# Patient Record
Sex: Male | Born: 1935
Health system: Southern US, Community
[De-identification: ages and names within clinical notes are randomized; demographics above are authoritative.]

## PROBLEM LIST (undated history)

## (undated) DIAGNOSIS — I779 Disorder of arteries and arterioles, unspecified: Secondary | ICD-10-CM

## (undated) DIAGNOSIS — S72009A Fracture of unspecified part of neck of unspecified femur, initial encounter for closed fracture: Secondary | ICD-10-CM

## (undated) DIAGNOSIS — I459 Conduction disorder, unspecified: Secondary | ICD-10-CM

## (undated) DIAGNOSIS — I340 Nonrheumatic mitral (valve) insufficiency: Secondary | ICD-10-CM

## (undated) DIAGNOSIS — I472 Ventricular tachycardia: Secondary | ICD-10-CM

## (undated) DIAGNOSIS — J449 Chronic obstructive pulmonary disease, unspecified: Secondary | ICD-10-CM

## (undated) DIAGNOSIS — R600 Localized edema: Secondary | ICD-10-CM

## (undated) DIAGNOSIS — K922 Gastrointestinal hemorrhage, unspecified: Secondary | ICD-10-CM

## (undated) DIAGNOSIS — I739 Peripheral vascular disease, unspecified: Secondary | ICD-10-CM

## (undated) DIAGNOSIS — D696 Thrombocytopenia, unspecified: Secondary | ICD-10-CM

## (undated) DIAGNOSIS — I4892 Unspecified atrial flutter: Secondary | ICD-10-CM

## (undated) DIAGNOSIS — I10 Essential (primary) hypertension: Secondary | ICD-10-CM

## (undated) DIAGNOSIS — F1010 Alcohol abuse, uncomplicated: Secondary | ICD-10-CM

## (undated) DIAGNOSIS — K219 Gastro-esophageal reflux disease without esophagitis: Secondary | ICD-10-CM

## (undated) DIAGNOSIS — R578 Other shock: Secondary | ICD-10-CM

## (undated) DIAGNOSIS — E875 Hyperkalemia: Secondary | ICD-10-CM

## (undated) DIAGNOSIS — I429 Cardiomyopathy, unspecified: Secondary | ICD-10-CM

## (undated) DIAGNOSIS — B351 Tinea unguium: Secondary | ICD-10-CM

## (undated) DIAGNOSIS — I2510 Atherosclerotic heart disease of native coronary artery without angina pectoris: Secondary | ICD-10-CM

## (undated) DIAGNOSIS — Q809 Congenital ichthyosis, unspecified: Secondary | ICD-10-CM

## (undated) DIAGNOSIS — R591 Generalized enlarged lymph nodes: Secondary | ICD-10-CM

## (undated) DIAGNOSIS — K8050 Calculus of bile duct without cholangitis or cholecystitis without obstruction: Secondary | ICD-10-CM

## (undated) DIAGNOSIS — N1832 Chronic kidney disease, stage 3b: Secondary | ICD-10-CM

## (undated) DIAGNOSIS — Z8719 Personal history of other diseases of the digestive system: Secondary | ICD-10-CM

## (undated) DIAGNOSIS — C61 Malignant neoplasm of prostate: Secondary | ICD-10-CM

## (undated) DIAGNOSIS — I4891 Unspecified atrial fibrillation: Secondary | ICD-10-CM

## (undated) DIAGNOSIS — I350 Nonrheumatic aortic (valve) stenosis: Secondary | ICD-10-CM

## (undated) DIAGNOSIS — I5042 Chronic combined systolic (congestive) and diastolic (congestive) heart failure: Secondary | ICD-10-CM

## (undated) DIAGNOSIS — M7020 Olecranon bursitis, unspecified elbow: Secondary | ICD-10-CM

## (undated) DIAGNOSIS — S72002A Fracture of unspecified part of neck of left femur, initial encounter for closed fracture: Secondary | ICD-10-CM

## (undated) DIAGNOSIS — Z9889 Other specified postprocedural states: Secondary | ICD-10-CM

## (undated) DIAGNOSIS — E785 Hyperlipidemia, unspecified: Secondary | ICD-10-CM

## (undated) DIAGNOSIS — J189 Pneumonia, unspecified organism: Secondary | ICD-10-CM

## (undated) DIAGNOSIS — I639 Cerebral infarction, unspecified: Secondary | ICD-10-CM

## (undated) HISTORY — DX: Atherosclerotic heart disease of native coronary artery without angina pectoris: I25.10

## (undated) HISTORY — DX: Chronic obstructive pulmonary disease, unspecified: J44.9

## (undated) HISTORY — DX: Hypocalcemia: E83.51

## (undated) HISTORY — DX: Calculus of bile duct without cholangitis or cholecystitis without obstruction: K80.50

## (undated) HISTORY — DX: Localized edema: R60.0

## (undated) HISTORY — DX: Chronic kidney disease, stage 3b: N18.32

## (undated) HISTORY — DX: Essential (primary) hypertension: I10

## (undated) HISTORY — DX: Personal history of other diseases of the digestive system: Z87.19

## (undated) HISTORY — DX: Nonrheumatic mitral (valve) insufficiency: I34.0

## (undated) HISTORY — DX: Other specified postprocedural states: Z98.890

## (undated) HISTORY — DX: Peripheral vascular disease, unspecified: I73.9

## (undated) HISTORY — DX: Congenital ichthyosis, unspecified: Q80.9

## (undated) HISTORY — DX: Generalized enlarged lymph nodes: R59.1

## (undated) HISTORY — DX: Malignant neoplasm of prostate: C61

## (undated) HISTORY — DX: Disorder of arteries and arterioles, unspecified: I77.9

## (undated) HISTORY — DX: Olecranon bursitis, unspecified elbow: M70.20

## (undated) HISTORY — DX: Nonrheumatic aortic (valve) stenosis: I35.0

## (undated) HISTORY — PX: COLONOSCOPY: SHX174

## (undated) HISTORY — DX: Tinea unguium: B35.1

## (undated) HISTORY — DX: Hyperkalemia: E87.5

## (undated) HISTORY — DX: Hyperlipidemia, unspecified: E78.5

---

## 1998-12-07 HISTORY — PX: HEMORRHOID SURGERY: SHX153

## 2001-12-07 DIAGNOSIS — Z9889 Other specified postprocedural states: Secondary | ICD-10-CM

## 2001-12-07 HISTORY — DX: Other specified postprocedural states: Z98.890

## 2001-12-07 HISTORY — PX: LUNG SURGERY: SHX703

## 2008-10-10 ENCOUNTER — Ambulatory Visit (HOSPITAL_COMMUNITY): Admission: RE | Admit: 2008-10-10 | Discharge: 2008-10-10 | Payer: Self-pay | Admitting: Urology

## 2008-10-23 ENCOUNTER — Ambulatory Visit: Admission: RE | Admit: 2008-10-23 | Discharge: 2008-11-12 | Payer: Self-pay | Admitting: Radiation Oncology

## 2009-04-01 ENCOUNTER — Encounter: Admission: RE | Admit: 2009-04-01 | Discharge: 2009-04-01 | Payer: Self-pay | Admitting: Family Medicine

## 2009-04-11 ENCOUNTER — Encounter: Payer: Self-pay | Admitting: Cardiovascular Disease

## 2010-01-17 ENCOUNTER — Encounter (HOSPITAL_COMMUNITY): Admission: RE | Admit: 2010-01-17 | Discharge: 2010-03-20 | Payer: Self-pay | Admitting: Urology

## 2010-10-02 ENCOUNTER — Encounter: Admission: RE | Admit: 2010-10-02 | Discharge: 2010-10-02 | Payer: Self-pay | Admitting: Family Medicine

## 2011-02-18 ENCOUNTER — Encounter: Payer: Self-pay | Admitting: Cardiovascular Disease

## 2011-02-18 DIAGNOSIS — I739 Peripheral vascular disease, unspecified: Secondary | ICD-10-CM | POA: Insufficient documentation

## 2011-02-18 DIAGNOSIS — R0989 Other specified symptoms and signs involving the circulatory and respiratory systems: Secondary | ICD-10-CM | POA: Insufficient documentation

## 2011-02-18 HISTORY — DX: Peripheral vascular disease, unspecified: I73.9

## 2011-02-19 ENCOUNTER — Other Ambulatory Visit (HOSPITAL_COMMUNITY): Payer: Self-pay

## 2011-02-24 NOTE — Miscellaneous (Signed)
Summary: Orders Update  Clinical Lists Changes  Problems: Added new problem of OTHER SYMPTOMS INVOLVING CARDIOVASCULAR SYSTEM (ICD-785.9) Orders: Added new Test order of Carotid Duplex (Carotid Duplex) - Signed 

## 2011-02-24 NOTE — Miscellaneous (Signed)
Summary: Orders Update  Clinical Lists Changes  Problems: Added new problem of UNSPECIFIED PERIPHERAL VASCULAR DISEASE (ICD-443.9) Orders: Added new Test order of Arterial Duplex Lower Extremity (Arterial Duplex Low) - Signed 

## 2011-03-06 ENCOUNTER — Other Ambulatory Visit: Payer: Self-pay | Admitting: Family Medicine

## 2011-03-06 ENCOUNTER — Other Ambulatory Visit (HOSPITAL_COMMUNITY): Payer: Self-pay

## 2011-03-06 DIAGNOSIS — I2510 Atherosclerotic heart disease of native coronary artery without angina pectoris: Secondary | ICD-10-CM

## 2011-03-09 ENCOUNTER — Other Ambulatory Visit: Payer: Self-pay | Admitting: Cardiology

## 2011-03-09 ENCOUNTER — Encounter (INDEPENDENT_AMBULATORY_CARE_PROVIDER_SITE_OTHER): Payer: Medicare Other | Admitting: Cardiology

## 2011-03-09 ENCOUNTER — Ambulatory Visit (HOSPITAL_COMMUNITY): Payer: Medicare Other | Attending: Family Medicine

## 2011-03-09 DIAGNOSIS — I2510 Atherosclerotic heart disease of native coronary artery without angina pectoris: Secondary | ICD-10-CM | POA: Insufficient documentation

## 2011-03-09 DIAGNOSIS — R609 Edema, unspecified: Secondary | ICD-10-CM

## 2011-03-09 DIAGNOSIS — I6529 Occlusion and stenosis of unspecified carotid artery: Secondary | ICD-10-CM

## 2011-03-09 DIAGNOSIS — R0989 Other specified symptoms and signs involving the circulatory and respiratory systems: Secondary | ICD-10-CM

## 2011-03-09 DIAGNOSIS — I739 Peripheral vascular disease, unspecified: Secondary | ICD-10-CM

## 2011-03-09 DIAGNOSIS — M79609 Pain in unspecified limb: Secondary | ICD-10-CM

## 2011-03-16 ENCOUNTER — Encounter: Payer: Self-pay | Admitting: Family Medicine

## 2011-04-07 ENCOUNTER — Encounter: Payer: Self-pay | Admitting: Cardiovascular Disease

## 2011-04-07 DIAGNOSIS — B351 Tinea unguium: Secondary | ICD-10-CM | POA: Insufficient documentation

## 2011-04-07 DIAGNOSIS — Z8719 Personal history of other diseases of the digestive system: Secondary | ICD-10-CM | POA: Insufficient documentation

## 2011-04-07 DIAGNOSIS — M7020 Olecranon bursitis, unspecified elbow: Secondary | ICD-10-CM | POA: Insufficient documentation

## 2011-04-07 DIAGNOSIS — E875 Hyperkalemia: Secondary | ICD-10-CM | POA: Insufficient documentation

## 2011-04-07 DIAGNOSIS — C61 Malignant neoplasm of prostate: Secondary | ICD-10-CM | POA: Insufficient documentation

## 2011-04-07 DIAGNOSIS — I10 Essential (primary) hypertension: Secondary | ICD-10-CM | POA: Insufficient documentation

## 2011-04-07 DIAGNOSIS — E785 Hyperlipidemia, unspecified: Secondary | ICD-10-CM | POA: Insufficient documentation

## 2011-04-07 DIAGNOSIS — I340 Nonrheumatic mitral (valve) insufficiency: Secondary | ICD-10-CM | POA: Insufficient documentation

## 2011-04-07 DIAGNOSIS — J449 Chronic obstructive pulmonary disease, unspecified: Secondary | ICD-10-CM | POA: Insufficient documentation

## 2011-04-07 DIAGNOSIS — R600 Localized edema: Secondary | ICD-10-CM | POA: Insufficient documentation

## 2011-04-07 DIAGNOSIS — R188 Other ascites: Secondary | ICD-10-CM | POA: Insufficient documentation

## 2011-04-07 DIAGNOSIS — I2510 Atherosclerotic heart disease of native coronary artery without angina pectoris: Secondary | ICD-10-CM | POA: Insufficient documentation

## 2011-04-07 DIAGNOSIS — R591 Generalized enlarged lymph nodes: Secondary | ICD-10-CM | POA: Insufficient documentation

## 2011-04-07 DIAGNOSIS — Q809 Congenital ichthyosis, unspecified: Secondary | ICD-10-CM | POA: Insufficient documentation

## 2011-04-07 DIAGNOSIS — K8050 Calculus of bile duct without cholangitis or cholecystitis without obstruction: Secondary | ICD-10-CM | POA: Insufficient documentation

## 2011-04-07 DIAGNOSIS — E559 Vitamin D deficiency, unspecified: Secondary | ICD-10-CM | POA: Insufficient documentation

## 2011-04-07 DIAGNOSIS — I350 Nonrheumatic aortic (valve) stenosis: Secondary | ICD-10-CM | POA: Insufficient documentation

## 2011-04-09 ENCOUNTER — Other Ambulatory Visit: Payer: Self-pay | Admitting: Ophthalmology

## 2011-04-09 DIAGNOSIS — H5712 Ocular pain, left eye: Secondary | ICD-10-CM

## 2011-04-14 ENCOUNTER — Ambulatory Visit: Payer: Medicare Other | Admitting: Cardiovascular Disease

## 2011-04-15 ENCOUNTER — Other Ambulatory Visit: Payer: Medicare Other

## 2011-04-21 ENCOUNTER — Other Ambulatory Visit: Payer: Medicare Other

## 2011-04-30 ENCOUNTER — Ambulatory Visit
Admission: RE | Admit: 2011-04-30 | Discharge: 2011-04-30 | Disposition: A | Discharge status: Home / Self Care | Payer: Medicare Other | Source: Ambulatory Visit | Attending: Ophthalmology | Admitting: Ophthalmology

## 2011-04-30 DIAGNOSIS — H5712 Ocular pain, left eye: Secondary | ICD-10-CM

## 2011-04-30 MED ORDER — GADOBENATE DIMEGLUMINE 529 MG/ML IV SOLN
20.0000 mL | Freq: Once | INTRAVENOUS | Status: AC | PRN
  Administered 2011-04-30: 20 mL via INTRAVENOUS

## 2011-05-05 ENCOUNTER — Encounter: Payer: Self-pay | Admitting: Cardiovascular Disease

## 2011-05-05 ENCOUNTER — Ambulatory Visit (INDEPENDENT_AMBULATORY_CARE_PROVIDER_SITE_OTHER): Payer: Medicare Other | Admitting: Cardiovascular Disease

## 2011-05-05 VITALS — BP 112/76 | HR 69 | Ht 72.0 in | Wt 199.6 lb

## 2011-05-05 DIAGNOSIS — R609 Edema, unspecified: Secondary | ICD-10-CM

## 2011-05-05 DIAGNOSIS — I10 Essential (primary) hypertension: Secondary | ICD-10-CM

## 2011-05-05 DIAGNOSIS — R600 Localized edema: Secondary | ICD-10-CM

## 2011-05-05 NOTE — Progress Notes (Signed)
Matthew Mahoney Date of Birth  24-Oct-1936 [facility] / [facility] [address]     [address] [place], [place]  [address] [phone]  Fax  [phone]  History of Present Illness:  Elderly man with history of HTN, smoking, prostate cancer.  Recently developed leg swelling.  Stopped Amoodipine and leg swelling has improved.  He recently had what sounds like an echocardiogram although I am not able to find any results in the computer. He has a history of diastolic dysfunction. He denies any syncope or presyncope.  Current Outpatient Prescriptions on File Prior to Visit  Medication Sig Dispense Refill  . mineral oil-hydrophilic petrolatum (AQUAPHOR) ointment Apply 1 application topically as needed.        . tiotropium (SPIRIVA) 18 MCG inhalation capsule Place 18 mcg into inhaler and inhale every other day.       Marland Kitchen DISCONTD: furosemide (LASIX) 20 MG tablet Take 10 mg by mouth daily.        Marland Kitchen DISCONTD: metoprolol (TOPROL-XL) 50 MG 24 hr tablet Take 50 mg by mouth daily.       Marland Kitchen DISCONTD: olmesartan-hydrochlorothiazide (BENICAR HCT) 40-25 MG per tablet Take 1 tablet by mouth daily.         No Known Allergies  Past Medical History  Diagnosis Date  . Hypertension   . Edema of leg   . COPD (chronic obstructive pulmonary disease)   . Choledocholithiasis   . Coronary arteriosclerosis   . Hyperlipidemia   . Ichthyosis   . Mitral regurgitation   . Onychomycosis   . Vitamin D deficiency   . Aortic stenosis   . Hyperkalemia   . Hypocalcemia   . Lymphadenopathy   . Prostate cancer   . Status post lung surgery 12/07/2001    BENIGHN LESION ON RIGHT LUNG REMOVED  . Ascites     HISTORY  . History of colonic diverticulitis   . Olecranon bursitis     Past Surgical History  Procedure Date  . Hemorrhoid surgery 12/07/1998  . Lung surgery 12/07/2001    BENIGN LESION ON RIGHT LUNG REMOVED  . Colonoscopy     History  Smoking status  . Current Everyday Smoker --  1.5 packs/day  Smokeless tobacco  . Not on file    History  Alcohol Use  . Yes    Family History  Problem Relation Age of Onset  . Hypertension Mother 29  . Hypertension Father 4  . Hypertension Sister   . Hypertension Brother   . Hypertension Sister   . Hypertension Brother   . Hypertension Brother   . Hypertension Brother   . Hypertension Brother     Reviw of Systems:  Reviewed in the HPI.  All other systems are negative.  Physical Exam: BP 112/76  Pulse 69  Ht 6' (1.829 m)  Wt 199 lb 9.6 oz (90.538 kg)  BMI 27.07 kg/m2 The patient is alert and oriented x 3.  The mood and affect are normal.  The skin is warm and dry.  Color is normal.  The HEENT exam reveals that the sclera are nonicteric.  The mucous membranes are moist.  The carotids are 2+ without bruits.  There is no thyromegaly.  There is no JVD.  The lungs are clear.  The chest wall is non tender.  The heart exam reveals a regular rate with a normal S1 and S2.  There are no murmurs, gallops, or rubs.  The PMI is not displaced.  Abdominal exam reveals good bowel sounds.  There is no guarding or rebound.  There is no hepatosplenomegaly or tenderness.  There are no masses.  Exam of the legs reveals 1+ ankle edema.  There are chronic stasis changes.  The distal pulses are intact.  Cranial nerves II - XII are intact.  Motor and sensory functions are intact.  The gait is normal.  ECG: Normal sinus rhythm with first-degree AV block. He has an incomplete right bundle branch block with a left anterior fascicular block.  Assessment / Plan:

## 2011-05-05 NOTE — Assessment & Plan Note (Signed)
His blood pressure seems to be well-controlled. I think a tilt a lot better off amlodipine.  I suspect that he has some right-sided heart failure because of his use of cigarette smoking and COPD. We'll continue on his current medications.

## 2011-05-05 NOTE — Assessment & Plan Note (Signed)
We will try to locate The echocardiogram.

## 2011-05-05 NOTE — Patient Instructions (Signed)
Pt is OK to exercise at the [facility].

## 2013-01-20 ENCOUNTER — Emergency Department (HOSPITAL_COMMUNITY): Payer: Medicare Other

## 2013-01-20 ENCOUNTER — Inpatient Hospital Stay (HOSPITAL_COMMUNITY)
Admission: EM | Admit: 2013-01-20 | Discharge: 2013-01-24 | DRG: 481 | Disposition: A | Discharge status: Skilled Nursing Facility | Payer: Medicare Other | Attending: Internal Medicine | Admitting: Internal Medicine

## 2013-01-20 ENCOUNTER — Encounter (HOSPITAL_COMMUNITY): Payer: Self-pay | Admitting: Emergency Medicine

## 2013-01-20 DIAGNOSIS — I340 Nonrheumatic mitral (valve) insufficiency: Secondary | ICD-10-CM | POA: Diagnosis present

## 2013-01-20 DIAGNOSIS — Z79899 Other long term (current) drug therapy: Secondary | ICD-10-CM

## 2013-01-20 DIAGNOSIS — I2510 Atherosclerotic heart disease of native coronary artery without angina pectoris: Secondary | ICD-10-CM | POA: Diagnosis present

## 2013-01-20 DIAGNOSIS — W19XXXA Unspecified fall, initial encounter: Secondary | ICD-10-CM

## 2013-01-20 DIAGNOSIS — S72143A Displaced intertrochanteric fracture of unspecified femur, initial encounter for closed fracture: Principal | ICD-10-CM | POA: Diagnosis present

## 2013-01-20 DIAGNOSIS — Z791 Long term (current) use of non-steroidal anti-inflammatories (NSAID): Secondary | ICD-10-CM

## 2013-01-20 DIAGNOSIS — E559 Vitamin D deficiency, unspecified: Secondary | ICD-10-CM | POA: Diagnosis present

## 2013-01-20 DIAGNOSIS — E871 Hypo-osmolality and hyponatremia: Secondary | ICD-10-CM | POA: Diagnosis present

## 2013-01-20 DIAGNOSIS — D649 Anemia, unspecified: Secondary | ICD-10-CM | POA: Diagnosis not present

## 2013-01-20 DIAGNOSIS — I739 Peripheral vascular disease, unspecified: Secondary | ICD-10-CM | POA: Diagnosis present

## 2013-01-20 DIAGNOSIS — J4489 Other specified chronic obstructive pulmonary disease: Secondary | ICD-10-CM | POA: Diagnosis present

## 2013-01-20 DIAGNOSIS — Y92009 Unspecified place in unspecified non-institutional (private) residence as the place of occurrence of the external cause: Secondary | ICD-10-CM

## 2013-01-20 DIAGNOSIS — I10 Essential (primary) hypertension: Secondary | ICD-10-CM | POA: Diagnosis present

## 2013-01-20 DIAGNOSIS — F1010 Alcohol abuse, uncomplicated: Secondary | ICD-10-CM

## 2013-01-20 DIAGNOSIS — N400 Enlarged prostate without lower urinary tract symptoms: Secondary | ICD-10-CM | POA: Diagnosis present

## 2013-01-20 DIAGNOSIS — S72142A Displaced intertrochanteric fracture of left femur, initial encounter for closed fracture: Secondary | ICD-10-CM

## 2013-01-20 DIAGNOSIS — J449 Chronic obstructive pulmonary disease, unspecified: Secondary | ICD-10-CM | POA: Diagnosis present

## 2013-01-20 DIAGNOSIS — Z720 Tobacco use: Secondary | ICD-10-CM

## 2013-01-20 DIAGNOSIS — W010XXA Fall on same level from slipping, tripping and stumbling without subsequent striking against object, initial encounter: Secondary | ICD-10-CM | POA: Diagnosis present

## 2013-01-20 DIAGNOSIS — S72002A Fracture of unspecified part of neck of left femur, initial encounter for closed fracture: Secondary | ICD-10-CM

## 2013-01-20 DIAGNOSIS — F17200 Nicotine dependence, unspecified, uncomplicated: Secondary | ICD-10-CM | POA: Diagnosis present

## 2013-01-20 DIAGNOSIS — I350 Nonrheumatic aortic (valve) stenosis: Secondary | ICD-10-CM | POA: Diagnosis present

## 2013-01-20 DIAGNOSIS — E785 Hyperlipidemia, unspecified: Secondary | ICD-10-CM | POA: Diagnosis present

## 2013-01-20 LAB — BASIC METABOLIC PANEL
CO2: 22 mEq/L (ref 19–32)
Calcium: 9.8 mg/dL (ref 8.4–10.5)
Glucose, Bld: 106 mg/dL — ABNORMAL HIGH (ref 70–99)
Potassium: 3.9 mEq/L (ref 3.5–5.1)
Sodium: 129 mEq/L — ABNORMAL LOW (ref 135–145)

## 2013-01-20 LAB — URINALYSIS, ROUTINE W REFLEX MICROSCOPIC
Glucose, UA: NEGATIVE mg/dL
Hgb urine dipstick: NEGATIVE
Specific Gravity, Urine: 1.009 (ref 1.005–1.030)

## 2013-01-20 LAB — CBC WITH DIFFERENTIAL/PLATELET
Eosinophils Relative: 1 % (ref 0–5)
HCT: 36.4 % — ABNORMAL LOW (ref 39.0–52.0)
Hemoglobin: 12.7 g/dL — ABNORMAL LOW (ref 13.0–17.0)
Lymphocytes Relative: 14 % (ref 12–46)
Lymphs Abs: 1.6 10*3/uL (ref 0.7–4.0)
MCV: 93.8 fL (ref 78.0–100.0)
Platelets: 245 10*3/uL (ref 150–400)
RBC: 3.88 MIL/uL — ABNORMAL LOW (ref 4.22–5.81)
WBC: 11.2 10*3/uL — ABNORMAL HIGH (ref 4.0–10.5)

## 2013-01-20 LAB — ABO/RH: ABO/RH(D): O POS

## 2013-01-20 LAB — TYPE AND SCREEN: Antibody Screen: NEGATIVE

## 2013-01-20 NOTE — ED Notes (Signed)
Pt was getting out of car and slipped on ice and landed on left hip. C/o left hip pain, denies pain elsewhere, hip pain only with weight bearing.  Pt taking Plavix, did not hit head.

## 2013-01-20 NOTE — ED Notes (Signed)
Bedside report received from previous RN 

## 2013-01-20 NOTE — ED Provider Notes (Signed)
History     CSN: [id number]  Arrival date & time 01/20/13  1844   First MD Initiated Contact with Patient 01/20/13 2023      Chief Complaint  Patient presents with  . Fall  . Hip Pain    (Consider location/radiation/quality/duration/timing/severity/associated sxs/prior treatment) Patient is a 77 y.o. male presenting with fall and hip pain. The history is provided by the patient.  Fall  Hip Pain  He slipped on ice and fell injuring his left thigh. He states it doesn't hurt if he doesn't move it but he rates his pain at 10/10 if she tries to move it her stand on it. He denies head, neck, back injury. However, he is on Plavix because of cardiac stents.  Past Medical History  Diagnosis Date  . Hypertension   . Edema of leg   . COPD (chronic obstructive pulmonary disease)   . Choledocholithiasis   . Coronary arteriosclerosis   . Hyperlipidemia   . Ichthyosis   . Mitral regurgitation   . Onychomycosis   . Vitamin D deficiency   . Aortic stenosis   . Hyperkalemia   . Hypocalcemia   . Lymphadenopathy   . Prostate cancer   . Status post lung surgery 12/07/2001    BENIGHN LESION ON RIGHT LUNG REMOVED  . Ascites     HISTORY  . History of colonic diverticulitis   . Olecranon bursitis     Past Surgical History  Procedure Laterality Date  . Hemorrhoid surgery  12/07/1998  . Lung surgery  12/07/2001    BENIGN LESION ON RIGHT LUNG REMOVED  . Colonoscopy      Family History  Problem Relation Age of Onset  . Hypertension Mother 68  . Hypertension Father 28  . Hypertension Sister   . Hypertension Brother   . Hypertension Sister   . Hypertension Brother   . Hypertension Brother   . Hypertension Brother   . Hypertension Brother     History  Substance Use Topics  . Smoking status: Current Every Day Smoker -- 1.50 packs/day  . Smokeless tobacco: Not on file  . Alcohol Use: Yes      Review of Systems  All other systems reviewed and are negative.    Allergies   Review of patient's allergies indicates no known allergies.  Home Medications   Current Outpatient Rx  Name  Route  Sig  Dispense  Refill  . bicalutamide (CASODEX) 50 MG tablet   Oral   Take 50 mg by mouth daily.         . clopidogrel (PLAVIX) 75 MG tablet   Oral   Take 75 mg by mouth daily.           . fish oil-omega-3 fatty acids 1000 MG capsule   Oral   Take 1 g by mouth 2 (two) times daily.         Marland Kitchen ibuprofen (ADVIL) 200 MG tablet   Oral   Take 200 mg by mouth daily.           . metoprolol succinate (TOPROL-XL) 25 MG 24 hr tablet   Oral   Take 25 mg by mouth daily.          Marland Kitchen olmesartan-hydrochlorothiazide (BENICAR HCT) 40-12.5 MG per tablet   Oral   Take 1 tablet by mouth daily.             BP 166/74  Pulse 60  Temp(Src) 98 F (36.7 C) (Oral)  Resp 18  SpO2  100%  Physical Exam  Nursing note and vitals reviewed.  77 year old male, resting comfortably and in no acute distress. Vital signs are significant for hypertension with blood pressure 166/74. Oxygen saturation is 100%, which is normal. Head is normocephalic and atraumatic. PERRLA, EOMI. Oropharynx is clear. Neck is nontender and supple without adenopathy or JVD. Back is nontender and there is no CVA tenderness. Lungs are clear without rales, wheezes, or rhonchi. Chest is nontender. Heart has regular rate and rhythm without murmur. Abdomen is soft, flat, nontender without masses or hepatosplenomegaly and peristalsis is normoactive. Extremities: Left leg is shortened and externally rotated. There is tenderness palpation of the left hip and marked pain with any movement of the left hip. Distal neurovascular exam is intact. Venous stasis changes with thickening of skin is present bilaterally. Skin is warm and dry without rash. Neurologic: Mental status is normal, cranial nerves are intact, there are no motor or sensory deficits.  ED Course  Procedures (including critical care time)  Results  for orders placed during the hospital encounter of 01/20/13  CBC WITH DIFFERENTIAL      Result Value Range   WBC 11.2 (*) 4.0 - 10.5 K/uL   RBC 3.88 (*) 4.22 - 5.81 MIL/uL   Hemoglobin 12.7 (*) 13.0 - 17.0 g/dL   HCT 11.9 (*) 14.7 - 82.9 %   MCV 93.8  78.0 - 100.0 fL   MCH 32.7  26.0 - 34.0 pg   MCHC 34.9  30.0 - 36.0 g/dL   RDW 56.2  13.0 - 86.5 %   Platelets 245  150 - 400 K/uL   Neutrophils Relative 76  43 - 77 %   Neutro Abs 8.5 (*) 1.7 - 7.7 K/uL   Lymphocytes Relative 14  12 - 46 %   Lymphs Abs 1.6  0.7 - 4.0 K/uL   Monocytes Relative 7  3 - 12 %   Monocytes Absolute 0.8  0.1 - 1.0 K/uL   Eosinophils Relative 1  0 - 5 %   Eosinophils Absolute 0.2  0.0 - 0.7 K/uL   Basophils Relative 1  0 - 1 %   Basophils Absolute 0.1  0.0 - 0.1 K/uL  BASIC METABOLIC PANEL      Result Value Range   Sodium 129 (*) 135 - 145 mEq/L   Potassium 3.9  3.5 - 5.1 mEq/L   Chloride 91 (*) 96 - 112 mEq/L   CO2 22  19 - 32 mEq/L   Glucose, Bld 106 (*) 70 - 99 mg/dL   BUN 25 (*) 6 - 23 mg/dL   Creatinine, Ser 7.84  0.50 - 1.35 mg/dL   Calcium 9.8  8.4 - 69.6 mg/dL   GFR calc non Af Amer 56 (*) >90 mL/min   GFR calc Af Amer 65 (*) >90 mL/min  URINALYSIS, ROUTINE W REFLEX MICROSCOPIC      Result Value Range   Color, Urine YELLOW  YELLOW   APPearance CLEAR  CLEAR   Specific Gravity, Urine 1.009  1.005 - 1.030   pH 6.5  5.0 - 8.0   Glucose, UA NEGATIVE  NEGATIVE mg/dL   Hgb urine dipstick NEGATIVE  NEGATIVE   Bilirubin Urine NEGATIVE  NEGATIVE   Ketones, ur TRACE (*) NEGATIVE mg/dL   Protein, ur NEGATIVE  NEGATIVE mg/dL   Urobilinogen, UA 0.2  0.0 - 1.0 mg/dL   Nitrite NEGATIVE  NEGATIVE   Leukocytes, UA NEGATIVE  NEGATIVE  TYPE AND SCREEN  Result Value Range   ABO/RH(D) O POS     Antibody Screen NEG     Sample Expiration 01/23/2013    ABO/RH      Result Value Range   ABO/RH(D) O POS     Dg Hip Complete Left  01/20/2013  *RADIOLOGY REPORT*  Clinical Data: Fall, left hip pain.  LEFT  HIP - COMPLETE 2+ VIEW  Comparison: None.  Findings: There is a left femoral intertrochanteric fracture.  No significant angulation.  Minimal displacement on the cross-table lateral view.  No subluxation or dislocation.  Moderate degenerative changes in the hips bilaterally.  IMPRESSION: Left femoral intertrochanteric fracture.   Original Report Authenticated By: Charlett Nose, M.D.    Ct Head Wo Contrast  01/20/2013  *RADIOLOGY REPORT*  Clinical Data: Status post fall.  CT HEAD WITHOUT CONTRAST  Technique:  Contiguous axial images were obtained from the base of the skull through the vertex without contrast.  Comparison: 04/30/2011  Findings: There is diffuse patchy low density throughout the subcortical and periventricular white matter consistent with chronic small vessel ischemic change.  There is prominence of the sulci and ventricles consistent with brain atrophy.  There is no evidence for acute brain infarct, hemorrhage or mass.  There is mild mucosal thickening involving the left maxillary sinus.  The mastoid air cells are clear.  The skull is intact.  IMPRESSION:  1.  No acute intracranial abnormalities. 2.  Small vessel ischemic disease and brain atrophy.   Original Report Authenticated By: Signa Kell, M.D.    Dg Chest Portable 1 View  01/20/2013  *RADIOLOGY REPORT*  Clinical Data: Fall, hip pain, hip fracture.  PORTABLE CHEST - 1 VIEW  Comparison: None.  Findings: The heart is mildly enlarged.  Densities in the lung bases bilaterally, right greater than left.  I suspect this represents scarring or atelectasis.  Recommend clinical correlation to exclude pneumonia.  No effusions.  No acute bony abnormality.  IMPRESSION: Bibasilar opacities, right greater than left, suspect atelectasis or scarring.  Recommend clinical correlation to exclude infection.   Original Report Authenticated By: Charlett Nose, M.D.     Images viewed by me.   Date: 01/20/2013  Rate: 68  Rhythm: normal sinus rhythm and  premature ventricular contractions (PVC)  QRS Axis: left  Intervals: PR prolonged  ST/T Wave abnormalities: normal  Conduction Disutrbances:first-degree A-V block , left anterior fascicular block and Incomplete right bundle-branch block  Narrative Interpretation: First degree AV block, incomplete right bundle-branch block, left anterior fascicular block, PVCs. No prior ECG available for comparison.  Old EKG Reviewed: none available     1. Intertrochanteric fracture of left hip   2. Hyponatremia       MDM  Fall with left hip injury. X-ray confirms intertrochanteric fracture. Screening labs will be obtained. Because of taking Plavix, CT of the head will be obtained.  Head CT is unremarkable. Case is discussed with Dr. Jerl Santos who states he'll see the patient in consultation and anticipates operating on him tomorrow afternoon. Case is discussed with Dr. Onalee Hua of [facility] who agrees to admit the patient.  Dione Booze, MD 01/21/13 [phone]

## 2013-01-21 ENCOUNTER — Inpatient Hospital Stay (HOSPITAL_COMMUNITY): Payer: Medicare Other

## 2013-01-21 ENCOUNTER — Inpatient Hospital Stay (HOSPITAL_COMMUNITY): Payer: Medicare Other | Admitting: Anesthesiology

## 2013-01-21 ENCOUNTER — Encounter (HOSPITAL_COMMUNITY): Payer: Self-pay | Admitting: Anesthesiology

## 2013-01-21 ENCOUNTER — Encounter (HOSPITAL_COMMUNITY)
Admission: EM | Disposition: A | Discharge status: Skilled Nursing Facility | Payer: Self-pay | Source: Home / Self Care | Attending: Internal Medicine

## 2013-01-21 DIAGNOSIS — Z720 Tobacco use: Secondary | ICD-10-CM | POA: Diagnosis present

## 2013-01-21 DIAGNOSIS — S72009A Fracture of unspecified part of neck of unspecified femur, initial encounter for closed fracture: Secondary | ICD-10-CM

## 2013-01-21 DIAGNOSIS — W19XXXA Unspecified fall, initial encounter: Secondary | ICD-10-CM

## 2013-01-21 DIAGNOSIS — S72143A Displaced intertrochanteric fracture of unspecified femur, initial encounter for closed fracture: Principal | ICD-10-CM

## 2013-01-21 DIAGNOSIS — J449 Chronic obstructive pulmonary disease, unspecified: Secondary | ICD-10-CM

## 2013-01-21 DIAGNOSIS — I2510 Atherosclerotic heart disease of native coronary artery without angina pectoris: Secondary | ICD-10-CM

## 2013-01-21 DIAGNOSIS — I359 Nonrheumatic aortic valve disorder, unspecified: Secondary | ICD-10-CM

## 2013-01-21 DIAGNOSIS — S72002A Fracture of unspecified part of neck of left femur, initial encounter for closed fracture: Secondary | ICD-10-CM

## 2013-01-21 DIAGNOSIS — F1010 Alcohol abuse, uncomplicated: Secondary | ICD-10-CM

## 2013-01-21 DIAGNOSIS — E871 Hypo-osmolality and hyponatremia: Secondary | ICD-10-CM | POA: Diagnosis present

## 2013-01-21 DIAGNOSIS — Y92009 Unspecified place in unspecified non-institutional (private) residence as the place of occurrence of the external cause: Secondary | ICD-10-CM

## 2013-01-21 HISTORY — PX: FEMUR IM NAIL: SHX1597

## 2013-01-21 HISTORY — DX: Alcohol abuse, uncomplicated: F10.10

## 2013-01-21 HISTORY — DX: Fracture of unspecified part of neck of left femur, initial encounter for closed fracture: S72.002A

## 2013-01-21 LAB — BASIC METABOLIC PANEL
BUN: 21 mg/dL (ref 6–23)
GFR calc Af Amer: 74 mL/min — ABNORMAL LOW (ref >90)
GFR calc non Af Amer: 63 mL/min — ABNORMAL LOW (ref >90)
Potassium: 4.3 mEq/L (ref 3.5–5.1)

## 2013-01-21 LAB — CBC
HCT: 35.1 % — ABNORMAL LOW (ref 39.0–52.0)
MCHC: 34.8 g/dL (ref 30.0–36.0)
Platelets: 229 10*3/uL (ref 150–400)
RDW: 13.4 % (ref 11.5–15.5)

## 2013-01-21 LAB — PROTIME-INR
INR: 0.87 (ref 0.00–1.49)
Prothrombin Time: 11.8 seconds (ref 11.6–15.2)

## 2013-01-21 LAB — HEPATIC FUNCTION PANEL
ALT: 10 U/L (ref 0–53)
Alkaline Phosphatase: 59 U/L (ref 39–117)
Bilirubin, Direct: 0.2 mg/dL (ref 0.0–0.3)
Indirect Bilirubin: 0.4 mg/dL (ref 0.3–0.9)

## 2013-01-21 LAB — SURGICAL PCR SCREEN
MRSA, PCR: INVALID — AB
Staphylococcus aureus: INVALID — AB

## 2013-01-21 LAB — SODIUM, URINE, RANDOM: Sodium, Ur: 165 mEq/L

## 2013-01-21 SURGERY — INSERTION, INTRAMEDULLARY ROD, FEMUR
Anesthesia: General | Site: Hip | Laterality: Left | Wound class: Clean

## 2013-01-21 MED ORDER — LORAZEPAM 1 MG PO TABS: 0.0000 mg | ORAL_TABLET | Freq: Four times a day (QID) | ORAL | Status: AC

## 2013-01-21 MED ORDER — ONDANSETRON HCL 4 MG/2ML IJ SOLN: 4.0000 mg | Freq: Four times a day (QID) | INTRAMUSCULAR | Status: DC | PRN

## 2013-01-21 MED ORDER — SODIUM CHLORIDE 0.9 % IV SOLN: INTRAVENOUS | Status: DC

## 2013-01-21 MED ORDER — ADULT MULTIVITAMIN W/MINERALS CH
1.0000 | ORAL_TABLET | Freq: Every day | ORAL | Status: DC
  Administered 2013-01-22 – 2013-01-24 (×3): 1 via ORAL
  Filled 2013-01-21 (×4): qty 1

## 2013-01-21 MED ORDER — FOLIC ACID 1 MG PO TABS
1.0000 mg | ORAL_TABLET | Freq: Every day | ORAL | Status: DC
  Administered 2013-01-22 – 2013-01-24 (×3): 1 mg via ORAL
  Filled 2013-01-21 (×4): qty 1

## 2013-01-21 MED ORDER — HYDROCODONE-ACETAMINOPHEN 5-325 MG PO TABS
1.0000 | ORAL_TABLET | Freq: Four times a day (QID) | ORAL | Status: DC | PRN
  Administered 2013-01-22 – 2013-01-24 (×2): 1 via ORAL
  Filled 2013-01-21 (×2): qty 1

## 2013-01-21 MED ORDER — DEXAMETHASONE SODIUM PHOSPHATE 10 MG/ML IJ SOLN
INTRAMUSCULAR | Status: DC | PRN
  Administered 2013-01-21: 10 mg via INTRAVENOUS

## 2013-01-21 MED ORDER — LORAZEPAM 1 MG PO TABS: 0.0000 mg | ORAL_TABLET | Freq: Two times a day (BID) | ORAL | Status: DC

## 2013-01-21 MED ORDER — POTASSIUM CHLORIDE IN NACL 20-0.9 MEQ/L-% IV SOLN
INTRAVENOUS | Status: DC
  Administered 2013-01-21: 02:00:00 via INTRAVENOUS
  Filled 2013-01-21: qty 1000

## 2013-01-21 MED ORDER — FENTANYL CITRATE 0.05 MG/ML IJ SOLN
INTRAMUSCULAR | Status: DC | PRN
  Administered 2013-01-21: 50 ug via INTRAVENOUS

## 2013-01-21 MED ORDER — PROPOFOL 10 MG/ML IV BOLUS
INTRAVENOUS | Status: DC | PRN
  Administered 2013-01-21: 150 mg via INTRAVENOUS

## 2013-01-21 MED ORDER — METOPROLOL SUCCINATE ER 25 MG PO TB24
25.0000 mg | ORAL_TABLET | Freq: Every day | ORAL | Status: DC
  Administered 2013-01-21 – 2013-01-24 (×4): 25 mg via ORAL
  Filled 2013-01-21 (×4): qty 1

## 2013-01-21 MED ORDER — 0.9 % SODIUM CHLORIDE (POUR BTL) OPTIME
TOPICAL | Status: DC | PRN
  Administered 2013-01-21: 1000 mL

## 2013-01-21 MED ORDER — MORPHINE SULFATE 2 MG/ML IJ SOLN: 0.5000 mg | INTRAMUSCULAR | Status: DC | PRN

## 2013-01-21 MED ORDER — HYDROMORPHONE HCL PF 1 MG/ML IJ SOLN: 0.2500 mg | INTRAMUSCULAR | Status: DC | PRN

## 2013-01-21 MED ORDER — GLYCOPYRROLATE 0.2 MG/ML IJ SOLN
INTRAMUSCULAR | Status: DC | PRN
  Administered 2013-01-21: 0.4 mg via INTRAVENOUS

## 2013-01-21 MED ORDER — EPHEDRINE SULFATE 50 MG/ML IJ SOLN
INTRAMUSCULAR | Status: DC | PRN
  Administered 2013-01-21: 10 mg via INTRAVENOUS

## 2013-01-21 MED ORDER — LORAZEPAM 2 MG/ML IJ SOLN: 1.0000 mg | Freq: Four times a day (QID) | INTRAMUSCULAR | Status: AC | PRN

## 2013-01-21 MED ORDER — CEFAZOLIN SODIUM-DEXTROSE 2-3 GM-% IV SOLR
2.0000 g | Freq: Four times a day (QID) | INTRAVENOUS | Status: AC
Start: 2013-01-22 — End: 2013-01-22
  Administered 2013-01-21 – 2013-01-22 (×2): 2 g via INTRAVENOUS
  Filled 2013-01-21 (×2): qty 50

## 2013-01-21 MED ORDER — LACTATED RINGERS IV SOLN: INTRAVENOUS | Status: DC

## 2013-01-21 MED ORDER — SODIUM CHLORIDE 0.9 % IV SOLN
INTRAVENOUS | Status: DC | PRN
  Administered 2013-01-21: 17:00:00 via INTRAVENOUS

## 2013-01-21 MED ORDER — DOCUSATE SODIUM 100 MG PO CAPS
100.0000 mg | ORAL_CAPSULE | Freq: Two times a day (BID) | ORAL | Status: DC
  Administered 2013-01-21 – 2013-01-24 (×6): 100 mg via ORAL
  Filled 2013-01-21 (×8): qty 1

## 2013-01-21 MED ORDER — VITAMIN B-1 100 MG PO TABS
100.0000 mg | ORAL_TABLET | Freq: Every day | ORAL | Status: DC
  Administered 2013-01-22 – 2013-01-24 (×3): 100 mg via ORAL
  Filled 2013-01-21 (×5): qty 1

## 2013-01-21 MED ORDER — POTASSIUM CHLORIDE IN NACL 20-0.9 MEQ/L-% IV SOLN
INTRAVENOUS | Status: AC
  Administered 2013-01-21 (×2): via INTRAVENOUS
  Filled 2013-01-21 (×2): qty 1000

## 2013-01-21 MED ORDER — ONDANSETRON HCL 4 MG/2ML IJ SOLN: 4.0000 mg | Freq: Three times a day (TID) | INTRAMUSCULAR | Status: AC | PRN

## 2013-01-21 MED ORDER — ROCURONIUM BROMIDE 100 MG/10ML IV SOLN
INTRAVENOUS | Status: DC | PRN
  Administered 2013-01-21: 20 mg via INTRAVENOUS

## 2013-01-21 MED ORDER — METOCLOPRAMIDE HCL 10 MG PO TABS: 5.0000 mg | ORAL_TABLET | Freq: Three times a day (TID) | ORAL | Status: DC | PRN

## 2013-01-21 MED ORDER — THIAMINE HCL 100 MG/ML IJ SOLN
100.0000 mg | Freq: Once | INTRAMUSCULAR | Status: DC
  Filled 2013-01-21: qty 1

## 2013-01-21 MED ORDER — LIDOCAINE HCL (CARDIAC) 20 MG/ML IV SOLN
INTRAVENOUS | Status: DC | PRN
  Administered 2013-01-21: 100 mg via INTRAVENOUS

## 2013-01-21 MED ORDER — HYDROMORPHONE HCL PF 1 MG/ML IJ SOLN: 1.0000 mg | INTRAMUSCULAR | Status: AC | PRN

## 2013-01-21 MED ORDER — CEFAZOLIN SODIUM-DEXTROSE 2-3 GM-% IV SOLR
INTRAVENOUS | Status: DC | PRN
  Administered 2013-01-21: 2 g via INTRAVENOUS

## 2013-01-21 MED ORDER — LORAZEPAM 1 MG PO TABS
1.0000 mg | ORAL_TABLET | Freq: Four times a day (QID) | ORAL | Status: AC | PRN
  Administered 2013-01-22 – 2013-01-24 (×2): 1 mg via ORAL
  Filled 2013-01-21 (×2): qty 1

## 2013-01-21 MED ORDER — METOCLOPRAMIDE HCL 5 MG/ML IJ SOLN: 5.0000 mg | Freq: Three times a day (TID) | INTRAMUSCULAR | Status: DC | PRN

## 2013-01-21 MED ORDER — ONDANSETRON HCL 4 MG/2ML IJ SOLN
INTRAMUSCULAR | Status: DC | PRN
  Administered 2013-01-21: 4 mg via INTRAVENOUS

## 2013-01-21 MED ORDER — CLOPIDOGREL BISULFATE 75 MG PO TABS
75.0000 mg | ORAL_TABLET | Freq: Every day | ORAL | Status: DC
  Administered 2013-01-21 – 2013-01-24 (×4): 75 mg via ORAL
  Filled 2013-01-21 (×6): qty 1

## 2013-01-21 MED ORDER — THIAMINE HCL 100 MG/ML IJ SOLN
100.0000 mg | Freq: Every day | INTRAMUSCULAR | Status: DC
  Administered 2013-01-21: 100 mg via INTRAVENOUS
  Filled 2013-01-21 (×4): qty 1

## 2013-01-21 MED ORDER — PROMETHAZINE HCL 25 MG/ML IJ SOLN: 6.2500 mg | INTRAMUSCULAR | Status: DC | PRN

## 2013-01-21 MED ORDER — NEOSTIGMINE METHYLSULFATE 1 MG/ML IJ SOLN
INTRAMUSCULAR | Status: DC | PRN
  Administered 2013-01-21: 3 mg via INTRAVENOUS

## 2013-01-21 MED ORDER — ONDANSETRON HCL 4 MG PO TABS: 4.0000 mg | ORAL_TABLET | Freq: Four times a day (QID) | ORAL | Status: DC | PRN

## 2013-01-21 MED ORDER — SUCCINYLCHOLINE CHLORIDE 20 MG/ML IJ SOLN
INTRAMUSCULAR | Status: DC | PRN
  Administered 2013-01-21: 100 mg via INTRAVENOUS

## 2013-01-21 MED ORDER — PHENYLEPHRINE HCL 10 MG/ML IJ SOLN
INTRAMUSCULAR | Status: DC | PRN
  Administered 2013-01-21: 80 ug via INTRAVENOUS

## 2013-01-21 MED ORDER — BISACODYL 5 MG PO TBEC
5.0000 mg | DELAYED_RELEASE_TABLET | Freq: Every day | ORAL | Status: DC | PRN
  Filled 2013-01-21: qty 1

## 2013-01-21 SURGICAL SUPPLY — 43 items
BAG ZIPLOCK 12X15 (MISCELLANEOUS) ×3 IMPLANT
BLADE SURG 15 STRL LF DISP TIS (BLADE) ×2 IMPLANT
BLADE SURG 15 STRL SS (BLADE) ×1
CANISTER SUCTION 2500CC (MISCELLANEOUS) ×3 IMPLANT
CLOTH BEACON ORANGE TIMEOUT ST (SAFETY) ×3 IMPLANT
DRAPE STERI IOBAN 125X83 (DRAPES) ×3 IMPLANT
DRSG ADAPTIC 3X8 NADH LF (GAUZE/BANDAGES/DRESSINGS) ×3 IMPLANT
DRSG MEPILEX BORDER 4X4 (GAUZE/BANDAGES/DRESSINGS) ×3 IMPLANT
DRSG MEPILEX BORDER 4X8 (GAUZE/BANDAGES/DRESSINGS) ×3 IMPLANT
DRSG PAD ABDOMINAL 8X10 ST (GAUZE/BANDAGES/DRESSINGS) ×3 IMPLANT
DURAPREP 26ML APPLICATOR (WOUND CARE) ×3 IMPLANT
ELECT CAUTERY BLADE 6.4 (BLADE) ×3 IMPLANT
ELECT REM PT RETURN 9FT ADLT (ELECTROSURGICAL) ×3
ELECTRODE REM PT RTRN 9FT ADLT (ELECTROSURGICAL) ×2 IMPLANT
EVACUATOR 1/8 PVC DRAIN (DRAIN) IMPLANT
GLOVE BIO SURGEON STRL SZ8.5 (GLOVE) ×3 IMPLANT
GLOVE BIOGEL M 8.0 STRL (GLOVE) ×3 IMPLANT
GLOVE BIOGEL PI IND STRL 8 (GLOVE) ×2 IMPLANT
GLOVE BIOGEL PI IND STRL 8.5 (GLOVE) ×2 IMPLANT
GLOVE BIOGEL PI INDICATOR 8 (GLOVE) ×1
GLOVE BIOGEL PI INDICATOR 8.5 (GLOVE) ×1
GLOVE ECLIPSE 8.5 STRL (GLOVE) ×3 IMPLANT
GOWN STRL NON-REIN LRG LVL3 (GOWN DISPOSABLE) ×3 IMPLANT
GUIDEPIN 3.2X17.5 THRD DISP (PIN) ×3 IMPLANT
GUIDEWIRE BALL NOSE 100CM (WIRE) ×3 IMPLANT
HIP FRAC NAIL LAG SCR 10.5X100 (Orthopedic Implant) ×1 IMPLANT
KIT BASIN OR (CUSTOM PROCEDURE TRAY) ×3 IMPLANT
MANIFOLD NEPTUNE II (INSTRUMENTS) ×3 IMPLANT
NAIL HIP FRACT LT 130D 11X400 (Nail) ×3 IMPLANT
NS IRRIG 1000ML POUR BTL (IV SOLUTION) ×3 IMPLANT
PACK GENERAL/GYN (CUSTOM PROCEDURE TRAY) ×3 IMPLANT
POSITIONER SURGICAL ARM (MISCELLANEOUS) ×9 IMPLANT
SCREW CANN THRD AFF 10.5X100 (Orthopedic Implant) ×2 IMPLANT
SPONGE GAUZE 4X4 12PLY (GAUZE/BANDAGES/DRESSINGS) ×3 IMPLANT
SPONGE LAP 4X18 X RAY DECT (DISPOSABLE) ×3 IMPLANT
STAPLER VISISTAT (STAPLE) ×3 IMPLANT
STAPLER VISISTAT 35W (STAPLE) ×3 IMPLANT
SUT VIC AB 0 CT1 27 (SUTURE) ×2
SUT VIC AB 0 CT1 27XBRD ANTBC (SUTURE) ×4 IMPLANT
SUT VIC AB 1 CT1 36 (SUTURE) ×6 IMPLANT
SUT VIC AB 2-0 CT1 27 (SUTURE) ×2
SUT VIC AB 2-0 CT1 TAPERPNT 27 (SUTURE) ×4 IMPLANT
WATER STERILE IRR 1500ML POUR (IV SOLUTION) ×3 IMPLANT

## 2013-01-21 NOTE — Brief Op Note (Signed)
Matthew Mahoney [id number] 01/21/2013   PRE-OP DIAGNOSIS: left intertroc hip fracture  POST-OP DIAGNOSIS: same  PROCEDURE: left hip troch nail  ANESTHESIA: general  [name]   Dictation #:  [id number]   Will use ASA 325 BID for DVT prophylaxis for 4 weeks May be PWB with PT

## 2013-01-21 NOTE — ED Notes (Signed)
Report called to floor RN

## 2013-01-21 NOTE — Progress Notes (Addendum)
[facility] PROGRESS NOTE  Matthew Mahoney WUJ:[id number] DOB: 09-Feb-1936 DOA: 01/20/2013  PCP: Used to be Dr. Duanne Guess and now it's Dr. Mardelle Matte?? Cards: Dr. Elease Hashimoto Urology: Dr. Isabel Caprice  Brief HPI: 77 yo male was walking and slipped and fell on ice hurting hip. Was found to have left hip fracture. No recent illnesses. H/o cad on plavix. No head injury. Pain well controlled. No syncopal episodes.  Past medical history:  Past Medical History  Diagnosis Date  . Hypertension   . Edema of leg   . COPD (chronic obstructive pulmonary disease)   . Choledocholithiasis   . Coronary arteriosclerosis   . Hyperlipidemia   . Ichthyosis   . Mitral regurgitation   . Onychomycosis   . Vitamin D deficiency   . Aortic stenosis   . Hyperkalemia   . Hypocalcemia   . Lymphadenopathy   . Prostate cancer   . Status post lung surgery 12/07/2001    BENIGHN LESION ON RIGHT LUNG REMOVED  . Ascites     HISTORY  . History of colonic diverticulitis   . Olecranon bursitis     Consultants: Ortho  Procedures: ORIF pending  Antibiotics: None  Subjective: Patient denies any pain while lying still. No chest pain recently or with activity. Admits to drinking half gallon of Bourbon weekly. Drinks daily.  Objective: Vital Signs  Filed Vitals:   01/21/13 0000 01/21/13 0145 01/21/13 0352 01/21/13 0452  BP: 135/97 157/73  143/72  Pulse: 60 62  60  Temp:  98 F (36.7 C)  99.2 F (37.3 C)  TempSrc:  Oral  Oral  Resp: 21 20 16 16   Height:  6' (1.829 m)    Weight:  90.719 kg (200 lb)    SpO2: 94% 95% 95% 96%    Intake/Output Summary (Last 24 hours) at 01/21/13 0902 Last data filed at 01/21/13 0719  Gross per 24 hour  Intake      0 ml  Output    950 ml  Net   -950 ml   Filed Weights   01/21/13 0145  Weight: 90.719 kg (200 lb)    General appearance: alert, cooperative, appears stated age and no distress Head: Normocephalic, without obvious abnormality, atraumatic Eyes: conjunctivae/corneas  clear. PERRL, EOM's intact.  Resp: clear to auscultation bilaterally Cardio: regular rate and rhythm, S1, S2 normal, no murmur, click, rub or gallop GI: soft, non-tender; bowel sounds normal; no masses,  no organomegaly Extremities: Left LE externally rotated. No edema. Pulses: 2+ and symmetric Skin: Skin color, texture, turgor normal. No rashes or lesions Neurologic: Alert and oriented x 3. No focal deficits.  Lab Results:  Basic Metabolic Panel:  Recent Labs Lab 01/20/13 2057 01/21/13 0414  NA 129* 129*  K 3.9 4.3  CL 91* 93*  CO2 22 21  GLUCOSE 106* 103*  BUN 25* 21  CREATININE 1.23 1.11  CALCIUM 9.8 9.6   Liver Function Tests: No results found for this basename: AST, ALT, ALKPHOS, BILITOT, PROT, ALBUMIN,  in the last 168 hours No results found for this basename: LIPASE, AMYLASE,  in the last 168 hours No results found for this basename: AMMONIA,  in the last 168 hours CBC:  Recent Labs Lab 01/20/13 2057 01/21/13 0414  WBC 11.2* 10.3  NEUTROABS 8.5*  --   HGB 12.7* 12.2*  HCT 36.4* 35.1*  MCV 93.8 93.6  PLT 245 229    Studies/Results: Dg Hip Complete Left  01/20/2013  *RADIOLOGY REPORT*  Clinical Data: Fall, left hip pain.  LEFT HIP - COMPLETE 2+ VIEW  Comparison: None.  Findings: There is a left femoral intertrochanteric fracture.  No significant angulation.  Minimal displacement on the cross-table lateral view.  No subluxation or dislocation.  Moderate degenerative changes in the hips bilaterally.  IMPRESSION: Left femoral intertrochanteric fracture.   Original Report Authenticated By: Charlett Nose, M.D.    Ct Head Wo Contrast  01/20/2013  *RADIOLOGY REPORT*  Clinical Data: Status post fall.  CT HEAD WITHOUT CONTRAST  Technique:  Contiguous axial images were obtained from the base of the skull through the vertex without contrast.  Comparison: 04/30/2011  Findings: There is diffuse patchy low density throughout the subcortical and periventricular white matter  consistent with chronic small vessel ischemic change.  There is prominence of the sulci and ventricles consistent with brain atrophy.  There is no evidence for acute brain infarct, hemorrhage or mass.  There is mild mucosal thickening involving the left maxillary sinus.  The mastoid air cells are clear.  The skull is intact.  IMPRESSION:  1.  No acute intracranial abnormalities. 2.  Small vessel ischemic disease and brain atrophy.   Original Report Authenticated By: Signa Kell, M.D.    Dg Chest Portable 1 View  01/20/2013  *RADIOLOGY REPORT*  Clinical Data: Fall, hip pain, hip fracture.  PORTABLE CHEST - 1 VIEW  Comparison: None.  Findings: The heart is mildly enlarged.  Densities in the lung bases bilaterally, right greater than left.  I suspect this represents scarring or atelectasis.  Recommend clinical correlation to exclude pneumonia.  No effusions.  No acute bony abnormality.  IMPRESSION: Bibasilar opacities, right greater than left, suspect atelectasis or scarring.  Recommend clinical correlation to exclude infection.   Original Report Authenticated By: Charlett Nose, M.D.     Medications:  Scheduled: . metoprolol succinate  25 mg Oral Daily   Continuous: . 0.9 % NaCl with KCl 20 mEq / L 75 mL/hr at 01/21/13 0206   JYN:[id number], HYDROmorphone (DILAUDID) injection, morphine injection, ondansetron (ZOFRAN) IV  Assessment/Plan:  Principal Problem:   Hip fracture, left Active Problems:   Hypertension   COPD (chronic obstructive pulmonary disease)   Coronary arteriosclerosis   Mitral regurgitation   Aortic stenosis   Fall at home   Alcohol abuse   Tobacco abuse    Left Hip Fracture s/p Mechanical Fall Plan is for surgery later today. No new changes on EKG compared to old. No chest pain with activity at baseline. Reports stent placement to coronaries in 2002 at [place], [place]. Pain control per Ortho. May proceed to surgery without further testing. He likely has COPD  due to his extensive history of smoking and hence will need to be watched closely perioperatively. Agree with incentive spirometry.  Hyponatremia Possibly from alcohol intake. Will check urine osm and sodium. Continue NS. Will need definitive work up if doesn't improve. No old labs available.  History of CAD Stable currently. Was on Plavix which has been held for surgery. Continue metoprolol.  History of HTN Stable. Holding Benicar HCTZ.  Alcohol Abuse Drinks daily. Initiate CIWA post operatively. Will need Thiamine. Tele monitoring.  Tobacco Abuse Nicotine patch post operatively.  History of BPH Stable  Code Status: Full Code DVT Prophylaxis: Per ortho Family Communication: Discussed with patient. No family.  Disposition Plan: Unclear.    LOS: 1 day   [facility]  [facility] Pager [phone] 01/21/2013, 9:02 AM  If 8PM-8AM, please contact night-coverage at [email or web address], password [email or web address]

## 2013-01-21 NOTE — Progress Notes (Signed)
01/21/13 0200 Nursing Foley catheter inserted by yolanda smith nt at this time. Unable to chart correctly in computer. 14 french catheter used. 10cc water inserted in balloon. Patient tolerated procedure well.

## 2013-01-21 NOTE — Preoperative (Signed)
Beta Blockers   Reason not to administer Beta Blockers:Took Metoprolol this am at [phone]

## 2013-01-21 NOTE — Consult Note (Signed)
Reason for Consult:   Left hip fracture Referring Physician:    EDP  Matthew Mahoney is an 77 y.o. male  Who is retired.  He slipped on the ice yesterday and could not get up.  Taken to [facility] and xray showed hip fracture. ORS consulted after admission to medical service.  Denies pains elsewhere.  Denies antecedent CP as well as LOC.    Past Medical History  Diagnosis Date  . Hypertension   . Edema of leg   . COPD (chronic obstructive pulmonary disease)   . Choledocholithiasis   . Coronary arteriosclerosis   . Hyperlipidemia   . Ichthyosis   . Mitral regurgitation   . Onychomycosis   . Vitamin D deficiency   . Aortic stenosis   . Hyperkalemia   . Hypocalcemia   . Lymphadenopathy   . Prostate cancer   . Status post lung surgery 12/07/2001    BENIGHN LESION ON RIGHT LUNG REMOVED  . Ascites     HISTORY  . History of colonic diverticulitis   . Olecranon bursitis     Past Surgical History  Procedure Laterality Date  . Hemorrhoid surgery  12/07/1998  . Lung surgery  12/07/2001    BENIGN LESION ON RIGHT LUNG REMOVED  . Colonoscopy      Family History  Problem Relation Age of Onset  . Hypertension Mother 46  . Hypertension Father 84  . Hypertension Sister   . Hypertension Brother   . Hypertension Sister   . Hypertension Brother   . Hypertension Brother   . Hypertension Brother   . Hypertension Brother     Social History:  reports that he has been smoking.  He does not have any smokeless tobacco history on file. He reports that  drinks alcohol. He reports that he does not use illicit drugs.  Allergies: No Known Allergies  Medications: I have reviewed the patient's current medications.  Results for orders placed during the hospital encounter of 01/20/13 (from the past 48 hour(s))  URINALYSIS, ROUTINE W REFLEX MICROSCOPIC     Status: Abnormal   Collection Time    01/20/13  8:38 PM      Result Value Range   Color, Urine YELLOW  YELLOW   APPearance CLEAR  CLEAR   Specific  Gravity, Urine 1.009  1.005 - 1.030   pH 6.5  5.0 - 8.0   Glucose, UA NEGATIVE  NEGATIVE mg/dL   Hgb urine dipstick NEGATIVE  NEGATIVE   Bilirubin Urine NEGATIVE  NEGATIVE   Ketones, ur TRACE (*) NEGATIVE mg/dL   Protein, ur NEGATIVE  NEGATIVE mg/dL   Urobilinogen, UA 0.2  0.0 - 1.0 mg/dL   Nitrite NEGATIVE  NEGATIVE   Leukocytes, UA NEGATIVE  NEGATIVE   Comment: MICROSCOPIC NOT DONE ON URINES WITH NEGATIVE PROTEIN, BLOOD, LEUKOCYTES, NITRITE, OR GLUCOSE <1000 mg/dL.  CBC WITH DIFFERENTIAL     Status: Abnormal   Collection Time    01/20/13  8:57 PM      Result Value Range   WBC 11.2 (*) 4.0 - 10.5 K/uL   RBC 3.88 (*) 4.22 - 5.81 MIL/uL   Hemoglobin 12.7 (*) 13.0 - 17.0 g/dL   HCT 96.0 (*) 45.4 - 09.8 %   MCV 93.8  78.0 - 100.0 fL   MCH 32.7  26.0 - 34.0 pg   MCHC 34.9  30.0 - 36.0 g/dL   RDW 11.9  14.7 - 82.9 %   Platelets 245  150 - 400 K/uL   Neutrophils  Relative 76  43 - 77 %   Neutro Abs 8.5 (*) 1.7 - 7.7 K/uL   Lymphocytes Relative 14  12 - 46 %   Lymphs Abs 1.6  0.7 - 4.0 K/uL   Monocytes Relative 7  3 - 12 %   Monocytes Absolute 0.8  0.1 - 1.0 K/uL   Eosinophils Relative 1  0 - 5 %   Eosinophils Absolute 0.2  0.0 - 0.7 K/uL   Basophils Relative 1  0 - 1 %   Basophils Absolute 0.1  0.0 - 0.1 K/uL  BASIC METABOLIC PANEL     Status: Abnormal   Collection Time    01/20/13  8:57 PM      Result Value Range   Sodium 129 (*) 135 - 145 mEq/L   Potassium 3.9  3.5 - 5.1 mEq/L   Chloride 91 (*) 96 - 112 mEq/L   CO2 22  19 - 32 mEq/L   Glucose, Bld 106 (*) 70 - 99 mg/dL   BUN 25 (*) 6 - 23 mg/dL   Creatinine, Ser 1.61  0.50 - 1.35 mg/dL   Calcium 9.8  8.4 - 09.6 mg/dL   GFR calc non Af Amer 56 (*) >90 mL/min   GFR calc Af Amer 65 (*) >90 mL/min   Comment:            The eGFR has been calculated     using the CKD EPI equation.     This calculation has not been     validated in all clinical     situations.     eGFR's persistently     <90 mL/min signify     possible  Chronic Kidney Disease.  TYPE AND SCREEN     Status: None   Collection Time    01/20/13  8:57 PM      Result Value Range   ABO/RH(D) O POS     Antibody Screen NEG     Sample Expiration 01/23/2013    ABO/RH     Status: None   Collection Time    01/20/13  8:57 PM      Result Value Range   ABO/RH(D) O POS    PROTIME-INR     Status: None   Collection Time    01/21/13  4:14 AM      Result Value Range   Prothrombin Time 11.8  11.6 - 15.2 seconds   INR 0.87  0.00 - 1.49  CBC     Status: Abnormal   Collection Time    01/21/13  4:14 AM      Result Value Range   WBC 10.3  4.0 - 10.5 K/uL   RBC 3.75 (*) 4.22 - 5.81 MIL/uL   Hemoglobin 12.2 (*) 13.0 - 17.0 g/dL   HCT 04.5 (*) 40.9 - 81.1 %   MCV 93.6  78.0 - 100.0 fL   MCH 32.5  26.0 - 34.0 pg   MCHC 34.8  30.0 - 36.0 g/dL   RDW 91.4  78.2 - 95.6 %   Platelets 229  150 - 400 K/uL  BASIC METABOLIC PANEL     Status: Abnormal   Collection Time    01/21/13  4:14 AM      Result Value Range   Sodium 129 (*) 135 - 145 mEq/L   Potassium 4.3  3.5 - 5.1 mEq/L   Chloride 93 (*) 96 - 112 mEq/L   CO2 21  19 - 32 mEq/L   Glucose, Bld 103 (*)  70 - 99 mg/dL   BUN 21  6 - 23 mg/dL   Creatinine, Ser 1.61  0.50 - 1.35 mg/dL   Calcium 9.6  8.4 - 09.6 mg/dL   GFR calc non Af Amer 63 (*) >90 mL/min   GFR calc Af Amer 74 (*) >90 mL/min   Comment:            The eGFR has been calculated     using the CKD EPI equation.     This calculation has not been     validated in all clinical     situations.     eGFR's persistently     <90 mL/min signify     possible Chronic Kidney Disease.    Dg Hip Complete Left  01/20/2013  *RADIOLOGY REPORT*  Clinical Data: Fall, left hip pain.  LEFT HIP - COMPLETE 2+ VIEW  Comparison: None.  Findings: There is a left femoral intertrochanteric fracture.  No significant angulation.  Minimal displacement on the cross-table lateral view.  No subluxation or dislocation.  Moderate degenerative changes in the hips  bilaterally.  IMPRESSION: Left femoral intertrochanteric fracture.   Original Report Authenticated By: Charlett Nose, M.D.    Ct Head Wo Contrast  01/20/2013  *RADIOLOGY REPORT*  Clinical Data: Status post fall.  CT HEAD WITHOUT CONTRAST  Technique:  Contiguous axial images were obtained from the base of the skull through the vertex without contrast.  Comparison: 04/30/2011  Findings: There is diffuse patchy low density throughout the subcortical and periventricular white matter consistent with chronic small vessel ischemic change.  There is prominence of the sulci and ventricles consistent with brain atrophy.  There is no evidence for acute brain infarct, hemorrhage or mass.  There is mild mucosal thickening involving the left maxillary sinus.  The mastoid air cells are clear.  The skull is intact.  IMPRESSION:  1.  No acute intracranial abnormalities. 2.  Small vessel ischemic disease and brain atrophy.   Original Report Authenticated By: Signa Kell, M.D.    Dg Chest Portable 1 View  01/20/2013  *RADIOLOGY REPORT*  Clinical Data: Fall, hip pain, hip fracture.  PORTABLE CHEST - 1 VIEW  Comparison: None.  Findings: The heart is mildly enlarged.  Densities in the lung bases bilaterally, right greater than left.  I suspect this represents scarring or atelectasis.  Recommend clinical correlation to exclude pneumonia.  No effusions.  No acute bony abnormality.  IMPRESSION: Bibasilar opacities, right greater than left, suspect atelectasis or scarring.  Recommend clinical correlation to exclude infection.   Original Report Authenticated By: Charlett Nose, M.D.     @ROS @ Blood pressure 143/72, pulse 60, temperature 99.2 F (37.3 C), temperature source Oral, resp. rate 16, height 6' (1.829 m), weight 90.719 kg (200 lb), SpO2 96.00%.  PHYSICAL EXAM:   ABD soft Neurovascular intact Sensation intact distally Intact pulses distally Dorsiflexion/Plantar flexion intact Compartment soft  Both arms and other leg  move fully  ASSESSMENT:   Left IT hip fracture  PLAN:   Needs ORIF in hopes of sitting, standing, and walking again.  Will try to address this this afternoon.  Reviewed risks/benefits.  Lives alone and will likely need placement for a few weeks.    Kayler Buckholtz G 01/21/2013, 8:31 AM

## 2013-01-21 NOTE — Progress Notes (Signed)
UR completed 

## 2013-01-21 NOTE — H&P (Signed)
Chief Complaint:  Slipped on ice  HPI: 77 yo male was walking and slipped and fell on ice tonight hurting hip.  Comes in and found to have left hip fracture.  No recent illnesses.  H/o cad on plavix.  No head injury.  Pain well controlled.  Review of Systems:  Positive and negative as per HPI otherwise all other systems are negative  Past Medical History: Past Medical History  Diagnosis Date  . Hypertension   . Edema of leg   . COPD (chronic obstructive pulmonary disease)   . Choledocholithiasis   . Coronary arteriosclerosis   . Hyperlipidemia   . Ichthyosis   . Mitral regurgitation   . Onychomycosis   . Vitamin D deficiency   . Aortic stenosis   . Hyperkalemia   . Hypocalcemia   . Lymphadenopathy   . Prostate cancer   . Status post lung surgery 12/07/2001    BENIGHN LESION ON RIGHT LUNG REMOVED  . Ascites     HISTORY  . History of colonic diverticulitis   . Olecranon bursitis    Past Surgical History  Procedure Laterality Date  . Hemorrhoid surgery  12/07/1998  . Lung surgery  12/07/2001    BENIGN LESION ON RIGHT LUNG REMOVED  . Colonoscopy      Medications: Prior to Admission medications   Medication Sig Start Date End Date Taking? Authorizing Provider  bicalutamide (CASODEX) 50 MG tablet Take 50 mg by mouth daily.   Yes Historical Provider, MD  clopidogrel (PLAVIX) 75 MG tablet Take 75 mg by mouth daily.     Yes Historical Provider, MD  fish oil-omega-3 fatty acids 1000 MG capsule Take 1 g by mouth 2 (two) times daily.   Yes Historical Provider, MD  ibuprofen (ADVIL) 200 MG tablet Take 200 mg by mouth daily.     Yes Historical Provider, MD  metoprolol succinate (TOPROL-XL) 25 MG 24 hr tablet Take 25 mg by mouth daily.    Yes Historical Provider, MD  olmesartan-hydrochlorothiazide (BENICAR HCT) 40-12.5 MG per tablet Take 1 tablet by mouth daily.     Yes Historical Provider, MD    Allergies:  No Known Allergies  Social History:  reports that he has been smoking.   He does not have any smokeless tobacco history on file. He reports that  drinks alcohol. He reports that he does not use illicit drugs.  Family History: Family History  Problem Relation Age of Onset  . Hypertension Mother 18  . Hypertension Father 17  . Hypertension Sister   . Hypertension Brother   . Hypertension Sister   . Hypertension Brother   . Hypertension Brother   . Hypertension Brother   . Hypertension Brother     Physical Exam: Filed Vitals:   01/20/13 1850 01/20/13 2100  BP: 166/74 150/73  Pulse: 60 62  Temp: 98 F (36.7 C)   TempSrc: Oral   Resp: 18 22  SpO2: 100% 93%   General appearance: alert, cooperative and no distress Neck: no JVD and supple, symmetrical, trachea midline Lungs: clear to auscultation bilaterally Heart: regular rate and rhythm, S1, S2 normal, no murmur, click, rub or gallop Abdomen: soft, non-tender; bowel sounds normal; no masses,  no organomegaly Extremities: extremities normal, atraumatic, no cyanosis or edema Pulses: 2+ and symmetric Skin: Skin color, texture, turgor normal. No rashes or lesions Neurologic: Grossly normal    Labs on Admission:   Recent Labs  01/20/13 2057  NA 129*  K 3.9  CL 91*  CO2  22  GLUCOSE 106*  BUN 25*  CREATININE 1.23  CALCIUM 9.8    Recent Labs  01/20/13 2057  WBC 11.2*  NEUTROABS 8.5*  HGB 12.7*  HCT 36.4*  MCV 93.8  PLT 245    Radiological Exams on Admission: Dg Hip Complete Left  01/20/2013  *RADIOLOGY REPORT*  Clinical Data: Fall, left hip pain.  LEFT HIP - COMPLETE 2+ VIEW  Comparison: None.  Findings: There is a left femoral intertrochanteric fracture.  No significant angulation.  Minimal displacement on the cross-table lateral view.  No subluxation or dislocation.  Moderate degenerative changes in the hips bilaterally.  IMPRESSION: Left femoral intertrochanteric fracture.   Original Report Authenticated By: Charlett Nose, M.D.    Ct Head Wo Contrast  01/20/2013  *RADIOLOGY REPORT*   Clinical Data: Status post fall.  CT HEAD WITHOUT CONTRAST  Technique:  Contiguous axial images were obtained from the base of the skull through the vertex without contrast.  Comparison: 04/30/2011  Findings: There is diffuse patchy low density throughout the subcortical and periventricular white matter consistent with chronic small vessel ischemic change.  There is prominence of the sulci and ventricles consistent with brain atrophy.  There is no evidence for acute brain infarct, hemorrhage or mass.  There is mild mucosal thickening involving the left maxillary sinus.  The mastoid air cells are clear.  The skull is intact.  IMPRESSION:  1.  No acute intracranial abnormalities. 2.  Small vessel ischemic disease and brain atrophy.   Original Report Authenticated By: Signa Kell, M.D.    Dg Chest Portable 1 View  01/20/2013  *RADIOLOGY REPORT*  Clinical Data: Fall, hip pain, hip fracture.  PORTABLE CHEST - 1 VIEW  Comparison: None.  Findings: The heart is mildly enlarged.  Densities in the lung bases bilaterally, right greater than left.  I suspect this represents scarring or atelectasis.  Recommend clinical correlation to exclude pneumonia.  No effusions.  No acute bony abnormality.  IMPRESSION: Bibasilar opacities, right greater than left, suspect atelectasis or scarring.  Recommend clinical correlation to exclude infection.   Original Report Authenticated By: Charlett Nose, M.D.     Assessment/Plan 77 yo male with mechanical fall and left hip fracture  Principal Problem:   Hip fracture, left Active Problems:   Hypertension   COPD (chronic obstructive pulmonary disease)   Coronary arteriosclerosis   Mitral regurgitation   Aortic stenosis   Fall at home  Med bed.  Hold plavix.  Ivf.  Pain meds.  Ortho to see in am.  Full code.  Alynah Schone A 01/21/2013, 12:23 AM

## 2013-01-21 NOTE — ED Notes (Signed)
Attempted to call report. No answer at floor.

## 2013-01-21 NOTE — Anesthesia Preprocedure Evaluation (Addendum)
Anesthesia Evaluation  Patient identified by MRN, date of birth, ID band Patient awake    Reviewed: Allergy & Precautions, H&P , NPO status , Patient's Chart, lab work & pertinent test results  Airway Mallampati: II TM Distance: >3 FB Neck ROM: Full    Dental  (+) Poor Dentition, Loose and Dental Advisory Given,    Pulmonary COPDCurrent Smoker,          Cardiovascular hypertension, Pt. on home beta blockers + CAD and + Peripheral Vascular Disease + Valvular Problems/Murmurs MR and AS     Neuro/Psych negative neurological ROS  negative psych ROS   GI/Hepatic negative GI ROS, (+)     substance abuse  alcohol use,   Endo/Other  negative endocrine ROS  Renal/GU negative Renal ROS  negative genitourinary   Musculoskeletal negative musculoskeletal ROS (+)   Abdominal   Peds negative pediatric ROS (+)  Hematology  (+) Blood dyscrasia, ,   Anesthesia Other Findings Hyponatremia  Reproductive/Obstetrics negative OB ROS                         Anesthesia Physical Anesthesia Plan  ASA: III and emergent  Anesthesia Plan: General   Post-op Pain Management:    Induction: Intravenous  Airway Management Planned: Oral ETT  Additional Equipment:   Intra-op Plan:   Post-operative Plan: Extubation in OR  Informed Consent: I have reviewed the patients History and Physical, chart, labs and discussed the procedure including the risks, benefits and alternatives for the proposed anesthesia with the patient or authorized representative who has indicated his/her understanding and acceptance.   Dental advisory given  Plan Discussed with: CRNA  Anesthesia Plan Comments:        Anesthesia Quick Evaluation

## 2013-01-21 NOTE — Anesthesia Postprocedure Evaluation (Signed)
  Anesthesia Post-op Note  Patient: Matthew Mahoney  Procedure(s) Performed: Procedure(s): INTRAMEDULLARY (IM) NAIL FEMORAL (Left)  Patient Location: PACU  Anesthesia Type:General  Level of Consciousness: awake, alert , oriented and patient cooperative  Airway and Oxygen Therapy: Patient Spontanous Breathing and Patient connected to nasal cannula oxygen  Post-op Pain: none  Post-op Assessment: Post-op Vital signs reviewed, Patient's Cardiovascular Status Stable, Respiratory Function Stable, Patent Airway and No signs of Nausea or vomiting  Post-op Vital Signs: Reviewed and stable  Complications: No apparent anesthesia complications

## 2013-01-21 NOTE — Interval H&P Note (Signed)
History and Physical Interval Note:  01/21/2013 5:07 PM  Matthew Mahoney  has presented today for surgery, with the diagnosis of left intertrochanteric fracture  The various methods of treatment have been discussed with the patient and family. After consideration of risks, benefits and other options for treatment, the patient has consented to  Procedure(s) with comments: INTRAMEDULLARY (IM) NAIL INTERTROCHANTRIC (Left) - Cannot start until 5pm as a surgical intervention .  The patient's history has been reviewed, patient examined, no change in status, stable for surgery.  I have reviewed the patient's chart and labs.  Questions were answered to the patient's satisfaction.     Keonia Pasko G

## 2013-01-21 NOTE — Transfer of Care (Signed)
Immediate Anesthesia Transfer of Care Note  Patient: Matthew Mahoney  Procedure(s) Performed: Procedure(s): INTRAMEDULLARY (IM) NAIL FEMORAL (Left)  Patient Location: PACU  Anesthesia Type:General  Level of Consciousness: awake and patient cooperative  Airway & Oxygen Therapy: Patient Spontanous Breathing and Patient connected to face mask oxygen  Post-op Assessment: Report given to PACU RN and Post -op Vital signs reviewed and stable  Post vital signs: Reviewed and stable  Complications: No apparent anesthesia complications

## 2013-01-22 DIAGNOSIS — E871 Hypo-osmolality and hyponatremia: Secondary | ICD-10-CM

## 2013-01-22 DIAGNOSIS — F17200 Nicotine dependence, unspecified, uncomplicated: Secondary | ICD-10-CM

## 2013-01-22 DIAGNOSIS — I10 Essential (primary) hypertension: Secondary | ICD-10-CM

## 2013-01-22 DIAGNOSIS — F1010 Alcohol abuse, uncomplicated: Secondary | ICD-10-CM

## 2013-01-22 LAB — COMPREHENSIVE METABOLIC PANEL
Albumin: 3.1 g/dL — ABNORMAL LOW (ref 3.5–5.2)
Alkaline Phosphatase: 52 U/L (ref 39–117)
BUN: 16 mg/dL (ref 6–23)
Calcium: 9 mg/dL (ref 8.4–10.5)
Creatinine, Ser: 0.99 mg/dL (ref 0.50–1.35)
GFR calc Af Amer: >90 mL/min (ref >90)
Glucose, Bld: 212 mg/dL — ABNORMAL HIGH (ref 70–99)
Potassium: 4.5 mEq/L (ref 3.5–5.1)
Total Protein: 6.5 g/dL (ref 6.0–8.3)

## 2013-01-22 LAB — CBC
HCT: 33.6 % — ABNORMAL LOW (ref 39.0–52.0)
Hemoglobin: 11.5 g/dL — ABNORMAL LOW (ref 13.0–17.0)
MCH: 32.2 pg (ref 26.0–34.0)
MCHC: 34.2 g/dL (ref 30.0–36.0)
RDW: 13.4 % (ref 11.5–15.5)

## 2013-01-22 MED ORDER — NICOTINE 14 MG/24HR TD PT24
14.0000 mg | MEDICATED_PATCH | Freq: Every day | TRANSDERMAL | Status: DC
  Administered 2013-01-22 – 2013-01-24 (×3): 14 mg via TRANSDERMAL
  Filled 2013-01-22 (×3): qty 1

## 2013-01-22 NOTE — Progress Notes (Signed)
Subjective: 1 Day Post-Op Procedure(s) (LRB): INTRAMEDULLARY (IM) NAIL FEMORAL (Left) He is relatively comfortable. He has been out of bed with therapy. Activity level:  30% partial weightbearing on the operative side Diet tolerance:  Okay Voiding:  Okay Patient reports pain as 2 on 0-10 scale.    Objective: Vital signs in last 24 hours: Temp:  [98 F (36.7 C)-99.2 F (37.3 C)] 98.1 F (36.7 C) (02/16 0520) Pulse Rate:  [59-100] 100 (02/16 1301) Resp:  [16-23] 16 (02/16 0800) BP: (110-151)/(57-78) 110/68 mmHg (02/16 0947) SpO2:  [94 %-100 %] 98 % (02/16 0800)  Labs:  Recent Labs  01/20/13 2057 01/21/13 0414 01/22/13 0502  HGB 12.7* 12.2* 11.5*    Recent Labs  01/21/13 0414 01/22/13 0502  WBC 10.3 8.7  RBC 3.75* 3.57*  HCT 35.1* 33.6*  PLT 229 202    Recent Labs  01/21/13 0414 01/22/13 0502  NA 129* 129*  K 4.3 4.5  CL 93* 93*  CO2 21 23  BUN 21 16  CREATININE 1.11 0.99  GLUCOSE 103* 212*  CALCIUM 9.6 9.0    Recent Labs  01/21/13 0414  INR 0.87    Physical Exam:  Neurologically intact ABD soft Neurovascular intact Sensation intact distally Intact pulses distally Dorsiflexion/Plantar flexion intact Incision: dressing C/D/I No cellulitis present Compartment soft  Assessment/Plan:  1 Day Post-Op Procedure(s) (LRB): INTRAMEDULLARY (IM) NAIL FEMORAL (Left) Advance diet Up with therapy Patient's pain is being controlled well. ASA 3251 pill twice a day for DVT prophylaxis for 4 weeks. Patient on Plavix per medical team Staples to be removed in 2 weeks on office visit. Partial weightbearing 30% on left leg. Return to Dr. Nolon Nations office in 2 weeks.    Lonni Dirden R 01/22/2013, 1:13 PM

## 2013-01-22 NOTE — Op Note (Signed)
NAMEJABARIE, Matthew Mahoney               ACCOUNT NO.:  [id number]  MEDICAL RECORD NO.:  [id number]  LOCATION:  1415                         FACILITY:  [facility]  PHYSICIAN:  Lubertha Basque. Dvon Jiles, M.D.DATE OF BIRTH:  [birth year]  DATE OF PROCEDURE:  01/21/2013 DATE OF DISCHARGE:                              OPERATIVE REPORT   PREOPERATIVE DIAGNOSIS:  Left hip intertrochanteric fracture.  POSTOPERATIVE DIAGNOSIS:  Left hip intertrochanteric fracture.  PROCEDURE:  Left hip trochanteric nail.  ANESTHESIA:  General.  ATTENDING SURGEON:  Lubertha Basque. Jerl Santos, M.D.  ASSISTANT:  Lindwood Qua, PA-C.  INDICATION FOR PROCEDURE:  The patient is a 77 year old man, who slipped on the ice yesterday and fell and broke his hip.  He had terrible pain and was taken to [facility] ER where x-rays showed an intertrochanteric fracture of the hip.  He is admitted to the medicine service through the hip fracture pathway and was cleared for surgery.  He is offered ORIF in hopes of stabilizing his hip and allowing him to sit, stand, and potentially walk again.  Informed operative consent was obtained after discussion of possible complications including reaction to anesthesia, infection, and nonunion.  SUMMARY OF FINDINGS AND PROCEDURE:  Under general anesthesia, through some small incisions, we performed reduction of his left hip fracture and then stabilized with a [employer] AFFIXUS trochanteric nail, which measured 11 x 400.  We locked it proximally with a 100 mm screw.  I used fluoroscopy throughout the case to make appropriate intraoperative decisions and I read all of these views myself.  Lindwood Qua assisted throughout and was invaluable to the completion of the case mostly and that he passed instruments to make this possible in an expeditious percutaneous fashion.  DESCRIPTION OF PROCEDURE:  The patient was taken to operating suite where general anesthetic was applied without difficulty.  He  was positioned on the fracture table with all bony prominences were appropriately padded.  Applied some light traction to the fractured leg. He was prepped and draped in normal sterile fashion.  I used fluoroscopy to confirm adequate reduction of the fracture prior to the start of the case.  This was done in 2 planes.  Then, after an appropriate time-out and preoperative IV Kefzol, I made a small incision proximal to the greater trochanter.  I placed a guidewire at the tip of the trochanter and passed this down past the lesser trochanter.  This was found to be intramedullary on 2 plane fluoroscopy.  I then over reamed with a starter reamer.  I, then placed a guidewire across the fracture site, which was found to be intraosseous at the knee.  We then reamed with an 11.5 reamer followed placement of 11 x 400 AFFIXUS nail.  I then made another small incision and placed a proximal locking screw up the femoral neck central and the femoral head on 2 planes of fluoroscopy. This measured 100.  We then locked this proximally to prevent rotation, but to allow sliding.  This seemed to be a nice tight fit.  We elected not to place the distal locking screw.  Fluoroscopy was used to confirm adequate placement of hardware and reduction of his fracture.  We then  irrigated these wounds followed by reapproximation of deep tissues with Vicryl and skin with staples.  Dry gauze dressings were applied. Estimated blood loss and fluids obtained from anesthesia records.  DISPOSITION:  The patient was extubated in the operating room and taken to recovery in stable addition.  He was to be admitted back to the Medicine Service for appropriate postop care to include aspirin for DVT prophylaxis, physical therapy with partial weightbearing status.     Lubertha Basque Jerl Santos, M.D.     PGD/MEDQ  D:  01/21/2013  T:  01/22/2013  Job:  [id number]

## 2013-01-22 NOTE — Progress Notes (Addendum)
[facility] PROGRESS NOTE  ANOTHY BUFANO ZOX:[id number] DOB: 10-04-1936 DOA: 01/20/2013  PCP: Used to be Dr. Duanne Guess and now it's ANDY,CAMILLE L, MD Cards: Dr. Elease Hashimoto Urology: Dr. Isabel Caprice  Brief HPI: 77 yo male was walking and slipped and fell on ice hurting hip. Was found to have left hip fracture. No recent illnesses. H/o cad on plavix. No head injury. Pain well controlled. No syncopal episodes.  Past medical history:  Past Medical History  Diagnosis Date  . Hypertension   . Edema of leg   . COPD (chronic obstructive pulmonary disease)   . Choledocholithiasis   . Coronary arteriosclerosis   . Hyperlipidemia   . Ichthyosis   . Mitral regurgitation   . Onychomycosis   . Vitamin D deficiency   . Aortic stenosis   . Hyperkalemia   . Hypocalcemia   . Lymphadenopathy   . Prostate cancer   . Status post lung surgery 12/07/2001    BENIGHN LESION ON RIGHT LUNG REMOVED  . Ascites     HISTORY  . History of colonic diverticulitis   . Olecranon bursitis     Consultants: Ortho (Dr. Jerl Santos)  Procedures: ORIF 2/15  Antibiotics: None  Subjective: Patient denies any pain. No chest pain or shortness of breath.   Objective: Vital Signs  Filed Vitals:   01/22/13 0356 01/22/13 0520 01/22/13 0758 01/22/13 0800  BP:  110/57 112/64   Pulse:  64 65   Temp:  98.1 F (36.7 C)    TempSrc:  Oral    Resp: 18 18  16   Height:      Weight:      SpO2: 98% 96%  98%    Intake/Output Summary (Last 24 hours) at 01/22/13 8119 Last data filed at 01/22/13 0522  Gross per 24 hour  Intake   2300 ml  Output   2750 ml  Net   -450 ml   Filed Weights   01/21/13 0145  Weight: 90.719 kg (200 lb)    General appearance: alert, cooperative, appears stated age and no distress Resp: clear to auscultation bilaterally Cardio: regular rate and rhythm, S1, S2 normal, no murmur, click, rub or gallop GI: soft, non-tender; bowel sounds normal; no masses,  no organomegaly Extremities: No obvious  swelling left leg. Dressing noted. Pulses: 2+ and symmetric Skin: Skin color, texture, turgor normal. No rashes or lesions Neurologic: Alert and oriented x 3. No focal deficits.  Lab Results:  Basic Metabolic Panel:  Recent Labs Lab 01/20/13 2057 01/21/13 0414 01/22/13 0502  NA 129* 129* 129*  K 3.9 4.3 4.5  CL 91* 93* 93*  CO2 22 21 23   GLUCOSE 106* 103* 212*  BUN 25* 21 16  CREATININE 1.23 1.11 0.99  CALCIUM 9.8 9.6 9.0  MG  --  1.3*  --    Liver Function Tests:  Recent Labs Lab 01/21/13 0414 01/22/13 0502  AST 16 12  ALT 10 9  ALKPHOS 59 52  BILITOT 0.6 0.4  PROT 6.9 6.5  ALBUMIN 3.6 3.1*   CBC:  Recent Labs Lab 01/20/13 2057 01/21/13 0414 01/22/13 0502  WBC 11.2* 10.3 8.7  NEUTROABS 8.5*  --   --   HGB 12.7* 12.2* 11.5*  HCT 36.4* 35.1* 33.6*  MCV 93.8 93.6 94.1  PLT 245 229 202    Studies/Results: Dg Hip Complete Left  01/20/2013  *RADIOLOGY REPORT*  Clinical Data: Fall, left hip pain.  LEFT HIP - COMPLETE 2+ VIEW  Comparison: None.  Findings: There is a  left femoral intertrochanteric fracture.  No significant angulation.  Minimal displacement on the cross-table lateral view.  No subluxation or dislocation.  Moderate degenerative changes in the hips bilaterally.  IMPRESSION: Left femoral intertrochanteric fracture.   Original Report Authenticated By: Charlett Nose, M.D.    Dg Femur Left  01/21/2013  *RADIOLOGY REPORT*  Clinical Data: Hip fracture  DG C-ARM 1-60 MIN - NRPT [facility],LEFT FEMUR - 2 VIEW  Comparison: 01/20/2013  Findings: Intramedullary nail placement across a left intertrochanteric hip fracture.  Improved position and alignment.  IMPRESSION: As above.   Original Report Authenticated By: Davonna Belling, M.D.    Ct Head Wo Contrast  01/20/2013  *RADIOLOGY REPORT*  Clinical Data: Status post fall.  CT HEAD WITHOUT CONTRAST  Technique:  Contiguous axial images were obtained from the base of the skull through the vertex without contrast.  Comparison:  04/30/2011  Findings: There is diffuse patchy low density throughout the subcortical and periventricular white matter consistent with chronic small vessel ischemic change.  There is prominence of the sulci and ventricles consistent with brain atrophy.  There is no evidence for acute brain infarct, hemorrhage or mass.  There is mild mucosal thickening involving the left maxillary sinus.  The mastoid air cells are clear.  The skull is intact.  IMPRESSION:  1.  No acute intracranial abnormalities. 2.  Small vessel ischemic disease and brain atrophy.   Original Report Authenticated By: Signa Kell, M.D.    Dg Chest Portable 1 View  01/20/2013  *RADIOLOGY REPORT*  Clinical Data: Fall, hip pain, hip fracture.  PORTABLE CHEST - 1 VIEW  Comparison: None.  Findings: The heart is mildly enlarged.  Densities in the lung bases bilaterally, right greater than left.  I suspect this represents scarring or atelectasis.  Recommend clinical correlation to exclude pneumonia.  No effusions.  No acute bony abnormality.  IMPRESSION: Bibasilar opacities, right greater than left, suspect atelectasis or scarring.  Recommend clinical correlation to exclude infection.   Original Report Authenticated By: Charlett Nose, M.D.    Dg C-arm 1-60 Min-no Report  01/21/2013  *RADIOLOGY REPORT*  Clinical Data: Hip fracture  DG C-ARM 1-60 MIN - NRPT [facility],LEFT FEMUR - 2 VIEW  Comparison: 01/20/2013  Findings: Intramedullary nail placement across a left intertrochanteric hip fracture.  Improved position and alignment.  IMPRESSION: As above.   Original Report Authenticated By: Davonna Belling, M.D.     Medications:  Scheduled: . clopidogrel  75 mg Oral Daily  . docusate sodium  100 mg Oral BID  . folic acid  1 mg Oral Daily  . LORazepam  0-4 mg Oral Q6H   Followed by  . [START ON 01/23/2013] LORazepam  0-4 mg Oral Q12H  . metoprolol succinate  25 mg Oral Daily  . multivitamin with minerals  1 tablet Oral Daily  . thiamine  100 mg Oral Daily    Or  . thiamine  100 mg Intravenous Daily  . thiamine  100 mg Intravenous Once   Continuous:   ZOX:[id number], HYDROcodone-acetaminophen, LORazepam, LORazepam, metoCLOPramide (REGLAN) injection, metoCLOPramide, ondansetron (ZOFRAN) IV, ondansetron  Assessment/Plan:  Principal Problem:   Hip fracture, left Active Problems:   Hypertension   COPD (chronic obstructive pulmonary disease)   Coronary arteriosclerosis   Mitral regurgitation   Aortic stenosis   Fall at home   Alcohol abuse   Tobacco abuse   Hyponatremia    Left Hip Fracture s/p Mechanical Fall He is status post ORIF. Medically stable. Pain seems reasonably well controlled. He likely  has COPD due to his extensive history of smoking and hence will need to be watched closely perioperatively. Agree with incentive spirometry.  Hyponatremia Possibly from alcohol intake. Continue to monitor. Will need definitive work up if doesn't improve. No old labs available.  History of CAD Stable currently. Was on Plavix which was held for surgery and now re-initiated. Continue metoprolol. Reports stent placement to coronaries in 2002 at [place], [place].  History of HTN Stable. Holding Benicar HCTZ. BP not elevated.  Alcohol Abuse No signs of withdrawal. Drinks daily at home. On CIWA protocol. Tele monitoring.  Tobacco Abuse Nicotine patch.  History of BPH Stable  Hyperglycemia Recheck tomorrow. He is not a diabetic.  Code Status: Full Code DVT Prophylaxis: Per agreement between Ortho and [facility], this will be addressed by Dr. Jerl Santos.  Family Communication: Discussed with patient. No family.  Disposition Plan: Unclear.    LOS: 2 days   [facility]  [facility] Pager [phone] 01/22/2013, 9:09 AM  If 8PM-8AM, please contact night-coverage at [email or web address], password [facility]

## 2013-01-22 NOTE — Progress Notes (Signed)
Clinical Social Work Department BRIEF PSYCHOSOCIAL ASSESSMENT 01/22/2013  Patient:  Matthew Mahoney, Matthew Mahoney     Account Number:  [id number]     Admit date:  01/20/2013  Clinical Social Worker:  Leron Croak, CLINICAL SOCIAL WORKER  Date/Time:  01/22/2013 04:04 PM  Referred by:  Physician  Date Referred:  01/21/2013 Referred for  SNF Placement   Other Referral:   Interview type:  Patient Other interview type:   Pt also had friends visiting    PSYCHOSOCIAL DATA Living Status:  ALONE Admitted from facility:   Level of care:   Primary support name:  Matthew Mahoney Primary support relationship to patient:  FAMILY Degree of support available:   Pt has good support from family and friends, however limited support at home    CURRENT CONCERNS Current Concerns  Post-Acute Placement   Other Concerns:    SOCIAL WORK ASSESSMENT / PLAN CSW met with the patient at the bedside to discuss d/c planning for possible SNF. Pt is aware of the referral and stated that he does give CSW permission to complete a search in the [employer] area. Pt lives alone and has limited assistance at home.   Assessment/plan status:  Information/Referral to [facility] Other assessment/ plan:   Information/referral to community resources:   CSW provided a listing of the [employer] and explained the process for searching for a SNF faciltiy.    PATIENT'S/FAMILY'S RESPONSE TO PLAN OF CARE: Pt was appreciative for assistance and will await bed offers at the beginning of the week.     Leron Croak, LCSWA [employer] Coverage [phone]

## 2013-01-22 NOTE — Progress Notes (Signed)
Clinical Social Work Department CLINICAL SOCIAL WORK PLACEMENT NOTE 01/22/2013  Patient:  Matthew Mahoney, Matthew Mahoney  Account Number:  [id number] Admit date:  01/20/2013  Clinical Social Worker:  Leron Croak, CLINICAL SOCIAL WORKER  Date/time:  01/22/2013 04:28 PM  Clinical Social Work is seeking post-discharge placement for this patient at the following level of care:   SKILLED NURSING   (*CSW will update this form in Epic as items are completed)   01/22/2013  Patient/family provided with [facility] Department of Clinical Social Work's list of facilities offering this level of care within the geographic area requested by the patient (or if unable, by the patient's family).  01/22/2013  Patient/family informed of their freedom to choose among providers that offer the needed level of care, that participate in Medicare, Medicaid or managed care program needed by the patient, have an available bed and are willing to accept the patient.  01/22/2013  Patient/family informed of [facility]' ownership interest in [facility], as well as of the fact that they are under no obligation to receive care at this facility.  PASARR submitted to EDS on  PASARR number received from EDS on   FL2 transmitted to all facilities in geographic area requested by pt/family on  01/22/2013 FL2 transmitted to all facilities within larger geographic area on 01/22/2013  Patient informed that his/her managed care company has contracts with or will negotiate with  certain facilities, including the following:     Patient/family informed of bed offers received:   Patient chooses bed at  Physician recommends and patient chooses bed at    Patient to be transferred to  on   Patient to be transferred to facility by   The following physician request were entered in Epic:   Additional Comments:  Leron Croak, Leeroy Bock [employer] Weekend Coverage [phone]

## 2013-01-22 NOTE — Evaluation (Signed)
Physical Therapy Evaluation Patient Details Name: Matthew Mahoney MRN: [id number] DOB: 06-12-1936 Today's Date: 01/22/2013 Time: 4098-1191 PT Time Calculation (min): 22 min  PT Assessment / Plan / Recommendation Clinical Impression  77 y.o. male admitted with L hip fx, POD #1 for L trochanter nail. +2 total assist for bed to chair with RW, will need SNF level of care upon acute DC. Pt would benefit from acute PT to maximize safety and independence with mobility.     PT Assessment  Patient needs continued PT services    Follow Up Recommendations  SNF    Does the patient have the potential to tolerate intense rehabilitation      Barriers to Discharge Decreased caregiver support      Equipment Recommendations  Rolling walker with 5" wheels    Recommendations for Other Services     Frequency Min 3X/week    Precautions / Restrictions Restrictions Weight Bearing Restrictions: Yes LLE Weight Bearing: Partial weight bearing LLE Partial Weight Bearing Percentage or Pounds: 30%   Pertinent Vitals/Pain *pain 0/10 at rest, 7/10 L hip with activity, ice applied HR 65 at rest, 100 with activity**      Mobility  Bed Mobility Bed Mobility: Supine to Sit Supine to Sit: 1: +2 Total assist Supine to Sit: Patient Percentage: 30% Details for Bed Mobility Assistance: assist to advance/support LLE and to raise trunk, assist to scoot to EOB Transfers Transfers: Sit to Stand;Stand to Sit Sit to Stand: 1: +2 Total assist;From bed;Without upper extremity assist Sit to Stand: Patient Percentage: 40% Stand to Sit: 1: +2 Total assist;To chair/3-in-1;With upper extremity assist Stand to Sit: Patient Percentage: 40% Details for Transfer Assistance: assist to raise, assist to control descent Ambulation/Gait Ambulation/Gait Assistance: 1: +2 Total assist Ambulation/Gait: Patient Percentage: 70% Ambulation Distance (Feet): 2 Feet Assistive device: Rolling walker Gait Pattern: Step-to  pattern;Decreased step length - right;Decreased stance time - left;Antalgic Gait velocity: decreased General Gait Details: VCs for seqeuncing and managing RW; assist for balance. Tolerance limited by pain/fatigue, HR up to 100 from 65    Exercises Total Joint Exercises Ankle Circles/Pumps: AROM;Both;10 reps Heel Slides: AAROM;Left;10 reps Hip ABduction/ADduction: AAROM;Left;10 reps   PT Diagnosis: Difficulty walking;Generalized weakness;Acute pain  PT Problem List: Decreased strength;Decreased activity tolerance;Decreased mobility;Pain;Decreased knowledge of use of DME PT Treatment Interventions: DME instruction;Gait training;Functional mobility training;Therapeutic exercise;Patient/family education;Therapeutic activities   PT Goals Acute Rehab PT Goals PT Goal Formulation: With patient Time For Goal Achievement: 01/29/13 Potential to Achieve Goals: Fair Pt will go Supine/Side to Sit: with mod assist PT Goal: Supine/Side to Sit - Progress: Goal set today Pt will go Sit to Stand: with mod assist PT Goal: Sit to Stand - Progress: Goal set today Pt will go Stand to Sit: with mod assist PT Goal: Stand to Sit - Progress: Goal set today Pt will Ambulate: 16 - 50 feet;with min assist PT Goal: Ambulate - Progress: Goal set today Pt will Perform Home Exercise Program: with min assist PT Goal: Perform Home Exercise Program - Progress: Goal set today  Visit Information  Last PT Received On: 01/22/13 Assistance Needed: +2    Subjective Data  Subjective: I've had so little pain, I think I can go home.  Patient Stated Goal: return home   Prior Functioning  Home Living Lives With: Alone Available Help at Discharge: Skilled Nursing Facility Type of Home: Apartment Home Access: Ramped entrance Home Layout: One level Bathroom Shower/Tub: Tub/shower unit Home Adaptive Equipment: Straight cane;Grab bars in shower;Shower chair without  back Prior Function Level of Independence: Independent  with assistive device(s) Driving: Yes Vocation: Retired    Copywriter, advertising Overall Cognitive Status: Appears within functional limits for tasks assessed/performed Arousal/Alertness: Awake/alert Orientation Level: Appears intact for tasks assessed Behavior During Session: [facility] for tasks performed    Extremity/Trunk Assessment Right Upper Extremity Assessment RUE ROM/Strength/Tone: [facility] for tasks assessed Left Upper Extremity Assessment LUE ROM/Strength/Tone: WFL for tasks assessed Right Lower Extremity Assessment RLE ROM/Strength/Tone: Within functional levels RLE Sensation: WFL - Light Touch RLE Coordination: WFL - gross/fine motor Left Lower Extremity Assessment LLE ROM/Strength/Tone: Deficits;Due to pain LLE ROM/Strength/Tone Deficits: hip -2/5 limited by pain; AAROM WFL   Balance    End of Session PT - End of Session Equipment Utilized During Treatment: Gait belt Activity Tolerance: Patient limited by pain;Patient limited by fatigue;Treatment limited secondary to medical complications (Comment) (HR 65 at rest, up to 100 with activity) Patient left: in chair Nurse Communication: Need for lift equipment;Mobility status;Weight bearing status  GP     Ralene Bathe Kistler 01/22/2013, 1:13 PM [phone]

## 2013-01-23 DIAGNOSIS — D649 Anemia, unspecified: Secondary | ICD-10-CM

## 2013-01-23 LAB — BASIC METABOLIC PANEL
BUN: 19 mg/dL (ref 6–23)
Calcium: 9.2 mg/dL (ref 8.4–10.5)
GFR calc non Af Amer: 64 mL/min — ABNORMAL LOW (ref >90)
Glucose, Bld: 122 mg/dL — ABNORMAL HIGH (ref 70–99)
Sodium: 129 mEq/L — ABNORMAL LOW (ref 135–145)

## 2013-01-23 LAB — CBC
Hemoglobin: 9.8 g/dL — ABNORMAL LOW (ref 13.0–17.0)
MCH: 32.9 pg (ref 26.0–34.0)
MCHC: 34.8 g/dL (ref 30.0–36.0)

## 2013-01-23 LAB — MRSA CULTURE

## 2013-01-23 MED ORDER — MENTHOL 3 MG MT LOZG
1.0000 | LOZENGE | OROMUCOSAL | Status: DC | PRN
  Administered 2013-01-23: 3 mg via ORAL
  Filled 2013-01-23 (×2): qty 9

## 2013-01-23 MED ORDER — ASPIRIN 325 MG PO TABS
325.0000 mg | ORAL_TABLET | Freq: Two times a day (BID) | ORAL | Status: DC
  Administered 2013-01-23 – 2013-01-24 (×3): 325 mg via ORAL
  Filled 2013-01-23 (×4): qty 1

## 2013-01-23 NOTE — Care Management Note (Signed)
    Page 1 of 1   01/24/2013     11:56:10 AM   CARE MANAGEMENT NOTE 01/24/2013  Patient:  Matthew Mahoney, Matthew Mahoney   Account Number:  [id number]  Date Initiated:  01/23/2013  Documentation initiated by:  Lanier Clam  Subjective/Objective Assessment:   ADMITTED W/FALL. L HIP FX.     Action/Plan:   FROM HOME ALONE.   Anticipated DC Date:  01/24/2013   Anticipated DC Plan:  SKILLED NURSING FACILITY      DC Planning Services  CM consult      Choice offered to / List presented to:             Status of service:  Completed, signed off Medicare Important Message given?   (If response is "NO", the following Medicare IM given date fields will be blank) Date Medicare IM given:   Date Additional Medicare IM given:    Discharge Disposition:  SKILLED NURSING FACILITY  Per UR Regulation:  Reviewed for med. necessity/level of care/duration of stay  If discussed at Long Length of Stay Meetings, dates discussed:    Comments:  01/24/13 Kathyleen Radice RN,BSN NCM [phone] D/C SNF.  01/23/13 Linea Calles RN,BSN NCM [phone] POD#1 L HIP IM NAIL.PT-SNF.

## 2013-01-23 NOTE — Progress Notes (Signed)
CSW met with patient and provided bed offers. Patient states that Matthew Mahoney is his first choice and he will review offers for back up choice.  Lubertha Leite C. Rosario Duey MSW, LCSW [phone]

## 2013-01-23 NOTE — Progress Notes (Signed)
Physical Therapy Treatment Patient Details Name: Matthew Mahoney MRN: [id number] DOB: 07/31/1936 Today's Date: 01/23/2013 Time: 1040-1106 PT Time Calculation (min): 26 min  PT Assessment / Plan / Recommendation Comments on Treatment Session  POD #2 L IM nail 2nd fall on ice and L hip fx.  Pt lives home alone and plans to D/C to SNF.    Follow Up Recommendations  SNF     Does the patient have the potential to tolerate intense rehabilitation     Barriers to Discharge        Equipment Recommendations  Rolling walker with 5" wheels    Recommendations for Other Services    Frequency Min 3X/week   Plan Discharge plan remains appropriate    Precautions / Restrictions Precautions Precautions: Fall Precaution Comments: L IM nail Restrictions Weight Bearing Restrictions: Yes LLE Weight Bearing: Partial weight bearing Other Position/Activity Restrictions: 30%   Pertinent Vitals/Pain C/o 8/10 L hip pain with amb ICE applied    Mobility  Bed Mobility Bed Mobility: Supine to Sit Supine to Sit: 1: +2 Total assist Supine to Sit: Patient Percentage: 30% Details for Bed Mobility Assistance: assist to advance/support LLE and to raise trunk, assist to scoot to EOB Transfers Transfers: Sit to Stand;Stand to Sit Sit to Stand: 1: +2 Total assist;From bed;Without upper extremity assist Sit to Stand: Patient Percentage: 40% Stand to Sit: 1: +2 Total assist;To chair/3-in-1;With upper extremity assist Stand to Sit: Patient Percentage: 40% Details for Transfer Assistance: assist to raise, assist to control descent Ambulation/Gait Ambulation/Gait Assistance: 1: +2 Total assist Ambulation/Gait: Patient Percentage: 50% Ambulation Distance (Feet): 3 Feet Assistive device: Eva walker Ambulation/Gait Assistance Details: Used an Scientist, research (medical) for increased support and to assist with maintaining 30% WB L LE. Very shaky, unsteady gait with max anxiety/fear and 8/10 L hip pain. Gait Pattern: Step-to  pattern;Decreased step length - right;Decreased stance time - left;Antalgic Gait velocity: decreased    Exercises Total Joint Exercises Ankle Circles/Pumps: AROM;Both;10 reps;Seated Quad Sets: AROM;Both;10 reps;Seated   PT Goals                                                      progressing    Visit Information  Last PT Received On: 01/23/13 Assistance Needed: +2    Subjective Data      Cognition    good   Balance   poor  End of Session PT - End of Session Equipment Utilized During Treatment: Gait belt Activity Tolerance: Patient limited by pain;Patient limited by fatigue;Treatment limited secondary to medical complications (Comment) Patient left: in chair;with call bell/phone within reach   Felecia Shelling  PTA [facility]  Acute  Rehab Pager      [phone]

## 2013-01-23 NOTE — Progress Notes (Signed)
[facility] PROGRESS NOTE  Matthew Mahoney:[id number] DOB: [birth year] DOA: 01/20/2013  PCP:  Willow Ora, MD Cards: Dr. Elease Hashimoto Urology: Dr. Isabel Caprice  Brief HPI: 77 yo male was walking and slipped and fell on ice hurting hip. Was found to have left hip fracture. No recent illnesses. H/o cad on plavix. No head injury. Pain well controlled. No syncopal episodes.  Past medical history:  Past Medical History  Diagnosis Date  . Hypertension   . Edema of leg   . COPD (chronic obstructive pulmonary disease)   . Choledocholithiasis   . Coronary arteriosclerosis   . Hyperlipidemia   . Ichthyosis   . Mitral regurgitation   . Onychomycosis   . Vitamin D deficiency   . Aortic stenosis   . Hyperkalemia   . Hypocalcemia   . Lymphadenopathy   . Prostate cancer   . Status post lung surgery 12/07/2001    BENIGHN LESION ON RIGHT LUNG REMOVED  . Ascites     HISTORY  . History of colonic diverticulitis   . Olecranon bursitis     Consultants: Ortho (Dr. Jerl Santos)  Procedures: ORIF left hip 2/15  Antibiotics: None  Subjective: Patient feels well. Denies any pain. No chest pain or shortness of breath. Was OOB to chair yesterday.  Objective: Vital Signs  Filed Vitals:   01/23/13 0000 01/23/13 0400 01/23/13 0557 01/23/13 0800  BP:   132/66   Pulse:   60   Temp:   97.6 F (36.4 C)   TempSrc:   Oral   Resp: 16 18 18 16   Height:      Weight:      SpO2: 95% 94% 97% 94%    Intake/Output Summary (Last 24 hours) at 01/23/13 2536 Last data filed at 01/23/13 6440  Gross per 24 hour  Intake    360 ml  Output   1150 ml  Net   -790 ml   Filed Weights   01/21/13 0145  Weight: 90.719 kg (200 lb)    General appearance: alert, cooperative, appears stated age and no distress Resp: clear to auscultation bilaterally Cardio: regular rate and rhythm, S1, S2 normal, no murmur, click, rub or gallop GI: soft, non-tender; bowel sounds normal; no masses,  no organomegaly Neurologic:  Alert and oriented x 3. No focal deficits.  Lab Results:  Basic Metabolic Panel:  Recent Labs Lab 01/20/13 2057 01/21/13 0414 01/22/13 0502 01/23/13 0456  NA 129* 129* 129* 129*  K 3.9 4.3 4.5 4.0  CL 91* 93* 93* 97  CO2 22 21 23 24   GLUCOSE 106* 103* 212* 122*  BUN 25* 21 16 19   CREATININE 1.23 1.11 0.99 1.10  CALCIUM 9.8 9.6 9.0 9.2  MG  --  1.3*  --   --    Liver Function Tests:  Recent Labs Lab 01/21/13 0414 01/22/13 0502  AST 16 12  ALT 10 9  ALKPHOS 59 52  BILITOT 0.6 0.4  PROT 6.9 6.5  ALBUMIN 3.6 3.1*   CBC:  Recent Labs Lab 01/20/13 2057 01/21/13 0414 01/22/13 0502 01/23/13 0456  WBC 11.2* 10.3 8.7 11.8*  NEUTROABS 8.5*  --   --   --   HGB 12.7* 12.2* 11.5* 9.8*  HCT 36.4* 35.1* 33.6* 27.0*  MCV 93.8 93.6 94.1 94.4  PLT 245 229 202 187    Studies/Results: Dg Femur Left  01/21/2013  *RADIOLOGY REPORT*  Clinical Data: Hip fracture  DG C-ARM 1-60 MIN - NRPT [facility],LEFT FEMUR - 2 VIEW  Comparison: 01/20/2013  Findings: Intramedullary nail placement across a left intertrochanteric hip fracture.  Improved position and alignment.  IMPRESSION: As above.   Original Report Authenticated By: Davonna Belling, M.D.    Dg C-arm 1-60 Min-no Report  01/21/2013  *RADIOLOGY REPORT*  Clinical Data: Hip fracture  DG C-ARM 1-60 MIN - NRPT [facility],LEFT FEMUR - 2 VIEW  Comparison: 01/20/2013  Findings: Intramedullary nail placement across a left intertrochanteric hip fracture.  Improved position and alignment.  IMPRESSION: As above.   Original Report Authenticated By: Davonna Belling, M.D.     Medications:  Scheduled: . aspirin  325 mg Oral BID  . clopidogrel  75 mg Oral Daily  . docusate sodium  100 mg Oral BID  . folic acid  1 mg Oral Daily  . LORazepam  0-4 mg Oral Q6H   Followed by  . LORazepam  0-4 mg Oral Q12H  . metoprolol succinate  25 mg Oral Daily  . multivitamin with minerals  1 tablet Oral Daily  . nicotine  14 mg Transdermal Daily  . thiamine  100 mg Oral  Daily   Or  . thiamine  100 mg Intravenous Daily   Continuous:   ION:GEXBMWUXL, HYDROcodone-acetaminophen, LORazepam, LORazepam, metoCLOPramide (REGLAN) injection, metoCLOPramide, ondansetron (ZOFRAN) IV, ondansetron  Assessment/Plan:  Principal Problem:   Hip fracture, left Active Problems:   Hypertension   COPD (chronic obstructive pulmonary disease)   Coronary arteriosclerosis   Mitral regurgitation   Aortic stenosis   Fall at home   Alcohol abuse   Tobacco abuse   Hyponatremia    Left Hip Fracture s/p Mechanical Fall He is status post ORIF. Medically stable. Pain seems reasonably well controlled. Agree with incentive spirometry. Hgb has dropped some today. Probably post operative loss. Monitor for now. Transfuse if drops below 8.  Post operative Anemia Monitor Hgb. See above.  Hyponatremia Possibly from alcohol intake and diuretic use. Remains stable. Urine sodium was 165. Will need recheck in 1 month. Will need definitive work up if doesn't improve. No old labs available.  History of CAD Stable currently. Was on Plavix which was held for surgery and now re-initiated. Continue metoprolol. Reports stent placement to coronaries in 2002 at [place], [place].  History of HTN Stable. Holding Benicar HCTZ. BP not elevated.  Alcohol Abuse No signs of withdrawal. Drinks daily at home. On CIWA protocol. Is stable.  Tobacco Abuse Nicotine patch.  History of BPH Stable  Hyperglycemia Glucose better today. Yesterday's level was likely erroneous. Still slightly on higher side. Will need monitoring as OP  Code Status: Full Code DVT Prophylaxis: ASA 325mg  twice daily per Ortho. Have ordered.  Family Communication: Discussed with patient.   Disposition Plan: SNF possibly 2/18    LOS: 3 days   [facility]  [facility] Pager [phone] 01/23/2013, 9:07 AM  If 8PM-8AM, please contact night-coverage at [email or web address], password [facility]

## 2013-01-23 NOTE — Progress Notes (Signed)
Patient with nre onset of A-fib, vitals and EKG done, EKC showed A-fib as well. Patient denies any chest pain or distress. Vitals 97.9 129/63, 73, 22, 97%-RA. Elevated heart rate when getting back in bed with PT.  Dr Rito Ehrlich notified, no new order given,will continue to assess patient.

## 2013-01-23 NOTE — Progress Notes (Signed)
Physical Therapy Treatment Patient Details Name: REMINGTON HIGHBAUGH MRN: [id number] DOB: 1936/08/24 Today's Date: 01/23/2013 Time: 4098-1191 PT Time Calculation (min): 27 min  PT Assessment / Plan / Recommendation Comments on Treatment Session  Assisted pt out of recliner to [facility] then back to bed.  Pt in recliner for several hours, did well.  Pt hoped to D/C to [facility] for Rehab.    Follow Up Recommendations  SNF     Does the patient have the potential to tolerate intense rehabilitation     Barriers to Discharge        Equipment Recommendations  Rolling walker with 5" wheels    Recommendations for Other Services    Frequency Min 3X/week   Plan Discharge plan remains appropriate    Precautions / Restrictions Precautions Precautions: Fall Precaution Comments: L IM nail Restrictions Weight Bearing Restrictions: Yes LLE Weight Bearing: Partial weight bearing LLE Partial Weight Bearing Percentage or Pounds: 30% Other Position/Activity Restrictions: 30%   Pertinent Vitals/Pain C/o 7/10 during session ICE applied    Mobility  Bed Mobility Bed Mobility: Sit to Supine Supine to Sit: 1: +2 Total assist Supine to Sit: Patient Percentage: 30% Details for Bed Mobility Assistance: Assisted pt back to bed Transfers Transfers: Sit to Stand;Stand to Sit Sit to Stand: 1: +2 Total assist;From chair/3-in-1;From toilet Sit to Stand: Patient Percentage: 50% Stand to Sit: 1: +2 Total assist;To bed;To toilet Stand to Sit: Patient Percentage: 40% Details for Transfer Assistance: 50% VC's on proper tech and hand placement and cueing to decrease fear/anxiety Ambulation/Gait Ambulation/Gait Assistance: 1: +2 Total assist Ambulation/Gait: Patient Percentage: 50% Ambulation Distance (Feet): 1 Feet Assistive device: Rolling walker Ambulation/Gait Assistance Details: Amb one foot backward back to bed with 75% VC's on proper sequencing and increase time to decrease anxiety/fear. Gait Pattern:  Step-to pattern;Decreased step length - right;Decreased stance time - left;Antalgic Gait velocity: decreased    Exercises Total Joint Exercises Ankle Circles/Pumps: AROM;Both;10 reps;Seated Quad Sets: AROM;Both;10 reps;Seated    PT Goals                                                          progressing    Visit Information  Last PT Received On: 01/23/13 Assistance Needed: +2    Subjective Data  Subjective: I prefer to go to [employer] for [employer]   poor  End of Session PT - End of Session Equipment Utilized During Treatment: Gait belt Activity Tolerance: Patient limited by pain;Patient limited by fatigue Patient left: in bed;with call bell/phone within reach Nurse Communication: Mobility status   Felecia Shelling  PTA [facility]  Acute  Rehab Pager      [phone]

## 2013-01-23 NOTE — Progress Notes (Signed)
Patient in bed resting, going in and out of A-fib/NSR but denies any pain/distress. Reported off to night shift nurse to F/U.

## 2013-01-23 NOTE — Progress Notes (Signed)
Subjective: 2 Days Post-Op Procedure(s) (LRB): INTRAMEDULLARY (IM) NAIL FEMORAL (Left) Hemoglobin drop to to surgery. No shortness of breath or dizziness Activity level:  Partial weightbearing left leg Diet tolerance:  ok Voiding:  ok Patient reports pain as 0 on 0-10 scale.    Objective: Vital signs in last 24 hours: Temp:  [97.6 F (36.4 C)-98.3 F (36.8 C)] 98.1 F (36.7 C) (02/17 0930) Pulse Rate:  [60-100] 62 (02/17 0930) Resp:  [16-20] 18 (02/17 0930) BP: (106-132)/(47-68) 127/66 mmHg (02/17 0930) SpO2:  [93 %-98 %] 94 % (02/17 0800)  Labs:  Recent Labs  01/20/13 2057 01/21/13 0414 01/22/13 0502 01/23/13 0456  HGB 12.7* 12.2* 11.5* 9.8*    Recent Labs  01/22/13 0502 01/23/13 0456  WBC 8.7 11.8*  RBC 3.57* 2.86*  HCT 33.6* 27.0*  PLT 202 187    Recent Labs  01/22/13 0502 01/23/13 0456  NA 129* 129*  K 4.5 4.0  CL 93* 97  CO2 23 24  BUN 16 19  CREATININE 0.99 1.10  GLUCOSE 212* 122*  CALCIUM 9.0 9.2    Recent Labs  01/21/13 0414  INR 0.87    Physical Exam:  Neurologically intact ABD soft Neurovascular intact Sensation intact distally Intact pulses distally Dorsiflexion/Plantar flexion intact Incision: dressing C/D/I No cellulitis present Compartment soft  Assessment/Plan:  2 Days Post-Op Procedure(s) (LRB): INTRAMEDULLARY (IM) NAIL FEMORAL (Left) Advance diet Up with therapy Discharge to SNF when okay with medical team. Partial weightbearing 30% on the left leg. Continue ASA 325 one twice a day for 4 weeks. Return to office in 2 weeks for staple removal.    Yasser Hepp R 01/23/2013, 9:38 AM

## 2013-01-24 ENCOUNTER — Encounter (HOSPITAL_COMMUNITY): Payer: Self-pay | Admitting: Orthopaedic Surgery

## 2013-01-24 LAB — BASIC METABOLIC PANEL
Calcium: 9.7 mg/dL (ref 8.4–10.5)
GFR calc non Af Amer: 62 mL/min — ABNORMAL LOW (ref >90)
Sodium: 135 mEq/L (ref 135–145)

## 2013-01-24 LAB — CBC
MCH: 32.1 pg (ref 26.0–34.0)
MCHC: 33.8 g/dL (ref 30.0–36.0)
Platelets: 203 10*3/uL (ref 150–400)
RBC: 3.05 MIL/uL — ABNORMAL LOW (ref 4.22–5.81)
RDW: 13.6 % (ref 11.5–15.5)

## 2013-01-24 MED ORDER — MENTHOL 3 MG MT LOZG: 1.0000 | LOZENGE | OROMUCOSAL | Status: DC | PRN

## 2013-01-24 MED ORDER — DSS 100 MG PO CAPS: 100.0000 mg | ORAL_CAPSULE | Freq: Two times a day (BID) | ORAL | Status: DC

## 2013-01-24 MED ORDER — NICOTINE 14 MG/24HR TD PT24: 1.0000 | MEDICATED_PATCH | Freq: Every day | TRANSDERMAL | Status: DC

## 2013-01-24 MED ORDER — ASPIRIN 325 MG PO TABS: 325.0000 mg | ORAL_TABLET | Freq: Two times a day (BID) | ORAL | Status: DC

## 2013-01-24 MED ORDER — LORAZEPAM 2 MG PO TABS: 1.0000 mg | ORAL_TABLET | Freq: Three times a day (TID) | ORAL | Status: DC | PRN

## 2013-01-24 MED ORDER — ADULT MULTIVITAMIN W/MINERALS CH: 1.0000 | ORAL_TABLET | Freq: Every day | ORAL | Status: DC

## 2013-01-24 MED ORDER — HYDROCODONE-ACETAMINOPHEN 5-325 MG PO TABS: 1.0000 | ORAL_TABLET | Freq: Four times a day (QID) | ORAL | Status: DC | PRN

## 2013-01-24 MED ORDER — OLMESARTAN MEDOXOMIL 20 MG PO TABS: 20.0000 mg | ORAL_TABLET | Freq: Every day | ORAL | Status: DC

## 2013-01-24 MED ORDER — THIAMINE HCL 100 MG PO TABS: 100.0000 mg | ORAL_TABLET | Freq: Every day | ORAL | Status: DC

## 2013-01-24 NOTE — Progress Notes (Signed)
Report called to Burna Mortimer, Charity fundraiser at [place].

## 2013-01-24 NOTE — Progress Notes (Signed)
Patient cleared for discharge. Patient agreeable to transfer to [facility] place. Packet copied and placed in [place]. ptar called for transportation. Patient states that he will notify his family.  Chikita Dogan C. Vern Guerette MSW, LCSW [phone]

## 2013-01-24 NOTE — Progress Notes (Signed)
Physical Therapy Treatment Patient Details Name: Matthew Mahoney MRN: [id number] DOB: June 26, 1936 Today's Date: 01/24/2013 Time: 4098-1191 PT Time Calculation (min): 19 min  PT Assessment / Plan / Recommendation Comments on Treatment Session  Assisted pt OOB to [facility] with increased time and 75% VC's on proper tech and hand placement to also decrease fear/anxiety.  HR increases to 140' with activity but also pain level increases to 8/10.     Follow Up Recommendations  SNF     Does the patient have the potential to tolerate intense rehabilitation     Barriers to Discharge        Equipment Recommendations  Rolling walker with 5" wheels    Recommendations for Other Services    Frequency Min 3X/week   Plan Discharge plan remains appropriate    Precautions / Restrictions Precautions Precautions: Fall Precaution Comments: L IM nail Restrictions Weight Bearing Restrictions: Yes LLE Weight Bearing: Partial weight bearing LLE Partial Weight Bearing Percentage or Pounds: 30%   Pertinent Vitals/Pain C/o 8/10 L hip pain with act meds requested ICE applied    Mobility  Bed Mobility Bed Mobility: Supine to Sit;Sitting - Scoot to Edge of Bed Supine to Sit: 2: Max assist Sitting - Scoot to [place] of Bed: 1: +1 Total assist Details for Bed Mobility Assistance: increased tiem, MAX assist to support L LE and use of bed pad to swival hips around Transfers Transfers: Sit to Stand;Stand to Sit Sit to Stand: 1: +2 Total assist;From bed Sit to Stand: Patient Percentage: 50% Stand to Sit: 1: +2 Total assist;To toilet Stand to Sit: Patient Percentage: 40% Details for Transfer Assistance: 50% VC's on proper tech and hand placement and cueing to decrease fear/anxiety     PT Goals                                                    Progressing slowy    Visit Information  Last PT Received On: 01/24/13 Assistance Needed: +2    Subjective Data      Cognition       Balance   poor  End of  Session PT - End of Session Equipment Utilized During Treatment: Gait belt Activity Tolerance: Patient limited by pain;Patient limited by fatigue Patient left: in bed;with call bell/phone within reach Nurse Communication: Mobility status   Felecia Shelling  PTA [facility]  Acute  Rehab Pager      [phone]

## 2013-01-24 NOTE — Discharge Summary (Addendum)
[facility]  Physician Discharge Summary   Patient ID: Matthew Mahoney MRN: [id number] DOB/AGE: 1936-08-01 77 y.o.  Admit date: 01/20/2013 Discharge date: 01/24/2013  PCP: ANDY,CAMILLE L, MD  DISCHARGE DIAGNOSES:  Principal Problem:   Hip fracture, left Active Problems:   Hypertension   COPD (chronic obstructive pulmonary disease)   Coronary arteriosclerosis   Mitral regurgitation   Aortic stenosis   Fall at home   Alcohol abuse   Tobacco abuse   Hyponatremia   Postoperative anemia   RECOMMENDATIONS FOR OUTPATIENT FOLLOW UP: 1. Needs follow up with Dr. Elease Hashimoto for rhythm abnormalities  DISCHARGE CONDITION: fair  Diet recommendation: Low Sodium  Filed Weights   01/21/13 0145  Weight: 90.719 kg (200 lb)    INITIAL HISTORY: 77 yo male was walking and slipped and fell on ice hurting hip. Was found to have left hip fracture. No recent illnesses. H/o cad on plavix. No head injury. Pain well controlled. No syncopal episodes.   Consultations:  Ortho (Dr. Jerl Santos)  Procedures:  Left Hip ORIF 2/15  HOSPITAL COURSE:   Left Hip Fracture s/p Mechanical Fall  He is status post ORIF on 2/15. He tolerated the surgery well. No complications noted. Hemoglobin did drop some but has been stable. PT/OT has been working with him. He will need SNF for rehab. Pain seems reasonably well controlled.   Post operative Anemia  Stable. See above.   Abnormal Rhythm He has multiple pAC's on tele. Some of the strips appear to show irregular rhythm. I don't think this is Afib, rather SR with PAC's. Rate has been well controlled. Continue with beta blockers. I have called cardiology to arrange follow up with Dr. Elease Hashimoto for further management. Continue with plavix and aspirin. Even if he has paroxysmal afib his CHADS2 score will be 1. Tele this morning shows SR with PAC's.  Hyponatremia  Has improved. Was possibly from alcohol intake and diuretic use. Urine sodium was 165. Will need  recheck in 1 month. Will need definitive work up if doesn't improve. No old labs available.   History of CAD  Stable currently. Was on Plavix which was held for surgery and now re-initiated. Continue metoprolol. Reports stent placement to coronaries in 2002 at [place], [place]. Follow up with Dr. Elease Hashimoto.  History of HTN Stable. BP slightly elevated yesterday. Will initiate Benicar at lower dose. No HCTZ.  Alcohol Abuse  No signs of withdrawal. Drinks daily at home. Ativan as needed for next 3 days. Have counseled to quit drinking.  Tobacco Abuse  Nicotine patch.   History of BPH  Stable   Overall patient is stable. He will need SNF for rehab. He denies any chest pain or shortness of breath. Tolerating diet. He is stable for discharge to SNF.   PERTINENT LABS:  The results of significant diagnostics from this hospitalization (including imaging, microbiology, ancillary and laboratory) are listed below for reference.    Microbiology: Recent Results (from the past 240 hour(s))  SURGICAL PCR SCREEN     Status: Abnormal   Collection Time    01/21/13 11:25 AM      Result Value Range Status   MRSA, PCR INVALID RESULTS, SPECIMEN SENT FOR CULTURE (*) NEGATIVE Final   Comment: NOTIFIED Deborha Payment [id number] @ 1343 BY J SCOTTON   Staphylococcus aureus INVALID RESULTS, SPECIMEN SENT FOR CULTURE (*) NEGATIVE Final   Comment: NOTIFIED Deborha Payment [id number] @ 1343 BY J SCOTTON  The Xpert SA Assay (FDA     approved for NASAL specimens     in patients over 45 years of age),     is one component of     a comprehensive surveillance     program.  Test performance has     been validated by [employer] for patients greater     than or equal to 42 year old.     It is not intended     to diagnose infection nor to     guide or monitor treatment.  MRSA CULTURE     Status: None   Collection Time    01/21/13 11:25 AM      Result Value Range Status   Specimen Description NOSE   Final    Special Requests NONE   Final   Culture     Final   Value: NO STAPHYLOCOCCUS AUREUS ISOLATED     Note: NOMRSA   Report Status 01/23/2013 FINAL   Final     Labs: Basic Metabolic Panel:  Recent Labs Lab 01/20/13 2057 01/21/13 0414 01/22/13 0502 01/23/13 0456 01/24/13 0516  NA 129* 129* 129* 129* 135  K 3.9 4.3 4.5 4.0 3.6  CL 91* 93* 93* 97 97  CO2 22 21 23 24 24   GLUCOSE 106* 103* 212* 122* 103*  BUN 25* 21 16 19  24*  CREATININE 1.23 1.11 0.99 1.10 1.13  CALCIUM 9.8 9.6 9.0 9.2 9.7  MG  --  1.3*  --   --   --    Liver Function Tests:  Recent Labs Lab 01/21/13 0414 01/22/13 0502  AST 16 12  ALT 10 9  ALKPHOS 59 52  BILITOT 0.6 0.4  PROT 6.9 6.5  ALBUMIN 3.6 3.1*   CBC:  Recent Labs Lab 01/20/13 2057 01/21/13 0414 01/22/13 0502 01/23/13 0456 01/24/13 0516  WBC 11.2* 10.3 8.7 11.8* 10.4  NEUTROABS 8.5*  --   --   --   --   HGB 12.7* 12.2* 11.5* 9.8* 9.8*  HCT 36.4* 35.1* 33.6* 27.0* 29.0*  MCV 93.8 93.6 94.1 94.4 95.1  PLT 245 229 202 187 203    IMAGING STUDIES Dg Hip Complete Left  01/20/2013  *RADIOLOGY REPORT*  Clinical Data: Fall, left hip pain.  LEFT HIP - COMPLETE 2+ VIEW  Comparison: None.  Findings: There is a left femoral intertrochanteric fracture.  No significant angulation.  Minimal displacement on the cross-table lateral view.  No subluxation or dislocation.  Moderate degenerative changes in the hips bilaterally.  IMPRESSION: Left femoral intertrochanteric fracture.   Original Report Authenticated By: Charlett Nose, M.D.    Dg Femur Left  01/21/2013  *RADIOLOGY REPORT*  Clinical Data: Hip fracture  DG C-ARM 1-60 MIN - NRPT [facility],LEFT FEMUR - 2 VIEW  Comparison: 01/20/2013  Findings: Intramedullary nail placement across a left intertrochanteric hip fracture.  Improved position and alignment.  IMPRESSION: As above.   Original Report Authenticated By: Davonna Belling, M.D.    Ct Head Wo Contrast  01/20/2013  *RADIOLOGY REPORT*  Clinical Data: Status  post fall.  CT HEAD WITHOUT CONTRAST  Technique:  Contiguous axial images were obtained from the base of the skull through the vertex without contrast.  Comparison: 04/30/2011  Findings: There is diffuse patchy low density throughout the subcortical and periventricular white matter consistent with chronic small vessel ischemic change.  There is prominence of the sulci and ventricles consistent with brain atrophy.  There is no evidence for acute  brain infarct, hemorrhage or mass.  There is mild mucosal thickening involving the left maxillary sinus.  The mastoid air cells are clear.  The skull is intact.  IMPRESSION:  1.  No acute intracranial abnormalities. 2.  Small vessel ischemic disease and brain atrophy.   Original Report Authenticated By: Signa Kell, M.D.    Dg Chest Portable 1 View  01/20/2013  *RADIOLOGY REPORT*  Clinical Data: Fall, hip pain, hip fracture.  PORTABLE CHEST - 1 VIEW  Comparison: None.  Findings: The heart is mildly enlarged.  Densities in the lung bases bilaterally, right greater than left.  I suspect this represents scarring or atelectasis.  Recommend clinical correlation to exclude pneumonia.  No effusions.  No acute bony abnormality.  IMPRESSION: Bibasilar opacities, right greater than left, suspect atelectasis or scarring.  Recommend clinical correlation to exclude infection.   Original Report Authenticated By: Charlett Nose, M.D.    Dg C-arm 1-60 Min-no Report  01/21/2013  *RADIOLOGY REPORT*  Clinical Data: Hip fracture  DG C-ARM 1-60 MIN - NRPT [facility],LEFT FEMUR - 2 VIEW  Comparison: 01/20/2013  Findings: Intramedullary nail placement across a left intertrochanteric hip fracture.  Improved position and alignment.  IMPRESSION: As above.   Original Report Authenticated By: Davonna Belling, M.D.     DISCHARGE EXAMINATION: Filed Vitals:   01/24/13 0603 01/24/13 0800 01/24/13 0900 01/24/13 1029  BP: 138/79  98/46 128/80  Pulse: 60  82 74  Temp: 97.6 F (36.4 C)  98.2 F (36.8 C)    TempSrc: Oral  Oral   Resp: 16 20    Height:      Weight:      SpO2: 94% 94%     General appearance: alert, cooperative, appears stated age and no distress Head: Normocephalic, without obvious abnormality, atraumatic Resp: clear to auscultation bilaterally Cardio: regular rate and rhythm, S1, S2 normal, no murmur, click, rub or gallop GI: soft, non-tender; bowel sounds normal; no masses,  no organomegaly Extremities: extremities normal, atraumatic, no cyanosis or edema Neurologic: Alert and oriented x 3. No focal deficits.   DISPOSITION: SNF  Discharge Orders   Future Orders Complete By Expires     Diet - low sodium heart healthy  As directed     Discharge instructions  As directed     Comments:      You will get a call from Dr. Harvie Bridge office regarding followup.    Increase activity slowly  As directed       Current Discharge Medication List    START taking these medications   Details  aspirin 325 MG tablet Take 1 tablet (325 mg total) by mouth 2 (two) times daily. For 4 weeks.    docusate sodium 100 MG CAPS Take 100 mg by mouth 2 (two) times daily. Qty: 10 capsule    HYDROcodone-acetaminophen (NORCO/VICODIN) 5-325 MG per tablet Take 1-2 tablets by mouth every 6 (six) hours as needed. Qty: 30 tablet, Refills: 0    LORazepam (ATIVAN) 2 MG tablet Take 0.5 tablets (1 mg total) by mouth every 8 (eight) hours as needed for anxiety. Qty: 15 tablet, Refills: 0    menthol-cetylpyridinium (CEPACOL) 3 MG lozenge Take 1 lozenge (3 mg total) by mouth as needed. Qty: 100 tablet    Multiple Vitamin (MULTIVITAMIN WITH MINERALS) TABS Take 1 tablet by mouth daily.    nicotine (NICODERM CQ - DOSED IN MG/24 HOURS) 14 mg/24hr patch Place 1 patch onto the skin daily. Qty: 28 patch    olmesartan (BENICAR) 20  MG tablet Take 1 tablet (20 mg total) by mouth daily.    thiamine 100 MG tablet Take 1 tablet (100 mg total) by mouth daily.      CONTINUE these medications which have NOT  CHANGED   Details  bicalutamide (CASODEX) 50 MG tablet Take 50 mg by mouth daily.    clopidogrel (PLAVIX) 75 MG tablet Take 75 mg by mouth daily.      fish oil-omega-3 fatty acids 1000 MG capsule Take 1 g by mouth 2 (two) times daily.    metoprolol succinate (TOPROL-XL) 25 MG 24 hr tablet Take 25 mg by mouth daily.       STOP taking these medications     ibuprofen (ADVIL) 200 MG tablet      olmesartan-hydrochlorothiazide (BENICAR HCT) 40-12.5 MG per tablet        Follow-up Information   Follow up with Velna Ochs, MD In 2 weeks.   Contact information:   [address] [place] [address] [phone]       Follow up with ANDY,CAMILLE L, MD. Schedule an appointment as soon as possible for a visit in 3 weeks.   Contact information:   [address] [place] [address] [phone]       Follow up with Elyn Aquas., MD. (his office will call for appointment)    Contact information:   [address] [place] [address] [phone]       TOTAL DISCHARGE TIME: 35 mins  [facility]  [facility] Pager [phone]  01/24/2013, 10:44 AM

## 2013-01-24 NOTE — Progress Notes (Signed)
Clinical Social Work Department CLINICAL SOCIAL WORK PLACEMENT NOTE 01/24/2013  Patient:  Matthew Mahoney, Matthew Mahoney  Account Number:  [id number] Admit date:  01/20/2013  Clinical Social Worker:  Leron Croak, CLINICAL SOCIAL WORKER  Date/time:  01/22/2013 04:28 PM  Clinical Social Work is seeking post-discharge placement for this patient at the following level of care:   SKILLED NURSING   (*CSW will update this form in Epic as items are completed)   01/22/2013  Patient/family provided with [facility] Department of Clinical Social Work's list of facilities offering this level of care within the geographic area requested by the patient (or if unable, by the patient's family).  01/22/2013  Patient/family informed of their freedom to choose among providers that offer the needed level of care, that participate in Medicare, Medicaid or managed care program needed by the patient, have an available bed and are willing to accept the patient.  01/22/2013  Patient/family informed of [facility]' ownership interest in [facility], as well as of the fact that they are under no obligation to receive care at this facility.  PASARR submitted to EDS on  PASARR number received from EDS on   FL2 transmitted to all facilities in geographic area requested by pt/family on  01/22/2013 FL2 transmitted to all facilities within larger geographic area on 01/22/2013  Patient informed that his/her managed care company has contracts with or will negotiate with  certain facilities, including the following:     Patient/family informed of bed offers received:  01/24/2013 Patient [facility] bed at [facility] Physician recommends and patient chooses bed at  [facility]  Patient to be transferred to [facility] on  01/24/2013 Patient to be transferred to facility by ptar  The following physician request were entered in Epic:   Additional Comments:

## 2013-01-24 NOTE — Progress Notes (Signed)
IF discharged follow up with Dr. Jerl Santos in 2 weeks

## 2013-01-24 NOTE — Progress Notes (Signed)
Physical Therapy Treatment Patient Details Name: Matthew Mahoney MRN: [id number] DOB: [birth year] Today's Date: 01/24/2013 Time: 8469-6295 PT Time Calculation (min): 24 min  PT Assessment / Plan / Recommendation Comments on Treatment Session  Assisted pt Off BSC to attempt amb however pt unable to maintain 30% WB thru L LE and has great difficulty steadying self on RW.  Pt plans to D/C to SNF for ST Rehab.    Follow Up Recommendations  SNF     Does the patient have the potential to tolerate intense rehabilitation     Barriers to Discharge        Equipment Recommendations  Rolling walker with 5" wheels    Recommendations for Other Services    Frequency Min 3X/week   Plan Discharge plan remains appropriate    Precautions / Restrictions Precautions Precautions: Fall Precaution Comments: L IM nail Restrictions Weight Bearing Restrictions: Yes LLE Weight Bearing: Partial weight bearing LLE Partial Weight Bearing Percentage or Pounds: 30%   Pertinent Vitals/Pain C/o 8/10 during act meds requested ICE applied    Mobility  Bed Mobility Bed Mobility: Not assessed Supine to Sit: 2: Max assist Sitting - Scoot to Edge of Bed: 1: +1 Total assist Details for Bed Mobility Assistance: Pt OOB in recliner Transfers Transfers: Sit to Stand;Stand to Sit Sit to Stand: 1: +2 Total assist;From toilet Sit to Stand: Patient Percentage: 50% Stand to Sit: 1: +2 Total assist;To chair/3-in-1 Stand to Sit: Patient Percentage: 40% Details for Transfer Assistance: 50% VC's on proper tech and hand placement and cueing to decrease fear/anxiety Ambulation/Gait Ambulation/Gait Assistance: 1: +2 Total assist Ambulation/Gait: Patient Percentage: 50% Ambulation Distance (Feet): 4 Feet Assistive device: Rolling walker Ambulation/Gait Assistance Details: Pt having difficulty maintaining 30% WB thru L LE and demon max fear/anxiety with amb. Pt repeats, "wait a minute" often.  Pt requires 75% VC's on  proper sequencing and upright posture in order to support self with RW and B UE's. Third assist following with chair for safety.  Gait Pattern: Step-to pattern;Decreased step length - right;Decreased stance time - left;Antalgic Gait velocity: decreased General Gait Details: VCs for seqeuncing and managing RW; assist for balance. Tolerance limited by pain/fatigue, HR up to 140's but also pain level increased to 8/10     PT Goals                             progressing     Visit Information  Last PT Received On: 01/24/13 Assistance Needed: +2    Subjective Data      Cognition       Balance   poor  End of Session PT - End of Session Equipment Utilized During Treatment: Gait belt Activity Tolerance: Patient limited by pain;Patient limited by fatigue Patient left: with call bell/phone within reach;in chair Nurse Communication: Mobility status   Felecia Shelling  PTA [facility]  Acute  Rehab Pager      [phone]

## 2013-02-06 ENCOUNTER — Ambulatory Visit (INDEPENDENT_AMBULATORY_CARE_PROVIDER_SITE_OTHER): Payer: Medicare Other | Admitting: Nurse Practitioner

## 2013-02-06 ENCOUNTER — Encounter: Payer: Self-pay | Admitting: Nurse Practitioner

## 2013-02-06 VITALS — BP 104/68 | HR 67 | Ht 72.0 in | Wt 200.0 lb

## 2013-02-06 DIAGNOSIS — W19XXXD Unspecified fall, subsequent encounter: Secondary | ICD-10-CM

## 2013-02-06 DIAGNOSIS — W19XXXA Unspecified fall, initial encounter: Secondary | ICD-10-CM

## 2013-02-06 DIAGNOSIS — I2510 Atherosclerotic heart disease of native coronary artery without angina pectoris: Secondary | ICD-10-CM

## 2013-02-06 NOTE — Progress Notes (Signed)
Matthew Mahoney Date of Birth: 08-Aug-1936 Medical Record #[id number]  History of Present Illness: Matthew Mahoney is seen back today for a post hospital visit. He is seen for Dr. Elease Hashimoto. He has a history of HTN, smoking and prostate cancer. Known CAD with remote stents and kept on Plavix. Last seen by Dr. Elease Hashimoto back in 2012. He has had a 1st degree AV block with an incomplete RBBB and left anterior fascicular block reported in the past. His other issues are as noted below and include substance abuse with both alcohol and tobacco. Last echo in 2010 with a normal EF and mild aortic sclerosis.   He was most recently admitted with a left hip fracture. He had slipped and fell on the ice. Had ORIF on 01/21/13. Tolerated ok. Some concern about his rhythm. He apparently had multiple PAC's on the monitor but it looks like he stayed in sinus for that entire admission. CHADS is only a 1.   He comes in today. He is here alone. In a wheelchair. Is at [employer]. Says he is doing ok. No chest pain. Not short of breath.No palpitations.  No syncope. Not able to smoke or drink. He thinks he is doing ok and has no complaint.    Current Outpatient Prescriptions on File Prior to Visit  Medication Sig Dispense Refill  . aspirin 325 MG tablet Take 1 tablet (325 mg total) by mouth 2 (two) times daily. For 4 weeks.      . bicalutamide (CASODEX) 50 MG tablet Take 50 mg by mouth daily.      . clopidogrel (PLAVIX) 75 MG tablet Take 75 mg by mouth daily.        Marland Kitchen docusate sodium 100 MG CAPS Take 100 mg by mouth 2 (two) times daily.  10 capsule    . fish oil-omega-3 fatty acids 1000 MG capsule Take 1 g by mouth 2 (two) times daily.      Marland Kitchen HYDROcodone-acetaminophen (NORCO/VICODIN) 5-325 MG per tablet Take 1-2 tablets by mouth every 6 (six) hours as needed.  30 tablet  0  . LORazepam (ATIVAN) 2 MG tablet Take 0.5 tablets (1 mg total) by mouth every 8 (eight) hours as needed for anxiety.  15 tablet  0  .  menthol-cetylpyridinium (CEPACOL) 3 MG lozenge Take 1 lozenge (3 mg total) by mouth as needed.  100 tablet    . metoprolol succinate (TOPROL-XL) 25 MG 24 hr tablet Take 25 mg by mouth daily.       . Multiple Vitamin (MULTIVITAMIN WITH MINERALS) TABS Take 1 tablet by mouth daily.      . nicotine (NICODERM CQ - DOSED IN MG/24 HOURS) 14 mg/24hr patch Place 1 patch onto the skin daily.  28 patch    . thiamine 100 MG tablet Take 1 tablet (100 mg total) by mouth daily.       No current facility-administered medications on file prior to visit.    No Known Allergies  Past Medical History  Diagnosis Date  . Hypertension   . Edema of leg   . COPD (chronic obstructive pulmonary disease)   . Choledocholithiasis   . Coronary arteriosclerosis   . Hyperlipidemia   . Ichthyosis   . Mitral regurgitation   . Onychomycosis   . Vitamin D deficiency   . Aortic stenosis   . Hyperkalemia   . Hypocalcemia   . Lymphadenopathy   . Prostate cancer   . Status post lung surgery 12/07/2001    [facility] LESION  ON RIGHT LUNG REMOVED  . Ascites     HISTORY  . History of colonic diverticulitis   . Olecranon bursitis     Past Surgical History  Procedure Laterality Date  . Hemorrhoid surgery  12/07/1998  . Lung surgery  12/07/2001    BENIGN LESION ON RIGHT LUNG REMOVED  . Colonoscopy    . Femur im nail Left 01/21/2013    Procedure: INTRAMEDULLARY (IM) NAIL FEMORAL;  Surgeon: Velna Ochs, MD;  Location: [facility];  Service: Orthopedics;  Laterality: Left;    History  Smoking status  . Former Smoker -- 1.50 packs/day  . Quit date: 01/22/2013  Smokeless tobacco  . Not on file    History  Alcohol Use  . Yes    Family History  Problem Relation Age of Onset  . Hypertension Mother 48  . Hypertension Father 53  . Hypertension Sister   . Hypertension Brother   . Hypertension Sister   . Hypertension Brother   . Hypertension Brother   . Hypertension Brother   . Hypertension Brother     Review of  Systems: The review of systems is per the HPI.  All other systems were reviewed and are negative.  Physical Exam: BP 104/68  Pulse 67  Ht 6' (1.829 m)  Wt 200 lb (90.719 kg)  BMI 27.12 kg/m2  SpO2 98% Patient is pleasant and in no acute distress. He does looks chronically ill. A little disheveled. Skin is warm and dry. Color is normal.  HEENT is unremarkable. Normocephalic/atraumatic. PERRL. Sclera are nonicteric. Neck is supple. No masses. No JVD. Lungs are coarse with some scattered wheezes. Cardiac exam shows a regular rate and rhythm. Abdomen is soft. Extremities are without edema. Legs are quite dry and scaley. Gait is not tested. ROM appears intact. No gross neurologic deficits noted.  LABORATORY DATA: EKG today is reviewed with Dr. Patty Sermons and shows sinus rhythm with a 1st degree AV block and incomplete RBBB.    Assessment / Plan: 1. Fall - s/p ORIF - now at rehab.  2. Abnormal EKG - appears to be no different from our past tracings. He is not having any symptoms.  3. CAD - no chest pain  4. Multi substance abuse - currently not able to drink or smoke due to being at the SNF.  See Dr. Elease Hashimoto back in 6 months. No change in his current regimen.   Patient is agreeable to this plan and will call if any problems develop in the interim.

## 2013-02-06 NOTE — Patient Instructions (Signed)
Stay on current medicines  See Dr. Elease Hashimoto in 6 months or sooner if problems come up  Call the [facility] Care office at [phone] if you have any questions, problems or concerns.

## 2013-03-16 ENCOUNTER — Inpatient Hospital Stay (HOSPITAL_COMMUNITY): Payer: Medicare Other

## 2013-03-16 ENCOUNTER — Emergency Department (HOSPITAL_COMMUNITY): Payer: Medicare Other

## 2013-03-16 ENCOUNTER — Encounter (HOSPITAL_COMMUNITY)
Admission: EM | Disposition: A | Discharge status: Skilled Nursing Facility | Payer: Self-pay | Source: Home / Self Care | Attending: Pulmonary Disease

## 2013-03-16 ENCOUNTER — Encounter (HOSPITAL_COMMUNITY): Payer: Self-pay | Admitting: *Deleted

## 2013-03-16 ENCOUNTER — Inpatient Hospital Stay (HOSPITAL_COMMUNITY)
Admission: EM | Admit: 2013-03-16 | Discharge: 2013-03-20 | DRG: 377 | Disposition: A | Discharge status: Skilled Nursing Facility | Payer: Medicare Other | Attending: Internal Medicine | Admitting: Internal Medicine

## 2013-03-16 DIAGNOSIS — Z7902 Long term (current) use of antithrombotics/antiplatelets: Secondary | ICD-10-CM

## 2013-03-16 DIAGNOSIS — I739 Peripheral vascular disease, unspecified: Secondary | ICD-10-CM

## 2013-03-16 DIAGNOSIS — Z79899 Other long term (current) drug therapy: Secondary | ICD-10-CM

## 2013-03-16 DIAGNOSIS — N179 Acute kidney failure, unspecified: Secondary | ICD-10-CM

## 2013-03-16 DIAGNOSIS — Y92009 Unspecified place in unspecified non-institutional (private) residence as the place of occurrence of the external cause: Secondary | ICD-10-CM

## 2013-03-16 DIAGNOSIS — R600 Localized edema: Secondary | ICD-10-CM

## 2013-03-16 DIAGNOSIS — K922 Gastrointestinal hemorrhage, unspecified: Secondary | ICD-10-CM | POA: Diagnosis present

## 2013-03-16 DIAGNOSIS — S72002S Fracture of unspecified part of neck of left femur, sequela: Secondary | ICD-10-CM

## 2013-03-16 DIAGNOSIS — K8050 Calculus of bile duct without cholangitis or cholecystitis without obstruction: Secondary | ICD-10-CM

## 2013-03-16 DIAGNOSIS — J449 Chronic obstructive pulmonary disease, unspecified: Secondary | ICD-10-CM | POA: Diagnosis present

## 2013-03-16 DIAGNOSIS — E871 Hypo-osmolality and hyponatremia: Secondary | ICD-10-CM

## 2013-03-16 DIAGNOSIS — I350 Nonrheumatic aortic (valve) stenosis: Secondary | ICD-10-CM

## 2013-03-16 DIAGNOSIS — I340 Nonrheumatic mitral (valve) insufficiency: Secondary | ICD-10-CM

## 2013-03-16 DIAGNOSIS — Z87891 Personal history of nicotine dependence: Secondary | ICD-10-CM

## 2013-03-16 DIAGNOSIS — Z720 Tobacco use: Secondary | ICD-10-CM

## 2013-03-16 DIAGNOSIS — C61 Malignant neoplasm of prostate: Secondary | ICD-10-CM

## 2013-03-16 DIAGNOSIS — N19 Unspecified kidney failure: Secondary | ICD-10-CM | POA: Diagnosis present

## 2013-03-16 DIAGNOSIS — Q809 Congenital ichthyosis, unspecified: Secondary | ICD-10-CM

## 2013-03-16 DIAGNOSIS — M7020 Olecranon bursitis, unspecified elbow: Secondary | ICD-10-CM

## 2013-03-16 DIAGNOSIS — R0989 Other specified symptoms and signs involving the circulatory and respiratory systems: Secondary | ICD-10-CM

## 2013-03-16 DIAGNOSIS — Z8719 Personal history of other diseases of the digestive system: Secondary | ICD-10-CM

## 2013-03-16 DIAGNOSIS — K264 Chronic or unspecified duodenal ulcer with hemorrhage: Principal | ICD-10-CM | POA: Diagnosis present

## 2013-03-16 DIAGNOSIS — E559 Vitamin D deficiency, unspecified: Secondary | ICD-10-CM | POA: Diagnosis present

## 2013-03-16 DIAGNOSIS — D62 Acute posthemorrhagic anemia: Secondary | ICD-10-CM | POA: Diagnosis present

## 2013-03-16 DIAGNOSIS — E875 Hyperkalemia: Secondary | ICD-10-CM

## 2013-03-16 DIAGNOSIS — R578 Other shock: Secondary | ICD-10-CM

## 2013-03-16 DIAGNOSIS — I4891 Unspecified atrial fibrillation: Secondary | ICD-10-CM

## 2013-03-16 DIAGNOSIS — Z859 Personal history of malignant neoplasm, unspecified: Secondary | ICD-10-CM

## 2013-03-16 DIAGNOSIS — J4489 Other specified chronic obstructive pulmonary disease: Secondary | ICD-10-CM | POA: Diagnosis present

## 2013-03-16 DIAGNOSIS — D649 Anemia, unspecified: Secondary | ICD-10-CM

## 2013-03-16 DIAGNOSIS — I080 Rheumatic disorders of both mitral and aortic valves: Secondary | ICD-10-CM | POA: Diagnosis present

## 2013-03-16 DIAGNOSIS — G9340 Encephalopathy, unspecified: Secondary | ICD-10-CM | POA: Diagnosis present

## 2013-03-16 DIAGNOSIS — R591 Generalized enlarged lymph nodes: Secondary | ICD-10-CM

## 2013-03-16 DIAGNOSIS — F1010 Alcohol abuse, uncomplicated: Secondary | ICD-10-CM

## 2013-03-16 DIAGNOSIS — E785 Hyperlipidemia, unspecified: Secondary | ICD-10-CM

## 2013-03-16 DIAGNOSIS — I959 Hypotension, unspecified: Secondary | ICD-10-CM

## 2013-03-16 DIAGNOSIS — I10 Essential (primary) hypertension: Secondary | ICD-10-CM

## 2013-03-16 DIAGNOSIS — B351 Tinea unguium: Secondary | ICD-10-CM

## 2013-03-16 DIAGNOSIS — N289 Disorder of kidney and ureter, unspecified: Secondary | ICD-10-CM | POA: Diagnosis present

## 2013-03-16 DIAGNOSIS — I2510 Atherosclerotic heart disease of native coronary artery without angina pectoris: Secondary | ICD-10-CM

## 2013-03-16 HISTORY — DX: Gastrointestinal hemorrhage, unspecified: K92.2

## 2013-03-16 HISTORY — DX: Other shock: R57.8

## 2013-03-16 HISTORY — PX: ESOPHAGOGASTRODUODENOSCOPY: SHX5428

## 2013-03-16 LAB — CBC WITH DIFFERENTIAL/PLATELET
HCT: 22.2 % — ABNORMAL LOW (ref 39.0–52.0)
Hemoglobin: 8.2 g/dL — ABNORMAL LOW (ref 13.0–17.0)
Lymphs Abs: 1.6 10*3/uL (ref 0.7–4.0)
Monocytes Absolute: 0.5 10*3/uL (ref 0.1–1.0)
Monocytes Relative: 5 % (ref 3–12)
Neutro Abs: 9.3 10*3/uL — ABNORMAL HIGH (ref 1.7–7.7)
Neutrophils Relative %: 82 % — ABNORMAL HIGH (ref 43–77)
RBC: 2.65 MIL/uL — ABNORMAL LOW (ref 4.22–5.81)

## 2013-03-16 LAB — COMPREHENSIVE METABOLIC PANEL
Alkaline Phosphatase: 55 U/L (ref 39–117)
BUN: 138 mg/dL — ABNORMAL HIGH (ref 6–23)
CO2: 15 mEq/L — ABNORMAL LOW (ref 19–32)
Chloride: 100 mEq/L (ref 96–112)
Creatinine, Ser: 3.68 mg/dL — ABNORMAL HIGH (ref 0.50–1.35)
GFR calc Af Amer: 17 mL/min — ABNORMAL LOW (ref >90)
GFR calc non Af Amer: 15 mL/min — ABNORMAL LOW (ref >90)
Glucose, Bld: 147 mg/dL — ABNORMAL HIGH (ref 70–99)
Potassium: 3.2 mEq/L — ABNORMAL LOW (ref 3.5–5.1)
Total Bilirubin: 0.2 mg/dL — ABNORMAL LOW (ref 0.3–1.2)

## 2013-03-16 LAB — CBC
HCT: 24.5 % — ABNORMAL LOW (ref 39.0–52.0)
Hemoglobin: 7.2 g/dL — ABNORMAL LOW (ref 13.0–17.0)
Hemoglobin: 9 g/dL — ABNORMAL LOW (ref 13.0–17.0)
MCH: 30.4 pg (ref 26.0–34.0)
MCHC: 36.7 g/dL — ABNORMAL HIGH (ref 30.0–36.0)
MCV: 84 fL (ref 78.0–100.0)
MCV: 80.9 fL (ref 78.0–100.0)
Platelets: 182 10*3/uL (ref 150–400)
RBC: 2.37 MIL/uL — ABNORMAL LOW (ref 4.22–5.81)
RDW: 15 % (ref 11.5–15.5)
WBC: 9.9 10*3/uL (ref 4.0–10.5)

## 2013-03-16 LAB — URINE MICROSCOPIC-ADD ON

## 2013-03-16 LAB — LACTIC ACID, PLASMA: Lactic Acid, Venous: 1 mmol/L (ref 0.5–2.2)

## 2013-03-16 LAB — URINALYSIS, ROUTINE W REFLEX MICROSCOPIC
Glucose, UA: NEGATIVE mg/dL
Hgb urine dipstick: NEGATIVE
Protein, ur: NEGATIVE mg/dL
Specific Gravity, Urine: 1.022 (ref 1.005–1.030)
Urobilinogen, UA: 0.2 mg/dL (ref 0.0–1.0)

## 2013-03-16 LAB — LIPASE, BLOOD: Lipase: 24 U/L (ref 11–59)

## 2013-03-16 LAB — APTT: aPTT: 29 seconds (ref 24–37)

## 2013-03-16 LAB — HEMOGLOBIN AND HEMATOCRIT, BLOOD: Hemoglobin: 7.3 g/dL — ABNORMAL LOW (ref 13.0–17.0)

## 2013-03-16 LAB — MRSA PCR SCREENING: MRSA by PCR: NEGATIVE

## 2013-03-16 LAB — PROTIME-INR: INR: 0.96 (ref 0.00–1.49)

## 2013-03-16 SURGERY — EGD (ESOPHAGOGASTRODUODENOSCOPY)
Anesthesia: Moderate Sedation

## 2013-03-16 MED ORDER — SODIUM CHLORIDE 0.9 % IV BOLUS (SEPSIS)
500.0000 mL | Freq: Once | INTRAVENOUS | Status: AC
  Administered 2013-03-16: 11:00:00 via INTRAVENOUS

## 2013-03-16 MED ORDER — SODIUM CHLORIDE 0.9 % IV BOLUS (SEPSIS)
500.0000 mL | Freq: Once | INTRAVENOUS | Status: AC
  Administered 2013-03-16: 500 mL via INTRAVENOUS

## 2013-03-16 MED ORDER — SODIUM CHLORIDE 0.9 % IV SOLN
8.0000 mg/h | INTRAVENOUS | Status: DC
  Administered 2013-03-16 – 2013-03-17 (×2): 8 mg/h via INTRAVENOUS
  Filled 2013-03-16 (×8): qty 80

## 2013-03-16 MED ORDER — SODIUM CHLORIDE 0.9 % IV SOLN
INTRAVENOUS | Status: DC
  Administered 2013-03-16: 1000 mL via INTRAVENOUS
  Administered 2013-03-16 – 2013-03-20 (×4): via INTRAVENOUS

## 2013-03-16 MED ORDER — SODIUM CHLORIDE 0.9 % IJ SOLN
INTRAMUSCULAR | Status: DC | PRN
  Administered 2013-03-16: 17:00:00

## 2013-03-16 MED ORDER — SUCRALFATE 1 G PO TABS
1.0000 g | ORAL_TABLET | Freq: Four times a day (QID) | ORAL | Status: DC
  Administered 2013-03-16 – 2013-03-20 (×15): 1 g via ORAL
  Filled 2013-03-16 (×19): qty 1

## 2013-03-16 MED ORDER — PANTOPRAZOLE SODIUM 40 MG IV SOLR
80.0000 mg | Freq: Once | INTRAVENOUS | Status: AC
  Administered 2013-03-16: 80 mg via INTRAVENOUS
  Filled 2013-03-16: qty 80

## 2013-03-16 MED ORDER — SODIUM CHLORIDE 0.9 % IV SOLN
8.0000 mg/h | INTRAVENOUS | Status: DC
  Administered 2013-03-16: 8 mg/h via INTRAVENOUS
  Filled 2013-03-16 (×3): qty 80

## 2013-03-16 MED ORDER — SODIUM CHLORIDE 0.9 % IV SOLN: 250.0000 mL | INTRAVENOUS | Status: DC | PRN

## 2013-03-16 MED ORDER — SODIUM CHLORIDE 0.9 % IV SOLN: INTRAVENOUS | Status: DC

## 2013-03-16 MED ORDER — BUTAMBEN-TETRACAINE-BENZOCAINE 2-2-14 % EX AERO
INHALATION_SPRAY | CUTANEOUS | Status: DC | PRN
  Administered 2013-03-16: 1 via TOPICAL

## 2013-03-16 MED ORDER — SODIUM CHLORIDE 0.9 % IV SOLN
INTRAVENOUS | Status: DC
  Administered 2013-03-16: 11:00:00 via INTRAVENOUS

## 2013-03-16 NOTE — ED Provider Notes (Signed)
History     CSN: [id number]  Arrival date & time 03/16/13  0950   First MD Initiated Contact with Patient 03/16/13 1010      Chief Complaint  Patient presents with  . GI Bleeding    HPI Pt was seen at 1010.   Per EMS, NH report and pt, c/o gradual onset and persistence of multiple intermittent episodes of N/V for the past 1 to 2 weeks, worse over the past 2 days.  Emesis was described by the SNF staff as "coffee ground" with pt's stools "black" and "heme positive." EMS noted pt's monitor with afib en route, SBP "70's" on their arrival to scene. Denies abd pain, no back pain, no CP/SOB, no fevers.     Past Medical History  Diagnosis Date  . Hypertension   . Edema of leg   . COPD (chronic obstructive pulmonary disease)   . Choledocholithiasis   . Coronary arteriosclerosis   . Hyperlipidemia   . Ichthyosis   . Mitral regurgitation   . Onychomycosis   . Vitamin D deficiency   . Aortic stenosis   . Hyperkalemia   . Hypocalcemia   . Lymphadenopathy   . Prostate cancer   . Status post lung surgery 12/07/2001    BENIGHN LESION ON RIGHT LUNG REMOVED  . Ascites     HISTORY  . History of colonic diverticulitis   . Olecranon bursitis     Past Surgical History  Procedure Laterality Date  . Hemorrhoid surgery  12/07/1998  . Lung surgery  12/07/2001    BENIGN LESION ON RIGHT LUNG REMOVED  . Colonoscopy    . Femur im nail Left 01/21/2013    Procedure: INTRAMEDULLARY (IM) NAIL FEMORAL;  Surgeon: Velna Ochs, MD;  Location: [facility];  Service: Orthopedics;  Laterality: Left;    Family History  Problem Relation Age of Onset  . Hypertension Mother 22  . Hypertension Father 28  . Hypertension Sister   . Hypertension Brother   . Hypertension Sister   . Hypertension Brother   . Hypertension Brother   . Hypertension Brother   . Hypertension Brother     History  Substance Use Topics  . Smoking status: Former Smoker -- 1.50 packs/day    Quit date: 01/22/2013  . Smokeless  tobacco: Not on file  . Alcohol Use: Yes      Review of Systems ROS: Statement: All systems negative except as marked or noted in the HPI; Constitutional: Negative for fever and chills. ; ; Eyes: Negative for eye pain, redness and discharge. ; ; ENMT: Negative for ear pain, hoarseness, nasal congestion, sinus pressure and sore throat. ; ; Cardiovascular: Negative for chest pain, palpitations, diaphoresis, dyspnea and peripheral edema. ; ; Respiratory: Negative for cough, wheezing and stridor. ; ; Gastrointestinal: Negative for diarrhea, abdominal pain, jaundice. +hematemesis, rectal bleeding. . ; ; Genitourinary: Negative for dysuria, flank pain and hematuria. ; ; Musculoskeletal: Negative for back pain and neck pain. Negative for swelling and trauma.; ; Skin: Negative for pruritus, rash, abrasions, blisters, bruising and skin lesion.; ; Neuro: Negative for headache, lightheadedness and neck stiffness. Negative for weakness, altered level of consciousness , altered mental status, extremity weakness, paresthesias, involuntary movement, seizure and syncope.       Allergies  Review of patient's allergies indicates no known allergies.  Home Medications   Current Outpatient Rx  Name  Route  Sig  Dispense  Refill  . bicalutamide (CASODEX) 50 MG tablet   Oral   Take  50 mg by mouth daily.         . clopidogrel (PLAVIX) 75 MG tablet   Oral   Take 75 mg by mouth daily.         Marland Kitchen docusate sodium (COLACE) 100 MG capsule   Oral   Take 100 mg by mouth 2 (two) times daily.         Marland Kitchen HYDROcodone-acetaminophen (NORCO/VICODIN) 5-325 MG per tablet   Oral   Take 1-2 tablets by mouth every 6 (six) hours as needed for pain.         Marland Kitchen LORazepam (ATIVAN) 2 MG tablet   Oral   Take 1 mg by mouth every 8 (eight) hours as needed for anxiety.         Marland Kitchen menthol-cetylpyridinium (CEPACOL) 3 MG lozenge   Oral   Take 1 lozenge by mouth as needed (For sore throat.).         Marland Kitchen metoprolol succinate  (TOPROL-XL) 25 MG 24 hr tablet   Oral   Take 25 mg by mouth daily.         . Multiple Vitamin (MULTIVITAMIN WITH MINERALS) TABS   Oral   Take 1 tablet by mouth daily.         . nicotine (NICODERM CQ - DOSED IN MG/24 HOURS) 14 mg/24hr patch   Transdermal   Place 1 patch onto the skin daily.         Marland Kitchen olmesartan (BENICAR) 20 MG tablet   Oral   Take 20 mg by mouth daily.         Marland Kitchen omega-3 acid ethyl esters (LOVAZA) 1 G capsule   Oral   Take 2 g by mouth 2 (two) times daily.         Marland Kitchen thiamine 100 MG tablet   Oral   Take 100 mg by mouth daily.           BP 86/53  Pulse 103  Temp(Src) 97.4 F (36.3 C) (Oral)  Resp 16  SpO2 94%  Physical Exam 1015: Physical examination:  Nursing notes reviewed; Vital signs and O2 SAT reviewed;  Constitutional: Well developed, Well nourished, In no acute distress; Head:  Normocephalic, atraumatic; Eyes: EOMI, PERRL, No scleral icterus; ENMT: Mouth and pharynx normal, Mucous membranes dry; Neck: Supple, Full range of motion, No lymphadenopathy; Cardiovascular: Regular rate and rhythm, frequent ectopy. No gallop; Respiratory: Breath sounds clear & equal bilaterally, No wheezes.  Speaking full sentences with ease, Normal respiratory effort/excursion; Chest: Nontender, Movement normal; Abdomen: Soft, Nontender, Nondistended, Normal bowel sounds. Rectal exam performed w/permission of pt and ED RN chaperone present.  Anal tone normal.  Non-tender, soft black-maroon stool in rectal vault, heme positive.  No fissures, no external hemorrhoids, no palp masses.;;; Extremities: Pulses normal, No tenderness, No edema, No calf edema or asymmetry.; Neuro: AA&Ox3, vague historian. Major CN grossly intact.  Speech clear. No gross focal motor or sensory deficits in extremities.; Skin: Color pale, Warm, Dry.   ED Course  Procedures    MDM  MDM Reviewed: previous chart, nursing note and vitals Reviewed previous: ECG and labs Interpretation: ECG, labs  and x-ray Total time providing critical care: 30-74 minutes. This excludes time spent performing separately reportable procedures and services. Consults: gastrointestinal and critical care   CRITICAL CARE Performed by: Laray Anger Total critical care time: 60 Critical care time was exclusive of separately billable procedures and treating other patients. Critical care was necessary to treat or prevent imminent or life-threatening deterioration. Critical  care was time spent personally by me on the following activities: development of treatment plan with patient and/or surrogate as well as nursing, discussions with consultants, evaluation of patient's response to treatment, examination of patient, obtaining history from patient or surrogate, ordering and performing treatments and interventions, ordering and review of laboratory studies, ordering and review of radiographic studies, pulse oximetry and re-evaluation of patient's condition.     Date: 03/16/2013  Rate: 122  Rhythm: atrial fibrillation and premature ventricular contractions (PVC)  QRS Axis: left  Intervals: normal  ST/T Wave abnormalities: normal  Conduction Disutrbances:left anterior fascicular block and nonspecific intraventricular conduction delay  Narrative Interpretation:   Old EKG Reviewed: changes noted; new afib compared to previous EKG's dated 02/06/2013 and 01/23/2013 (both were NSR with frequent PAC's)    Results for orders placed during the hospital encounter of 03/16/13  PROTIME-INR      Result Value Range   Prothrombin Time 12.7  11.6 - 15.2 seconds   INR 0.96  0.00 - 1.49  APTT      Result Value Range   aPTT 29  24 - 37 seconds  CBC WITH DIFFERENTIAL      Result Value Range   WBC 11.4 (*) 4.0 - 10.5 K/uL   RBC 2.65 (*) 4.22 - 5.81 MIL/uL   Hemoglobin 8.2 (*) 13.0 - 17.0 g/dL   HCT 16.1 (*) 09.6 - 04.5 %   MCV 83.8  78.0 - 100.0 fL   MCH 30.9  26.0 - 34.0 pg   MCHC 36.9 (*) 30.0 - 36.0 g/dL   RDW  40.9  81.1 - 91.4 %   Platelets 201  150 - 400 K/uL   Neutrophils Relative 82 (*) 43 - 77 %   Neutro Abs 9.3 (*) 1.7 - 7.7 K/uL   Lymphocytes Relative 14  12 - 46 %   Lymphs Abs 1.6  0.7 - 4.0 K/uL   Monocytes Relative 5  3 - 12 %   Monocytes Absolute 0.5  0.1 - 1.0 K/uL   Eosinophils Relative 0  0 - 5 %   Eosinophils Absolute 0.0  0.0 - 0.7 K/uL   Basophils Relative 0  0 - 1 %   Basophils Absolute 0.0  0.0 - 0.1 K/uL  COMPREHENSIVE METABOLIC PANEL      Result Value Range   Sodium 134 (*) 135 - 145 mEq/L   Potassium 3.2 (*) 3.5 - 5.1 mEq/L   Chloride 100  96 - 112 mEq/L   CO2 15 (*) 19 - 32 mEq/L   Glucose, Bld 147 (*) 70 - 99 mg/dL   BUN 782 (*) 6 - 23 mg/dL   Creatinine, Ser 9.56 (*) 0.50 - 1.35 mg/dL   Calcium 9.7  8.4 - 21.3 mg/dL   Total Protein 6.1  6.0 - 8.3 g/dL   Albumin 2.9 (*) 3.5 - 5.2 g/dL   AST 9  0 - 37 U/L   ALT 5  0 - 53 U/L   Alkaline Phosphatase 55  39 - 117 U/L   Total Bilirubin 0.2 (*) 0.3 - 1.2 mg/dL   GFR calc non Af Amer 15 (*) >90 mL/min   GFR calc Af Amer 17 (*) >90 mL/min  LIPASE, BLOOD      Result Value Range   Lipase 24  11 - 59 U/L  TROPONIN I      Result Value Range   Troponin I <0.30  <0.30 ng/mL  OCCULT BLOOD, POC DEVICE  Result Value Range   Fecal Occult Bld POSITIVE (*) NEGATIVE  TYPE AND SCREEN      Result Value Range   ABO/RH(D) O POS     Antibody Screen NEG     Sample Expiration 03/19/2013     Dg Chest Port 1 View 03/16/2013  *RADIOLOGY REPORT*  Clinical Data: Nausea vomiting.  Chest and upper abdominal pain. Cough.  COPD.  Right lung biopsy/surgery.  PORTABLE CHEST - 1 VIEW  Comparison: 01/20/2013  Findings: Numerous leads and wires project over the chest.  Apical lordotic patient positioning. Midline trachea.  Normal heart size and mediastinal contours for age.  No pleural effusion or pneumothorax.  Mild volume loss and scarring at the right lung base. Minimal volume loss at the left lung base.  IMPRESSION: Bibasilar volume  loss/scarring, without acute cardiopulmonary disease.   Original Report Authenticated By: Jeronimo Greaves, M.D.     Results for DANY, WALTHER (MRN [id number]) as of 03/16/2013 11:59  Ref. Range 01/21/2013 04:14 01/22/2013 05:02 01/23/2013 04:56 01/24/2013 05:16 03/16/2013 10:33  BUN Latest Range: 6-23 mg/dL 21 16 19 24  (H) 138 (H)  Creatinine Latest Range: 0.50-1.35 mg/dL 4.09 8.11 9.14 7.82 9.56 (H)    Results for KYLLE, LALL (MRN [id number]) as of 03/16/2013 11:59  Ref. Range 01/21/2013 04:14 01/22/2013 05:02 01/23/2013 04:56 01/24/2013 05:16 03/16/2013 10:33  Hemoglobin Latest Range: 13.0-17.0 g/dL 46.9 (L) 62.9 (L) 9.8 (L) 9.8 (L) 8.2 (L)  HCT Latest Range: 39.0-52.0 % 35.1 (L) 33.6 (L) 27.0 (L) 29.0 (L) 22.2 (L)    1200:  Pt remains hypotensive, with SBP 80's.  Pt without Hx of CHF per previous Echo in 2010.  Pt given multiple IVF boluses.  He does respond to each IVF bolus (SBP increases to 90's), then drifts back down to 80's.  Stool is maroon/black; IV protonix bolus and gtt started.  Pt has not stooled or vomited while in the ED.  BUN/Cr significantly elevated from previous.  H/H lower than previous; pt type and screened. EKG appears new afib. Pt otherwise appears NAD, resps easy, abd remains benign.  T/C to PCCM Dr. Colletta Maryland, case discussed, including:  HPI, pertinent PM/SHx, VS/PE, dx testing, ED course and treatment: requests to give another 1L IVF bolus, recheck H/H and check lactic acid; then call back in another 1 hour.    1340: H/H now lower than previous. Lactic acid normal. ED RN to start PRBC transfusion.  SBP increased to 100's after another 1L NS IVF bolus. Pt continues A&O, talking with ED staff, resps without distress. Has not stooled or vomited while in ED.  T/C to GI Dr. Matthias Hughs, case discussed, including:  HPI, pertinent PM/SHx, VS/PE, dx testing, ED course and treatment:  Agreeable with ED treatment at this time, to consult, states pt can have EGD approx 1600 today. T/C to PCCM Dr.  Darrol Angel, case discussed, including:  HPI, pertinent PM/SHx, VS/PE, dx testing, ED course and treatment as well as d/w GI MD:  Agreeable to admit, requests he will come to ED for eval.      Laray Anger, DO 03/18/13 1158

## 2013-03-16 NOTE — ED Notes (Signed)
Patient states he has been throwing up x 1 1/2 weeks.  Patient states he "just doesn't feel well".

## 2013-03-16 NOTE — Procedures (Signed)
Central Venous Catheter Insertion Procedure Note OVA MEEGAN [id number] [birth year]  Procedure: Insertion of Central Venous Catheter Indications: Assessment of intravascular volume, Drug and/or fluid administration and Frequent blood sampling  Procedure Details Consent: Risks of procedure as well as the alternatives and risks of each were explained to the (patient/caregiver).  Consent for procedure obtained. Time Out: Verified patient identification, verified procedure, site/side was marked, verified correct patient position, special equipment/implants available, medications/allergies/relevent history reviewed, required imaging and test results available.  Performed  Maximum sterile technique was used including antiseptics, cap, gloves, gown, hand hygiene, mask and sheet. Skin prep: Chlorhexidine; local anesthetic administered A antimicrobial bonded/coated triple lumen catheter was placed in the right internal jugular vein using the Seldinger technique. Ultrasound guidance used.yes Catheter placed to 17 cm. Blood aspirated via all 3 ports and then flushed x 3. Line sutured x 2 and dressing applied.  Evaluation Blood flow good Complications: No apparent complications Patient did tolerate procedure well. Chest X-ray ordered to verify placement.  CXR: pending.  Brett Canales Minor ACNP [facility] P[facility] Pager [phone] till 3 pm If no answer page [phone] 03/16/2013, 3:30 PM   I was present for and supervised the entire procedure  Billy Fischer, MD ; [facility] service Mobile [phone].  After 5:30 PM or weekends, call [phone]

## 2013-03-16 NOTE — ED Notes (Signed)
Patient's blood administration was bumped to 75/hr then 100/hr.  Procedures going to get central line in patient and Endoscopy MD at bedside while blood was switched.

## 2013-03-16 NOTE — Progress Notes (Signed)
Bedside endoscopy was well tolerated, and showed active slow bleeding from the proximal duodenum, which was successfully controlled to the point of complete hemostasis after injection with epinephrine.  I am adding sucralfate to the patient's regimen.  I would consider repeat endoscopy in the event of severe rebleeding.  Please see dictated procedure report for full details.  Matthew Mahoney, M.D. [phone]

## 2013-03-16 NOTE — Op Note (Signed)
[facility] [address] [place], [address]   ENDOSCOPY PROCEDURE REPORT  PATIENT: Matthew, Mahoney  MR#: [id number] BIRTHDATE: [birth year] , 76  yrs. old GENDER: Male ENDOSCOPIST:Dequincy Winnona Wargo, MD REFERRED BY:  unassigned PROCEDURE DATE:  03/16/2013 PROCEDURE:    upper endoscopy with control of hemorrhage and biopsies ASA CLASS: INDICATIONS:   coffee-ground emesis, melena, hemoglobin 7 in a patient on aspirin and Plavix MEDICATION:    none TOPICAL ANESTHETIC:   Cetacaine spray  DESCRIPTION OF PROCEDURE:   the procedure was done at the bedside in the [facility] intensive care unit because of patient's somewhat tenuous medical status with low blood pressure and mild alteration of mental status.  The time out was performed after having discussed the nature, purpose, and risks of the procedure with the patient and his sister. The patient provided written consent. As noted, no sedation was administered so as to minimize the risk of cardiopulmonary instability or drop in blood pressure.  The Pentax video endoscope was passed under direct vision, entering the esophagus without significant difficulty. The vocal cords, however, were not well seen.  The esophagus was normal in its entirety, without evidence of varices, Mallory-Weiss tear, esophagitis, ulceration, neoplasia, or infection. No drainage or stricture was appreciated.  The stomach was entered. It contained a very scant amount of fresh red blood, but it was noticed a small amount of blood was refluxing out of the pylorus. The pylorus itself was normal.  On entering the duodenal bulb, fresh red blood was noted to be welling up from the region of the second portion of the duodenum. A 1.5 cm dirty-based ulcer with some possible oozing  was identified in in the distal aspect of the bulb, and was injected with 2 cc of 1-10,000 epinephrine.  However, most of the bleeding appeared to be  emanating from the second portion of the duodenum. Another, smaller ulcer was identified near the first ulcer, but there was a lot of spasm and edema in the proximal duodenum, inhibiting optimal visualization. I injected another 2 cc of 1-10,000 epinephrine where the blood appeared to be welling up from, and after doing so, there appeared to be complete hemostasis in the proximal duodenum.  There was good  blanching of the mucosafollowing the injection therapy, and as noted, no bleeding was identified the conclusion of the procedure.  Antral biopsies were obtained from the stomach to look for evidence of H. pylori infection.  A retroflexed view of the cardia of the stomach was unremarkable.  The scope was removed from the patient. Despite the absence of sedation, he tolerated it remarkably well and without clinical instability.     COMPLICATIONS: None  ENDOSCOPIC IMPRESSION:  1. Active bleeding from proximal duodenum, with complete hemostasis following epinephrine injection therapy.  2. At least 2 duodenal ulcers identified.  RECOMMENDATIONS:  1. Clinical monitoring and supportive care. 2. Consider repeat endoscopy in the event of significant rebleeding. This patient would not be an optimal candidate for interventional radiology because of his current azotemia. 3. Await pathology on biopsies, and treat if the patient is positive for Helicobacter pylori 4. Maximal antipeptic therapy with Protonix infusion and sucralfate    _______________________________ eSignedBernette Redbird, MD 03/16/2013 5:45 PM    PATIENT NAME:  Matthew Mahoney, Matthew Mahoney MR#: [id number]

## 2013-03-16 NOTE — H&P (Signed)
PULMONARY  / CRITICAL CARE MEDICINE  Name: Matthew Mahoney MRN: [id number] DOB: Apr 10, 1936    ADMISSION DATE:  03/16/2013 CONSULTATION DATE:  4-10  REFERRING MD : EDP PRIMARY SERVICE: PCCM  CHIEF COMPLAINT:  Hypotension , GIB  BRIEF PATIENT DESCRIPTION: [name] who fell and fx left hip on 2-15 and was subsequently dc to a snf. He has failed to improve in the NH and developed lower gib and diarrhea 2 days prior to admit. 1 day prior to admit he had vomited blood. On 4-10 he arrives in [facility] ED wirh hypotension poorly responsive to first round of volume further he has increased creatine 3.4 with baseline of 1.4. Due to his hypotension, renal insuff(on ARB), GIB(onplavix) hx of CAD with stent he will be admitted to ICU. EGD per Dr Ronney Asters is planned for 4-10 @4  pm.  SIGNIFICANT EVENTS / STUDIES:  4-10 egd  LINES / TUBES: 4-10 lt ij cvl>>  CULTURES:   ANTIBIOTICS:   HISTORY OF PRESENT ILLNESS:   [name] who fell and fx left hip on 2-15 and was subsequently dc to a snf. He has failed to improve in the NH and developed lower gib and diarrhea 2 days prior to admit. 1 day prior to admit he had vomited blood. On 4-10 he arrives in [facility] ED wirh hypotension poorly responsive to first round of volume further he has increased creatine 3.4 with baseline of 1.4. Due to his hypotension, renal insuff(on ARB), GIB(onplavix) hx of CAD with stent he will be admitted to ICU. EGD per Dr Ronney Asters is planned for 4-10 @4  pm.  PAST MEDICAL HISTORY :  Past Medical History  Diagnosis Date  . Hypertension   . Edema of leg   . COPD (chronic obstructive pulmonary disease)   . Choledocholithiasis   . Coronary arteriosclerosis   . Hyperlipidemia   . Ichthyosis   . Mitral regurgitation   . Onychomycosis   . Vitamin D deficiency   . Aortic stenosis   . Hyperkalemia   . Hypocalcemia   . Lymphadenopathy   . Prostate cancer   . Status post lung surgery 12/07/2001    BENIGHN LESION ON RIGHT LUNG REMOVED  .  Ascites     HISTORY  . History of colonic diverticulitis   . Olecranon bursitis    Past Surgical History  Procedure Laterality Date  . Hemorrhoid surgery  12/07/1998  . Lung surgery  12/07/2001    BENIGN LESION ON RIGHT LUNG REMOVED  . Colonoscopy    . Femur im nail Left 01/21/2013    Procedure: INTRAMEDULLARY (IM) NAIL FEMORAL;  Surgeon: Velna Ochs, MD;  Location: [facility];  Service: Orthopedics;  Laterality: Left;   Prior to Admission medications   Medication Sig Start Date End Date Taking? Authorizing Provider  bicalutamide (CASODEX) 50 MG tablet Take 50 mg by mouth daily.   Yes Historical Provider, MD  clopidogrel (PLAVIX) 75 MG tablet Take 75 mg by mouth daily.   Yes Historical Provider, MD  docusate sodium (COLACE) 100 MG capsule Take 100 mg by mouth 2 (two) times daily.   Yes Historical Provider, MD  HYDROcodone-acetaminophen (NORCO/VICODIN) 5-325 MG per tablet Take 1-2 tablets by mouth every 6 (six) hours as needed for pain.   Yes Historical Provider, MD  LORazepam (ATIVAN) 2 MG tablet Take 1 mg by mouth every 8 (eight) hours as needed for anxiety.   Yes Historical Provider, MD  menthol-cetylpyridinium (CEPACOL) 3 MG lozenge Take 1 lozenge by mouth  as needed (For sore throat.).   Yes Historical Provider, MD  metoprolol succinate (TOPROL-XL) 25 MG 24 hr tablet Take 25 mg by mouth daily.   Yes Historical Provider, MD  Multiple Vitamin (MULTIVITAMIN WITH MINERALS) TABS Take 1 tablet by mouth daily.   Yes Historical Provider, MD  nicotine (NICODERM CQ - DOSED IN MG/24 HOURS) 14 mg/24hr patch Place 1 patch onto the skin daily.   Yes Historical Provider, MD  olmesartan (BENICAR) 20 MG tablet Take 20 mg by mouth daily.   Yes Historical Provider, MD  omega-3 acid ethyl esters (LOVAZA) 1 G capsule Take 2 g by mouth 2 (two) times daily.   Yes Historical Provider, MD  thiamine 100 MG tablet Take 100 mg by mouth daily.   Yes Historical Provider, MD   No Known Allergies  FAMILY HISTORY:   Family History  Problem Relation Age of Onset  . Hypertension Mother 54  . Hypertension Father 99  . Hypertension Sister   . Hypertension Brother   . Hypertension Sister   . Hypertension Brother   . Hypertension Brother   . Hypertension Brother   . Hypertension Brother    SOCIAL HISTORY:  reports that he quit smoking about 7 weeks ago. He does not have any smokeless tobacco history on file. He reports that  drinks alcohol. He reports that he does not use illicit drugs.  REVIEW OF SYSTEMS:  10 point review of system taken, please see HPI for positives and negatives.   SUBJECTIVE:   VITAL SIGNS: Temp:  [97.4 F (36.3 C)-98.1 F (36.7 C)] 98.1 F (36.7 C) (04/10 1430) Pulse Rate:  [59-103] 84 (04/10 1420) Resp:  [14-25] 22 (04/10 1420) BP: (83-104)/(44-69) 97/55 mmHg (04/10 1430) SpO2:  [94 %-100 %] 100 % (04/10 1420) HEMODYNAMICS:   VENTILATOR SETTINGS:   INTAKE / OUTPUT: Intake/Output   None     PHYSICAL EXAMINATION: General: Frail ewm Neuro:  Mild confusion HEENT:  No LAN Cardiovascular:  HSR RRR Lungs:  Decreased in bases Abdomen:  tender Musculoskeletal:  intact Skin:  warm  LABS:  Recent Labs Lab 03/16/13 1033 03/16/13 1240 03/16/13 1243  HGB 8.2* 7.3*  --   WBC 11.4*  --   --   PLT 201  --   --   NA 134*  --   --   K 3.2*  --   --   CL 100  --   --   CO2 15*  --   --   GLUCOSE 147*  --   --   BUN 138*  --   --   CREATININE 3.68*  --   --   CALCIUM 9.7  --   --   AST 9  --   --   ALT 5  --   --   ALKPHOS 55  --   --   BILITOT 0.2*  --   --   PROT 6.1  --   --   ALBUMIN 2.9*  --   --   APTT 29  --   --   INR 0.96  --   --   LATICACIDVEN  --   --  1.0  TROPONINI <0.30  --   --    No results found for this basename: GLUCAP,  in the last 168 hours  IMAGING: Dg Chest Port 1 View  03/16/2013  *RADIOLOGY REPORT*  Clinical Data: Nausea vomiting.  Chest and upper abdominal pain. Cough.  COPD.  Right lung biopsy/surgery.  PORTABLE  CHEST - 1  VIEW  Comparison: 01/20/2013  Findings: Numerous leads and wires project over the chest.  Apical lordotic patient positioning. Midline trachea.  Normal heart size and mediastinal contours for age.  No pleural effusion or pneumothorax.  Mild volume loss and scarring at the right lung base. Minimal volume loss at the left lung base.  IMPRESSION: Bibasilar volume loss/scarring, without acute cardiopulmonary disease.   Original Report Authenticated By: Jeronimo Greaves, M.D.      ASSESSMENT / PLAN:  CARDIOVASCULAR A: Shock, hemorrhagic H/O CAD/HTN.  P:  Volume resuscitate to CVP goal 8-12, MAP goal > 60 mmHg   GASTROINTESTINAL A:  GIB - likely upper source P:   protonix drip NPO GI consult with endoscopy to follow   RENAL Lab Results  Component Value Date   CREATININE 3.68* 03/16/2013   CREATININE 1.13 01/24/2013   CREATININE 1.10 01/23/2013    A:  Acute renal insuff due to hypotension/hypovolemia and ARB P:   Iv fluids Monitor BMET, Uo   HEMATOLOGIC A:  Blood loss anemia P:  Transfuse as needed Place CVL  NEUROLOGIC A: Mild confusion suspect uremic encephalopathy P:   Monitor CT of head if worsens   PULMONARY A:COPD from life long tobacco  P:   Empiric BDs    ENDOCRINE A:  Mild hyperglycemia P:   Monitor glucose   INFECTIOUS A: No acute process P:       TODAY'S SUMMARY:  [name] who fell and fx left hip on 2-15 and was subsequently dc to a snf. He has failed to improve in the NH and developed lower gib and diarrhea 2 days prior to admit. 1 day prior to admit he had vomited blood. On 4-10 he arrives in [facility] ED wirh hypotension poorly responsive to first round of volume further he has increased creatine 3.4 with baseline of 1.4. Due to his hypotension, renal insuff (on ARB), GIB(on plavix) hx of CAD with stent he will be admitted to ICU. EGD per Dr Ronney Asters is planned for 4-10 @4  pm.  I have personally obtained a history, examined the patient, evaluated  laboratory and imaging results, formulated the assessment and plan and placed orders. CRITICAL CARE: The patient is critically ill with multiple organ systems failure and requires high complexity decision making for assessment and support, frequent evaluation and titration of therapies, application of advanced monitoring technologies and extensive interpretation of multiple databases.  CCM time: 35 mins   Billy Fischer, MD ; [facility] [phone].  After 5:30 PM or weekends, call [phone] Pulmonary and Critical Care Medicine [facility]   03/16/2013, 2:41 PM

## 2013-03-16 NOTE — ED Notes (Signed)
Called pharmacy back on protonix, they advised "Deniece Portela is putting that together right now".

## 2013-03-16 NOTE — Consult Note (Signed)
Referring Provider: Dr. 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 He has no prior history of ulcer disease or GI bleeding. He is on a daily 81 mg aspirin. He is currently in a nursing home rehabbing from a hip fracture several months ago.  With that background, the patient has and having a lot of upper tract symptoms with nausea and vomiting, especially over the past couple of days but to some degree over the past couple of weeks. Today, at the nursing home, he was noted to have coffee grounds in his emesis, as well as black stool. He presented to the emergency room with blood pressure initially in the 70s (by EMS), a hemoglobin level about 3-4 g below baseline (7, versus 10 or 11) and marked elevation of BUN to a level of 138, which is new.  The patient has long-standing dyspeptic symptomatology in a rather diffusely positive review of systems. In any event, in the emergency room, he was noted to have a maroon blackish stool. With fluids, his pressure has come up to between 90 and 100 systolic.  The patient apparently had colonoscopy several years ago, he believes by Dr. Elnoria Howard.   Past Medical History  Diagnosis Date  . Hypertension   . Edema of leg   . COPD (chronic obstructive pulmonary disease)   . Choledocholithiasis   . Coronary arteriosclerosis   . Hyperlipidemia   . Ichthyosis   . Mitral regurgitation   . Onychomycosis   . Vitamin D deficiency   . Aortic stenosis   . Hyperkalemia   . Hypocalcemia   . Lymphadenopathy   . Prostate cancer   . Status post lung surgery 12/07/2001    BENIGHN LESION ON RIGHT LUNG REMOVED  . Ascites     HISTORY  . History of colonic diverticulitis   . Olecranon bursitis     Past Surgical History  Procedure  Laterality Date  . Hemorrhoid surgery  12/07/1998  . Lung surgery  12/07/2001    BENIGN LESION ON RIGHT LUNG REMOVED  . Colonoscopy    . Femur im nail Left 01/21/2013    Procedure: INTRAMEDULLARY (IM) NAIL FEMORAL;  Surgeon: Velna Ochs, MD;  Location: [facility];  Service: Orthopedics;  Laterality: Left;    Prior to Admission medications   Medication Sig Start Date End Date Taking? Authorizing Provider  bicalutamide (CASODEX) 50 MG tablet Take 50 mg by mouth daily.   Yes Historical Provider, MD  clopidogrel (PLAVIX) 75 MG tablet Take 75 mg by mouth daily.   Yes Historical Provider, MD  docusate sodium (COLACE) 100 MG capsule Take 100 mg by mouth 2 (two) times daily.   Yes Historical Provider, MD  HYDROcodone-acetaminophen (NORCO/VICODIN) 5-325 MG per tablet Take 1-2 tablets by mouth every 6 (six) hours as needed for pain.   Yes Historical Provider, MD  LORazepam (ATIVAN) 2 MG tablet Take 1 mg by mouth every 8 (eight) hours as needed for anxiety.   Yes Historical Provider, MD  menthol-cetylpyridinium (CEPACOL) 3 MG lozenge Take 1 lozenge by mouth as needed (For sore throat.).   Yes Historical Provider, MD  metoprolol succinate (TOPROL-XL) 25 MG 24 hr tablet Take 25 mg by mouth daily.   Yes Historical Provider, MD  Multiple Vitamin (MULTIVITAMIN WITH MINERALS) TABS Take 1  tablet by mouth daily.   Yes Historical Provider, MD  nicotine (NICODERM CQ - DOSED IN MG/24 HOURS) 14 mg/24hr patch Place 1 patch onto the skin daily.   Yes Historical Provider, MD  olmesartan (BENICAR) 20 MG tablet Take 20 mg by mouth daily.   Yes Historical Provider, MD  omega-3 acid ethyl esters (LOVAZA) 1 G capsule Take 2 g by mouth 2 (two) times daily.   Yes Historical Provider, MD  thiamine 100 MG tablet Take 100 mg by mouth daily.   Yes Historical Provider, MD    Current Facility-Administered Medications  Medication Dose Route Frequency Provider Last Rate Last Dose  . 0.9 %  sodium chloride infusion   Intravenous  Continuous Laray Anger, DO 75 mL/hr at 03/16/13 1111    . 0.9 %  sodium chloride infusion  250 mL Intravenous PRN Jeanella Craze, NP      . 0.9 %  sodium chloride infusion   Intravenous Continuous Jeanella Craze, NP      . pantoprazole (PROTONIX) 80 mg in sodium chloride 0.9 % 250 mL infusion  8 mg/hr Intravenous Continuous Laray Anger, DO   8 mg/hr at 03/16/13 1153  . pantoprazole (PROTONIX) 80 mg in sodium chloride 0.9 % 250 mL infusion  8 mg/hr Intravenous Continuous Jeanella Craze, NP       Current Outpatient Prescriptions  Medication Sig Dispense Refill  . bicalutamide (CASODEX) 50 MG tablet Take 50 mg by mouth daily.      . clopidogrel (PLAVIX) 75 MG tablet Take 75 mg by mouth daily.      Marland Kitchen docusate sodium (COLACE) 100 MG capsule Take 100 mg by mouth 2 (two) times daily.      Marland Kitchen HYDROcodone-acetaminophen (NORCO/VICODIN) 5-325 MG per tablet Take 1-2 tablets by mouth every 6 (six) hours as needed for pain.      Marland Kitchen LORazepam (ATIVAN) 2 MG tablet Take 1 mg by mouth every 8 (eight) hours as needed for anxiety.      Marland Kitchen menthol-cetylpyridinium (CEPACOL) 3 MG lozenge Take 1 lozenge by mouth as needed (For sore throat.).      Marland Kitchen metoprolol succinate (TOPROL-XL) 25 MG 24 hr tablet Take 25 mg by mouth daily.      . Multiple Vitamin (MULTIVITAMIN WITH MINERALS) TABS Take 1 tablet by mouth daily.      . nicotine (NICODERM CQ - DOSED IN MG/24 HOURS) 14 mg/24hr patch Place 1 patch onto the skin daily.      Marland Kitchen olmesartan (BENICAR) 20 MG tablet Take 20 mg by mouth daily.      Marland Kitchen omega-3 acid ethyl esters (LOVAZA) 1 G capsule Take 2 g by mouth 2 (two) times daily.      Marland Kitchen thiamine 100 MG tablet Take 100 mg by mouth daily.        Allergies as of 03/16/2013  . (No Known Allergies)    Family History  Problem Relation Age of Onset  . Hypertension Mother 38  . Hypertension Father 27  . Hypertension Sister   . Hypertension Brother   . Hypertension Sister   . Hypertension Brother   .  Hypertension Brother   . Hypertension Brother   . Hypertension Brother     History   Social History  . Marital Status: Widowed    Spouse Name: N/A    Number of Children: N/A  . Years of Education: N/A   Occupational History  . Not on file.   Social History Main  Topics  . Smoking status: Former Smoker -- 1.50 packs/day    Quit date: 01/22/2013  . Smokeless tobacco: Not on file  . Alcohol Use: Yes  . Drug Use: No  . Sexually Active: Not Currently   Other Topics Concern  . Not on file   Social History Narrative  . No narrative on file    Review of Systems: Positive for: Anorexia, dysphagia, dyspepsia, and recent nausea and vomiting  Physical Exam: Vital signs in last 24 hours: Temp:  [97.4 F (36.3 C)-98.1 F (36.7 C)] 98.1 F (36.7 C) (04/10 1500) Pulse Rate:  [59-103] 94 (04/10 1500) Resp:  [14-25] 22 (04/10 1455) BP: (83-104)/(44-69) 102/54 mmHg (04/10 1455) SpO2:  [94 %-100 %] 100 % (04/10 1455)   General:   Alert, but somewhat distant affect.   Well-developed, well-nourished, pleasant and cooperative but somewhat restless on the ER stricture, no acute distress.  Head:  Normocephalic and atraumatic. Eyes:  Sclera clear, no icterus.    Mouth:   No ulcerations or lesions.  Oropharynx pink but dry. Very poor dentition.  Lungs:  Clear throughout to auscultation.   No wheezes, crackles, or rhonchi. No evident respiratory distress. Heart:   Regular rate and rhythm; no murmurs, clicks, rubs,  or gallops. Abdomen:  Soft, nontender, nontympanitic, and nondistended. No masses, hepatosplenomegaly or ventral hernias noted. quiet bowel sounds, no  guarding or rebound.   Msk:   Symmetrical without gross deformities. Neurologic:   no obvious focal defects. However, he has a rather distant affect. He is not entirely coherent in his mentation, or at least is not explained answers very much, rather, just mostly yes or no. I question some uremic encephalopathy.  Skin: Very pale   Psych:  Grossly normal mood but mentation and interaction somewhat clouded, as noted above   Intake/Output from previous day:   Intake/Output this shift:    Lab Results:  Recent Labs  03/16/13 1033 03/16/13 1240  WBC 11.4*  --   HGB 8.2* 7.3*  HCT 22.2* 20.0*  PLT 201  --    BMET  Recent Labs  03/16/13 1033  NA 134*  K 3.2*  CL 100  CO2 15*  GLUCOSE 147*  BUN 138*  CREATININE 3.68*  CALCIUM 9.7   LFT  Recent Labs  03/16/13 1033  PROT 6.1  ALBUMIN 2.9*  AST 9  ALT 5  ALKPHOS 55  BILITOT 0.2*   PT/INR  Recent Labs  03/16/13 1033  LABPROT 12.7  INR 0.96     Studies/Results: Dg Chest Port 1 View  03/16/2013  *RADIOLOGY REPORT*  Clinical Data: Nausea vomiting.  Chest and upper abdominal pain. Cough.  COPD.  Right lung biopsy/surgery.  PORTABLE CHEST - 1 VIEW  Comparison: 01/20/2013  Findings: Numerous leads and wires project over the chest.  Apical lordotic patient positioning. Midline trachea.  Normal heart size and mediastinal contours for age.  No pleural effusion or pneumothorax.  Mild volume loss and scarring at the right lung base. Minimal volume loss at the left lung base.  IMPRESSION: Bibasilar volume loss/scarring, without acute cardiopulmonary disease.   Original Report Authenticated By: Jeronimo Greaves, M.D.     Impression: 1. Acute upper GI bleed, most likely due to aspirin gastropathy and magnified by Plavix. 2. Posthemorrhagic anemia, severe 3. Prerenal azotemia, severe   Plan: Endoscopy this afternoon, after volume expansion. I discussed the nature, purpose, and risks of the procedure with the patient and his sister who is at the bedside. He is  agreeable to proceed. Further management to deep depend upon the endoscopic findings.   LOS: 0 days   Bartosz Luginbill,Praneel V  03/16/2013, 3:31 PM

## 2013-03-16 NOTE — ED Notes (Signed)
Notified pharmacy of protonix.

## 2013-03-16 NOTE — ED Notes (Signed)
Blood drawn by team putting in central line.   Tubes were given to  Sperl, RN.   I labeled and gave to phlebotomy who was waiting on the tubes.

## 2013-03-16 NOTE — ED Notes (Signed)
Pt was in rehab for left hip fracture approx 6 weeks ago.

## 2013-03-16 NOTE — ED Notes (Signed)
Per EMS- pt is at [facility] for rehab. Yesterday pt began having coffee ground emesis and positive hemoccult today. Pt was initial bp 78/48 manual.pt has been a. Fib on the monitor with EMS. No documented hx of a fib.  Pt received of NS en route. Pt received 4mg  of zofran. cbg 92

## 2013-03-17 DIAGNOSIS — J449 Chronic obstructive pulmonary disease, unspecified: Secondary | ICD-10-CM

## 2013-03-17 LAB — CBC
HCT: 24.3 % — ABNORMAL LOW (ref 39.0–52.0)
Hemoglobin: 8.3 g/dL — ABNORMAL LOW (ref 13.0–17.0)
MCH: 29.9 pg (ref 26.0–34.0)
MCH: 30.1 pg (ref 26.0–34.0)
MCHC: 36.9 g/dL — ABNORMAL HIGH (ref 30.0–36.0)
MCHC: 36.2 g/dL — ABNORMAL HIGH (ref 30.0–36.0)
MCHC: 36.3 g/dL — ABNORMAL HIGH (ref 30.0–36.0)
MCV: 80.9 fL (ref 78.0–100.0)
Platelets: 134 10*3/uL — ABNORMAL LOW (ref 150–400)
RBC: 2.78 MIL/uL — ABNORMAL LOW (ref 4.22–5.81)
RBC: 2.56 MIL/uL — ABNORMAL LOW (ref 4.22–5.81)
RDW: 15.9 % — ABNORMAL HIGH (ref 11.5–15.5)
RDW: 16 % — ABNORMAL HIGH (ref 11.5–15.5)

## 2013-03-17 LAB — URINE CULTURE: Colony Count: NO GROWTH

## 2013-03-17 LAB — BASIC METABOLIC PANEL
BUN: 116 mg/dL — ABNORMAL HIGH (ref 6–23)
Chloride: 111 mEq/L (ref 96–112)
Creatinine, Ser: 2.72 mg/dL — ABNORMAL HIGH (ref 0.50–1.35)
GFR calc Af Amer: 25 mL/min — ABNORMAL LOW (ref >90)

## 2013-03-17 MED ORDER — PANTOPRAZOLE SODIUM 40 MG PO TBEC
40.0000 mg | DELAYED_RELEASE_TABLET | Freq: Two times a day (BID) | ORAL | Status: DC
  Administered 2013-03-17 – 2013-03-20 (×7): 40 mg via ORAL
  Filled 2013-03-17 (×7): qty 1

## 2013-03-17 MED ORDER — POTASSIUM CHLORIDE 10 MEQ/100ML IV SOLN: 10.0000 meq | INTRAVENOUS | Status: DC

## 2013-03-17 MED ORDER — POTASSIUM CHLORIDE 10 MEQ/50ML IV SOLN
10.0000 meq | INTRAVENOUS | Status: AC
  Administered 2013-03-17 (×3): 10 meq via INTRAVENOUS
  Filled 2013-03-17: qty 150

## 2013-03-17 MED ORDER — POTASSIUM CHLORIDE 10 MEQ/50ML IV SOLN
INTRAVENOUS | Status: AC
  Administered 2013-03-17: 10 meq via INTRAVENOUS
  Filled 2013-03-17: qty 150

## 2013-03-17 MED ORDER — POTASSIUM CHLORIDE 10 MEQ/50ML IV SOLN
10.0000 meq | INTRAVENOUS | Status: AC
  Administered 2013-03-17 (×2): 10 meq via INTRAVENOUS

## 2013-03-17 MED ORDER — DOCUSATE SODIUM 100 MG PO CAPS
100.0000 mg | ORAL_CAPSULE | Freq: Two times a day (BID) | ORAL | Status: DC
  Filled 2013-03-17 (×7): qty 1

## 2013-03-17 MED ORDER — SODIUM CHLORIDE 0.9 % IJ SOLN
10.0000 mL | INTRAMUSCULAR | Status: DC | PRN
  Administered 2013-03-18: 10 mL
  Administered 2013-03-18 – 2013-03-20 (×2): 20 mL

## 2013-03-17 MED FILL — Midazolam HCl Inj 5 MG/ML (Base Equivalent): INTRAMUSCULAR | Qty: 1 | Status: AC

## 2013-03-17 MED FILL — Fentanyl Citrate Inj 0.05 MG/ML: INTRAMUSCULAR | Qty: 2 | Status: AC

## 2013-03-17 MED FILL — Epinephrine HCl Inj 0.1 MG/ML: INTRAMUSCULAR | Qty: 10 | Status: AC

## 2013-03-17 NOTE — Progress Notes (Signed)
Report given to Emelda Brothers, RN at this time on 3 west.

## 2013-03-17 NOTE — Care Management Note (Signed)
    Page 1 of 1   03/17/2013     1:32:24 PM   CARE MANAGEMENT NOTE 03/17/2013  Patient:  Matthew Mahoney, Matthew Mahoney   Account Number:  [id number]  Date Initiated:  03/17/2013  Documentation initiated by:  Avie Arenas  Subjective/Objective Assessment:   GIB   - from SNF     Action/Plan:   Anticipated DC Date:  03/20/2013   Anticipated DC Plan:  SKILLED NURSING FACILITY  In-house referral  Clinical Social Worker      DC Planning Services  CM consult      Choice offered to / List presented to:             Status of service:  In process, will continue to follow Medicare Important Message given?   (If response is "NO", the following Medicare IM given date fields will be blank) Date Medicare IM given:   Date Additional Medicare IM given:    Discharge Disposition:    Per UR Regulation:  Reviewed for med. necessity/level of care/duration of stay  If discussed at Long Length of Stay Meetings, dates discussed:    Comments:  Contact:  Swaim,[name] Relative [phone]  03-17-13 1:30pm Avie Arenas, RNBSN [phone] Recently discharged after fx to [facility] place.  SW consult placed.

## 2013-03-17 NOTE — Progress Notes (Signed)
Name: Matthew Mahoney MRN: [id number] DOB: March 27, 1936 LOS: 1  PCCM RESIDENT DAILY PROGRESS NOTE  History of Present Illness: [name] who fell and fx left hip, s/p ORIF on 2-15 and was subsequently dc to a snf. He developed lower GI bleed and diarrhea 2 days prior to admit with an episode of hematemesis a day prior to admission. On arrival  to [facility] ED, he was hypotensive ,poorly responsive to first round of volume further he has increased creatine 3.4 with baseline of 1.4, and CCM was consulted.   Lines / Drains: 4-10 Left IJ >>   Cultures: None  Antibiotics: None  Tests / Events: 4/10 EGD: At least 2 duodenal ulcers.                   Active bleeding from proximal duodenum, with complete hemostasis following epinephrine  Overnight Events: No bloody BM overnight. 1 small bloody BM this AM.  Subjective: He complains of pain in his left hip, at the surgical site. Denies any abdominal pain, nausea/vomiting.    Vital Signs: Temp:  [97.4 F (36.3 C)-98.3 F (36.8 C)] 97.6 F (36.4 C) (04/11 0359) Pulse Rate:  [37-112] 70 (04/11 0700) Resp:  [14-27] 14 (04/11 0700) BP: (64-124)/(40-99) 87/52 mmHg (04/11 0700) SpO2:  [94 %-100 %] 99 % (04/11 0700) Weight:  [177 lb 14.6 oz (80.7 kg)] 177 lb 14.6 oz (80.7 kg) (04/11 0600) I/O last 3 completed shifts: In: 2200 [I.V.:1750; Blood:350; IV Piggyback:100] Out: 850 [Urine:850]  Physical Examination: General: Frail  Neuro: Mild confusion, (? Baseline), following commands and answering questions appropriately  HEENT: PERRLA Cardiovascular: normal rate, regular rhythm, no murmur Lungs: Decreased in bases  Abdomen: soft, non tender to palpation, + bowel sounds, no organomegaly Musculoskeletal: intact , L hip - surgical site appears clean - no open wounds Skin: warm     Labs and Imaging:   Basic Metabolic Panel:  Recent Labs Lab 03/16/13 1033 03/17/13 0250  NA 134* 140  K 3.2* 2.9*  CL 100 111  CO2 15* 16*  GLUCOSE 147* 105*   BUN 138* 116*  CREATININE 3.68* 2.72*  CALCIUM 9.7 8.7   Liver Function Tests:  Recent Labs Lab 03/16/13 1033  AST 9  ALT 5  ALKPHOS 55  BILITOT 0.2*  PROT 6.1  ALBUMIN 2.9*    Recent Labs Lab 03/16/13 1033  LIPASE 24   CBC:  Recent Labs Lab 03/16/13 1033  03/16/13 2035 03/17/13 0245  WBC 11.4*  < > 10.3 8.3  NEUTROABS 9.3*  --   --   --   HGB 8.2*  < > 9.0* 8.3*  HCT 22.2*  < > 24.5* 22.5*  MCV 83.8  < > 80.9 80.9  PLT 201  < > 143* 134*  < > = values in this interval not displayed. Cardiac Enzymes:  Recent Labs Lab 03/16/13 1033 03/16/13 1548 03/16/13 2035  TROPONINI <0.30 <0.30 <0.30   CBG:  Recent Labs Lab 03/16/13 1606  GLUCAP 109*   Coagulation:  Recent Labs Lab 03/16/13 1033  LABPROT 12.7  INR 0.96   Urinalysis:  Recent Labs Lab 03/16/13 1129  COLORURINE YELLOW  LABSPEC 1.022  PHURINE 5.0  GLUCOSEU NEGATIVE  HGBUR NEGATIVE  BILIRUBINUR SMALL*  KETONESUR NEGATIVE  PROTEINUR NEGATIVE  UROBILINOGEN 0.2  NITRITE NEGATIVE  LEUKOCYTESUR TRACE*       Recent Labs Lab 03/16/13 1243  LATICACIDVEN 1.0    Assessment and Plan:  PULMONARY ASSESSMENT:COPD from life long tobacco .  No active issues PLAN:   Bronchodilators as needed   CARDIOVASCULAR ASSESSMENT: Hemorrhagic shock - resolved H/O HTN PLAN:  Volume resuscitate to CVP goal 8-12, MAP goal > 60 mmHg Did not require pressors in 24 hrs. BP stable with systolic's in 100's Hold plavix   RENAL ASSESSMENT: Acute renal insuff due to hypotension/hypovolemia and ARB. His Cr trended up from baseline 0.9 to 3.68 on admission( 4/10). His Cr has started to trend down 3.68> 2.72( 4/11). Good urine output.  PLAN:   IV fluids  Monitor BMET, anticipate improvement   GASTROINTESTINAL ASSESSMENT:  GIB  2/2 duodenal ulcer , s/p EGD - 4/10 that showed bleeding duodenal ulcer, injected with epinephrine.  PLAN:   protonix drip - change to IV protonix BID Advance diet to  clears- 4/11     HEMATOLOGIC ASSESSMENT:  Acute Blood loss anemia. H and H stable. Presented with Hb of 7.2 on 4/10. Hb- 8.8- 4/11 PLAN:  S/p 2 units PRBC- 4/10 H and H stable.  Change CBC q6 to CBC q12.     INFECTIOUS ASSESSMENT:  No active issues PLAN:   Continue to monitor CBC.   ENDOCRINE ASSESSMENT:  Mild hyperglycemia PLAN:   Monitor glucose  NEUROLOGIC ASSESSMENT:  He is AxOx3, mentating well. ? Dementia. Baseline not clear PLAN:   Continue to monitor    Best practices / Disposition: -->ICU status under PCCM>> Transfer to med- surg bed and to [facility] -->full code -->SCD's for DVT Px -->Protonix for GI Px -->ventilator bundle -->diet -->family updated at bedside  SAWHNEY,MEGHA 03/17/2013, 8:28 AM  Levy Pupa, MD, PhD 03/17/2013, 12:53 PM [facility] [phone] or if no answer [phone]

## 2013-03-17 NOTE — Progress Notes (Signed)
GASTROENTEROLOGY PROGRESS NOTE  Problem:   Bleeding duodenal ulcers with posthemorrhagic anemia  Subjective: Feeling somewhat better today  Objective: Vital signs improved. Hemoglobin stable posttransfusion, actually creeping upward. BUN coming down.  Assessment: Apparent hemostasis status post injection therapy of ulcers yesterday evening.  Plan: Await biopsy results checking for H. pylori, and treat if positive.  I would probably continue PPI therapy, although at the moment, it looks like somehow only the sucralfate got continued, so I will reorder twice-daily oral pantoprazole.  Florencia Reasons, M.D. 03/17/2013 5:23 PM

## 2013-03-17 NOTE — Progress Notes (Signed)
[facility] ADULT ICU REPLACEMENT PROTOCOL FOR AM LAB REPLACEMENT ONLY  The patient does not: apply for the [facility] Adult ICU Electrolyte Replacment Protocol based on the criteria listed below:    4. Abnormal electrolyte(s):K 2.9 5. Ordered repletion with: Currently being replaced 6. If a panic level lab has been reported, has the CCM MD in charge been notified? yes.   Physician:  Dr. Higinio Plan, Yosiel Thieme A 03/17/2013 6:13 AM

## 2013-03-18 DIAGNOSIS — D62 Acute posthemorrhagic anemia: Secondary | ICD-10-CM | POA: Diagnosis present

## 2013-03-18 LAB — BASIC METABOLIC PANEL
BUN: 62 mg/dL — ABNORMAL HIGH (ref 6–23)
Chloride: 112 mEq/L (ref 96–112)
Creatinine, Ser: 1.76 mg/dL — ABNORMAL HIGH (ref 0.50–1.35)
GFR calc Af Amer: 42 mL/min — ABNORMAL LOW (ref >90)
GFR calc non Af Amer: 36 mL/min — ABNORMAL LOW (ref >90)
Glucose, Bld: 101 mg/dL — ABNORMAL HIGH (ref 70–99)

## 2013-03-18 LAB — CBC
HCT: 22.8 % — ABNORMAL LOW (ref 39.0–52.0)
Hemoglobin: 7.6 g/dL — ABNORMAL LOW (ref 13.0–17.0)
MCH: 29.9 pg (ref 26.0–34.0)
MCH: 30.2 pg (ref 26.0–34.0)
MCHC: 35.8 g/dL (ref 30.0–36.0)
MCHC: 36.4 g/dL — ABNORMAL HIGH (ref 30.0–36.0)
MCV: 83.5 fL (ref 78.0–100.0)
MCV: 82.9 fL (ref 78.0–100.0)
Platelets: 122 10*3/uL — ABNORMAL LOW (ref 150–400)
RBC: 2.54 MIL/uL — ABNORMAL LOW (ref 4.22–5.81)
RDW: 15.6 % — ABNORMAL HIGH (ref 11.5–15.5)

## 2013-03-18 MED ORDER — ZOLPIDEM TARTRATE 5 MG PO TABS
5.0000 mg | ORAL_TABLET | Freq: Every evening | ORAL | Status: DC | PRN
  Administered 2013-03-19 (×2): 5 mg via ORAL
  Filled 2013-03-18 (×2): qty 1

## 2013-03-18 MED ORDER — POTASSIUM CHLORIDE CRYS ER 20 MEQ PO TBCR
20.0000 meq | EXTENDED_RELEASE_TABLET | Freq: Once | ORAL | Status: AC
  Administered 2013-03-19: 20 meq via ORAL
  Filled 2013-03-18: qty 1

## 2013-03-18 NOTE — Progress Notes (Signed)
Matthew Mahoney 10:25 AM  Subjective: Patient with continued dark stools but the history is difficult to obtain but no new complaints and his case was discussed with my partner  Objective: Vital signs stable afebrile abdomen is soft nontender slight decrease hemoglobin no repeat BUN  Assessment: GI bleeding secondary to ulcers status post epi injection  Plan: Will continue to observe and await followup BUN and consider repeat endoscopy when necessary  [facility] E

## 2013-03-18 NOTE — Progress Notes (Signed)
[facility] PROGRESS NOTE  Matthew Mahoney ZOX:[id number] DOB: 1936-10-13 DOA: 03/16/2013 PCP: Matthew Ora, MD  Assessment/Plan: Principal Problem:   Hemorrhagic shock Active Problems:   GIB (gastrointestinal bleeding)   Acute renal insufficiency   Uremia   Acute encephalopathy   1. Acute GIB/Shock: Patient presented with hematemesis, melena, and was found to be hypotensive, consistent with acute upper GI bleed and hemorrhagic shock. He was managed with aggressive volume resuscitation in the ICU, as well as blood transfusion, with improvement in hemodynamics. Dr Matthew Maduro Buccinini provided GI consultation and performed an EGD on 03/1013, which revealed active bleeding from proximal duodenum, with complete hemostasis following epinephrine injection therapy. At least 2 duodenal ulcers identified. Managed initially with ivi Protonix infusion. Now changed to BID PPI. No recurrence noted, since. Tolerating clears.  2. Acute Blood loss anemia: This is secondary to #1 above. HB at presentation was 7.2. Transfused 2 units PRBC, with bump in HB to8.8 on 03/17/13. HB has since drifted down, although this may be due to equilibration. Will transfuse 1 unit of PRBC today.   3. ARF: Creatinine was 3.68, BUN 138 on admission, against a known baseline creatinine of 1.13 on 01/24/13. This is consistent with ARF, due to hypotension/hypovolemia and ARB. ARB was discontinued, and with volume resuscitation, Creatinine has started to trend down to 2.72 on 03/17/13. Urine output is good. Continue iv fluids, and follow renal indices.    Code Status: Full Code.  Family Communication:  Disposition Plan: To be determined.    Brief narrative: [name] who fell and fx left hip, s/p ORIF on 01/21/13 and was subsequently discharged to a SNF. He developed lower GI bleed and diarrhea 2 days prior to admission, with an episode of hematemesis a day prior to admission. On arrival to [facility] ED, he was hypotensive, poorly  responsive to first round of volume. Creatine 3.4 with baseline of 1.4, and CCM was consulted.    Consultants:  Dr Matthew Mahoney, GI.   Procedures:  CXRs  EGD 03/16/13.   Antibiotics:  N/A.   HPI/Subjective: No new issues, although stools are still dark. .   Objective: Vital signs in last 24 hours: Temp:  [97.8 F (36.6 C)-98 F (36.7 C)] 98 F (36.7 C) (04/12 0554) Pulse Rate:  [79-85] 79 (04/12 0554) Resp:  [20] 20 (04/11 1411) BP: (109-121)/(53-62) 121/62 mmHg (04/12 0554) SpO2:  [98 %-100 %] 100 % (04/12 0554) Weight:  [80.287 kg (177 lb)] 80.287 kg (177 lb) (04/12 0554) Weight change: -0.413 kg (-14.6 oz) Last BM Date: 03/17/13  Intake/Output from previous day: 04/11 0701 - 04/12 0700 In: 1325 [P.O.:375; I.V.:750; IV Piggyback:200] Out: 1405 [Urine:1400; Stool:5]     Physical Exam: General: Comfortable, alert, communicative, not short of breath at rest.  HEENT:  Moderate clinical pallor, no jaundice, no conjunctival injection or discharge. NECK:  Supple, JVP not seen, no carotid bruits, no palpable lymphadenopathy, no palpable goiter. CHEST:  Clinically clear to auscultation, no wheezes, no crackles. HEART:  Sounds 1 and 2 heard, normal, regular, no murmurs. ABDOMEN:  Full, soft, non-tender, no palpable organomegaly, no palpable masses, normal bowel sounds. GENITALIA:  Not examined. LOWER EXTREMITIES:  No pitting edema, palpable peripheral pulses. MUSCULOSKELETAL SYSTEM:  Generalized osteoarthritic changes, otherwise, normal. CENTRAL NERVOUS SYSTEM:  No focal neurologic deficit on gross examination.  Lab Results:  Recent Labs  03/17/13 2200 03/18/13 0848  WBC 8.3 7.7  HGB 7.7* 7.6*  HCT 21.2* 21.2*  PLT 136* 132*  Recent Labs  03/16/13 1033 03/17/13 0250  NA 134* 140  K 3.2* 2.9*  CL 100 111  CO2 15* 16*  GLUCOSE 147* 105*  BUN 138* 116*  CREATININE 3.68* 2.72*  CALCIUM 9.7 8.7   Recent Results (from the past 240 hour(s))  URINE  CULTURE     Status: None   Collection Time    03/16/13 11:29 AM      Result Value Range Status   Specimen Description URINE, CATHETERIZED   Final   Special Requests NONE   Final   Culture  Setup Time 03/16/2013 12:21   Final   Colony Count NO GROWTH   Final   Culture NO GROWTH   Final   Report Status 03/17/2013 FINAL   Final  MRSA PCR SCREENING     Status: None   Collection Time    03/16/13  4:05 PM      Result Value Range Status   MRSA by PCR NEGATIVE  NEGATIVE Final   Comment:            The GeneXpert MRSA Assay (FDA     approved for NASAL specimens     only), is one component of a     comprehensive MRSA colonization     surveillance program. It is not     intended to diagnose MRSA     infection nor to guide or     monitor treatment for     MRSA infections.     Studies/Results: Dg Chest Port 1 View  03/16/2013  *RADIOLOGY REPORT*  Clinical Data: Central venous catheter placement  PORTABLE CHEST - 1 VIEW  Comparison: 03/16/2013  Findings: New right internal jugular vein central venous catheter has been placed with its tip in the lower SVC and no pneumothorax. Normal heart size.  Clear lungs.  IMPRESSION: Right internal jugular central venous catheter placement with its tip in the SVC and no pneumothorax.   Original Report Authenticated By: Jolaine Click, M.D.     Medications: Scheduled Meds: . docusate sodium  100 mg Oral BID  . pantoprazole  40 mg Oral BID AC  . sucralfate  1 g Oral QID   Continuous Infusions: . sodium chloride 100 mL/hr at 03/17/13 0420   PRN Meds:.sodium chloride, sodium chloride    LOS: 2 days   Oaklie Mahoney,Matthew  [facility] Pager [phone]. If 8PM-8AM, please contact night-coverage at [email or web address], password [facility] 03/18/2013, 12:05 PM  LOS: 2 days

## 2013-03-19 LAB — CBC
HCT: 23.8 % — ABNORMAL LOW (ref 39.0–52.0)
Hemoglobin: 8.6 g/dL — ABNORMAL LOW (ref 13.0–17.0)
MCH: 30.1 pg (ref 26.0–34.0)
MCHC: 36.1 g/dL — ABNORMAL HIGH (ref 30.0–36.0)
MCHC: 35.8 g/dL (ref 30.0–36.0)
MCV: 84.1 fL (ref 78.0–100.0)
MCV: 83.8 fL (ref 78.0–100.0)
Platelets: 117 10*3/uL — ABNORMAL LOW (ref 150–400)
Platelets: 123 10*3/uL — ABNORMAL LOW (ref 150–400)
Platelets: 120 10*3/uL — ABNORMAL LOW (ref 150–400)
RBC: 2.84 MIL/uL — ABNORMAL LOW (ref 4.22–5.81)
RDW: 15.4 % (ref 11.5–15.5)
RDW: 15.4 % (ref 11.5–15.5)
RDW: 15.4 % (ref 11.5–15.5)
WBC: 7 10*3/uL (ref 4.0–10.5)
WBC: 7.8 10*3/uL (ref 4.0–10.5)

## 2013-03-19 LAB — BASIC METABOLIC PANEL
BUN: 53 mg/dL — ABNORMAL HIGH (ref 6–23)
CO2: 18 mEq/L — ABNORMAL LOW (ref 19–32)
Calcium: 8.6 mg/dL (ref 8.4–10.5)
Creatinine, Ser: 1.63 mg/dL — ABNORMAL HIGH (ref 0.50–1.35)

## 2013-03-19 LAB — MAGNESIUM: Magnesium: 1.1 mg/dL — ABNORMAL LOW (ref 1.5–2.5)

## 2013-03-19 MED ORDER — POTASSIUM CHLORIDE CRYS ER 20 MEQ PO TBCR
20.0000 meq | EXTENDED_RELEASE_TABLET | Freq: Two times a day (BID) | ORAL | Status: DC
  Administered 2013-03-19 – 2013-03-20 (×2): 20 meq via ORAL
  Filled 2013-03-19 (×3): qty 1

## 2013-03-19 MED ORDER — MAGNESIUM SULFATE 40 MG/ML IJ SOLN
2.0000 g | Freq: Once | INTRAMUSCULAR | Status: AC
  Administered 2013-03-19: 2 g via INTRAVENOUS
  Filled 2013-03-19: qty 50

## 2013-03-19 NOTE — Progress Notes (Signed)
[facility] PROGRESS NOTE  Assessment/Plan:   Acute blood loss anemia/  *Hemorrhagic shock/  GIB (gastrointestinal bleeding) - Patient presented with hematemesis, melena, and was found to be hypotensive, consistent with acute upper GI bleed and hemorrhagic shock.  - Aggressive volume resuscitation in the ICU, as well as blood transfusion, with improvement in hemodynamics.  - GI consultation and performed an EGD on 03/1013, which revealed active bleeding from proximal duodenum, with complete hemostasis following epinephrine injection therapy. At least 2 duodenal ulcers identified.  - Now changed to BID PPI.    Acute renal insufficiency: - Creatinine was 3.68, BUN 138 on admission - Baseline creatinine of 1.13 on 01/24/13.  - This is consistent with ARF, due to hypotension/hypovolemia and ARB and uper GI bleed.  - ARB was discontinued and will be resume as an outpatient.   Acute Blood loss anemia:  - This is secondary to #1 above. - Hbg continue to improve.   Code Status: Full Code.  Family Communication: none Disposition Plan: awaiting placement   Consultants:  [facility]  Procedures: EGD on 03/1013, which revealed active bleeding from proximal duodenum, with complete hemostasis following epinephrine injection therapy. At least 2 duodenal ulcers identifie  Antibiotics:  None  HPI/Subjective: No complains tolerating diet  Objective: Filed Vitals:   03/18/13 1755 03/18/13 1925 03/18/13 2140 03/19/13 0500  BP: 110/63 118/68 118/62 113/68  Pulse: 75 78 77 72  Temp: 97.3 F (36.3 C) 98.2 F (36.8 C) 98.1 F (36.7 C) 98.1 F (36.7 C)  TempSrc: Oral Oral Oral Oral  Resp: 16 18 18 18   Height:      Weight:      SpO2: 99%  99% 99%    Intake/Output Summary (Last 24 hours) at 03/19/13 1243 Last data filed at 03/19/13 0600  Gross per 24 hour  Intake   1320 ml  Output   1001 ml  Net    319 ml   Filed Weights   03/17/13 0600 03/18/13 0554  Weight: 80.7 kg (177 lb  14.6 oz) 80.287 kg (177 lb)    Exam:  General: Alert, awake, oriented x3, in no acute distress.  HEENT: No bruits, no goiter.  Heart: Regular rate and rhythm, without murmurs, rubs, gallops.  Lungs: Good air movement, bilateral air movement.  Abdomen: Soft, nontender, nondistended, positive bowel sounds.  Neuro: Grossly intact, nonfocal.   Data Reviewed: Basic Metabolic Panel:  Recent Labs Lab 03/16/13 1033 03/17/13 0250 03/18/13 2045 03/19/13 0500  NA 134* 140 139 139  K 3.2* 2.9* 3.3* 3.5  CL 100 111 112 112  CO2 15* 16* 16* 18*  GLUCOSE 147* 105* 101* 102*  BUN 138* 116* 62* 53*  CREATININE 3.68* 2.72* 1.76* 1.63*  CALCIUM 9.7 8.7 8.7 8.6   Liver Function Tests:  Recent Labs Lab 03/16/13 1033  AST 9  ALT 5  ALKPHOS 55  BILITOT 0.2*  PROT 6.1  ALBUMIN 2.9*    Recent Labs Lab 03/16/13 1033  LIPASE 24   No results found for this basename: AMMONIA,  in the last 168 hours CBC:  Recent Labs Lab 03/16/13 1033  03/17/13 2200 03/18/13 0848 03/18/13 2045 03/19/13 0500 03/19/13 1125  WBC 11.4*  < > 8.3 7.7 7.6 7.0 7.2  NEUTROABS 9.3*  --   --   --   --   --   --   HGB 8.2*  < > 7.7* 7.6* 8.3* 8.6* 8.6*  HCT 22.2*  < > 21.2* 21.2* 22.8* 23.8* 24.0*  MCV 83.8  < > 82.8 83.5 82.9 84.1 83.9  PLT 201  < > 136* 132* 122* 117* 123*  < > = values in this interval not displayed. Cardiac Enzymes:  Recent Labs Lab 03/16/13 1033 03/16/13 1548 03/16/13 2035  TROPONINI <0.30 <0.30 <0.30   BNP (last 3 results) No results found for this basename: PROBNP,  in the last 8760 hours CBG:  Recent Labs Lab 03/16/13 1606  GLUCAP 109*    Recent Results (from the past 240 hour(s))  URINE CULTURE     Status: None   Collection Time    03/16/13 11:29 AM      Result Value Range Status   Specimen Description URINE, CATHETERIZED   Final   Special Requests NONE   Final   Culture  Setup Time 03/16/2013 12:21   Final   Colony Count NO GROWTH   Final   Culture NO  GROWTH   Final   Report Status 03/17/2013 FINAL   Final  MRSA PCR SCREENING     Status: None   Collection Time    03/16/13  4:05 PM      Result Value Range Status   MRSA by PCR NEGATIVE  NEGATIVE Final   Comment:            The GeneXpert MRSA Assay (FDA     approved for NASAL specimens     only), is one component of a     comprehensive MRSA colonization     surveillance program. It is not     intended to diagnose MRSA     infection nor to guide or     monitor treatment for     MRSA infections.     Studies: No results found.  Scheduled Meds: . docusate sodium  100 mg Oral BID  . pantoprazole  40 mg Oral BID AC  . sucralfate  1 g Oral QID   Continuous Infusions: . sodium chloride 100 mL/hr at 03/19/13 0437     Marinda Elk  [facility] Pager [phone]. If 8PM-8AM, please contact night-coverage at [email or web address], password [facility] 03/19/2013, 12:43 PM  LOS: 3 days

## 2013-03-19 NOTE — Progress Notes (Signed)
Pt had 5 bts NSVT on tele while lying in bed.  Pt denies any c/o, VSS.  K 3.5.  [facility] paged, awaiting return call.  Will continue to monitor.

## 2013-03-19 NOTE — Progress Notes (Signed)
Matthew Mahoney 12:06 PM  Subjective: Patient doing better without signs of bleeding and no new complaint  Objective: Vital signs stable afebrile no acute distress abdomen is soft nontender hemoglobin stable BUN decreasing slowly  Assessment: Duodenal ulcer with bleed  Plan: Okay with me to slowly advance diet continue pump inhibitors long-term can start blood thinners back in near future however prefer just  plavix and holding aspirin but reevaluate his blood thinners needs and call us if any further question or problem  Binyamin Nelis E

## 2013-03-20 ENCOUNTER — Encounter (HOSPITAL_COMMUNITY): Payer: Self-pay | Admitting: Gastroenterology

## 2013-03-20 LAB — TYPE AND SCREEN
Unit division: 0
Unit division: 0

## 2013-03-20 LAB — MAGNESIUM: Magnesium: 1.4 mg/dL — ABNORMAL LOW (ref 1.5–2.5)

## 2013-03-20 LAB — CBC
HCT: 24.8 % — ABNORMAL LOW (ref 39.0–52.0)
Hemoglobin: 8.9 g/dL — ABNORMAL LOW (ref 13.0–17.0)
MCHC: 35.9 g/dL (ref 30.0–36.0)
RBC: 2.94 MIL/uL — ABNORMAL LOW (ref 4.22–5.81)
WBC: 7.9 10*3/uL (ref 4.0–10.5)

## 2013-03-20 MED ORDER — CLOPIDOGREL BISULFATE 75 MG PO TABS: 75.0000 mg | ORAL_TABLET | Freq: Every day | ORAL | Status: DC

## 2013-03-20 MED ORDER — OLMESARTAN MEDOXOMIL 20 MG PO TABS: 20.0000 mg | ORAL_TABLET | Freq: Every day | ORAL | Status: DC

## 2013-03-20 MED ORDER — METOPROLOL SUCCINATE ER 25 MG PO TB24: 25.0000 mg | ORAL_TABLET | Freq: Every day | ORAL | Status: DC

## 2013-03-20 NOTE — Progress Notes (Signed)
Report called to [facility], Careers adviser; questions answered. Non emergent ambulance requested to transfer patient out. Awaiting EMS.

## 2013-03-20 NOTE — Progress Notes (Signed)
Pt heart rate dropped to 39 nonsustained while in bed.  Pt asymptomatic, heart rate now 52 's.  Will continue to monitor.

## 2013-03-20 NOTE — Progress Notes (Signed)
Resident of [facility] [employer].  Ok per MD for d/c back to facility today.  CSW met with patient- he is agreeable to return and states that he has notified his sister of his return.  Spoke with patient's nurse- she is aware of d/c plan and will call report.  Contacted Tammy at [facility]- bed is available for patient.  No further CSW needs identified.  Lorri Frederick. [place]  [phone]

## 2013-03-20 NOTE — Discharge Summary (Signed)
Physician Discharge Summary  Matthew Mahoney ZOX:[id number] DOB: [birth year] DOA: 03/16/2013  PCP: Matthew Ora, MD  Admit date: 03/16/2013 Discharge date: 03/20/2013  Time spent: 30 minutes  Recommendations for Outpatient Follow-up:  1. Follow up with GI as an outpatient repeat EGD and follow up on H. Pylori. 2. - BP in 2-4 weeks titrate Bp medication as tolerate it. Check a CBC at this time monitor Hbg.  Discharge Diagnoses:  Principal Problem:   Hemorrhagic shock Active Problems:   GIB (gastrointestinal bleeding)   Acute renal insufficiency   Uremia   Acute encephalopathy   Acute blood loss anemia   Discharge Condition: stable  Diet recommendation: Heart healthy diet  Filed Weights   03/17/13 0600 03/18/13 0554 03/20/13 0454  Weight: 80.7 kg (177 lb 14.6 oz) 80.287 kg (177 lb) 87.6 kg (193 lb 2 oz)    History of present illness:  [name] who fell and fx left hip on 2-15 and was subsequently dc to a snf. He has failed to improve in the NH and developed lower gib and diarrhea 2 days prior to admit. 1 day prior to admit he had vomited blood. On 4-10 he arrives in [facility] ED wirh hypotension poorly responsive to first round of volume further he has increased creatine 3.4 with baseline of 1.4. Due to his hypotension, renal insuff(on ARB), GIB(onplavix) hx of CAD with stent he will be admitted to ICU. EGD per Dr Ronney Asters is planned for 4-10 @4  pm.   Hospital Course:  Acute blood loss anemia/ *Hemorrhagic shock/ GIB (gastrointestinal bleeding)  - Patient presented with hematemesis, melena, and was found to be hypotensive, consistent with acute upper GI bleed and hemorrhagic shock.  - Aggressive volume resuscitation in the ICU, as well as blood transfusion, with improvement in hemodynamics.  - GI consultation and performed an EGD on 03/1013, which revealed active bleeding from proximal duodenum, with complete hemostasis following epinephrine injection therapy. At least 2 duodenal ulcers  identified.  - Now changed to BID PPI.  - follow up on H. Pylori result as an outpatient.  Acute renal insufficiency:  - Creatinine was 3.68, BUN 138 on admission  - Baseline creatinine of 1.13 on 01/24/13.  - This is consistent with ARF, due to hypotension/hypovolemia and ARB and uper GI bleed.  - ARB was discontinued and will be resume as an outpatient.   Acute Blood loss anemia:  - This is secondary to #1 above.  - Hbg continue to improve.  - CBC as an outpatient.   Procedures:  EGD   Consultations:  Dr. Clent Ridges  Discharge Exam: Filed Vitals:   03/19/13 0500 03/19/13 1319 03/19/13 2100 03/20/13 0454  BP: 113/68 134/74 115/79 113/68  Pulse: 72 101 76 80  Temp: 98.1 F (36.7 C) 98.2 F (36.8 C) 97.8 F (36.6 C) 97.9 F (36.6 C)  TempSrc: Oral Oral Oral Oral  Resp: 18 18 18 18   Height:      Weight:    87.6 kg (193 lb 2 oz)  SpO2: 99% 100% 100% 99%    General: A&O x3 Cardiovascular: RRR Respiratory: good air movement CTA B/L  Discharge Instructions  Discharge Orders   Future Orders Complete By Expires     Diet - low sodium heart healthy  As directed     Increase activity slowly  As directed         Medication List    TAKE these medications       bicalutamide 50 MG tablet  Commonly known as:  CASODEX  Take 50 mg by mouth daily.     clopidogrel 75 MG tablet  Commonly known as:  PLAVIX  Take 1 tablet (75 mg total) by mouth daily.     docusate sodium 100 MG capsule  Commonly known as:  COLACE  Take 100 mg by mouth 2 (two) times daily.     HYDROcodone-acetaminophen 5-325 MG per tablet  Commonly known as:  NORCO/VICODIN  Take 1-2 tablets by mouth every 6 (six) hours as needed for pain.     LORazepam 2 MG tablet  Commonly known as:  ATIVAN  Take 1 mg by mouth every 8 (eight) hours as needed for anxiety.     menthol-cetylpyridinium 3 MG lozenge  Commonly known as:  CEPACOL  Take 1 lozenge by mouth as needed (For sore throat.).     metoprolol  succinate 25 MG 24 hr tablet  Commonly known as:  TOPROL-XL  Take 1 tablet (25 mg total) by mouth daily.     multivitamin with minerals Tabs  Take 1 tablet by mouth daily.     nicotine 14 mg/24hr patch  Commonly known as:  NICODERM CQ - dosed in mg/24 hours  Place 1 patch onto the skin daily.     olmesartan 20 MG tablet  Commonly known as:  BENICAR  Take 1 tablet (20 mg total) by mouth daily.     omega-3 acid ethyl esters 1 G capsule  Commonly known as:  LOVAZA  Take 2 g by mouth 2 (two) times daily.     thiamine 100 MG tablet  Take 100 mg by mouth daily.           Follow-up Information   Follow up with ANDY,CAMILLE L, MD In 2 weeks.   Contact information:   [address] [place] [address] [phone]       Follow up with Florencia Reasons, MD In 1 week. (hospital follow up)    Contact information:   [address]                         [place] [address] [phone]        The results of significant diagnostics from this hospitalization (including imaging, microbiology, ancillary and laboratory) are listed below for reference.    Significant Diagnostic Studies: Dg Chest Port 1 View  03/16/2013  *RADIOLOGY REPORT*  Clinical Data: Central venous catheter placement  PORTABLE CHEST - 1 VIEW  Comparison: 03/16/2013  Findings: New right internal jugular vein central venous catheter has been placed with its tip in the lower SVC and no pneumothorax. Normal heart size.  Clear lungs.  IMPRESSION: Right internal jugular central venous catheter placement with its tip in the SVC and no pneumothorax.   Original Report Authenticated By: Jolaine Click, M.D.    Dg Chest Port 1 View  03/16/2013  *RADIOLOGY REPORT*  Clinical Data: Nausea vomiting.  Chest and upper abdominal pain. Cough.  COPD.  Right lung biopsy/surgery.  PORTABLE CHEST - 1 VIEW  Comparison: 01/20/2013  Findings: Numerous leads and wires project over the chest.  Apical lordotic  patient positioning. Midline trachea.  Normal heart size and mediastinal contours for age.  No pleural effusion or pneumothorax.  Mild volume loss and scarring at the right lung base. Minimal volume loss at the left lung base.  IMPRESSION: Bibasilar volume loss/scarring, without acute cardiopulmonary disease.   Original Report Authenticated By: Ronaldo Miyamoto  Reche Dixon, M.D.     Microbiology: Recent Results (from the past 240 hour(s))  URINE CULTURE     Status: None   Collection Time    03/16/13 11:29 AM      Result Value Range Status   Specimen Description URINE, CATHETERIZED   Final   Special Requests NONE   Final   Culture  Setup Time 03/16/2013 12:21   Final   Colony Count NO GROWTH   Final   Culture NO GROWTH   Final   Report Status 03/17/2013 FINAL   Final  MRSA PCR SCREENING     Status: None   Collection Time    03/16/13  4:05 PM      Result Value Range Status   MRSA by PCR NEGATIVE  NEGATIVE Final   Comment:            The GeneXpert MRSA Assay (FDA     approved for NASAL specimens     only), is one component of a     comprehensive MRSA colonization     surveillance program. It is not     intended to diagnose MRSA     infection nor to guide or     monitor treatment for     MRSA infections.     Labs: Basic Metabolic Panel:  Recent Labs Lab 03/16/13 1033 03/17/13 0250 03/18/13 2045 03/19/13 0500 03/19/13 2130 03/20/13 0900  NA 134* 140 139 139  --   --   K 3.2* 2.9* 3.3* 3.5  --   --   CL 100 111 112 112  --   --   CO2 15* 16* 16* 18*  --   --   GLUCOSE 147* 105* 101* 102*  --   --   BUN 138* 116* 62* 53*  --   --   CREATININE 3.68* 2.72* 1.76* 1.63*  --   --   CALCIUM 9.7 8.7 8.7 8.6  --   --   MG  --   --   --   --  1.1* 1.4*   Liver Function Tests:  Recent Labs Lab 03/16/13 1033  AST 9  ALT 5  ALKPHOS 55  BILITOT 0.2*  PROT 6.1  ALBUMIN 2.9*    Recent Labs Lab 03/16/13 1033  LIPASE 24   No results found for this basename: AMMONIA,  in the last 168  hours CBC:  Recent Labs Lab 03/16/13 1033  03/18/13 2045 03/19/13 0500 03/19/13 1125 03/19/13 2033 03/20/13 0900  WBC 11.4*  < > 7.6 7.0 7.2 7.8 7.9  NEUTROABS 9.3*  --   --   --   --   --   --   HGB 8.2*  < > 8.3* 8.6* 8.6* 8.5* 8.9*  HCT 22.2*  < > 22.8* 23.8* 24.0* 23.8* 24.8*  MCV 83.8  < > 82.9 84.1 83.9 83.8 84.4  PLT 201  < > 122* 117* 123* 120* 131*  < > = values in this interval not displayed. Cardiac Enzymes:  Recent Labs Lab 03/16/13 1033 03/16/13 1548 03/16/13 2035  TROPONINI <0.30 <0.30 <0.30   BNP: BNP (last 3 results) No results found for this basename: PROBNP,  in the last 8760 hours CBG:  Recent Labs Lab 03/16/13 1606  GLUCAP 109*       Signed:  FELIZ Mahoney, ABRAHAM  [facility] 03/20/2013, 10:30 AM

## 2013-03-20 NOTE — Progress Notes (Signed)
Physical Therapy Evaluation Patient Details Name: Matthew Mahoney MRN: [id number] DOB: 03-May-1936 Today's Date: 03/20/2013 Time: 1430-1506 PT Time Calculation (min): 36 min  PT Assessment / Plan / Recommendation Clinical Impression  Pt is 77 yo male s/p GI bleed who continues to have bed linens soaked with blood and stool, does not have bowel control. Pt fatigues quickly with activity, ambulated 20' with RW and min A, +1 for IV pole and safety. Recommend return to SNF for continuing rehab and PT will follow acutely.    PT Assessment  Patient needs continued PT services    Follow Up Recommendations  SNF;Supervision/Assistance - 24 hour    Does the patient have the potential to tolerate intense rehabilitation      Barriers to Discharge None      Equipment Recommendations  None recommended by PT    Recommendations for Other Services     Frequency Min 2X/week    Precautions / Restrictions Precautions Precautions: Fall Restrictions Weight Bearing Restrictions: No Other Position/Activity Restrictions: recent LLE fracture, WBAT now   Pertinent Vitals/Pain No c/o pain      Mobility  Bed Mobility Bed Mobility: Rolling Left;Left Sidelying to Sit;Sitting - Scoot to [place] of Bed Rolling Left: 4: Min assist Left Sidelying to Sit: 1: +2 Total assist;With rails Left Sidelying to Sit: Patient Percentage: 50% Sitting - Scoot to Edge of Bed: 4: Min assist Details for Bed Mobility Assistance: pt has difficulty getting into full sidelying and cannot get legs off bed. Assist at legs and trunk to perform SL to sit Transfers Transfers: Sit to Stand;Stand to Sit Sit to Stand: From elevated surface;4: Min assist Stand to Sit: 4: Min assist;To chair/3-in-1;With upper extremity assist Details for Transfer Assistance: min A to steady, increased effort to push knees into full extension to reach upright posture, especially on the left Ambulation/Gait Ambulation/Gait Assistance: 4: Min  assist Ambulation Distance (Feet): 20 Feet Assistive device: Rolling walker Ambulation/Gait Assistance Details: vc's for upright posture, pt fatigues quickly, could not ambulate further than this today. Min A to steady within RW. Gait Pattern: Step-through pattern;Decreased stride length;Trunk flexed Gait velocity: decreased Stairs: No Wheelchair Mobility Wheelchair Mobility: No    Exercises General Exercises - Lower Extremity Ankle Circles/Pumps: AROM;10 reps;Supine Heel Slides: AROM;5 reps;Supine;Both   PT Diagnosis: Generalized weakness;Difficulty walking  PT Problem List: Decreased strength;Decreased activity tolerance;Decreased balance;Decreased mobility;Decreased range of motion;Decreased knowledge of precautions PT Treatment Interventions: DME instruction;Gait training;Functional mobility training;Therapeutic activities;Therapeutic exercise;Balance training;Patient/family education   PT Goals Acute Rehab PT Goals PT Goal Formulation: With patient Time For Goal Achievement: 04/03/13 Potential to Achieve Goals: Good Pt will go Supine/Side to Sit: with supervision PT Goal: Supine/Side to Sit - Progress: Goal set today Pt will go Sit to Supine/Side: with supervision PT Goal: Sit to Supine/Side - Progress: Goal set today Pt will go Sit to Stand: with supervision PT Goal: Sit to Stand - Progress: Goal set today Pt will go Stand to Sit: with supervision PT Goal: Stand to Sit - Progress: Goal set today Pt will Ambulate: with supervision;51 - 150 feet;with rolling walker PT Goal: Ambulate - Progress: Goal set today  Visit Information  Last PT Received On: 03/20/13 Assistance Needed: +2 (safety)    Subjective Data  Subjective: I would really like to go back to my apt and not back to the rehab center but I know I need to Patient Stated Goal: return home eventually   Prior Functioning  Home Living Lives With: Alone Available Help at  Discharge: Skilled Nursing Facility Type of  Home: Skilled Nursing Facility Home Access: Level entry Home Layout: One level Home Adaptive Equipment: Walker - rolling Prior Function Level of Independence: Needs assistance Needs Assistance: Bathing;Meal Prep;Light Housekeeping Able to Take Stairs?: No Driving: No Vocation: Retired Comments: pt reports that he had progressed to ambulating around whole building, with RW, at [employer] center before this admit Communication Communication: No difficulties    Cognition  Cognition Overall Cognitive Status: Appears within functional limits for tasks assessed/performed Arousal/Alertness: Awake/alert Orientation Level: Oriented X4 / Intact Behavior During Session: WFL for tasks performed Cognition - Other Comments: pt was alert and oriented but he is determined to be independent and would not be surprised if he had some decreased insight into limitations due to his motivation to get back home    Extremity/Trunk Assessment Right Upper Extremity Assessment RUE ROM/Strength/Tone: [facility] for tasks assessed Left Upper Extremity Assessment LUE ROM/Strength/Tone: WFL for tasks assessed Right Lower Extremity Assessment RLE ROM/Strength/Tone: Deficits RLE ROM/Strength/Tone Deficits: generalized weakness noted, especially at hip, 2/5 flexion RLE Sensation: WFL - Light Touch RLE Coordination: WFL - gross motor Left Lower Extremity Assessment LLE ROM/Strength/Tone: Deficits LLE ROM/Strength/Tone Deficits: weaker than right side since fracture, grossly 2-/5 LLE Sensation: WFL - Light Touch LLE Coordination: WFL - gross motor Trunk Assessment Trunk Assessment: Normal   Balance Balance Balance Assessed: No  End of Session PT - End of Session Equipment Utilized During Treatment: Gait belt Activity Tolerance: Patient limited by fatigue Patient left: in chair;with call bell/phone within reach Nurse Communication: Mobility status  GP   Lyanne Co, PT  Acute Rehab Services  [phone]   Lyanne Co 03/20/2013, 4:05 PM

## 2013-03-23 ENCOUNTER — Encounter: Payer: Self-pay | Admitting: Internal Medicine

## 2013-03-23 ENCOUNTER — Non-Acute Institutional Stay (SKILLED_NURSING_FACILITY): Payer: Medicare Other | Admitting: Internal Medicine

## 2013-03-23 DIAGNOSIS — S72142D Displaced intertrochanteric fracture of left femur, subsequent encounter for closed fracture with routine healing: Secondary | ICD-10-CM

## 2013-03-23 DIAGNOSIS — F17200 Nicotine dependence, unspecified, uncomplicated: Secondary | ICD-10-CM

## 2013-03-23 DIAGNOSIS — D62 Acute posthemorrhagic anemia: Secondary | ICD-10-CM

## 2013-03-23 DIAGNOSIS — I10 Essential (primary) hypertension: Secondary | ICD-10-CM

## 2013-03-23 DIAGNOSIS — S72009D Fracture of unspecified part of neck of unspecified femur, subsequent encounter for closed fracture with routine healing: Secondary | ICD-10-CM

## 2013-03-23 DIAGNOSIS — K269 Duodenal ulcer, unspecified as acute or chronic, without hemorrhage or perforation: Secondary | ICD-10-CM

## 2013-03-23 NOTE — Progress Notes (Signed)
Patient ID: Matthew Mahoney, male   DOB: 1936/10/07, 77 y.o.   MRN: [id number]  PCP: Willow Ora, MD  Code Status: full code  No Known Allergies  Chief Complaint: readmit post hospitalization 03/16/13- 03/20/13  HPI:  77 y/o male patient is here post left hip fracture for STR. He was readmitted to the hospital on above date with hematemesis, hematochezia and was hypotensive on admission. He had ARF due to hypovolemia and hypoperfusion and was admitted to ICU. Gi was consulted. He was aggresively fluid resuscitated and also got blood transfusion.EGD was performed on 03/1013 and showed active bleeding from proximal duodenum, with complete hemostasis following epinephrine injection therapy. At least 2 duodenal ulcers identified. He was on PPI drip and later switched to po PPI. Once stable, he was sent back to SNF for completion of STR. He was sene in his room today with his friend present. He mentions doing well overall. Denies any complaints. Feels his strength has improved. Denies any blood in stool. Appetite is improving.no chest pain Review of Systems:  Review of Systems  Constitutional: Positive for malaise/fatigue. Negative for fever, chills and diaphoresis.  HENT: Negative for congestion and neck pain.   Eyes: Negative for blurred vision.  Respiratory: Negative for cough, hemoptysis, sputum production and shortness of breath.   Cardiovascular: Negative for chest pain, palpitations, orthopnea and leg swelling.  Gastrointestinal: Negative for heartburn, nausea, vomiting, abdominal pain, diarrhea, constipation, blood in stool and melena.  Genitourinary: Negative for dysuria.  Musculoskeletal: Positive for joint pain. Negative for myalgias and falls.  Skin: Negative for itching and rash.  Neurological: Positive for weakness. Negative for dizziness, focal weakness and headaches.  Psychiatric/Behavioral: Negative for depression and memory loss.    Past Medical History  Diagnosis Date  .  Hypertension   . Edema of leg   . COPD (chronic obstructive pulmonary disease)   . Choledocholithiasis   . Coronary arteriosclerosis   . Hyperlipidemia   . Ichthyosis   . Mitral regurgitation   . Onychomycosis   . Vitamin D deficiency   . Aortic stenosis   . Hyperkalemia   . Hypocalcemia   . Lymphadenopathy   . Prostate cancer   . Status post lung surgery 12/07/2001    BENIGHN LESION ON RIGHT LUNG REMOVED  . Ascites     HISTORY  . History of colonic diverticulitis   . Olecranon bursitis    Past Surgical History  Procedure Laterality Date  . Hemorrhoid surgery  12/07/1998  . Lung surgery  12/07/2001    BENIGN LESION ON RIGHT LUNG REMOVED  . Colonoscopy    . Femur im nail Left 01/21/2013    Procedure: INTRAMEDULLARY (IM) NAIL FEMORAL;  Surgeon: Velna Ochs, MD;  Location: [facility] ORS;  Service: Orthopedics;  Laterality: Left;  . Esophagogastroduodenoscopy N/A 03/16/2013    Procedure: ESOPHAGOGASTRODUODENOSCOPY (EGD);  Surgeon: Florencia Reasons, MD;  Location: [facility] ENDOSCOPY;  Service: Endoscopy;  Laterality: N/A;  will probably want to do at bedside in ICU---bed placement pending   Social History:   reports that he has been smoking Cigarettes.  He has been smoking about 1.50 packs per day. He does not have any smokeless tobacco history on file. He reports that  drinks alcohol. He reports that he does not use illicit drugs.  Family History  Problem Relation Age of Onset  . Hypertension Mother 81  . Hypertension Father 56  . Hypertension Sister   . Hypertension Brother   . Hypertension Sister   .  Hypertension Brother   . Hypertension Brother   . Hypertension Brother   . Hypertension Brother     Medications: Patient's Medications  New Prescriptions   No medications on file  Previous Medications   BICALUTAMIDE (CASODEX) 50 MG TABLET    Take 50 mg by mouth daily.   CLOPIDOGREL (PLAVIX) 75 MG TABLET    Take 1 tablet (75 mg total) by mouth daily.   DOCUSATE SODIUM (COLACE) 100 MG  CAPSULE    Take 100 mg by mouth 2 (two) times daily.   HYDROCODONE-ACETAMINOPHEN (NORCO/VICODIN) 5-325 MG PER TABLET    Take 1-2 tablets by mouth every 6 (six) hours as needed for pain.   LORAZEPAM (ATIVAN) 2 MG TABLET    Take 1 mg by mouth every 8 (eight) hours as needed for anxiety.   MENTHOL-CETYLPYRIDINIUM (CEPACOL) 3 MG LOZENGE    Take 1 lozenge by mouth as needed (For sore throat.).   METOPROLOL SUCCINATE (TOPROL-XL) 25 MG 24 HR TABLET    Take 1 tablet (25 mg total) by mouth daily.   MULTIPLE VITAMIN (MULTIVITAMIN WITH MINERALS) TABS    Take 1 tablet by mouth daily.   NICOTINE (NICODERM CQ - DOSED IN MG/24 HOURS) 14 MG/24HR PATCH    Place 1 patch onto the skin daily.   OLMESARTAN (BENICAR) 20 MG TABLET    Take 1 tablet (20 mg total) by mouth daily.   OMEGA-3 ACID ETHYL ESTERS (LOVAZA) 1 G CAPSULE    Take 2 g by mouth 2 (two) times daily.   THIAMINE 100 MG TABLET    Take 100 mg by mouth daily.  Modified Medications   No medications on file  Discontinued Medications   No medications on file   Consultations:  Dr. Clent Ridges Procedures:  EGD  Physical Exam: Filed Vitals:   03/23/13 1346  BP: 110/68  Pulse: 60  Temp: 98.2 F (36.8 C)  Resp: 18  SpO2: 96%   Physical Exam  Constitutional: He is oriented to person, place, and time. He appears well-developed and well-nourished. No distress.  HENT:  Head: Normocephalic and atraumatic.  Mouth/Throat: Oropharynx is clear and moist.  Eyes: Conjunctivae are normal. Pupils are equal, round, and reactive to light. Scleral icterus is present.  Neck: Normal range of motion. Neck supple. No JVD present. No tracheal deviation present.  Cardiovascular: Normal rate, regular rhythm and normal heart sounds.   Pulmonary/Chest: Effort normal and breath sounds normal.  Abdominal: Soft. Bowel sounds are normal.  Musculoskeletal: Normal range of motion. He exhibits edema.  Lymphadenopathy:    He has no cervical adenopathy.  Neurological: He is  alert and oriented to person, place, and time.  Skin: Skin is warm and dry. No rash noted. He is not diaphoretic. No erythema. There is pallor.  Psychiatric: He has a normal mood and affect.    Labs reviewed: Basic Metabolic Panel:  Recent Labs  16/10/96 0414  03/17/13 0250 03/18/13 2045 03/19/13 0500 03/19/13 2130 03/20/13 0900  NA 129*  < > 140 139 139  --   --   K 4.3  < > 2.9* 3.3* 3.5  --   --   CL 93*  < > 111 112 112  --   --   CO2 21  < > 16* 16* 18*  --   --   GLUCOSE 103*  < > 105* 101* 102*  --   --   BUN 21  < > 116* 62* 53*  --   --  CREATININE 1.11  < > 2.72* 1.76* 1.63*  --   --   CALCIUM 9.6  < > 8.7 8.7 8.6  --   --   MG 1.3*  --   --   --   --  1.1* 1.4*  < > = values in this interval not displayed. Liver Function Tests:  Recent Labs  01/21/13 0414 01/22/13 0502 03/16/13 1033  AST 16 12 9   ALT 10 9 5   ALKPHOS 59 52 55  BILITOT 0.6 0.4 0.2*  PROT 6.9 6.5 6.1  ALBUMIN 3.6 3.1* 2.9*    Recent Labs  03/16/13 1033  LIPASE 24   No results found for this basename: AMMONIA,  in the last 8760 hours CBC:  Recent Labs  01/20/13 2057  03/16/13 1033  03/19/13 1125 03/19/13 2033 03/20/13 0900  WBC 11.2*  < > 11.4*  < > 7.2 7.8 7.9  NEUTROABS 8.5*  --  9.3*  --   --   --   --   HGB 12.7*  < > 8.2*  < > 8.6* 8.5* 8.9*  HCT 36.4*  < > 22.2*  < > 24.0* 23.8* 24.8*  MCV 93.8  < > 83.8  < > 83.9 83.8 84.4  PLT 245  < > 201  < > 123* 120* 131*  < > = values in this interval not displayed. Cardiac Enzymes:  Recent Labs  03/16/13 1033 03/16/13 1548 03/16/13 2035  TROPONINI <0.30 <0.30 <0.30   BNP: No components found with this basename: POCBNP,  CBG:  Recent Labs  03/16/13 1606  GLUCAP 109*    Radiological Exams: Significant Diagnostic Studies: Dg Chest Port 1 View  03/16/2013  *RADIOLOGY REPORT*  Clinical Data: Central venous catheter placement  PORTABLE CHEST - 1 VIEW  Comparison: 03/16/2013  Findings: New right internal jugular vein  central venous catheter has been placed with its tip in the lower SVC and no pneumothorax. Normal heart size.  Clear lungs.  IMPRESSION: Right internal jugular central venous catheter placement with its tip in the SVC and no pneumothorax.   Original Report Authenticated By: Jolaine Click, M.D.    Dg Chest Port 1 View  03/16/2013  *RADIOLOGY REPORT*  Clinical Data: Nausea vomiting.  Chest and upper abdominal pain. Cough.  COPD.  Right lung biopsy/surgery.  PORTABLE CHEST - 1 VIEW  Comparison: 01/20/2013  Findings: Numerous leads and wires project over the chest.  Apical lordotic patient positioning. Midline trachea.  Normal heart size and mediastinal contours for age.  No pleural effusion or pneumothorax.  Mild volume loss and scarring at the right lung base. Minimal volume loss at the left lung base.  IMPRESSION: Bibasilar volume loss/scarring, without acute cardiopulmonary disease.   Original Report Authenticated By: Jeronimo Greaves, M.D.    Assessment/Plan Duodenal ulcers- confirmed with EGD. Biopsy result pending. Patent currently not on any PPI or H2 blocker. Reviewed gi consult note and recommendation for sucralfate and pantprazole bid. Will start him on pantoprazole 40 mg bid and sucralfate 1 g bid until seen by GI. Will make gi appintment in 1 week with Dr Warrick Parisian  Anemia due to gi bleed- hb/hct on discharge was low. Patient is pale on exam and has been off PPI for a week now. Will recheck cbc to rule out ongoing bleed. Hold plavix for now until follow up with gi.  Prostate cancer- continue casodex current regimen  Left hip fracture- working with PT/OT, pain under control with current regimen, fall precautions and continue exercises  HTN- bp stable with current regimen, monitor, check bmp  Tobacco user- encouraged to stop smoking but pt continues to smoke outside the builiding  Labs/tests ordered- cbc, cmp

## 2013-03-27 ENCOUNTER — Non-Acute Institutional Stay (SKILLED_NURSING_FACILITY): Payer: Medicare Other | Admitting: Nurse Practitioner

## 2013-03-27 DIAGNOSIS — L738 Other specified follicular disorders: Secondary | ICD-10-CM

## 2013-03-27 DIAGNOSIS — L853 Xerosis cutis: Secondary | ICD-10-CM

## 2013-03-27 DIAGNOSIS — R609 Edema, unspecified: Secondary | ICD-10-CM

## 2013-03-27 DIAGNOSIS — R600 Localized edema: Secondary | ICD-10-CM

## 2013-03-27 MED ORDER — FUROSEMIDE 40 MG PO TABS: 40.0000 mg | ORAL_TABLET | Freq: Every day | ORAL | Status: DC

## 2013-03-27 MED ORDER — AQUAPHOR EX OINT: TOPICAL_OINTMENT | Freq: Every day | CUTANEOUS | Status: DC

## 2013-03-27 NOTE — Progress Notes (Signed)
  Subjective:    Patient ID: Matthew Mahoney, male    DOB: 1936-02-21, 77 y.o.   MRN: [id number]  HPI Comments: I was asked to see pt today for bilateral lower extremity swelling. Pt said his sister took him out of facility on Friday to get a haircut and he thinks the swelling is because it was busy and he had to wait in line a long time. He says that his legs "hurt". Denies numbness and tingling. Has been afebrile. The sin on both his feet are dry and scaly.     Review of Systems  Constitutional: Negative.   HENT: Negative.   Eyes: Negative.   Respiratory: Negative.   Cardiovascular: Positive for leg swelling.  Gastrointestinal: Negative.   Endocrine: Negative.   Genitourinary: Negative.   Musculoskeletal: Positive for myalgias.  Skin: Negative.   Allergic/Immunologic: Negative.   Neurological: Negative.   Hematological: Negative.   Psychiatric/Behavioral: Negative.        Objective:   Physical Exam  Constitutional: He is oriented to person, place, and time. He appears well-developed and well-nourished. No distress.  Eyes: Pupils are equal, round, and reactive to light.  Cardiovascular: Normal rate and regular rhythm.   Pulmonary/Chest: Effort normal and breath sounds normal.  Musculoskeletal: He exhibits edema and tenderness.  Neurological: He is alert and oriented to person, place, and time.  Skin: Skin is warm and dry. He is not diaphoretic. No erythema.  Psychiatric: He has a normal mood and affect.          Assessment & Plan:  Pt with new onset lower extremity edema, with dry and flaky skin to toes.   1) Will order ted hose on in am off at hs for dependent edema.  2) Will start Lasix 40 mg po qd x 3 days. Will check bmp in am. Last bun/creat = 25/1.47, k = 4.1.  3) Aquaphor ointment to bilateral lower extremities qd for dry, flaky skin.

## 2013-06-23 ENCOUNTER — Other Ambulatory Visit: Payer: Self-pay | Admitting: Cardiovascular Disease

## 2013-09-19 ENCOUNTER — Inpatient Hospital Stay (HOSPITAL_COMMUNITY)
Admission: EM | Admit: 2013-09-19 | Discharge: 2013-09-26 | DRG: 064 | Disposition: A | Discharge status: Skilled Nursing Facility | Payer: Medicare Other | Attending: Family Medicine | Admitting: Family Medicine

## 2013-09-19 ENCOUNTER — Emergency Department (HOSPITAL_COMMUNITY): Payer: Medicare Other

## 2013-09-19 ENCOUNTER — Observation Stay (HOSPITAL_COMMUNITY): Payer: Medicare Other

## 2013-09-19 ENCOUNTER — Encounter (HOSPITAL_COMMUNITY): Payer: Self-pay | Admitting: Emergency Medicine

## 2013-09-19 DIAGNOSIS — I639 Cerebral infarction, unspecified: Secondary | ICD-10-CM

## 2013-09-19 DIAGNOSIS — I4892 Unspecified atrial flutter: Secondary | ICD-10-CM

## 2013-09-19 DIAGNOSIS — N289 Disorder of kidney and ureter, unspecified: Secondary | ICD-10-CM

## 2013-09-19 DIAGNOSIS — Z8546 Personal history of malignant neoplasm of prostate: Secondary | ICD-10-CM

## 2013-09-19 DIAGNOSIS — R600 Localized edema: Secondary | ICD-10-CM

## 2013-09-19 DIAGNOSIS — I350 Nonrheumatic aortic (valve) stenosis: Secondary | ICD-10-CM

## 2013-09-19 DIAGNOSIS — R0989 Other specified symptoms and signs involving the circulatory and respiratory systems: Secondary | ICD-10-CM

## 2013-09-19 DIAGNOSIS — R578 Other shock: Secondary | ICD-10-CM

## 2013-09-19 DIAGNOSIS — D696 Thrombocytopenia, unspecified: Secondary | ICD-10-CM

## 2013-09-19 DIAGNOSIS — Z23 Encounter for immunization: Secondary | ICD-10-CM

## 2013-09-19 DIAGNOSIS — I429 Cardiomyopathy, unspecified: Secondary | ICD-10-CM

## 2013-09-19 DIAGNOSIS — R1312 Dysphagia, oropharyngeal phase: Secondary | ICD-10-CM | POA: Diagnosis present

## 2013-09-19 DIAGNOSIS — I5021 Acute systolic (congestive) heart failure: Secondary | ICD-10-CM

## 2013-09-19 DIAGNOSIS — R591 Generalized enlarged lymph nodes: Secondary | ICD-10-CM

## 2013-09-19 DIAGNOSIS — I340 Nonrheumatic mitral (valve) insufficiency: Secondary | ICD-10-CM

## 2013-09-19 DIAGNOSIS — F1010 Alcohol abuse, uncomplicated: Secondary | ICD-10-CM | POA: Diagnosis present

## 2013-09-19 DIAGNOSIS — I129 Hypertensive chronic kidney disease with stage 1 through stage 4 chronic kidney disease, or unspecified chronic kidney disease: Secondary | ICD-10-CM | POA: Diagnosis present

## 2013-09-19 DIAGNOSIS — I10 Essential (primary) hypertension: Secondary | ICD-10-CM | POA: Diagnosis present

## 2013-09-19 DIAGNOSIS — I441 Atrioventricular block, second degree: Secondary | ICD-10-CM

## 2013-09-19 DIAGNOSIS — D649 Anemia, unspecified: Secondary | ICD-10-CM

## 2013-09-19 DIAGNOSIS — I6340 Cerebral infarction due to embolism of unspecified cerebral artery: Principal | ICD-10-CM | POA: Diagnosis present

## 2013-09-19 DIAGNOSIS — G9340 Encephalopathy, unspecified: Secondary | ICD-10-CM

## 2013-09-19 DIAGNOSIS — D62 Acute posthemorrhagic anemia: Secondary | ICD-10-CM

## 2013-09-19 DIAGNOSIS — E876 Hypokalemia: Secondary | ICD-10-CM | POA: Diagnosis present

## 2013-09-19 DIAGNOSIS — K8050 Calculus of bile duct without cholangitis or cholecystitis without obstruction: Secondary | ICD-10-CM

## 2013-09-19 DIAGNOSIS — K117 Disturbances of salivary secretion: Secondary | ICD-10-CM

## 2013-09-19 DIAGNOSIS — Q809 Congenital ichthyosis, unspecified: Secondary | ICD-10-CM

## 2013-09-19 DIAGNOSIS — I2510 Atherosclerotic heart disease of native coronary artery without angina pectoris: Secondary | ICD-10-CM

## 2013-09-19 DIAGNOSIS — I739 Peripheral vascular disease, unspecified: Secondary | ICD-10-CM

## 2013-09-19 DIAGNOSIS — J189 Pneumonia, unspecified organism: Secondary | ICD-10-CM

## 2013-09-19 DIAGNOSIS — J4489 Other specified chronic obstructive pulmonary disease: Secondary | ICD-10-CM | POA: Diagnosis present

## 2013-09-19 DIAGNOSIS — E875 Hyperkalemia: Secondary | ICD-10-CM

## 2013-09-19 DIAGNOSIS — Z7902 Long term (current) use of antithrombotics/antiplatelets: Secondary | ICD-10-CM

## 2013-09-19 DIAGNOSIS — I4891 Unspecified atrial fibrillation: Secondary | ICD-10-CM

## 2013-09-19 DIAGNOSIS — N183 Chronic kidney disease, stage 3 unspecified: Secondary | ICD-10-CM

## 2013-09-19 DIAGNOSIS — I453 Trifascicular block: Secondary | ICD-10-CM

## 2013-09-19 DIAGNOSIS — R29810 Facial weakness: Secondary | ICD-10-CM

## 2013-09-19 DIAGNOSIS — E871 Hypo-osmolality and hyponatremia: Secondary | ICD-10-CM

## 2013-09-19 DIAGNOSIS — J449 Chronic obstructive pulmonary disease, unspecified: Secondary | ICD-10-CM | POA: Diagnosis present

## 2013-09-19 DIAGNOSIS — C61 Malignant neoplasm of prostate: Secondary | ICD-10-CM

## 2013-09-19 DIAGNOSIS — Z720 Tobacco use: Secondary | ICD-10-CM

## 2013-09-19 DIAGNOSIS — I359 Nonrheumatic aortic valve disorder, unspecified: Secondary | ICD-10-CM | POA: Diagnosis present

## 2013-09-19 DIAGNOSIS — N19 Unspecified kidney failure: Secondary | ICD-10-CM

## 2013-09-19 DIAGNOSIS — K922 Gastrointestinal hemorrhage, unspecified: Secondary | ICD-10-CM

## 2013-09-19 DIAGNOSIS — I6350 Cerebral infarction due to unspecified occlusion or stenosis of unspecified cerebral artery: Secondary | ICD-10-CM

## 2013-09-19 DIAGNOSIS — E785 Hyperlipidemia, unspecified: Secondary | ICD-10-CM

## 2013-09-19 DIAGNOSIS — B351 Tinea unguium: Secondary | ICD-10-CM

## 2013-09-19 DIAGNOSIS — I459 Conduction disorder, unspecified: Secondary | ICD-10-CM

## 2013-09-19 DIAGNOSIS — J690 Pneumonitis due to inhalation of food and vomit: Secondary | ICD-10-CM | POA: Diagnosis present

## 2013-09-19 DIAGNOSIS — F17200 Nicotine dependence, unspecified, uncomplicated: Secondary | ICD-10-CM | POA: Diagnosis present

## 2013-09-19 DIAGNOSIS — Z8719 Personal history of other diseases of the digestive system: Secondary | ICD-10-CM

## 2013-09-19 HISTORY — DX: Cerebral infarction, unspecified: I63.9

## 2013-09-19 HISTORY — DX: Gastrointestinal hemorrhage, unspecified: K92.2

## 2013-09-19 HISTORY — DX: Pneumonia, unspecified organism: J18.9

## 2013-09-19 LAB — CBC
HCT: 43.4 % (ref 39.0–52.0)
HCT: 37.4 % — ABNORMAL LOW (ref 39.0–52.0)
Hemoglobin: 12.7 g/dL — ABNORMAL LOW (ref 13.0–17.0)
MCH: 30.3 pg (ref 26.0–34.0)
MCHC: 34 g/dL (ref 30.0–36.0)
MCV: 89.3 fL (ref 78.0–100.0)
Platelets: 116 10*3/uL — ABNORMAL LOW (ref 150–400)
Platelets: 99 10*3/uL — ABNORMAL LOW (ref 150–400)
RBC: 4.88 MIL/uL (ref 4.22–5.81)
RBC: 4.19 MIL/uL — ABNORMAL LOW (ref 4.22–5.81)
RDW: 13.6 % (ref 11.5–15.5)
RDW: 13.7 % (ref 11.5–15.5)
WBC: 7.2 10*3/uL (ref 4.0–10.5)
WBC: 7.8 10*3/uL (ref 4.0–10.5)

## 2013-09-19 LAB — COMPREHENSIVE METABOLIC PANEL
ALT: 22 U/L (ref 0–53)
AST: 20 U/L (ref 0–37)
Albumin: 4.5 g/dL (ref 3.5–5.2)
BUN: 25 mg/dL — ABNORMAL HIGH (ref 6–23)
CO2: 22 mEq/L (ref 19–32)
Chloride: 98 mEq/L (ref 96–112)
GFR calc non Af Amer: 40 mL/min — ABNORMAL LOW (ref >90)
Potassium: 4.1 mEq/L (ref 3.5–5.1)
Sodium: 138 mEq/L (ref 135–145)
Total Bilirubin: 1.2 mg/dL (ref 0.3–1.2)
Total Protein: 7.7 g/dL (ref 6.0–8.3)

## 2013-09-19 LAB — GLUCOSE, CAPILLARY: Glucose-Capillary: 80 mg/dL (ref 70–99)

## 2013-09-19 LAB — RAPID URINE DRUG SCREEN, HOSP PERFORMED
Amphetamines: NOT DETECTED
Barbiturates: NOT DETECTED
Benzodiazepines: NOT DETECTED
Cocaine: NOT DETECTED
Opiates: NOT DETECTED
Tetrahydrocannabinol: NOT DETECTED

## 2013-09-19 LAB — DIFFERENTIAL
Basophils Absolute: 0.1 10*3/uL (ref 0.0–0.1)
Basophils Relative: 1 % (ref 0–1)
Lymphocytes Relative: 25 % (ref 12–46)
Neutro Abs: 4.7 10*3/uL (ref 1.7–7.7)
Neutrophils Relative %: 65 % (ref 43–77)

## 2013-09-19 LAB — APTT: aPTT: 27 seconds (ref 24–37)

## 2013-09-19 LAB — URINALYSIS W MICROSCOPIC + REFLEX CULTURE
Hgb urine dipstick: NEGATIVE
Ketones, ur: 15 mg/dL — AB
Protein, ur: 30 mg/dL — AB
Specific Gravity, Urine: 1.016 (ref 1.005–1.030)
Urobilinogen, UA: 1 mg/dL (ref 0.0–1.0)

## 2013-09-19 LAB — POCT I-STAT, CHEM 8
BUN: 27 mg/dL — ABNORMAL HIGH (ref 6–23)
Calcium, Ion: 1.24 mmol/L (ref 1.13–1.30)
Chloride: 102 mEq/L (ref 96–112)
Creatinine, Ser: 2 mg/dL — ABNORMAL HIGH (ref 0.50–1.35)
Glucose, Bld: 88 mg/dL (ref 70–99)
HCT: 47 % (ref 39.0–52.0)
Potassium: 3.9 mEq/L (ref 3.5–5.1)
TCO2: 23 mmol/L (ref 0–100)

## 2013-09-19 LAB — CREATININE, SERUM
Creatinine, Ser: 1.64 mg/dL — ABNORMAL HIGH (ref 0.50–1.35)
GFR calc Af Amer: 46 mL/min — ABNORMAL LOW (ref >90)
GFR calc non Af Amer: 39 mL/min — ABNORMAL LOW (ref >90)

## 2013-09-19 LAB — PROTIME-INR
INR: 1.01 (ref 0.00–1.49)
Prothrombin Time: 13.1 seconds (ref 11.6–15.2)

## 2013-09-19 LAB — ETHANOL: Alcohol, Ethyl (B): <11 mg/dL (ref 0–11)

## 2013-09-19 LAB — TROPONIN I: Troponin I: <0.3 ng/mL (ref <0.30)

## 2013-09-19 MED ORDER — BICALUTAMIDE 50 MG PO TABS
50.0000 mg | ORAL_TABLET | Freq: Every day | ORAL | Status: DC
  Administered 2013-09-21 – 2013-09-26 (×5): 50 mg via ORAL
  Filled 2013-09-19 (×8): qty 1

## 2013-09-19 MED ORDER — ATORVASTATIN CALCIUM 40 MG PO TABS
40.0000 mg | ORAL_TABLET | Freq: Every day | ORAL | Status: DC
  Administered 2013-09-21 – 2013-09-26 (×5): 40 mg via ORAL
  Filled 2013-09-19 (×8): qty 1

## 2013-09-19 MED ORDER — CEFTRIAXONE SODIUM 1 G IJ SOLR
1.0000 g | Freq: Once | INTRAMUSCULAR | Status: AC
  Administered 2013-09-19: 1 g via INTRAVENOUS
  Filled 2013-09-19: qty 10

## 2013-09-19 MED ORDER — SODIUM CHLORIDE 0.9 % IV SOLN
INTRAVENOUS | Status: DC
  Administered 2013-09-19 (×2): via INTRAVENOUS

## 2013-09-19 MED ORDER — DEXTROSE 5 % IV SOLN
1.0000 g | INTRAVENOUS | Status: DC
  Administered 2013-09-19 – 2013-09-23 (×4): 1 g via INTRAVENOUS
  Filled 2013-09-19 (×4): qty 10

## 2013-09-19 MED ORDER — PANTOPRAZOLE SODIUM 40 MG PO TBEC
40.0000 mg | DELAYED_RELEASE_TABLET | Freq: Every day | ORAL | Status: DC
  Administered 2013-09-21 – 2013-09-26 (×5): 40 mg via ORAL
  Filled 2013-09-19 (×2): qty 1

## 2013-09-19 MED ORDER — DOXYCYCLINE HYCLATE 100 MG PO TABS: 100.0000 mg | ORAL_TABLET | Freq: Once | ORAL | Status: DC

## 2013-09-19 MED ORDER — INFLUENZA VAC SPLIT QUAD 0.5 ML IM SUSP
0.5000 mL | INTRAMUSCULAR | Status: AC
  Filled 2013-09-19: qty 0.5

## 2013-09-19 MED ORDER — SODIUM CHLORIDE 0.9 % IV SOLN: INTRAVENOUS | Status: DC

## 2013-09-19 MED ORDER — ENOXAPARIN SODIUM 40 MG/0.4ML ~~LOC~~ SOLN
40.0000 mg | SUBCUTANEOUS | Status: DC
  Administered 2013-09-19 – 2013-09-25 (×5): 40 mg via SUBCUTANEOUS
  Filled 2013-09-19 (×8): qty 0.4

## 2013-09-19 MED ORDER — CLOPIDOGREL BISULFATE 75 MG PO TABS
75.0000 mg | ORAL_TABLET | Freq: Every day | ORAL | Status: DC
  Administered 2013-09-20 – 2013-09-26 (×5): 75 mg via ORAL
  Filled 2013-09-19 (×7): qty 1

## 2013-09-19 MED ORDER — SENNOSIDES-DOCUSATE SODIUM 8.6-50 MG PO TABS: 1.0000 | ORAL_TABLET | Freq: Every evening | ORAL | Status: DC | PRN

## 2013-09-19 MED ORDER — DOXYCYCLINE HYCLATE 100 MG PO TABS
100.0000 mg | ORAL_TABLET | Freq: Two times a day (BID) | ORAL | Status: DC
  Administered 2013-09-20 – 2013-09-23 (×5): 100 mg via ORAL
  Filled 2013-09-19 (×9): qty 1

## 2013-09-19 NOTE — H&P (Signed)
PCP:   ANDY,CAMILLE L, MD   Chief Complaint:  Slurred speech  HPI: 77 y/o male with h/o hypertension, COPD, aortic stenosis, who is a poor historian due to slurred speech, says that he started noticing slurred speech three days ago, which was associated with left side facial droop.he denies weakness of extremity, no seizure, no passing out, denies blurred vision.he denies chest pain, no shortness of breath. Patient says he saw her PCP today for slurred speech and she called EMS for transfer to the ED.In the ED CT head was negative but MRI showed right parietal infarct, Neurology has been consulted.  Allergies:  No Known Allergies    Past Medical History  Diagnosis Date  . Hypertension   . Edema of leg   . COPD (chronic obstructive pulmonary disease)   . Choledocholithiasis   . Coronary arteriosclerosis   . Hyperlipidemia   . Ichthyosis   . Mitral regurgitation   . Onychomycosis   . Vitamin D deficiency   . Aortic stenosis   . Hyperkalemia   . Hypocalcemia   . Lymphadenopathy   . Prostate cancer   . Status post lung surgery 12/07/2001    BENIGHN LESION ON RIGHT LUNG REMOVED  . Ascites     HISTORY  . History of colonic diverticulitis   . Olecranon bursitis     Past Surgical History  Procedure Laterality Date  . Hemorrhoid surgery  12/07/1998  . Lung surgery  12/07/2001    BENIGN LESION ON RIGHT LUNG REMOVED  . Colonoscopy    . Femur im nail Left 01/21/2013    Procedure: INTRAMEDULLARY (IM) NAIL FEMORAL;  Surgeon: Velna Ochs, MD;  Location: [facility];  Service: Orthopedics;  Laterality: Left;  . Esophagogastroduodenoscopy N/A 03/16/2013    Procedure: ESOPHAGOGASTRODUODENOSCOPY (EGD);  Surgeon: Florencia Reasons, MD;  Location: [facility] ENDOSCOPY;  Service: Endoscopy;  Laterality: N/A;  will probably want to do at bedside in ICU---bed placement pending    Prior to Admission medications   Medication Sig Start Date End Date Taking? Authorizing Provider  atorvastatin (LIPITOR) 40 MG  tablet Take 40 mg by mouth daily.   Yes Historical Provider, MD  bicalutamide (CASODEX) 50 MG tablet Take 50 mg by mouth daily.   Yes Historical Provider, MD  furosemide (LASIX) 20 MG tablet Take 20 mg by mouth daily as needed.   Yes Historical Provider, MD  metoprolol succinate (TOPROL-XL) 100 MG 24 hr tablet Take 100 mg by mouth daily. Take with or immediately following a meal.   Yes Historical Provider, MD  Multiple Vitamin (MULTIVITAMIN WITH MINERALS) TABS Take 1 tablet by mouth daily.   Yes Historical Provider, MD  nicotine (NICODERM CQ - DOSED IN MG/24 HOURS) 14 mg/24hr patch Place 1 patch onto the skin daily.   Yes Historical Provider, MD  olmesartan (BENICAR) 40 MG tablet Take 40 mg by mouth daily.   Yes Historical Provider, MD  omega-3 acid ethyl esters (LOVAZA) 1 G capsule Take 2 g by mouth 2 (two) times daily.   Yes Historical Provider, MD  pantoprazole (PROTONIX) 40 MG tablet Take 40 mg by mouth daily.   Yes Historical Provider, MD  thiamine 100 MG tablet Take 100 mg by mouth daily.   Yes Historical Provider, MD  furosemide (LASIX) 40 MG tablet Take 1 tablet (40 mg total) by mouth daily. 03/27/13 03/29/13  Lucrezia Starch, NP    Social History:  reports that he has been smoking Cigarettes.  He has been smoking about 1.50 packs  per day. He does not have any smokeless tobacco history on file. He reports that he drinks alcohol. He reports that he does not use illicit drugs.  Family History  Problem Relation Age of Onset  . Hypertension Mother 70  . Hypertension Father 46  . Hypertension Sister   . Hypertension Brother   . Hypertension Sister   . Hypertension Brother   . Hypertension Brother   . Hypertension Brother   . Hypertension Brother      All the positives are listed in BOLD  Review of Systems:  HEENT: Headache, blurred vision, runny nose, sore throat Neck: Hypothyroidism, hyperthyroidism,,lymphadenopathy Chest : Shortness of breath, history of COPD, Asthma Heart : Chest  pain, history of coronary arterey disease GI:  Nausea, vomiting, diarrhea, constipation, GERD GU: Dysuria, urgency, frequency of urination, hematuria Neuro: Stroke, seizures, syncope Psych: Depression, anxiety, hallucinations   Physical Exam: Blood pressure 177/93, pulse 61, temperature 97.4 F (36.3 C), temperature source Axillary, resp. rate 18, height 6' (1.829 m), weight 83.598 kg (184 lb 4.8 oz), SpO2 98.00%. Constitutional:   Patient is a well-developed and well-nourished male* in no acute distress and cooperative with exam. Head: Normocephalic and atraumatic Mouth: Mucus membranes moist Eyes: PERRL, EOMI, conjunctivae normal Neck: Supple, No Thyromegaly Cardiovascular: RRR, S1 normal, S2 normal Pulmonary/Chest: CTAB, no wheezes, rales, or rhonchi Abdominal: Soft. Non-tender, non-distended, bowel sounds are normal, no masses, organomegaly, or guarding present.  Neurological: A&O x3, Strenght is normal and symmetric bilaterally, cranial nerve II-XII are grossly intact, no focal motor deficit, sensory intact to light touch bilaterally.  Extremities : No Cyanosis, Clubbing or Edema Neuro: CN 2-12 are grossly intact except left side facial droop   Labs on Admission:  Results for orders placed during the hospital encounter of 09/19/13 (from the past 48 hour(s))  GLUCOSE, CAPILLARY     Status: None   Collection Time    09/19/13 12:05 PM      Result Value Range   Glucose-Capillary 80  70 - 99 mg/dL   Comment 1 Notify RN     Comment 2 Documented in Chart    ETHANOL     Status: None   Collection Time    09/19/13 12:30 PM      Result Value Range   Alcohol, Ethyl (B) <11  0 - 11 mg/dL   Comment:            LOWEST DETECTABLE LIMIT FOR     SERUM ALCOHOL IS 11 mg/dL     FOR MEDICAL PURPOSES ONLY  PROTIME-INR     Status: None   Collection Time    09/19/13 12:30 PM      Result Value Range   Prothrombin Time 13.1  11.6 - 15.2 seconds   INR 1.01  0.00 - 1.49  APTT     Status: None    Collection Time    09/19/13 12:30 PM      Result Value Range   aPTT 27  24 - 37 seconds  CBC     Status: Abnormal   Collection Time    09/19/13 12:30 PM      Result Value Range   WBC 7.2  4.0 - 10.5 K/uL   RBC 4.88  4.22 - 5.81 MIL/uL   Hemoglobin 14.7  13.0 - 17.0 g/dL   HCT 16.1  09.6 - 04.5 %   MCV 88.9  78.0 - 100.0 fL   MCH 30.1  26.0 - 34.0 pg   MCHC 33.9  30.0 - 36.0 g/dL   RDW 16.1  09.6 - 04.5 %   Platelets 116 (*) 150 - 400 K/uL   Comment: PLATELET COUNT CONFIRMED BY SMEAR  DIFFERENTIAL     Status: None   Collection Time    09/19/13 12:30 PM      Result Value Range   Neutrophils Relative % 65  43 - 77 %   Neutro Abs 4.7  1.7 - 7.7 K/uL   Lymphocytes Relative 25  12 - 46 %   Lymphs Abs 1.8  0.7 - 4.0 K/uL   Monocytes Relative 7  3 - 12 %   Monocytes Absolute 0.5  0.1 - 1.0 K/uL   Eosinophils Relative 2  0 - 5 %   Eosinophils Absolute 0.1  0.0 - 0.7 K/uL   Basophils Relative 1  0 - 1 %   Basophils Absolute 0.1  0.0 - 0.1 K/uL  COMPREHENSIVE METABOLIC PANEL     Status: Abnormal   Collection Time    09/19/13 12:30 PM      Result Value Range   Sodium 138  135 - 145 mEq/L   Potassium 4.1  3.5 - 5.1 mEq/L   Chloride 98  96 - 112 mEq/L   CO2 22  19 - 32 mEq/L   Glucose, Bld 88  70 - 99 mg/dL   BUN 25 (*) 6 - 23 mg/dL   Creatinine, Ser 4.09 (*) 0.50 - 1.35 mg/dL   Calcium 81.1  8.4 - 91.4 mg/dL   Total Protein 7.7  6.0 - 8.3 g/dL   Albumin 4.5  3.5 - 5.2 g/dL   AST 20  0 - 37 U/L   ALT 22  0 - 53 U/L   Alkaline Phosphatase 95  39 - 117 U/L   Total Bilirubin 1.2  0.3 - 1.2 mg/dL   GFR calc non Af Amer 40 (*) >90 mL/min   GFR calc Af Amer 46 (*) >90 mL/min   Comment: (NOTE)     The eGFR has been calculated using the CKD EPI equation.     This calculation has not been validated in all clinical situations.     eGFR's persistently <90 mL/min signify possible Chronic Kidney     Disease.  TROPONIN I     Status: None   Collection Time    09/19/13 12:30 PM       Result Value Range   Troponin I <0.30  <0.30 ng/mL   Comment:            Due to the release kinetics of cTnI,     a negative result within the first hours     of the onset of symptoms does not rule out     myocardial infarction with certainty.     If myocardial infarction is still suspected,     repeat the test at appropriate intervals.  LACTIC ACID, PLASMA     Status: None   Collection Time    09/19/13 12:30 PM      Result Value Range   Lactic Acid, Venous 1.9  0.5 - 2.2 mmol/L  POCT I-STAT, CHEM 8     Status: Abnormal   Collection Time    09/19/13 12:30 PM      Result Value Range   Sodium 140  135 - 145 mEq/L   Potassium 3.9  3.5 - 5.1 mEq/L   Chloride 102  96 - 112 mEq/L   BUN 27 (*) 6 - 23 mg/dL  Creatinine, Ser 2.00 (*) 0.50 - 1.35 mg/dL   Glucose, Bld 88  70 - 99 mg/dL   Calcium, Ion 7.84  6.96 - 1.30 mmol/L   TCO2 23  0 - 100 mmol/L   Hemoglobin 16.0  13.0 - 17.0 g/dL   HCT 29.5  28.4 - 13.2 %  URINE RAPID DRUG SCREEN (HOSP PERFORMED)     Status: None   Collection Time    09/19/13  1:31 PM      Result Value Range   Opiates NONE DETECTED  NONE DETECTED   Cocaine NONE DETECTED  NONE DETECTED   Benzodiazepines NONE DETECTED  NONE DETECTED   Amphetamines NONE DETECTED  NONE DETECTED   Tetrahydrocannabinol NONE DETECTED  NONE DETECTED   Barbiturates NONE DETECTED  NONE DETECTED   Comment:            DRUG SCREEN FOR MEDICAL PURPOSES     ONLY.  IF CONFIRMATION IS NEEDED     FOR ANY PURPOSE, NOTIFY LAB     WITHIN 5 DAYS.                LOWEST DETECTABLE LIMITS     FOR URINE DRUG SCREEN     Drug Class       Cutoff (ng/mL)     Amphetamine      1000     Barbiturate      200     Benzodiazepine   200     Tricyclics       300     Opiates          300     Cocaine          300     THC              50  URINALYSIS W MICROSCOPIC + REFLEX CULTURE     Status: Abnormal   Collection Time    09/19/13  1:31 PM      Result Value Range   Color, Urine YELLOW  YELLOW    APPearance CLEAR  CLEAR   Specific Gravity, Urine 1.016  1.005 - 1.030   pH 5.0  5.0 - 8.0   Glucose, UA NEGATIVE  NEGATIVE mg/dL   Hgb urine dipstick NEGATIVE  NEGATIVE   Bilirubin Urine SMALL (*) NEGATIVE   Ketones, ur 15 (*) NEGATIVE mg/dL   Protein, ur 30 (*) NEGATIVE mg/dL   Urobilinogen, UA 1.0  0.0 - 1.0 mg/dL   Nitrite NEGATIVE  NEGATIVE   Leukocytes, UA NEGATIVE  NEGATIVE   Bacteria, UA RARE  RARE   Squamous Epithelial / LPF RARE  RARE    Radiological Exams on Admission: Dg Chest 2 View  09/19/2013   CLINICAL DATA:  Chest pain and congestion  EXAM: CHEST  2 VIEW  COMPARISON:  03/16/2013  FINDINGS: Cardiac shadow remains enlarged. Patchy infiltrate is noted throughout the mid and lower right lung no sizable effusion is noted. No acute bony abnormality is seen.  IMPRESSION: Patchy right lung infiltrate.   Electronically Signed   By: Alcide Clever M.D.   On: 09/19/2013 12:38   Ct Head Wo Contrast  09/19/2013   CLINICAL DATA:  Left-sided facial droop. Slurred speech. Hypertension. Hyperlipidemia.  EXAM: CT HEAD WITHOUT CONTRAST  TECHNIQUE: Contiguous axial images were obtained from the base of the skull through the vertex without intravenous contrast.  COMPARISON:  01/20/2013.  FINDINGS: No intracranial hemorrhage.  Prominent small vessel disease type changes without CT evidence of  large acute infarct. Streak artifacts slightly limits evaluation of the posterior fossa structures.  Global atrophy without hydrocephalus.  No intracranial mass lesion noted on this unenhanced exam.  Vascular calcifications.  Dental disease.  IMPRESSION: No intracranial hemorrhage.  Prominent small vessel disease type changes without CT evidence of large acute infarct. Streak artifacts slightly limits evaluation of the posterior fossa structures.  Global atrophy without hydrocephalus.   Electronically Signed   By: Bridgett Larsson M.D.   On: 09/19/2013 12:54   Mr Maxine Glenn Head Wo Contrast  09/19/2013   CLINICAL DATA:   Left facial droop. Slurred speech. Stroke.  EXAM: MRI HEAD WITHOUT CONTRAST  MRA HEAD WITHOUT CONTRAST  TECHNIQUE: Multiplanar, multiecho pulse sequences of the brain and surrounding structures were obtained without intravenous contrast. Angiographic images of the head were obtained using MRA technique without contrast.  COMPARISON:  CT 09/19/2013. MRI 04/29/2013  FINDINGS: MRI HEAD FINDINGS  5 mm acute infarct right occipital lobe. Acute infarct right frontal parietal cortex and subcortical white matter. No other areas of acute infarction.  Generalized atrophy. Chronic microvascular ischemic change throughout the white matter bilaterally.  Negative for hemorrhage or mass. No midline shift. Paranasal sinuses are clear.  MRA HEAD FINDINGS  Both vertebral arteries are patent to the basilar. Right PICA is patent. Left PICA not visualized. Left AICA is patent. Right AICA not visualized. Superior cerebellar and posterior cerebral arteries are patent bilaterally without significant stenosis.  Mild atherosclerotic disease in the left cavernous carotid which is patent without significant stenosis. Left anterior and middle cerebral arteries are patent.  Right cavernous carotid is widely patent. Right anterior and middle cerebral arteries are widely patent.  Negative for cerebral aneurysm.  IMPRESSION: MRI HEAD IMPRESSION  5 mm acute infarct right occipital lobe. Moderately large acute infarct right frontal parietal cortex and white matter.  Generalized atrophy with chronic microvascular ischemic change.  MRA HEAD IMPRESSION  No significant intracranial stenosis.   Electronically Signed   By: Marlan Palau M.D.   On: 09/19/2013 15:49   Mr Brain Wo Contrast  09/19/2013   CLINICAL DATA:  Left facial droop. Slurred speech. Stroke.  EXAM: MRI HEAD WITHOUT CONTRAST  MRA HEAD WITHOUT CONTRAST  TECHNIQUE: Multiplanar, multiecho pulse sequences of the brain and surrounding structures were obtained without intravenous contrast.  Angiographic images of the head were obtained using MRA technique without contrast.  COMPARISON:  CT 09/19/2013. MRI 04/29/2013  FINDINGS: MRI HEAD FINDINGS  5 mm acute infarct right occipital lobe. Acute infarct right frontal parietal cortex and subcortical white matter. No other areas of acute infarction.  Generalized atrophy. Chronic microvascular ischemic change throughout the white matter bilaterally.  Negative for hemorrhage or mass. No midline shift. Paranasal sinuses are clear.  MRA HEAD FINDINGS  Both vertebral arteries are patent to the basilar. Right PICA is patent. Left PICA not visualized. Left AICA is patent. Right AICA not visualized. Superior cerebellar and posterior cerebral arteries are patent bilaterally without significant stenosis.  Mild atherosclerotic disease in the left cavernous carotid which is patent without significant stenosis. Left anterior and middle cerebral arteries are patent.  Right cavernous carotid is widely patent. Right anterior and middle cerebral arteries are widely patent.  Negative for cerebral aneurysm.  IMPRESSION: MRI HEAD IMPRESSION  5 mm acute infarct right occipital lobe. Moderately large acute infarct right frontal parietal cortex and white matter.  Generalized atrophy with chronic microvascular ischemic change.  MRA HEAD IMPRESSION  No significant intracranial stenosis.   Electronically Signed  By: Marlan Palau M.D.   On: 09/19/2013 15:49    Assessment/Plan Active Problems:   Hypertension   CVA (cerebral infarction)   CAP (community acquired pneumonia)   CKD (chronic kidney disease), stage III  Patient will be admitted under telemetry, will hold the antihypertensives for permissive hypertension, will start plavix as per neuro recommendation. Will obtain 2 d echo, carotid dopplers, fasting lipid panel and Hb a1c. Patient was found to have patchy right lung infiltrate on CXR, will start him on Rocephin and Doxycycline as he has prolonged QT interval so  zithromax is avoided at this time. Neurology to follow in am. Code status:  Family discussion: No family at bedside, discussed with patient in detail  Code status- Full code  Time Spent on Admission: 75 min  Yoshimi Sarr S [facility] Pager: [phone] 09/19/2013, 5:32 PM  If 7PM-7AM, please contact night-coverage  [email or web address]  Password [email or web address]

## 2013-09-19 NOTE — ED Notes (Addendum)
Pt reports to the ED for eval of left sided facial droop, drooling, slurred speech, and generalized weakness worse on the left side. Pt also complaining of chest pain and heaviness. Associated symptoms include SOB. Pt was recently admitted for PNA. Denies productive cough. Per EMS pt also having difficulty walking but unsure if this is baseline or not as pt presents with a cane. Per EMS pt in atrial fibrillation at a rate of 50s-60s. At this time pt in NSR. Pt A&O x4.

## 2013-09-19 NOTE — ED Provider Notes (Signed)
CSN: [id number]     Arrival date & time 09/19/13  1128 History   First MD Initiated Contact with Patient 09/19/13 1144     Chief Complaint  Patient presents with  . Chest Pain  . Facial Droop    HPI Pt was seen at 1155. Per pt, c/o gradual onset and persistence of constant multiple symptoms for the past 2 to 3 days. Pt's symptoms include: moist cough, SOB, chest "heaviness," left sided facial droop, "drooling" from the left side of his mouth, and generalized weakness. Denies fevers, no rash, no palpitations, no back pain, no abd pain, no N/V/D, no focal motor weakness from baseline, no tingling/numbness in extremities.     Past Medical History  Diagnosis Date  . Hypertension   . Edema of leg   . COPD (chronic obstructive pulmonary disease)   . Choledocholithiasis   . Coronary arteriosclerosis   . Hyperlipidemia   . Ichthyosis   . Mitral regurgitation   . Onychomycosis   . Vitamin D deficiency   . Aortic stenosis   . Hyperkalemia   . Hypocalcemia   . Lymphadenopathy   . Prostate cancer   . Status post lung surgery 12/07/2001    BENIGHN LESION ON RIGHT LUNG REMOVED  . Ascites     HISTORY  . History of colonic diverticulitis   . Olecranon bursitis    Past Surgical History  Procedure Laterality Date  . Hemorrhoid surgery  12/07/1998  . Lung surgery  12/07/2001    BENIGN LESION ON RIGHT LUNG REMOVED  . Colonoscopy    . Femur im nail Left 01/21/2013    Procedure: INTRAMEDULLARY (IM) NAIL FEMORAL;  Surgeon: Velna Ochs, MD;  Location: [facility];  Service: Orthopedics;  Laterality: Left;  . Esophagogastroduodenoscopy N/A 03/16/2013    Procedure: ESOPHAGOGASTRODUODENOSCOPY (EGD);  Surgeon: Florencia Reasons, MD;  Location: [facility] ENDOSCOPY;  Service: Endoscopy;  Laterality: N/A;  will probably want to do at bedside in ICU---bed placement pending   Family History  Problem Relation Age of Onset  . Hypertension Mother 22  . Hypertension Father 65  . Hypertension Sister   . Hypertension  Brother   . Hypertension Sister   . Hypertension Brother   . Hypertension Brother   . Hypertension Brother   . Hypertension Brother    History  Substance Use Topics  . Smoking status: Current Some Day Smoker -- 1.50 packs/day    Types: Cigarettes  . Smokeless tobacco: Not on file  . Alcohol Use: Yes     Comment: occasionally    Review of Systems ROS: Statement: All systems negative except as marked or noted in the HPI; Constitutional: Negative for fever and chills. +generalized weakness. ; ; Eyes: Negative for eye pain, redness and discharge. ; ; ENMT: Negative for ear pain, hoarseness, nasal congestion, sinus pressure and sore throat. ; ; Cardiovascular: +CP. Negative for palpitations, diaphoresis, and peripheral edema. ; ; Respiratory: +cough, SOB. Negative for wheezing and stridor. ; ; Gastrointestinal: Negative for nausea, vomiting, diarrhea, abdominal pain, blood in stool, hematemesis, jaundice and rectal bleeding. . ; ; Genitourinary: Negative for dysuria, flank pain and hematuria. ; ; Musculoskeletal: Negative for back pain and neck pain. Negative for swelling and trauma.; ; Skin: Negative for pruritus, rash, abrasions, blisters, bruising and skin lesion.; ; Neuro: +facial droop. Negative for headache, lightheadedness and neck stiffness. Negative for altered level of consciousness , altered mental status, extremity weakness, paresthesias, involuntary movement, seizure and syncope.      Allergies  Review of patient's allergies indicates no known allergies.  Home Medications   Current Outpatient Rx  Name  Route  Sig  Dispense  Refill  . bicalutamide (CASODEX) 50 MG tablet   Oral   Take 50 mg by mouth daily.         . clopidogrel (PLAVIX) 75 MG tablet   Oral   Take 1 tablet (75 mg total) by mouth daily.   30 tablet   0   . docusate sodium (COLACE) 100 MG capsule   Oral   Take 100 mg by mouth 2 (two) times daily.         Marland Kitchen EXPIRED: furosemide (LASIX) 40 MG tablet    Oral   Take 1 tablet (40 mg total) by mouth daily.   3 tablet   0   . HYDROcodone-acetaminophen (NORCO/VICODIN) 5-325 MG per tablet   Oral   Take 1-2 tablets by mouth every 6 (six) hours as needed for pain.         Marland Kitchen LORazepam (ATIVAN) 2 MG tablet   Oral   Take 1 mg by mouth every 8 (eight) hours as needed for anxiety.         Marland Kitchen menthol-cetylpyridinium (CEPACOL) 3 MG lozenge   Oral   Take 1 lozenge by mouth as needed (For sore throat.).         Marland Kitchen metoprolol succinate (TOPROL-XL) 25 MG 24 hr tablet   Oral   Take 1 tablet (25 mg total) by mouth daily.   30 tablet   0   . mineral oil-hydrophilic petrolatum (AQUAPHOR) ointment   Topical   Apply topically daily.   420 g   0   . Multiple Vitamin (MULTIVITAMIN WITH MINERALS) TABS   Oral   Take 1 tablet by mouth daily.         . nicotine (NICODERM CQ - DOSED IN MG/24 HOURS) 14 mg/24hr patch   Transdermal   Place 1 patch onto the skin daily.         Marland Kitchen olmesartan (BENICAR) 20 MG tablet   Oral   Take 1 tablet (20 mg total) by mouth daily.   30 tablet   0   . omega-3 acid ethyl esters (LOVAZA) 1 G capsule   Oral   Take 2 g by mouth 2 (two) times daily.         Marland Kitchen thiamine 100 MG tablet   Oral   Take 100 mg by mouth daily.          BP 165/99  Pulse 65  Temp(Src) 97.5 F (36.4 C) (Oral)  Resp 33  SpO2 95% Physical Exam 1200: Physical examination:  Nursing notes reviewed; Vital signs and O2 SAT reviewed;  Constitutional: Well developed, Well nourished, In no acute distress; Head:  Normocephalic, atraumatic; Eyes: EOMI, PERRL, No scleral icterus; ENMT: Mouth and pharynx normal, Mucous membranes dry; Neck: Supple, Full range of motion, No lymphadenopathy; Cardiovascular: Irregular rate and rhythm, No gallop; Respiratory: Breath sounds coarse & equal bilaterally, No wheezes. +moist cough with thick sputum expectorated during exam. Speaking full sentences with ease, Normal respiratory effort/excursion; Chest:  Nontender, Movement normal; Abdomen: Soft, Nontender, Nondistended, Normal bowel sounds; Genitourinary: No CVA tenderness; Extremities: Pulses normal, No tenderness, No edema, No calf edema or asymmetry.; Neuro: AA&Ox3, Major CN grossly intact. +left sided facial droop, does not include left forehead. Speech slurred. Equal grips. Strength 5/5 equal bilat UE's and RLE; strength 3/5 LLE (per baseline per pt "since I broke my leg  in February"). No gross sensory deficits in extremities.; Skin: Color normal, Warm, Dry.   ED Course  Procedures    EKG Interpretation     Ventricular Rate:    PR Interval:    QRS Duration:   QT Interval:    QTC Calculation:   R Axis:     Text Interpretation:    See below          MDM  MDM Reviewed: previous chart, nursing note and vitals Reviewed previous: labs and ECG Interpretation: labs, ECG, x-ray and CT scan    Date: 09/19/2013  Rate: 65  Rhythm: normal sinus rhythm, baseline wander  QRS Axis: left  Intervals: PR prolonged and QT prolonged  ST/T Wave abnormalities: nonspecific ST/T changes  Conduction Disutrbances:first-degree A-V block , right bundle branch block and left anterior fascicular block  Narrative Interpretation:   Old EKG Reviewed: changes noted, previous EKG dated 03/16/13 was afib.  Results for orders placed during the hospital encounter of 09/19/13  ETHANOL      Result Value Range   Alcohol, Ethyl (B) <11  0 - 11 mg/dL  PROTIME-INR      Result Value Range   Prothrombin Time 13.1  11.6 - 15.2 seconds   INR 1.01  0.00 - 1.49  APTT      Result Value Range   aPTT 27  24 - 37 seconds  CBC      Result Value Range   WBC 7.2  4.0 - 10.5 K/uL   RBC 4.88  4.22 - 5.81 MIL/uL   Hemoglobin 14.7  13.0 - 17.0 g/dL   HCT 46.9  62.9 - 52.8 %   MCV 88.9  78.0 - 100.0 fL   MCH 30.1  26.0 - 34.0 pg   MCHC 33.9  30.0 - 36.0 g/dL   RDW 41.3  24.4 - 01.0 %   Platelets 116 (*) 150 - 400 K/uL  DIFFERENTIAL      Result Value Range    Neutrophils Relative % 65  43 - 77 %   Neutro Abs 4.7  1.7 - 7.7 K/uL   Lymphocytes Relative 25  12 - 46 %   Lymphs Abs 1.8  0.7 - 4.0 K/uL   Monocytes Relative 7  3 - 12 %   Monocytes Absolute 0.5  0.1 - 1.0 K/uL   Eosinophils Relative 2  0 - 5 %   Eosinophils Absolute 0.1  0.0 - 0.7 K/uL   Basophils Relative 1  0 - 1 %   Basophils Absolute 0.1  0.0 - 0.1 K/uL  COMPREHENSIVE METABOLIC PANEL      Result Value Range   Sodium 138  135 - 145 mEq/L   Potassium 4.1  3.5 - 5.1 mEq/L   Chloride 98  96 - 112 mEq/L   CO2 22  19 - 32 mEq/L   Glucose, Bld 88  70 - 99 mg/dL   BUN 25 (*) 6 - 23 mg/dL   Creatinine, Ser 2.72 (*) 0.50 - 1.35 mg/dL   Calcium 53.6  8.4 - 64.4 mg/dL   Total Protein 7.7  6.0 - 8.3 g/dL   Albumin 4.5  3.5 - 5.2 g/dL   AST 20  0 - 37 U/L   ALT 22  0 - 53 U/L   Alkaline Phosphatase 95  39 - 117 U/L   Total Bilirubin 1.2  0.3 - 1.2 mg/dL   GFR calc non Af Amer 40 (*) >90 mL/min   GFR calc Af  Amer 46 (*) >90 mL/min  TROPONIN I      Result Value Range   Troponin I <0.30  <0.30 ng/mL  LACTIC ACID, PLASMA      Result Value Range   Lactic Acid, Venous 1.9  0.5 - 2.2 mmol/L  GLUCOSE, CAPILLARY      Result Value Range   Glucose-Capillary 80  70 - 99 mg/dL   Comment 1 Notify RN     Comment 2 Documented in Chart     Dg Chest 2 View 09/19/2013   CLINICAL DATA:  Chest pain and congestion  EXAM: CHEST  2 VIEW  COMPARISON:  03/16/2013  FINDINGS: Cardiac shadow remains enlarged. Patchy infiltrate is noted throughout the mid and lower right lung no sizable effusion is noted. No acute bony abnormality is seen.  IMPRESSION: Patchy right lung infiltrate.   Electronically Signed   By: Alcide Clever M.D.   On: 09/19/2013 12:38   Ct Head Wo Contrast 09/19/2013   CLINICAL DATA:  Left-sided facial droop. Slurred speech. Hypertension. Hyperlipidemia.  EXAM: CT HEAD WITHOUT CONTRAST  TECHNIQUE: Contiguous axial images were obtained from the base of the skull through the vertex without  intravenous contrast.  COMPARISON:  01/20/2013.  FINDINGS: No intracranial hemorrhage.  Prominent small vessel disease type changes without CT evidence of large acute infarct. Streak artifacts slightly limits evaluation of the posterior fossa structures.  Global atrophy without hydrocephalus.  No intracranial mass lesion noted on this unenhanced exam.  Vascular calcifications.  Dental disease.  IMPRESSION: No intracranial hemorrhage.  Prominent small vessel disease type changes without CT evidence of large acute infarct. Streak artifacts slightly limits evaluation of the posterior fossa structures.  Global atrophy without hydrocephalus.   Electronically Signed   By: Bridgett Larsson M.D.   On: 09/19/2013 12:54    Results for DAMARKUS, BALIS (MRN [id number]) as of 09/19/2013 14:05  Ref. Range 03/19/2013 05:00 03/19/2013 11:25 03/19/2013 20:33 03/20/2013 09:00 09/19/2013 12:30  Hemoglobin Latest Range: 13.0-17.0 g/dL 8.6 (L) 8.6 (L) 8.5 (L) 8.9 (L) 14.7  HCT Latest Range: 39.0-52.0 % 23.8 (L) 24.0 (L) 23.8 (L) 24.8 (L) 43.4  Platelets Latest Range: 150-400 K/uL 117 (L) 123 (L) 120 (L) 131 (L) 116 (L)    Results for KENNTH, VANBENSCHOTEN (MRN [id number]) as of 09/19/2013 14:05  Ref. Range 03/18/2013 20:45 03/19/2013 05:00 09/19/2013 12:30  BUN Latest Range: 6-23 mg/dL 62 (H) 53 (H) 25 (H)  Creatinine Latest Range: 0.50-1.35 mg/dL 6.96 (H) 2.95 (H) 2.84 (H)     1350:  Will tx for CAP with IV rocephin and doxycycline due to prolonged QT on EKG. No acute stroke on CT head; will obtain MRI brain. Pt failed swallow screen; will need further eval as inpatient; will continue IVF. T/C to [facility]. Sharl Ma, case discussed, including:  HPI, pertinent PM/SHx, VS/PE, dx testing, ED course and treatment:  Agreeable to admit, requests to write temporary orders, obtain neurotele bed to team [id number].   Laray Anger, DO 09/20/13 2139

## 2013-09-19 NOTE — Consult Note (Signed)
Referring Physician: [place]    Chief Complaint: Stroke  HPI:                                                                                                                                         Matthew Mahoney is an 77 y.o. male who was last seen normal on Saturday. He awoke on Sunday and noted he may have had a facial droop on the left. Later that day he was eating stew and noted he had a hard time keeping it in his mouth. He was brought to ED for further evaluation. MRI brain was obtained and is positive for right parietal infarct. There is question of possible Afib en route with EMS.  12 lead obtained in hospital showed NSR. Currently patient is hooked up to telemetry and showing NSR.   Date last known well: Date: 09/17/2013 Time last known well: Unable to determine tPA Given: No: out of window  Past Medical History  Diagnosis Date  . Hypertension   . Edema of leg   . COPD (chronic obstructive pulmonary disease)   . Choledocholithiasis   . Coronary arteriosclerosis   . Hyperlipidemia   . Ichthyosis   . Mitral regurgitation   . Onychomycosis   . Vitamin D deficiency   . Aortic stenosis   . Hyperkalemia   . Hypocalcemia   . Lymphadenopathy   . Prostate cancer   . Status post lung surgery 12/07/2001    BENIGHN LESION ON RIGHT LUNG REMOVED  . Ascites     HISTORY  . History of colonic diverticulitis   . Olecranon bursitis     Past Surgical History  Procedure Laterality Date  . Hemorrhoid surgery  12/07/1998  . Lung surgery  12/07/2001    BENIGN LESION ON RIGHT LUNG REMOVED  . Colonoscopy    . Femur im nail Left 01/21/2013    Procedure: INTRAMEDULLARY (IM) NAIL FEMORAL;  Surgeon: Velna Ochs, MD;  Location: [facility]S;  Service: Orthopedics;  Laterality: Left;  . Esophagogastroduodenoscopy N/A 03/16/2013    Procedure: ESOPHAGOGASTRODUODENOSCOPY (EGD);  Surgeon: Florencia Reasons, MD;  Location: [facility] ENDOSCOPY;  Service: Endoscopy;  Laterality: N/A;  will probably want to do at  bedside in ICU---bed placement pending    Family History  Problem Relation Age of Onset  . Hypertension Mother 85  . Hypertension Father 66  . Hypertension Sister   . Hypertension Brother   . Hypertension Sister   . Hypertension Brother   . Hypertension Brother   . Hypertension Brother   . Hypertension Brother    Social History:  reports that he has been smoking Cigarettes.  He has been smoking about 1.50 packs per day. He does not have any smokeless tobacco history on file. He reports that he drinks alcohol. He reports that he does not use illicit drugs.  Allergies: No Known Allergies  Medications:  Current Facility-Administered Medications  Medication Dose Route Frequency Provider Last Rate Last Dose  . 0.9 %  sodium chloride infusion   Intravenous Continuous Laray Anger, DO 75 mL/hr at 09/19/13 1246    . cefTRIAXone (ROCEPHIN) 1 g in dextrose 5 % 50 mL IVPB  1 g Intravenous Once Laray Anger, DO      . doxycycline (VIBRA-TABS) tablet 100 mg  100 mg Oral Once Laray Anger, DO       Current Outpatient Prescriptions  Medication Sig Dispense Refill  . bicalutamide (CASODEX) 50 MG tablet Take 50 mg by mouth daily.      . clopidogrel (PLAVIX) 75 MG tablet Take 1 tablet (75 mg total) by mouth daily.  30 tablet  0  . docusate sodium (COLACE) 100 MG capsule Take 100 mg by mouth 2 (two) times daily.      . furosemide (LASIX) 40 MG tablet Take 1 tablet (40 mg total) by mouth daily.  3 tablet  0  . HYDROcodone-acetaminophen (NORCO/VICODIN) 5-325 MG per tablet Take 1-2 tablets by mouth every 6 (six) hours as needed for pain.      Marland Kitchen LORazepam (ATIVAN) 2 MG tablet Take 1 mg by mouth every 8 (eight) hours as needed for anxiety.      Marland Kitchen menthol-cetylpyridinium (CEPACOL) 3 MG lozenge Take 1 lozenge by mouth as needed (For sore throat.).      Marland Kitchen metoprolol  succinate (TOPROL-XL) 25 MG 24 hr tablet Take 1 tablet (25 mg total) by mouth daily.  30 tablet  0  . mineral oil-hydrophilic petrolatum (AQUAPHOR) ointment Apply topically daily.  420 g  0  . Multiple Vitamin (MULTIVITAMIN WITH MINERALS) TABS Take 1 tablet by mouth daily.      . nicotine (NICODERM CQ - DOSED IN MG/24 HOURS) 14 mg/24hr patch Place 1 patch onto the skin daily.      Marland Kitchen olmesartan (BENICAR) 20 MG tablet Take 1 tablet (20 mg total) by mouth daily.  30 tablet  0  . omega-3 acid ethyl esters (LOVAZA) 1 G capsule Take 2 g by mouth 2 (two) times daily.      Marland Kitchen thiamine 100 MG tablet Take 100 mg by mouth daily.         ROS:                                                                                                                                       History obtained from the patient  General ROS: negative for - chills, fatigue, fever, night sweats, weight gain or weight loss Psychological ROS: negative for - behavioral disorder, hallucinations, memory difficulties, mood swings or suicidal ideation Ophthalmic ROS: negative for - blurry vision, double vision, eye pain or loss of vision ENT ROS: negative for - epistaxis, nasal discharge, oral lesions, sore throat, tinnitus or vertigo Allergy and Immunology ROS: negative for - hives or itchy/watery eyes  Hematological and Lymphatic ROS: negative for - bleeding problems, bruising or swollen lymph nodes Endocrine ROS: negative for - galactorrhea, hair pattern changes, polydipsia/polyuria or temperature intolerance Respiratory ROS: negative for - cough, hemoptysis, shortness of breath or wheezing Cardiovascular ROS: negative for - chest pain, dyspnea on exertion, edema or irregular heartbeat Gastrointestinal ROS: negative for - abdominal pain, diarrhea, hematemesis, nausea/vomiting or stool incontinence Genito-Urinary ROS: negative for - dysuria, hematuria, incontinence or urinary frequency/urgency Musculoskeletal ROS: negative for -  joint swelling or muscular weakness Neurological ROS: as noted in HPI Dermatological ROS: negative for rash and skin lesion changes  Neurologic Examination:                                                                                                      Blood pressure 165/99, pulse 65, temperature 97.5 F (36.4 C), temperature source Oral, resp. rate 33, SpO2 95.00%.   Mental Status: Alert, oriented, thought content appropriate.  Speech dysarthric without evidence of aphasia.  Able to follow 3 step commands without difficulty. Cranial Nerves: II: Discs flat bilaterally; Visual fields grossly normal, pupils equal, round, reactive to light and accommodation III,IV, VI: ptosis not present, extra-ocular motions intact bilaterally V,VII: smile asymmetric on the left, facial light touch sensation normal bilaterally VIII: hearing normal bilaterally IX,X: gag reflex present XI: bilateral shoulder shrug XII: midline tongue extension Motor: Right : Upper extremity   5/5    Left:     Upper extremity   5/5  Lower extremity   5/5     Lower extremity   5/5 Tone and bulk:normal tone throughout; no atrophy noted Sensory: Pinprick and light touch intact throughout, bilaterally Deep Tendon Reflexes:  Right: Upper Extremity   Left: Upper extremity   biceps (C-5 to C-6) 2/4   biceps (C-5 to C-6) 2/4 tricep (C7) 2/4    triceps (C7) 2/4 Brachioradialis (C6) 2/4  Brachioradialis (C6) 2/4  Lower Extremity Lower Extremity  quadriceps (L-2 to L-4) 1/4   quadriceps (L-2 to L-4) 1/4 Achilles (S1) 0/4   Achilles (S1) 0/4  Plantars: Right: downgoing   Left: downgoing Cerebellar: normal finger-to-nose,  normal heel-to-shin test Gait: not tested CV: pulses palpable throughout    Results for orders placed during the hospital encounter of 09/19/13 (from the past 48 hour(s))  GLUCOSE, CAPILLARY     Status: None   Collection Time    09/19/13 12:05 PM      Result Value Range   Glucose-Capillary 80  70  - 99 mg/dL   Comment 1 Notify RN     Comment 2 Documented in Chart    ETHANOL     Status: None   Collection Time    09/19/13 12:30 PM      Result Value Range   Alcohol, Ethyl (B) <11  0 - 11 mg/dL   Comment:            LOWEST DETECTABLE LIMIT FOR     SERUM ALCOHOL IS 11 mg/dL     FOR MEDICAL PURPOSES ONLY  PROTIME-INR     Status: None  Collection Time    09/19/13 12:30 PM      Result Value Range   Prothrombin Time 13.1  11.6 - 15.2 seconds   INR 1.01  0.00 - 1.49  APTT     Status: None   Collection Time    09/19/13 12:30 PM      Result Value Range   aPTT 27  24 - 37 seconds  CBC     Status: Abnormal   Collection Time    09/19/13 12:30 PM      Result Value Range   WBC 7.2  4.0 - 10.5 K/uL   RBC 4.88  4.22 - 5.81 MIL/uL   Hemoglobin 14.7  13.0 - 17.0 g/dL   HCT 40.9  81.1 - 91.4 %   MCV 88.9  78.0 - 100.0 fL   MCH 30.1  26.0 - 34.0 pg   MCHC 33.9  30.0 - 36.0 g/dL   RDW 78.2  95.6 - 21.3 %   Platelets 116 (*) 150 - 400 K/uL   Comment: PLATELET COUNT CONFIRMED BY SMEAR  DIFFERENTIAL     Status: None   Collection Time    09/19/13 12:30 PM      Result Value Range   Neutrophils Relative % 65  43 - 77 %   Neutro Abs 4.7  1.7 - 7.7 K/uL   Lymphocytes Relative 25  12 - 46 %   Lymphs Abs 1.8  0.7 - 4.0 K/uL   Monocytes Relative 7  3 - 12 %   Monocytes Absolute 0.5  0.1 - 1.0 K/uL   Eosinophils Relative 2  0 - 5 %   Eosinophils Absolute 0.1  0.0 - 0.7 K/uL   Basophils Relative 1  0 - 1 %   Basophils Absolute 0.1  0.0 - 0.1 K/uL  COMPREHENSIVE METABOLIC PANEL     Status: Abnormal   Collection Time    09/19/13 12:30 PM      Result Value Range   Sodium 138  135 - 145 mEq/L   Potassium 4.1  3.5 - 5.1 mEq/L   Chloride 98  96 - 112 mEq/L   CO2 22  19 - 32 mEq/L   Glucose, Bld 88  70 - 99 mg/dL   BUN 25 (*) 6 - 23 mg/dL   Creatinine, Ser 0.86 (*) 0.50 - 1.35 mg/dL   Calcium 57.8  8.4 - 46.9 mg/dL   Total Protein 7.7  6.0 - 8.3 g/dL   Albumin 4.5  3.5 - 5.2 g/dL   AST  20  0 - 37 U/L   ALT 22  0 - 53 U/L   Alkaline Phosphatase 95  39 - 117 U/L   Total Bilirubin 1.2  0.3 - 1.2 mg/dL   GFR calc non Af Amer 40 (*) >90 mL/min   GFR calc Af Amer 46 (*) >90 mL/min   Comment: (NOTE)     The eGFR has been calculated using the CKD EPI equation.     This calculation has not been validated in all clinical situations.     eGFR's persistently <90 mL/min signify possible Chronic Kidney     Disease.  TROPONIN I     Status: None   Collection Time    09/19/13 12:30 PM      Result Value Range   Troponin I <0.30  <0.30 ng/mL   Comment:            Due to the release kinetics of cTnI,  a negative result within the first hours     of the onset of symptoms does not rule out     myocardial infarction with certainty.     If myocardial infarction is still suspected,     repeat the test at appropriate intervals.  LACTIC ACID, PLASMA     Status: None   Collection Time    09/19/13 12:30 PM      Result Value Range   Lactic Acid, Venous 1.9  0.5 - 2.2 mmol/L  URINE RAPID DRUG SCREEN (HOSP PERFORMED)     Status: None   Collection Time    09/19/13  1:31 PM      Result Value Range   Opiates NONE DETECTED  NONE DETECTED   Cocaine NONE DETECTED  NONE DETECTED   Benzodiazepines NONE DETECTED  NONE DETECTED   Amphetamines NONE DETECTED  NONE DETECTED   Tetrahydrocannabinol NONE DETECTED  NONE DETECTED   Barbiturates NONE DETECTED  NONE DETECTED   Comment:            DRUG SCREEN FOR MEDICAL PURPOSES     ONLY.  IF CONFIRMATION IS NEEDED     FOR ANY PURPOSE, NOTIFY LAB     WITHIN 5 DAYS.                LOWEST DETECTABLE LIMITS     FOR URINE DRUG SCREEN     Drug Class       Cutoff (ng/mL)     Amphetamine      1000     Barbiturate      200     Benzodiazepine   200     Tricyclics       300     Opiates          300     Cocaine          300     THC              50  URINALYSIS W MICROSCOPIC + REFLEX CULTURE     Status: Abnormal   Collection Time    09/19/13  1:31 PM       Result Value Range   Color, Urine YELLOW  YELLOW   APPearance CLEAR  CLEAR   Specific Gravity, Urine 1.016  1.005 - 1.030   pH 5.0  5.0 - 8.0   Glucose, UA NEGATIVE  NEGATIVE mg/dL   Hgb urine dipstick NEGATIVE  NEGATIVE   Bilirubin Urine SMALL (*) NEGATIVE   Ketones, ur 15 (*) NEGATIVE mg/dL   Protein, ur 30 (*) NEGATIVE mg/dL   Urobilinogen, UA 1.0  0.0 - 1.0 mg/dL   Nitrite NEGATIVE  NEGATIVE   Leukocytes, UA NEGATIVE  NEGATIVE   Bacteria, UA RARE  RARE   Squamous Epithelial / LPF RARE  RARE   Dg Chest 2 View  09/19/2013   CLINICAL DATA:  Chest pain and congestion  EXAM: CHEST  2 VIEW  COMPARISON:  03/16/2013  FINDINGS: Cardiac shadow remains enlarged. Patchy infiltrate is noted throughout the mid and lower right lung no sizable effusion is noted. No acute bony abnormality is seen.  IMPRESSION: Patchy right lung infiltrate.   Electronically Signed   By: Alcide Clever M.D.   On: 09/19/2013 12:38   Ct Head Wo Contrast  09/19/2013   CLINICAL DATA:  Left-sided facial droop. Slurred speech. Hypertension. Hyperlipidemia.  EXAM: CT HEAD WITHOUT CONTRAST  TECHNIQUE: Contiguous axial images were obtained from the base of the  skull through the vertex without intravenous contrast.  COMPARISON:  01/20/2013.  FINDINGS: No intracranial hemorrhage.  Prominent small vessel disease type changes without CT evidence of large acute infarct. Streak artifacts slightly limits evaluation of the posterior fossa structures.  Global atrophy without hydrocephalus.  No intracranial mass lesion noted on this unenhanced exam.  Vascular calcifications.  Dental disease.  IMPRESSION: No intracranial hemorrhage.  Prominent small vessel disease type changes without CT evidence of large acute infarct. Streak artifacts slightly limits evaluation of the posterior fossa structures.  Global atrophy without hydrocephalus.   Electronically Signed   By: Bridgett Larsson M.D.   On: 09/19/2013 12:54    Assessment and plan discussed  with with attending physician and they are in agreement.    Felicie Morn PA-C [facility] [phone]  09/19/2013, 2:27 PM   Assessment: 77 y.o. male with known stroke risk factors of HTN and hyperlipidemia. Patient was LSN on Saturday and awoke on Sunday having left facial droop and dysarthria. While in ED patient had a MRI showing right parietal infarct. Given Afib noted en route likely cardio embolic and thus will suggest starting xarelto if no contraindications.   Stroke Risk Factors - hyperlipidemia and hypertension   Recommend:   Plan: 1. HgbA1c, fasting lipid panel 2. PT consult, OT consult, Speech consult 3. Echocardiogram 4. Carotid dopplers 5. Prophylactic therapy-Antiplatelet med: Plavix - dose 75 mg daily 6. Risk  Factor modification 7. Stroke team (Dr. Pearlean Brownie) to follow in AM

## 2013-09-20 ENCOUNTER — Inpatient Hospital Stay (HOSPITAL_COMMUNITY): Payer: Medicare Other

## 2013-09-20 DIAGNOSIS — I059 Rheumatic mitral valve disease, unspecified: Secondary | ICD-10-CM

## 2013-09-20 DIAGNOSIS — I6350 Cerebral infarction due to unspecified occlusion or stenosis of unspecified cerebral artery: Secondary | ICD-10-CM

## 2013-09-20 DIAGNOSIS — J189 Pneumonia, unspecified organism: Secondary | ICD-10-CM

## 2013-09-20 DIAGNOSIS — N289 Disorder of kidney and ureter, unspecified: Secondary | ICD-10-CM

## 2013-09-20 DIAGNOSIS — R29810 Facial weakness: Secondary | ICD-10-CM

## 2013-09-20 DIAGNOSIS — G9340 Encephalopathy, unspecified: Secondary | ICD-10-CM

## 2013-09-20 LAB — HEMOGLOBIN A1C: Mean Plasma Glucose: 97 mg/dL (ref <117)

## 2013-09-20 LAB — LIPID PANEL
Cholesterol: 120 mg/dL (ref 0–200)
HDL: 39 mg/dL — ABNORMAL LOW (ref >39)
Total CHOL/HDL Ratio: 3.1 RATIO
VLDL: 14 mg/dL (ref 0–40)

## 2013-09-20 MED ORDER — STARCH (THICKENING) PO POWD
ORAL | Status: DC | PRN
  Filled 2013-09-20 (×2): qty 227

## 2013-09-20 NOTE — Evaluation (Signed)
Occupational Therapy Evaluation Patient Details Name: Matthew Mahoney MRN: [id number] DOB: Sep 11, 1936 Today's Date: 09/20/2013 Time: 4098-1191 OT Time Calculation (min): 27 min  OT Assessment / Plan / Recommendation History of present illness 77 yo male admitted with Lt side weakness and blurry vision. MRI (+)  Rt occipital infarct and Rt frontal parietal infarct.   Clinical Impression   PT admitted with Lt side weakness and blurred vision. Pt currently with functional limitiations due to the deficits listed below (see OT problem list). Pt will benefit from skilled OT to increase their independence and safety with adls and balance to allow discharge SNF. Pt unsteady gait and decr awareness to balance deficit. Question diplopia but patient reports none with testing. Pt occluding Lt eye (closing eye) with testing. Pt with Lt eye weakness noted and decr speed with tracking.     OT Assessment  Patient needs continued OT Services    Follow Up Recommendations  SNF;Supervision/Assistance - 24 hour    Barriers to Discharge      Equipment Recommendations  None recommended by OT    Recommendations for Other Services    Frequency  Min 2X/week    Precautions / Restrictions Precautions Precautions: Fall Restrictions Weight Bearing Restrictions: No   Pertinent Vitals/Pain No pain reported    ADL  Eating/Feeding: NPO Grooming: Wash/dry face;Wash/dry hands;Supervision/safety Where Assessed - Grooming: Supported sitting (using yonker for oral secretions) Lower Body Dressing: Modified independent Where Assessed - Lower Body Dressing: Unsupported sitting (don red socks over home socks; able to cross bil LE) Toilet Transfer: Moderate assistance Toilet Transfer Method: Sit to stand Toilet Transfer Equipment: Raised toilet seat with arms (or 3-in-1 over toilet) Toileting - Clothing Manipulation and Hygiene: Moderate assistance Where Assessed - Toileting Clothing Manipulation and Hygiene: Sit  to stand from 3-in-1 or toilet Equipment Used: Gait belt;Cane Transfers/Ambulation Related to ADLs: Pt ambulating with BIL UE support with MOD (A) Pt holding cane in Rt hand and reaching for bed with Lt UE. pt could benefit from RW use. Pt has RW at home ADL Comments: Pt in bed supine on arrival. pt with noticeable Lt lip closure deficits and oral secretions. Pt with loose teeth on bottom row. pt completed bed mobility with HOB elevated. pt static sitting and don socks sitting. Pt ambulated to chair.     OT Diagnosis: Generalized weakness;Disturbance of vision  OT Problem List: Decreased strength;Decreased activity tolerance;Impaired balance (sitting and/or standing);Impaired vision/perception;Decreased safety awareness;Decreased knowledge of use of DME or AE;Decreased knowledge of precautions OT Treatment Interventions: Self-care/ADL training;Therapeutic exercise;DME and/or AE instruction;Therapeutic activities;Patient/family education;Balance training;Visual/perceptual remediation/compensation   OT Goals(Current goals can be found in the care plan section) Acute Rehab OT Goals Patient Stated Goal: none specifically stated OT Goal Formulation: With patient Time For Goal Achievement: 10/04/13 Potential to Achieve Goals: Good ADL Goals Pt Will Perform Grooming: with modified independence;standing Pt Will Perform Upper Body Bathing: with modified independence;standing Pt Will Perform Lower Body Bathing: with modified independence;sit to/from stand Pt Will Transfer to Toilet: with modified independence;bedside commode Pt Will Perform Toileting - Clothing Manipulation and hygiene: with modified independence;sit to/from stand  Visit Information  Last OT Received On: 09/20/13 Assistance Needed: +1 PT/OT Co-Evaluation/Treatment: Yes History of Present Illness: 77 yo male admitted with Lt side weakness and blurry vision. MRI (+)        Prior Functioning     Home Living Family/patient  expects to be discharged to:: Private residence Living Arrangements: Alone Available Help at Discharge: Family;Available PRN/intermittently (sister) Type  of Home: Apartment Home Access: Level entry Home Layout: One level Home Equipment: Bedside commode;Cane - single point;Walker - 4 wheels  Lives With: Alone Prior Function Level of Independence: Needs assistance ADL's / Homemaking Assistance Needed: sister does house keeping, does [employer] / Swallowing Assistance Needed: has full top denture that is loose at this time affecting speech in addition to new facail left side weakness Comments: drives Communication Communication: Expressive difficulties (slurred speech ) Dominant Hand: Right         Vision/Perception Vision - History Baseline Vision: Wears glasses only for reading Vision - Assessment Eye Alignment: Within Functional Limits Vision Assessment: Vision tested Ocular Range of Motion: Within Functional Limits Tracking/Visual Pursuits: Left eye does not track medially;Decreased smoothness of horizontal tracking;Decreased smoothness of eye movement to LEFT inferior field;Requires cues, head turns, or add eye shifts to track;Impaired - to be further tested in functional context Convergence: Impaired (comment) Diplopia Assessment: Other (comment) (Lt eye malaligned so diplopia should be present) Additional Comments: Pt demonstrates Lt eye weakness with tracking. Pt when tracking right - Lt eye is not aligned with Rt eye. pt closing eye to demonstrate occlusion. Pt verbalizes not seeing diplopia but occluding vision. Question diplopia    Cognition  Cognition Arousal/Alertness: Awake/alert Behavior During Therapy: WFL for tasks assessed/performed Overall Cognitive Status: Impaired/Different from baseline (Simultaneous filing. User may not have seen previous data.)    Extremity/Trunk Assessment Upper Extremity Assessment Upper Extremity Assessment: Overall WFL for  tasks assessed Lower Extremity Assessment Lower Extremity Assessment: Defer to PT evaluation Cervical / Trunk Assessment Cervical / Trunk Assessment: Kyphotic     Mobility Bed Mobility Bed Mobility: Left Sidelying to Sit;Supine to Sit;Sitting - Scoot to Edge of Bed Left Sidelying to Sit: 4: Min guard;HOB elevated;With rails (HOB 53 degrees) Supine to Sit: 4: Min guard;With rails Sitting - Scoot to Edge of Bed: 4: Min guard Details for Bed Mobility Assistance: incr time and use of bed rail Transfers Transfers: Sit to Stand;Stand to Sit Sit to Stand: 3: Mod assist;With upper extremity assist;From bed Stand to Sit: 3: Mod assist;With upper extremity assist;To chair/3-in-1 Details for Transfer Assistance: pt relies on UE for support     Exercise     Balance Balance Balance Assessed: Yes Static Sitting Balance Static Sitting - Balance Support: Feet supported;No upper extremity supported Static Sitting - Level of Assistance: 6: Modified independent (Device/Increase time) Static Sitting - Comment/# of Minutes: ~3 minutes. able to shift weight to remove gown from under hip. Pt able to lean posteriorly to don socks Static Standing Balance Static Standing - Balance Support: Right upper extremity supported;During functional activity Static Standing - Level of Assistance: 3: Mod assist   End of Session OT - End of Session Activity Tolerance: Patient tolerated treatment well Patient left: in chair;with call bell/phone within reach Nurse Communication: Mobility status;Precautions  GO     Boone Master B 09/20/2013, 10:30 AM Pager: [phone]

## 2013-09-20 NOTE — Progress Notes (Signed)
VASCULAR LAB PRELIMINARY  PRELIMINARY  PRELIMINARY  PRELIMINARY  Carotid duplex completed.    Preliminary report:  Bilateral:  1-39% ICA stenosis.  Vertebral artery flow is antegrade.     Lesette Frary, RVS 09/20/2013, 12:37 PM

## 2013-09-20 NOTE — Evaluation (Signed)
Speech Language Pathology Evaluation Patient Details Name: Matthew Mahoney MRN: [id number] DOB: 1936/03/05 Today's Date: 09/20/2013 Time: 0912-0950 SLP Time Calculation (min): 38 min  Problem List:  Patient Active Problem List   Diagnosis Date Noted  . CVA (cerebral infarction) 09/19/2013  . CAP (community acquired pneumonia) 09/19/2013  . CKD (chronic kidney disease), stage III 09/19/2013  . Acute blood loss anemia 03/18/2013  . Hemorrhagic shock 03/16/2013  . GIB (gastrointestinal bleeding) 03/16/2013  . Acute renal insufficiency 03/16/2013  . Uremia 03/16/2013  . Acute encephalopathy 03/16/2013  . Postoperative anemia 01/23/2013  . Fall at home 01/21/2013  . Hip fracture, left 01/21/2013  . Alcohol abuse 01/21/2013  . Tobacco abuse 01/21/2013  . Hyponatremia 01/21/2013  . Hypertension   . Edema of leg   . COPD (chronic obstructive pulmonary disease)   . Choledocholithiasis   . Coronary arteriosclerosis   . Hyperlipidemia   . Ichthyosis   . Mitral regurgitation   . Onychomycosis   . Vitamin D deficiency   . Aortic stenosis   . Hyperkalemia   . Hypocalcemia   . Lymphadenopathy   . Prostate cancer   . Ascites   . History of colonic diverticulitis   . Olecranon bursitis   . UNSPECIFIED PERIPHERAL VASCULAR DISEASE 02/18/2011  . OTHER SYMPTOMS INVOLVING CARDIOVASCULAR SYSTEM 02/18/2011   Past Medical History:  Past Medical History  Diagnosis Date  . Hypertension   . Edema of leg   . COPD (chronic obstructive pulmonary disease)   . Choledocholithiasis   . Coronary arteriosclerosis   . Hyperlipidemia   . Ichthyosis   . Mitral regurgitation   . Onychomycosis   . Vitamin D deficiency   . Aortic stenosis   . Hyperkalemia   . Hypocalcemia   . Lymphadenopathy   . Prostate cancer   . Status post lung surgery 12/07/2001    BENIGHN LESION ON RIGHT LUNG REMOVED  . Ascites     HISTORY  . History of colonic diverticulitis   . Olecranon bursitis    Past Surgical  History:  Past Surgical History  Procedure Laterality Date  . Hemorrhoid surgery  12/07/1998  . Lung surgery  12/07/2001    BENIGN LESION ON RIGHT LUNG REMOVED  . Colonoscopy    . Femur im nail Left 01/21/2013    Procedure: INTRAMEDULLARY (IM) NAIL FEMORAL;  Surgeon: Velna Ochs, MD;  Location: [facility];  Service: Orthopedics;  Laterality: Left;  . Esophagogastroduodenoscopy N/A 03/16/2013    Procedure: ESOPHAGOGASTRODUODENOSCOPY (EGD);  Surgeon: Florencia Reasons, MD;  Location: [facility] ENDOSCOPY;  Service: Endoscopy;  Laterality: N/A;  will probably want to do at bedside in ICU---bed placement pending   HPI:  77 y.o. male with known stroke risk factors of HTN and hyperlipidemia. Patient was LSN on Saturday and awoke on Sunday having left facial droop and dysarthria. While in ED patient had a MRI showing right parietal infarct. Pt also reported to have difficutly eating.    Assessment / Plan / Recommendation Clinical Impression  Pt demonstrating mild dysarthria due to left lingual and labial weakness, but also impaired by severely deteriorated dentition and poor management of secretions. Pt responded successfully to min verbal cues for slow rate and overarticulation for increased intelligibility at phrase level. Linguistic function is WNL, and basic cognition is also functional. Pt required moderate verbal cues to complete with higher level verbal reasoning tasks and may have poor decision making at baseline based on observations of self care. SLP will follow acutely for  speech intelligiblity strategies and assessment of higher level functional cognition. Pt could benefit from f/u SLP at next level of care, likely SNF.     SLP Assessment  Patient needs continued Speech Lanaguage Pathology Services    Follow Up Recommendations  Skilled Nursing facility    Frequency and Duration min 2x/week  1 week   Pertinent Vitals/Pain NA   SLP Goals  SLP Goals Potential to Achieve Goals: Good  SLP  Evaluation Prior Functioning  Cognitive/Linguistic Baseline: Within functional limits Type of Home: Apartment  Lives With: Alone Available Help at Discharge: Family;Available PRN/intermittently (sister) Vocation: Retired (had a fall with hip fx, walks with a cane)   Cognition  Overall Cognitive Status: Impaired/Different from baseline (Simultaneous filing. User may not have seen previous data.) Arousal/Alertness: Awake/alert Orientation Level: Oriented X4 Attention: Focused;Sustained;Selective;Alternating Focused Attention: Appears intact Sustained Attention: Appears intact Selective Attention: Appears intact Alternating Attention: Appears intact Memory: Appears intact Awareness: Appears intact Problem Solving: Impaired Problem Solving Impairment: Verbal complex Executive Function: Reasoning Reasoning: Impaired Reasoning Impairment: Verbal complex Safety/Judgment: Appears intact    Comprehension  Auditory Comprehension Overall Auditory Comprehension: Appears within functional limits for tasks assessed Reading Comprehension Reading Status: Not tested    Expression Verbal Expression Overall Verbal Expression: Appears within functional limits for tasks assessed Written Expression Dominant Hand: Right   Oral / Motor Oral Motor/Sensory Function Overall Oral Motor/Sensory Function: Impaired Labial ROM: Reduced left Labial Symmetry: Abnormal symmetry left Labial Strength: Reduced Labial Sensation: Reduced Lingual ROM: Reduced left Lingual Symmetry: Abnormal symmetry left Lingual Strength: Reduced Facial ROM: Reduced left Facial Symmetry: Left droop Facial Strength: Reduced Facial Sensation: Reduced Velum: Within Functional Limits Mandible: Within Functional Limits Motor Speech Overall Motor Speech: Impaired Respiration: Impaired Level of Impairment: Phrase Phonation: Wet Resonance: Within functional limits Articulation: Impaired Level of Impairment:  Word Intelligibility: Intelligibility reduced Word: 75-100% accurate Phrase: 75-100% accurate Sentence: 75-100% accurate Conversation: 75-100% accurate Interfering Components: Inadequate dentition   GO    Harlon Ditty, MA CCC-SLP [phone]  Claudine Mouton 09/20/2013, 10:22 AM

## 2013-09-20 NOTE — Procedures (Signed)
Objective Swallowing Evaluation: Modified Barium Swallowing Study  Patient Details  Name: Matthew Mahoney MRN: [id number] Date of Birth: 02-06-1936  Today's Date: 09/20/2013 Time: 1035-1056 SLP Time Calculation (min): 21 min  Past Medical History:  Past Medical History  Diagnosis Date  . Hypertension   . Edema of leg   . COPD (chronic obstructive pulmonary disease)   . Choledocholithiasis   . Coronary arteriosclerosis   . Hyperlipidemia   . Ichthyosis   . Mitral regurgitation   . Onychomycosis   . Vitamin D deficiency   . Aortic stenosis   . Hyperkalemia   . Hypocalcemia   . Lymphadenopathy   . Prostate cancer   . Status post lung surgery 12/07/2001    BENIGHN LESION ON RIGHT LUNG REMOVED  . Ascites     HISTORY  . History of colonic diverticulitis   . Olecranon bursitis    Past Surgical History:  Past Surgical History  Procedure Laterality Date  . Hemorrhoid surgery  12/07/1998  . Lung surgery  12/07/2001    BENIGN LESION ON RIGHT LUNG REMOVED  . Colonoscopy    . Femur im nail Left 01/21/2013    Procedure: INTRAMEDULLARY (IM) NAIL FEMORAL;  Surgeon: Velna Ochs, MD;  Location: [facility];  Service: Orthopedics;  Laterality: Left;  . Esophagogastroduodenoscopy N/A 03/16/2013    Procedure: ESOPHAGOGASTRODUODENOSCOPY (EGD);  Surgeon: Florencia Reasons, MD;  Location: [facility] ENDOSCOPY;  Service: Endoscopy;  Laterality: N/A;  will probably want to do at bedside in ICU---bed placement pending   HPI:  77 y.o. male with known stroke risk factors of HTN and hyperlipidemia. Patient was LSN on Saturday and awoke on Sunday having left facial droop and dysarthria. While in ED patient had a MRI showing right parietal infarct. Pt also reported to have difficutly eating.      Assessment / Plan / Recommendation Clinical Impression  Dysphagia Diagnosis: Moderate pharyngeal phase dysphagia;Severe pharyngeal phase dysphagia;Moderate oral phase dysphagia;Severe oral phase dysphagia Clinical  impression: Patient presents with a moderate-severe sensory motor based oropharyngeal dysphagia marked by severe left sided oral deficits resulting in decreased bolus cohesion, anterior spillage, decreased posterior propulsion, and oral residuals followed by a delay in swallow initiation which leads to penetration of all consistencies tested. The use of a chin tuck assisted to decrease residue and penetration episodes which were flash with puree and honey thick liquids however patient continued to penetrate thinner consistencies. SLP provided verbal and visual cueing for this strategy as well as tight labial seal which was minimally effective at decreasing anterior labial spillage. Although aspiration risk remains moderately high, patient very receptive to use of compensatory strategies and aspiration precautions which were effective to decrease risks. Recommend initiation of a conservative diet with strict use of precautions and close SLP f/u.      Treatment Recommendation  Therapy as outlined in treatment plan below    Diet Recommendation Dysphagia 1 (Puree);Honey-thick liquid   Liquid Administration via: Spoon Medication Administration: Crushed with puree Supervision: Patient able to self feed;Full supervision/cueing for compensatory strategies Compensations: Slow rate;Small sips/bites;Check for anterior loss;Check for pocketing;Multiple dry swallows after each bite/sip;Effortful swallow Postural Changes and/or Swallow Maneuvers: Chin tuck;Seated upright 90 degrees;Upright 30-60 min after meal    Other  Recommendations Recommended Consults: MBS Oral Care Recommendations: Oral care before and after PO Other Recommendations: Have oral suction available (SLp placed)   Follow Up Recommendations  Skilled Nursing facility    Frequency and Duration min 3x week  2 weeks  SLP Swallow Goals Patient will utilize recommended strategies during swallow to increase swallowing safety with:  Supervision/safety Swallow Study Goal #2 - Progress: Not met   General HPI: 77 y.o. male with known stroke risk factors of HTN and hyperlipidemia. Patient was LSN on Saturday and awoke on Sunday having left facial droop and dysarthria. While in ED patient had a MRI showing right parietal infarct. Pt also reported to have difficutly eating.  Type of Study: Modified Barium Swallowing Study Reason for Referral: Objectively evaluate swallowing function Previous Swallow Assessment: none Diet Prior to this Study: NPO Temperature Spikes Noted: No Respiratory Status: Nasal cannula History of Recent Intubation: No Behavior/Cognition: Alert;Cooperative;Pleasant mood Oral Cavity - Dentition: Dentures, top;Poor condition;Missing dentition Oral Motor / Sensory Function: Impaired - see Bedside swallow eval Self-Feeding Abilities: Able to feed self Patient Positioning: Upright in chair Baseline Vocal Quality: Wet Volitional Cough: Wet Volitional Swallow: Able to elicit Anatomy: Other (Comment) (? ostephytes along cervical spine; MD not present to confirm) Pharyngeal Secretions: Not observed secondary MBS (however suspect based on wet vocal quality)    Reason for Referral Objectively evaluate swallowing function   Oral Phase Oral Preparation/Oral Phase Oral Phase: Impaired Oral - Honey Oral - Honey Teaspoon: Left anterior bolus loss;Weak lingual manipulation;Lingual pumping;Reduced posterior propulsion;Left pocketing in lateral sulci;Lingual/palatal residue;Delayed oral transit Oral - Nectar Oral - Nectar Teaspoon: Left anterior bolus loss;Weak lingual manipulation;Lingual pumping;Reduced posterior propulsion;Left pocketing in lateral sulci;Lingual/palatal residue;Delayed oral transit Oral - Solids Oral - Puree: Left anterior bolus loss;Weak lingual manipulation;Lingual pumping;Reduced posterior propulsion;Left pocketing in lateral sulci;Lingual/palatal residue;Delayed oral transit   Pharyngeal  Phase Pharyngeal Phase Pharyngeal Phase: Impaired Pharyngeal - Honey Pharyngeal - Honey Teaspoon: Delayed swallow initiation;Premature spillage to valleculae;Reduced tongue base retraction;Pharyngeal residue - valleculae;Penetration/Aspiration before swallow;Compensatory strategies attempted (Comment) Penetration/Aspiration details (honey teaspoon): Material enters airway, remains ABOVE vocal cords then ejected out (flash penetration decreases residue and penetration) Pharyngeal - Nectar Pharyngeal - Nectar Teaspoon: Delayed swallow initiation;Premature spillage to pyriform sinuses;Reduced tongue base retraction;Penetration/Aspiration during swallow;Penetration/Aspiration after swallow;Pharyngeal residue - valleculae;Compensatory strategies attempted (Comment) Penetration/Aspiration details (nectar teaspoon): Material enters airway, CONTACTS cords and not ejected out (flash penetration decreases residue ) Pharyngeal - Solids Pharyngeal - Puree: Delayed swallow initiation;Premature spillage to valleculae;Reduced tongue base retraction;Pharyngeal residue - valleculae;Penetration/Aspiration before swallow Penetration/Aspiration details (puree): Material enters airway, remains ABOVE vocal cords then ejected out (flash penetration decreases residue and penetration)  Cervical Esophageal Phase    GO   Ferdinand Lango MA, CCC-SLP [phone]  Cervical Esophageal Phase Cervical Esophageal Phase: [facility]         Foye Damron Meryl 09/20/2013, 11:09 AM

## 2013-09-20 NOTE — Evaluation (Addendum)
Clinical/Bedside Swallow Evaluation Patient Details  Name: Matthew Mahoney MRN: [id number] Date of Birth: 03/28/1936  Today's Date: 09/20/2013 Time: 0912-0950 SLP Time Calculation (min): 38 min  Past Medical History:  Past Medical History  Diagnosis Date  . Hypertension   . Edema of leg   . COPD (chronic obstructive pulmonary disease)   . Choledocholithiasis   . Coronary arteriosclerosis   . Hyperlipidemia   . Ichthyosis   . Mitral regurgitation   . Onychomycosis   . Vitamin D deficiency   . Aortic stenosis   . Hyperkalemia   . Hypocalcemia   . Lymphadenopathy   . Prostate cancer   . Status post lung surgery 12/07/2001    BENIGHN LESION ON RIGHT LUNG REMOVED  . Ascites     HISTORY  . History of colonic diverticulitis   . Olecranon bursitis    Past Surgical History:  Past Surgical History  Procedure Laterality Date  . Hemorrhoid surgery  12/07/1998  . Lung surgery  12/07/2001    BENIGN LESION ON RIGHT LUNG REMOVED  . Colonoscopy    . Femur im nail Left 01/21/2013    Procedure: INTRAMEDULLARY (IM) NAIL FEMORAL;  Surgeon: Velna Ochs, MD;  Location: [facility];  Service: Orthopedics;  Laterality: Left;  . Esophagogastroduodenoscopy N/A 03/16/2013    Procedure: ESOPHAGOGASTRODUODENOSCOPY (EGD);  Surgeon: Florencia Reasons, MD;  Location: [facility] ENDOSCOPY;  Service: Endoscopy;  Laterality: N/A;  will probably want to do at bedside in ICU---bed placement pending   HPI:  77 y.o. male with known stroke risk factors of HTN and hyperlipidemia. Patient was LSN on Saturday and awoke on Sunday having left facial droop and dysarthria. While in ED patient had a MRI showing right parietal infarct. Pt also reported to have difficutly eating.    Assessment / Plan / Recommendation Clinical Impression  Pt demonstrates evidence of a moderate oral dysphagia due to left oral weakness, very poor dentition and poor management of secretions.  Trials of thin liquids result in immediate cough, indicative  of aspiration, though pt was able to transit puree boluses placed in right oral cavity without significant oral residuals or immediate evidence of aspiration. MBS warranted to determine if modified diet may be appropriate to reduce aspiration risk. To be completed this am, pt to remain NPO until MBS.    Aspiration Risk  Moderate    Diet Recommendation NPO        Other  Recommendations Recommended Consults: MBS Oral Care Recommendations: Oral care Q4 per protocol Other Recommendations: Have oral suction available (SLp placed)   Follow Up Recommendations  Skilled Nursing facility    Frequency and Duration min 2x/week      Pertinent Vitals/Pain NA    SLP Swallow Goals     Swallow Study Prior Functional Status  Cognitive/Linguistic Baseline: Within functional limits Type of Home: Apartment  Lives With: Alone Available Help at Discharge: Family;Available PRN/intermittently (sister) Vocation: Retired (had a fall with hip fx, walks with a cane)    General HPI: 77 y.o. male with known stroke risk factors of HTN and hyperlipidemia. Patient was LSN on Saturday and awoke on Sunday having left facial droop and dysarthria. While in ED patient had a MRI showing right parietal infarct. Pt also reported to have difficutly eating.  Type of Study: Bedside swallow evaluation Previous Swallow Assessment: none Diet Prior to this Study: NPO Temperature Spikes Noted: No Respiratory Status: Nasal cannula History of Recent Intubation: No Behavior/Cognition: Alert;Cooperative;Pleasant mood Oral Cavity - Dentition: Dentures,  top;Poor condition;Missing dentition Self-Feeding Abilities: Able to feed self Patient Positioning: Upright in bed Baseline Vocal Quality: Wet Volitional Cough: Wet Volitional Swallow: Able to elicit    Oral/Motor/Sensory Function Overall Oral Motor/Sensory Function: Impaired Labial ROM: Reduced left Labial Symmetry: Abnormal symmetry left Labial Strength: Reduced Labial  Sensation: Reduced Lingual ROM: Reduced left Lingual Symmetry: Abnormal symmetry left Lingual Strength: Reduced Lingual Sensation: Reduced Facial ROM: Reduced left Facial Symmetry: Left droop Facial Strength: Reduced Facial Sensation: Reduced Velum: Within Functional Limits Mandible: Within Functional Limits   Ice Chips Ice chips: Within functional limits Presentation: Spoon   Thin Liquid Thin Liquid: Impaired Presentation: Cup;Straw Oral Phase Impairments: Reduced labial seal;Impaired anterior to posterior transit;Reduced lingual movement/coordination Oral Phase Functional Implications: Left anterior spillage;Oral residue (pooling in anterior sulcus (saliva)) Pharyngeal  Phase Impairments: Suspected delayed Swallow;Wet Vocal Quality;Cough - Immediate    Nectar Thick Nectar Thick Liquid: Not tested   Honey Thick Honey Thick Liquid: Not tested   Puree Puree: Impaired Presentation: Spoon Oral Phase Impairments: Reduced labial seal Oral Phase Functional Implications:  (excessive saliva in anterior sulcus) Pharyngeal Phase Impairments: Suspected delayed Swallow   Solid   GO    Solid: Not tested      Harlon Ditty, MA CCC-SLP [phone]  Durga Saldarriaga, Riley Nearing 09/20/2013,10:32 AM

## 2013-09-20 NOTE — Progress Notes (Signed)
[facility] PROGRESS NOTE  Matthew Mahoney:[id number] DOB: August 10, 1936 DOA: 09/19/2013 PCP: Willow Ora, MD  Assessment/Plan: 1. CVA - Plavix 75 mg started per neurology - MRI:5 mm acute infarct right occipital lobe. Acute infarct right frontal  parietal cortex and subcortical white matter. - ECHO: pending  -Carotid dopplers:Bilateral: 1-39% ICA stenosis. Vertebral artery flow is antegrade - Speech therapy recommends dysphagia with honey thicken  -PT/OT recommends SNF   2. HTN Currently stable off antihypertensive   Range sbp 138-155/ dbp 73-89 for 09/20/2013 Will continue to monitor  3. Community acquired pneumonia -JY:NWGNFA infiltrate is noted throughout the mid and lower right lung no sizable effusion is noted. -Afebrile - started on rocephin and doxycycline due to prolonged QT interval - Given that patient has had trouble with swallowing and waited 3 days prior to presentation suspect patient most likely had aspiration pneumonia.  4. CKD Stage III -Creatinine 1.64 this am (09/20/13) which is patient's baseline -BMP in am    Code Status: Full  Family Communication: POC discussed with patient  Disposition Plan: pending neurology evaluation and  recommendations   Consultants:  Neurology  Procedures:  none  Antibiotics:  Rocephin started  09/19/2013  Doxycycline started 09/19/2013  HPI/Subjective: 77 year old male with history of HTN, Aortic stenosis, COPD, CKD stage III who presented to ED with slurred speech and left sided facial droop for three days starting 09/16/2013.  MRI revealed 5 MM acute infract in right occipital lobe.  Pt with left sided mouth droop complaining of drooling. Patient currently denies headache, SOB, chest pain, and numbness and tingling.   Objective: Filed Vitals:   09/20/13 0530  BP: 138/74  Pulse: 82  Temp: 97.7 F (36.5 C)  Resp: 18    Intake/Output Summary (Last 24 hours) at 09/20/13 1324 Last data filed at  09/19/13 1700  Gross per 24 hour  Intake      0 ml  Output    300 ml  Net   -300 ml   Filed Weights   09/19/13 1535  Weight: 83.598 kg (184 lb 4.8 oz)    Exam:   General: Pt in bed in NAK  Cardiovascular: S1 and S2  Present with no MRG. Radial and pedal pulses 2+ with no cyanosis or edema  Respiratory: regular unlabored breathing. Lungs CTA with no wheeze or rhonchi  Abdomen: symmetric soft, ND, NT . BS X4   Musculoskeletal: moves all extremities equally   Neurological: left mouth droop with drooling. Tongue deviated to left. Normal finger to nose   Data Reviewed: Basic Metabolic Panel:  Recent Labs Lab 09/19/13 1230 09/19/13 1935  NA 138  140  --   K 4.1  3.9  --   CL 98  102  --   CO2 22  --   GLUCOSE 88  88  --   BUN 25*  27*  --   CREATININE 1.63*  2.00* 1.64*  CALCIUM 10.0  --    Liver Function Tests:  Recent Labs Lab 09/19/13 1230  AST 20  ALT 22  ALKPHOS 95  BILITOT 1.2  PROT 7.7  ALBUMIN 4.5   No results found for this basename: LIPASE, AMYLASE,  in the last 168 hours No results found for this basename: AMMONIA,  in the last 168 hours CBC:  Recent Labs Lab 09/19/13 1230 09/19/13 1935  WBC 7.2 7.8  NEUTROABS 4.7  --   HGB 14.7  16.0 12.7*  HCT 43.4  47.0 37.4*  MCV 88.9  89.3  PLT 116* 99*   Cardiac Enzymes:  Recent Labs Lab 09/19/13 1230  TROPONINI <0.30   BNP (last 3 results) No results found for this basename: PROBNP,  in the last 8760 hours CBG:  Recent Labs Lab 09/19/13 1205  GLUCAP 80    No results found for this or any previous visit (from the past 240 hour(s)).   Studies: Dg Chest 2 View  09/19/2013   CLINICAL DATA:  Chest pain and congestion  EXAM: CHEST  2 VIEW  COMPARISON:  03/16/2013  FINDINGS: Cardiac shadow remains enlarged. Patchy infiltrate is noted throughout the mid and lower right lung no sizable effusion is noted. No acute bony abnormality is seen.  IMPRESSION: Patchy right lung infiltrate.    Electronically Signed   By: Alcide Clever M.D.   On: 09/19/2013 12:38   Ct Head Wo Contrast  09/19/2013   CLINICAL DATA:  Left-sided facial droop. Slurred speech. Hypertension. Hyperlipidemia.  EXAM: CT HEAD WITHOUT CONTRAST  TECHNIQUE: Contiguous axial images were obtained from the base of the skull through the vertex without intravenous contrast.  COMPARISON:  01/20/2013.  FINDINGS: No intracranial hemorrhage.  Prominent small vessel disease type changes without CT evidence of large acute infarct. Streak artifacts slightly limits evaluation of the posterior fossa structures.  Global atrophy without hydrocephalus.  No intracranial mass lesion noted on this unenhanced exam.  Vascular calcifications.  Dental disease.  IMPRESSION: No intracranial hemorrhage.  Prominent small vessel disease type changes without CT evidence of large acute infarct. Streak artifacts slightly limits evaluation of the posterior fossa structures.  Global atrophy without hydrocephalus.   Electronically Signed   By: Bridgett Larsson M.D.   On: 09/19/2013 12:54   Mr Maxine Glenn Head Wo Contrast  09/19/2013   CLINICAL DATA:  Left facial droop. Slurred speech. Stroke.  EXAM: MRI HEAD WITHOUT CONTRAST  MRA HEAD WITHOUT CONTRAST  TECHNIQUE: Multiplanar, multiecho pulse sequences of the brain and surrounding structures were obtained without intravenous contrast. Angiographic images of the head were obtained using MRA technique without contrast.  COMPARISON:  CT 09/19/2013. MRI 04/29/2013  FINDINGS: MRI HEAD FINDINGS  5 mm acute infarct right occipital lobe. Acute infarct right frontal parietal cortex and subcortical white matter. No other areas of acute infarction.  Generalized atrophy. Chronic microvascular ischemic change throughout the white matter bilaterally.  Negative for hemorrhage or mass. No midline shift. Paranasal sinuses are clear.  MRA HEAD FINDINGS  Both vertebral arteries are patent to the basilar. Right PICA is patent. Left PICA not  visualized. Left AICA is patent. Right AICA not visualized. Superior cerebellar and posterior cerebral arteries are patent bilaterally without significant stenosis.  Mild atherosclerotic disease in the left cavernous carotid which is patent without significant stenosis. Left anterior and middle cerebral arteries are patent.  Right cavernous carotid is widely patent. Right anterior and middle cerebral arteries are widely patent.  Negative for cerebral aneurysm.  IMPRESSION: MRI HEAD IMPRESSION  5 mm acute infarct right occipital lobe. Moderately large acute infarct right frontal parietal cortex and white matter.  Generalized atrophy with chronic microvascular ischemic change.  MRA HEAD IMPRESSION  No significant intracranial stenosis.   Electronically Signed   By: Marlan Palau M.D.   On: 09/19/2013 15:49   Mr Brain Wo Contrast  09/19/2013   CLINICAL DATA:  Left facial droop. Slurred speech. Stroke.  EXAM: MRI HEAD WITHOUT CONTRAST  MRA HEAD WITHOUT CONTRAST  TECHNIQUE: Multiplanar, multiecho pulse sequences of the brain and surrounding structures were obtained  without intravenous contrast. Angiographic images of the head were obtained using MRA technique without contrast.  COMPARISON:  CT 09/19/2013. MRI 04/29/2013  FINDINGS: MRI HEAD FINDINGS  5 mm acute infarct right occipital lobe. Acute infarct right frontal parietal cortex and subcortical white matter. No other areas of acute infarction.  Generalized atrophy. Chronic microvascular ischemic change throughout the white matter bilaterally.  Negative for hemorrhage or mass. No midline shift. Paranasal sinuses are clear.  MRA HEAD FINDINGS  Both vertebral arteries are patent to the basilar. Right PICA is patent. Left PICA not visualized. Left AICA is patent. Right AICA not visualized. Superior cerebellar and posterior cerebral arteries are patent bilaterally without significant stenosis.  Mild atherosclerotic disease in the left cavernous carotid which is  patent without significant stenosis. Left anterior and middle cerebral arteries are patent.  Right cavernous carotid is widely patent. Right anterior and middle cerebral arteries are widely patent.  Negative for cerebral aneurysm.  IMPRESSION: MRI HEAD IMPRESSION  5 mm acute infarct right occipital lobe. Moderately large acute infarct right frontal parietal cortex and white matter.  Generalized atrophy with chronic microvascular ischemic change.  MRA HEAD IMPRESSION  No significant intracranial stenosis.   Electronically Signed   By: Marlan Palau M.D.   On: 09/19/2013 15:49   Dg Swallowing Func-speech Pathology  09/20/2013   Leah Meryl McCoy, CCC-SLP     09/20/2013 11:09 AM Objective Swallowing Evaluation: Modified Barium Swallowing Study   Patient Details  Name: Matthew Mahoney MRN: [id number] Date of Birth: 1936/04/13  Today's Date: 09/20/2013 Time: 1035-1056 SLP Time Calculation (min): 21 min  Past Medical History:  Past Medical History  Diagnosis Date  . Hypertension   . Edema of leg   . COPD (chronic obstructive pulmonary disease)   . Choledocholithiasis   . Coronary arteriosclerosis   . Hyperlipidemia   . Ichthyosis   . Mitral regurgitation   . Onychomycosis   . Vitamin D deficiency   . Aortic stenosis   . Hyperkalemia   . Hypocalcemia   . Lymphadenopathy   . Prostate cancer   . Status post lung surgery 12/07/2001    BENIGHN LESION ON RIGHT LUNG REMOVED  . Ascites     HISTORY  . History of colonic diverticulitis   . Olecranon bursitis    Past Surgical History:  Past Surgical History  Procedure Laterality Date  . Hemorrhoid surgery  12/07/1998  . Lung surgery  12/07/2001    BENIGN LESION ON RIGHT LUNG REMOVED  . Colonoscopy    . Femur im nail Left 01/21/2013    Procedure: INTRAMEDULLARY (IM) NAIL FEMORAL;  Surgeon: Velna Ochs, MD;  Location: [facility]S;  Service: Orthopedics;   Laterality: Left;  . Esophagogastroduodenoscopy N/A 03/16/2013    Procedure: ESOPHAGOGASTRODUODENOSCOPY (EGD);  Surgeon: Florencia Reasons, MD;  Location: [facility];  Service: Endoscopy;   Laterality: N/A;  will probably want to do at bedside in  ICU---bed placement pending   HPI:  77 y.o. male with known stroke risk factors of HTN and  hyperlipidemia. Patient was LSN on Saturday and awoke on Sunday  having left facial droop and dysarthria. While in ED patient had  a MRI showing right parietal infarct. Pt also reported to have  difficutly eating.      Assessment / Plan / Recommendation Clinical Impression  Dysphagia Diagnosis: Moderate pharyngeal phase dysphagia;Severe  pharyngeal phase dysphagia;Moderate oral phase dysphagia;Severe  oral phase dysphagia Clinical impression: Patient presents with a moderate-severe  sensory  motor based oropharyngeal dysphagia marked by severe left  sided oral deficits resulting in decreased bolus cohesion,  anterior spillage, decreased posterior propulsion, and oral  residuals followed by a delay in swallow initiation which leads  to penetration of all consistencies tested. The use of a chin  tuck assisted to decrease residue and penetration episodes which  were flash with puree and honey thick liquids however patient  continued to penetrate thinner consistencies. SLP provided verbal  and visual cueing for this strategy as well as tight labial seal  which was minimally effective at decreasing anterior labial  spillage. Although aspiration risk remains moderately high,  patient very receptive to use of compensatory strategies and  aspiration precautions which were effective to decrease risks.  Recommend initiation of a conservative diet with strict use of  precautions and close SLP f/u.      Treatment Recommendation  Therapy as outlined in treatment plan below    Diet Recommendation Dysphagia 1 (Puree);Honey-thick liquid   Liquid Administration via: Spoon Medication Administration: Crushed with puree Supervision: Patient able to self feed;Full supervision/cueing  for compensatory strategies Compensations: Slow  rate;Small sips/bites;Check for anterior  loss;Check for pocketing;Multiple dry swallows after each  bite/sip;Effortful swallow Postural Changes and/or Swallow Maneuvers: Chin tuck;Seated  upright 90 degrees;Upright 30-60 min after meal    Other  Recommendations Recommended Consults: MBS Oral Care Recommendations: Oral care before and after PO Other Recommendations: Have oral suction available (SLp placed)   Follow Up Recommendations  Skilled Nursing facility    Frequency and Duration min 3x week  2 weeks       SLP Swallow Goals Patient will utilize recommended strategies during swallow to  increase swallowing safety with: Supervision/safety Swallow Study Goal #2 - Progress: Not met   General HPI: 77 y.o. male with known stroke risk factors of HTN  and hyperlipidemia. Patient was LSN on Saturday and awoke on  Sunday having left facial droop and dysarthria. While in ED  patient had a MRI showing right parietal infarct. Pt also  reported to have difficutly eating.  Type of Study: Modified Barium Swallowing Study Reason for Referral: Objectively evaluate swallowing function Previous Swallow Assessment: none Diet Prior to this Study: NPO Temperature Spikes Noted: No Respiratory Status: Nasal cannula History of Recent Intubation: No Behavior/Cognition: Alert;Cooperative;Pleasant mood Oral Cavity - Dentition: Dentures, top;Poor condition;Missing  dentition Oral Motor / Sensory Function: Impaired - see Bedside swallow  eval Self-Feeding Abilities: Able to feed self Patient Positioning: Upright in chair Baseline Vocal Quality: Wet Volitional Cough: Wet Volitional Swallow: Able to elicit Anatomy: Other (Comment) (? ostephytes along cervical spine; MD  not present to confirm) Pharyngeal Secretions: Not observed secondary MBS (however  suspect based on wet vocal quality)    Reason for Referral Objectively evaluate swallowing function   Oral Phase Oral Preparation/Oral Phase Oral Phase: Impaired Oral - Honey Oral - Honey  Teaspoon: Left anterior bolus loss;Weak lingual  manipulation;Lingual pumping;Reduced posterior propulsion;Left  pocketing in lateral sulci;Lingual/palatal residue;Delayed oral  transit Oral - Nectar Oral - Nectar Teaspoon: Left anterior bolus loss;Weak lingual  manipulation;Lingual pumping;Reduced posterior propulsion;Left  pocketing in lateral sulci;Lingual/palatal residue;Delayed oral  transit Oral - Solids Oral - Puree: Left anterior bolus loss;Weak lingual  manipulation;Lingual pumping;Reduced posterior propulsion;Left  pocketing in lateral sulci;Lingual/palatal residue;Delayed oral  transit   Pharyngeal Phase Pharyngeal Phase Pharyngeal Phase: Impaired Pharyngeal - Honey Pharyngeal - Honey Teaspoon: Delayed swallow initiation;Premature  spillage to valleculae;Reduced tongue base retraction;Pharyngeal  residue - valleculae;Penetration/Aspiration before  swallow;Compensatory strategies attempted (  Comment) Penetration/Aspiration details (honey teaspoon): Material enters  airway, remains ABOVE vocal cords then ejected out (flash  penetration decreases residue and penetration) Pharyngeal - Nectar Pharyngeal - Nectar Teaspoon: Delayed swallow  initiation;Premature spillage to pyriform sinuses;Reduced tongue  base retraction;Penetration/Aspiration during  swallow;Penetration/Aspiration after swallow;Pharyngeal residue -  valleculae;Compensatory strategies attempted (Comment) Penetration/Aspiration details (nectar teaspoon): Material enters  airway, CONTACTS cords and not ejected out (flash penetration  decreases residue ) Pharyngeal - Solids Pharyngeal - Puree: Delayed swallow initiation;Premature spillage  to valleculae;Reduced tongue base retraction;Pharyngeal residue -  valleculae;Penetration/Aspiration before swallow Penetration/Aspiration details (puree): Material enters airway,  remains ABOVE vocal cords then ejected out (flash penetration  decreases residue and penetration)  Cervical Esophageal Phase    GO    Ferdinand Lango MA, CCC-SLP [phone]  Cervical Esophageal Phase Cervical Esophageal Phase: [facility]         McCoy Leah Meryl 09/20/2013, 11:09 AM     Scheduled Meds: . atorvastatin  40 mg Oral Daily  . bicalutamide  50 mg Oral Daily  . cefTRIAXone (ROCEPHIN)  IV  1 g Intravenous Q24H  . clopidogrel  75 mg Oral Q breakfast  . doxycycline  100 mg Oral Q12H  . enoxaparin (LOVENOX) injection  40 mg Subcutaneous Q24H  . influenza vac split quadrivalent PF  0.5 mL Intramuscular Tomorrow-1000  . pantoprazole  40 mg Oral Daily   Continuous Infusions:   Active Problems:   Hypertension   CVA (cerebral infarction)   CAP (community acquired pneumonia)   CKD (chronic kidney disease), stage III    Time spent: >35 minutes    Penny Pia  [facility] Pager [phone]. If 7PM-7AM, please contact night-coverage at [email or web address], password [facility] 09/20/2013, 1:24 PM  LOS: 1 day

## 2013-09-20 NOTE — Progress Notes (Signed)
Echocardiogram 2D Echocardiogram has been performed.  09/20/2013 12:25 PM Gertie Fey, RVT, RDCS, RDMS

## 2013-09-20 NOTE — Evaluation (Signed)
Physical Therapy Evaluation Patient Details Name: NEIKO TRIVEDI MRN: [id number] DOB: 02-Sep-1936 Today's Date: 09/20/2013 Time: 0950-1010 PT Time Calculation (min): 20 min  PT Assessment / Plan / Recommendation History of Present Illness  77 yo male admitted with Lt side weakness and blurry vision. MRI (+) Rt occipital infarct and Rt frontal parietal infarct.  Clinical Impression  Patient demonstrates deficits in functional moblityas indicated below. Patien will benefit from continued skilled PT to address deficits and maximize function. Patient resides alone and has history of falls, given current functional status, recommend ST SNF prior to discharge home to ensure safety with mobility. Will continue to see as indicated and progress activity as tolerated.    PT Assessment  Patient needs continued PT services    Follow Up Recommendations  SNF       Barriers to Discharge Decreased caregiver support      Equipment Recommendations  None recommended by PT       Frequency Min 3X/week    Precautions / Restrictions Precautions Precautions: Fall Restrictions Weight Bearing Restrictions: No   Pertinent Vitals/Pain No pain reported at this time, some DOE reported, SpO2 on rm air 95%      Mobility  Bed Mobility Bed Mobility: Left Sidelying to Sit;Supine to Sit;Sitting - Scoot to Edge of Bed Left Sidelying to Sit: 4: Min guard;HOB elevated;With rails (HOB 53 degrees) Supine to Sit: 4: Min guard;With rails Sitting - Scoot to Edge of Bed: 4: Min guard Details for Bed Mobility Assistance: incr time and use of bed rail Transfers Transfers: Sit to Stand;Stand to Sit Sit to Stand: 3: Mod assist;With upper extremity assist;From bed Stand to Sit: 3: Mod assist;With upper extremity assist;To chair/3-in-1 Details for Transfer Assistance: pt relies on UE for support Ambulation/Gait Ambulation/Gait Assistance: 3: Mod assist Ambulation Distance (Feet): 16 Feet Assistive device: Straight  cane;Other (Comment) (usd cane on right and held furniture on left (bilat support)) Ambulation/Gait Assistance Details: Patient required increased assist for stability, diffculty maintaining balance causing him to rach out for bed and furiture during ambulation despite use of cane, will try RW next session Gait Pattern: Step-through pattern;Decreased stride length;Shuffle;Scissoring;Trunk flexed;Narrow base of support Gait velocity: decreased General Gait Details: very unsteady, occassional scissoring steps with minimal advance of stride        PT Diagnosis: Difficulty walking;Abnormality of gait;Generalized weakness  PT Problem List: Decreased strength;Decreased activity tolerance;Decreased balance;Decreased mobility;Decreased coordination;Decreased knowledge of use of DME PT Treatment Interventions: DME instruction;Gait training;Stair training;Functional mobility training;Therapeutic activities;Therapeutic exercise;Balance training;Patient/family education     PT Goals(Current goals can be found in the care plan section) Acute Rehab PT Goals Patient Stated Goal: none specifically stated  Visit Information  Last PT Received On: 09/20/13 Assistance Needed: +1 History of Present Illness: 77 yo male admitted with Lt side weakness and blurry vision. MRI (+) Rt occipital infarct and Rt frontal parietal infarct.       Prior Functioning  Home Living Family/patient expects to be discharged to:: Private residence Living Arrangements: Alone Available Help at Discharge: Family;Available PRN/intermittently (sister) Type of Home: Apartment Home Access: Level entry Home Layout: One level Home Equipment: Bedside commode;Cane - single point;Walker - 4 wheels  Lives With: Alone Prior Function Level of Independence: Needs assistance ADL's / Homemaking Assistance Needed: sister does house keeping, does [employer] / Swallowing Assistance Needed: has full top denture that is loose at  this time affecting speech in addition to new facail left side weakness Comments: drives Communication Communication: Expressive difficulties (slurred  speech ) Dominant Hand: Right    Cognition  Cognition Arousal/Alertness: Awake/alert Behavior During Therapy: WFL for tasks assessed/performed Overall Cognitive Status: Impaired/Different from baseline    Extremity/Trunk Assessment Upper Extremity Assessment Upper Extremity Assessment: Overall WFL for tasks assessed Lower Extremity Assessment Lower Extremity Assessment: Generalized weakness Cervical / Trunk Assessment Cervical / Trunk Assessment: Kyphotic   Balance Balance Balance Assessed: Yes Static Sitting Balance Static Sitting - Balance Support: Feet supported;No upper extremity supported Static Sitting - Level of Assistance: 6: Modified independent (Device/Increase time) Static Sitting - Comment/# of Minutes: ~3 minutes. able to shift weight to remove gown from under hip. Pt able to lean posteriorly to don socks Static Standing Balance Static Standing - Balance Support: Right upper extremity supported;During functional activity Static Standing - Level of Assistance: 3: Mod assist  End of Session PT - End of Session Equipment Utilized During Treatment: Gait belt;Oxygen Activity Tolerance: Patient tolerated treatment well Patient left: in chair;with call bell/phone within reach Nurse Communication: Mobility status  GP     Fabio Asa 09/20/2013, 10:35 AM Charlotte Crumb, PT DPT  [phone]

## 2013-09-20 NOTE — Progress Notes (Signed)
Stroke Team Progress Note  HISTORY Matthew Mahoney is an 77 y.o. male who was last seen normal on Saturday 09/16/13. He awoke on Sunday and noted he may have had a facial droop on the left. Later that day he was eating stew and noted he had a hard time keeping it in his mouth. He was brought to ED 10/14 for further evaluation. MRI brain was obtained and is positive for right parietal infarct. There is question of possible Afib en route with EMS. 12 lead obtained in hospital showed NSR. Currently patient is hooked up to telemetry and showing NSR.  Patient was not a TPA candidate secondary to delay in arrival. He was admitted for further evaluation and treatment.  SUBJECTIVE He is currently in hallway in radiology in the bed awaiting transport back to his room.   Overall he feels his condition is stable.   OBJECTIVE Most recent Vital Signs: Filed Vitals:   09/19/13 2351 09/20/13 0100 09/20/13 0330 09/20/13 0530  BP: 157/94 155/89 139/73 138/74  Pulse: 97 75 78 82  Temp:  97.5 F (36.4 C) 97.8 F (36.6 C) 97.7 F (36.5 C)  TempSrc: Oral Other (Comment) Oral Oral  Resp: 18 18 18 18   Height:      Weight:      SpO2: 96% 97% 98% 98%   CBG (last 3)   Recent Labs  09/19/13 1205  GLUCAP 80    IV Fluid Intake:     MEDICATIONS  . atorvastatin  40 mg Oral Daily  . bicalutamide  50 mg Oral Daily  . cefTRIAXone (ROCEPHIN)  IV  1 g Intravenous Q24H  . clopidogrel  75 mg Oral Q breakfast  . doxycycline  100 mg Oral Q12H  . enoxaparin (LOVENOX) injection  40 mg Subcutaneous Q24H  . influenza vac split quadrivalent PF  0.5 mL Intramuscular Tomorrow-1000  . pantoprazole  40 mg Oral Daily   PRN:  senna-docusate  Diet:  NPO  Activity:  Bedrest DVT Prophylaxis:  Lovenox 40 mg sq daily   CLINICALLY SIGNIFICANT STUDIES Basic Metabolic Panel:   Recent Labs Lab 09/19/13 1230 09/19/13 1935  NA 138  140  --   K 4.1  3.9  --   CL 98  102  --   CO2 22  --   GLUCOSE 88  88  --   BUN  25*  27*  --   CREATININE 1.63*  2.00* 1.64*  CALCIUM 10.0  --    Liver Function Tests:   Recent Labs Lab 09/19/13 1230  AST 20  ALT 22  ALKPHOS 95  BILITOT 1.2  PROT 7.7  ALBUMIN 4.5   CBC:   Recent Labs Lab 09/19/13 1230 09/19/13 1935  WBC 7.2 7.8  NEUTROABS 4.7  --   HGB 14.7  16.0 12.7*  HCT 43.4  47.0 37.4*  MCV 88.9 89.3  PLT 116* 99*   Coagulation:   Recent Labs Lab 09/19/13 1230  LABPROT 13.1  INR 1.01   Cardiac Enzymes:   Recent Labs Lab 09/19/13 1230  TROPONINI <0.30   Urinalysis:   Recent Labs Lab 09/19/13 1331  COLORURINE YELLOW  LABSPEC 1.016  PHURINE 5.0  GLUCOSEU NEGATIVE  HGBUR NEGATIVE  BILIRUBINUR SMALL*  KETONESUR 15*  PROTEINUR 30*  UROBILINOGEN 1.0  NITRITE NEGATIVE  LEUKOCYTESUR NEGATIVE   Lipid Panel    Component Value Date/Time   CHOL 120 09/20/2013 0500   TRIG 68 09/20/2013 0500   HDL 39* 09/20/2013 0500  CHOLHDL 3.1 09/20/2013 0500   VLDL 14 09/20/2013 0500   LDLCALC 67 09/20/2013 0500   HgbA1C  Lab Results  Component Value Date   HGBA1C 5.2 09/19/2013    Urine Drug Screen:     Component Value Date/Time   LABOPIA NONE DETECTED 09/19/2013 1331   COCAINSCRNUR NONE DETECTED 09/19/2013 1331   LABBENZ NONE DETECTED 09/19/2013 1331   AMPHETMU NONE DETECTED 09/19/2013 1331   THCU NONE DETECTED 09/19/2013 1331   LABBARB NONE DETECTED 09/19/2013 1331    Alcohol Level:   Recent Labs Lab 09/19/13 1230  ETH <11    CT of the brain  09/19/2013    No intracranial hemorrhage.  Prominent small vessel disease type changes without CT evidence of large acute infarct. Streak artifacts slightly limits evaluation of the posterior fossa structures.  Global atrophy without hydrocephalus.     MRI of the brain  09/19/2013    5 mm acute infarct right occipital lobe. Moderately large acute infarct right frontal parietal cortex and white matter.  Generalized atrophy with chronic microvascular ischemic change.    MRA of  the brain  09/19/2013    No significant intracranial stenosis.  2D Echocardiogram    Carotid Doppler    CXR  09/19/2013   Patchy right lung infiltrate.    EKG  normal sinus rhythm, baseline wander (see ECG for full results).   Therapy Recommendations   Physical Exam   Pleasant elderly Caucasian male currently not in distress.Awake alert. Afebrile. Head is nontraumatic. Neck is supple without bruit. Hearing is normal. Cardiac exam no murmur or gallop. Lungs are clear to auscultation. Distal pulses are well felt. Neurological Exam :  Awake alert oriented x3 with no aphasia. Minimum dysarthria. Extraocular movements are full range without nystagmus. Blinks to threat bilaterally. Tongue deviates to the left. Moderate left lower facial weakness. No upper or lower extremity drift but fine finger movements are diminished on the left left grip is weak. Mild left hip flexor weakness. Orbits right over left upper extremity. Sensation appears preserved. Coordination is intact. Gait was not tested. ASSESSMENT [name] is a 77 y.o. male presenting with left facial droop. Imaging confirms a right occipital and right frontal parietal cortical/white matter infarcts. Given multiple vascular distributions of the current strokes, infarcts felt to be  embolic secondary to presumed afib (noted en route with EMS). ECG here without afib. Discussed scenario with Dr. Delton See, oncall for [facility] cardiology today (pt has seen Dr. Elease Hashimoto in the past; no documented afib in his last 2 office notes). Patient was on no antithrombotics prior to admission. Now on clopidogrel 75 mg orally every day for secondary stroke prevention. Work up underway.  Hypertension CPOD CAD Hyperlipidemia, LDL 67, on lipitor 40 PTA, now on lipitor 40, goal LDL < 100 (< 70 for diabetics) aoritc stenosis  Renal insufficiency, Cr 1.63-2.0  Hospital day # 1  TREATMENT/PLAN  Dr. Delton See with [facility] cardiology recommends anticoagulation  in the setting with OP monitoring to confirm - can consult EP directly for loop placement or other monitoring as indicated. No indication for TEE from her standpoint. Dr. Pearlean Brownie is agreeable. Spoke with  NP for EP - she is going to review EMS/pt records.  OOB. Therapy evals.  Continue clopidogrel 75 mg orally every day for now for secondary stroke prevention. Based on chart review, consider changing to anticoagulation tomorrow; consider kidney function when selecting anticoagulant.  F/u 2D and carotid doppler  SHARON BIBY, MSN, RN, ANVP-BC, ANP-BC, GNP-BC  [facility] Pager: [phone] 09/20/2013 11:16 AM  I have personally obtained a history, examined the patient, evaluated imaging results, and formulated the assessment and plan of care. I agree with the above. Delia Heady, MD

## 2013-09-20 NOTE — Progress Notes (Signed)
   CARE MANAGEMENT NOTE 09/20/2013  Patient:  Matthew Mahoney, Matthew Mahoney   Account Number:  [id number]  Date Initiated:  09/20/2013  Documentation initiated by:  Jiles Crocker  Subjective/Objective Assessment:   ADMITTED FOR CVA     Action/Plan:   LIVES AT HOME ALONE  PCP IS DR CAMILLE ANDY  CM FOLLOWING FOR DCP   Anticipated DC Date:  09/25/2013   Anticipated DC Plan:  SKILLED NURSING FACILITY  In-house referral  Clinical Social Worker      DC Planning Services  CM consult          Status of service:  In process, will continue to follow Medicare Important Message given?  NA - LOS <3 / Initial given by admissions (If response is "NO", the following Medicare IM given date fields will be blank)  Per UR Regulation:  Reviewed for med. necessity/level of care/duration of stay  Comments:  10/15/2014Abelino Derrick RN,BSN,MHA [phone]

## 2013-09-21 DIAGNOSIS — I6350 Cerebral infarction due to unspecified occlusion or stenosis of unspecified cerebral artery: Secondary | ICD-10-CM

## 2013-09-21 DIAGNOSIS — I429 Cardiomyopathy, unspecified: Secondary | ICD-10-CM

## 2013-09-21 DIAGNOSIS — J449 Chronic obstructive pulmonary disease, unspecified: Secondary | ICD-10-CM

## 2013-09-21 DIAGNOSIS — I519 Heart disease, unspecified: Secondary | ICD-10-CM

## 2013-09-21 DIAGNOSIS — I4892 Unspecified atrial flutter: Secondary | ICD-10-CM

## 2013-09-21 MED ORDER — IRBESARTAN 300 MG PO TABS
300.0000 mg | ORAL_TABLET | Freq: Every day | ORAL | Status: DC
  Administered 2013-09-21 – 2013-09-26 (×5): 300 mg via ORAL
  Filled 2013-09-21 (×6): qty 1

## 2013-09-21 NOTE — Progress Notes (Signed)
Stroke Team Progress Note  HISTORY Matthew Mahoney is an 77 y.o. male who was last seen normal on Saturday 09/16/13. He awoke on Sunday and noted he may have had a facial droop on the left. Later that day he was eating stew and noted he had a hard time keeping it in his mouth. He was brought to ED 10/14 for further evaluation. MRI brain was obtained and is positive for right parietal infarct. There is question of possible Afib en route with EMS. 12 lead obtained in hospital showed NSR. Currently patient is hooked up to telemetry and showing NSR.  Patient was not a TPA candidate secondary to delay in arrival. He was admitted for further evaluation and treatment.  SUBJECTIVE Patient lying in bed.  OBJECTIVE Most recent Vital Signs: Filed Vitals:   09/21/13 0332 09/21/13 0640 09/21/13 0658 09/21/13 1111  BP: 139/77 169/119 190/100 159/113  Pulse: 71 74  69  Temp: 98.2 F (36.8 C) 98.1 F (36.7 C)  98.1 F (36.7 C)  TempSrc: Oral Oral  Oral  Resp: 18 18  18   Height:      Weight:      SpO2: 100% 100%  100%   CBG (last 3)   Recent Labs  09/19/13 1205  GLUCAP 80    IV Fluid Intake:     MEDICATIONS  . atorvastatin  40 mg Oral Daily  . bicalutamide  50 mg Oral Daily  . cefTRIAXone (ROCEPHIN)  IV  1 g Intravenous Q24H  . clopidogrel  75 mg Oral Q breakfast  . doxycycline  100 mg Oral Q12H  . enoxaparin (LOVENOX) injection  40 mg Subcutaneous Q24H  . pantoprazole  40 mg Oral Daily   PRN:  food thickener, senna-docusate  Diet:  Dysphagia 1 honey thick Activity:  As tolerated DVT Prophylaxis:  Lovenox 40 mg sq daily   CLINICALLY SIGNIFICANT STUDIES Basic Metabolic Panel:   Recent Labs Lab 09/19/13 1230 09/19/13 1935  NA 138  140  --   K 4.1  3.9  --   CL 98  102  --   CO2 22  --   GLUCOSE 88  88  --   BUN 25*  27*  --   CREATININE 1.63*  2.00* 1.64*  CALCIUM 10.0  --    Liver Function Tests:   Recent Labs Lab 09/19/13 1230  AST 20  ALT 22  ALKPHOS 95   BILITOT 1.2  PROT 7.7  ALBUMIN 4.5   CBC:   Recent Labs Lab 09/19/13 1230 09/19/13 1935  WBC 7.2 7.8  NEUTROABS 4.7  --   HGB 14.7  16.0 12.7*  HCT 43.4  47.0 37.4*  MCV 88.9 89.3  PLT 116* 99*   Coagulation:   Recent Labs Lab 09/19/13 1230  LABPROT 13.1  INR 1.01   Cardiac Enzymes:   Recent Labs Lab 09/19/13 1230  TROPONINI <0.30   Urinalysis:   Recent Labs Lab 09/19/13 1331  COLORURINE YELLOW  LABSPEC 1.016  PHURINE 5.0  GLUCOSEU NEGATIVE  HGBUR NEGATIVE  BILIRUBINUR SMALL*  KETONESUR 15*  PROTEINUR 30*  UROBILINOGEN 1.0  NITRITE NEGATIVE  LEUKOCYTESUR NEGATIVE   Lipid Panel    Component Value Date/Time   CHOL 120 09/20/2013 0500   TRIG 68 09/20/2013 0500   HDL 39* 09/20/2013 0500   CHOLHDL 3.1 09/20/2013 0500   VLDL 14 09/20/2013 0500   LDLCALC 67 09/20/2013 0500   HgbA1C  Lab Results  Component Value Date   HGBA1C 5.0  09/20/2013    Urine Drug Screen:     Component Value Date/Time   LABOPIA NONE DETECTED 09/19/2013 1331   COCAINSCRNUR NONE DETECTED 09/19/2013 1331   LABBENZ NONE DETECTED 09/19/2013 1331   AMPHETMU NONE DETECTED 09/19/2013 1331   THCU NONE DETECTED 09/19/2013 1331   LABBARB NONE DETECTED 09/19/2013 1331    Alcohol Level:   Recent Labs Lab 09/19/13 1230  ETH <11    CT of the brain  09/19/2013    No intracranial hemorrhage.  Prominent small vessel disease type changes without CT evidence of large acute infarct. Streak artifacts slightly limits evaluation of the posterior fossa structures.  Global atrophy without hydrocephalus.     MRI of the brain  09/19/2013    5 mm acute infarct right occipital lobe. Moderately large acute infarct right frontal parietal cortex and white matter.  Generalized atrophy with chronic microvascular ischemic change.    MRA of the brain  09/19/2013    No significant intracranial stenosis.  2D Echocardiogram  EF 20-25%. Diffuse hypokinesis. Moderate diastolic dysfunction with  evidence for elevated LV filling pressure. Normal RV size with mildly decreased systolic function. Mild MR.  Carotid Doppler  No evidence of hemodynamically significant internal carotid artery stenosis. Vertebral artery flow is antegrade.   CXR  09/19/2013   Patchy right lung infiltrate.    EKG  normal sinus rhythm, baseline wander (see ECG for full results).   Therapy Recommendations SNF  Physical Exam   Pleasant elderly Caucasian male currently not in distress.Awake alert. Afebrile. Head is nontraumatic. Neck is supple without bruit. Hearing is normal. Cardiac exam no murmur or gallop. Lungs are clear to auscultation. Distal pulses are well felt. Neurological Exam :  Awake alert oriented x3 with no aphasia. Minimum dysarthria. Extraocular movements are full range without nystagmus. Blinks to threat bilaterally. Tongue deviates to the left. Moderate left lower facial weakness. No upper or lower extremity drift but fine finger movements are diminished on the left left grip is weak. Mild left hip flexor weakness. Orbits right over left upper extremity. Sensation appears preserved. Coordination is intact. Gait was not tested.  ASSESSMENT [name] is a 77 y.o. male presenting with left facial droop. Imaging confirms a right occipital and right frontal parietal cortical/white matter infarcts. Given multiple vascular distributions of the current strokes, infarcts initially felt to be  embolic secondary to presumed afib - though after review of EMS records atrial fib was not recognized. ECG here without afib. Infarct embolic due to unknown source pt. Patient was on no antithrombotics prior to admission. Now on clopidogrel 75 mg orally every day for secondary stroke prevention. Work up underway.  Hypertension COPD CAD Hyperlipidemia, LDL 67, on lipitor 40 PTA, now on lipitor 40, goal LDL < 100 (< 70 for diabetics) aoritc stenosis  Renal insufficiency, Cr 1.63-2.0  Hospital day #  2  TREATMENT/PLAN  Continue clopidogrel 75 mg orally every day for now for secondary stroke prevention. TEE to look for embolic source. Arranged with [facility] Cardiology for tomorrow.  If positive for PFO (patent foramen ovale), check bilateral lower extremity venous dopplers to rule out DVT as possible source of stroke. i have made npo. If TEE negative, [place] cardiologist will place implantable loop recorder to evaluate for atrial fibrillation as etiology of stroke. This has been explained to patient by Dr. Pearlean Brownie and he is agreeable. SNF vs CIR - PT recommends SNF as he loops unkempt, has had frequent falls at home and is concerned  about his safety. Unsure if patient is agreeable to this plan.  Dr. Pearlean Brownie discussed diagnosis, prognosis,  treatment options and plan of care with pt and Dr. Cena Benton.    Annie Main, MSN, RN, ANVP-BC, ANP-BC, [facility] Pager: [phone] 09/21/2013 11:15 AM  I have personally obtained a history, examined the patient, evaluated imaging results, and formulated the assessment and plan of care. I agree with the above.  Delia Heady, MD

## 2013-09-21 NOTE — Progress Notes (Signed)
[facility] PROGRESS NOTE  Matthew Mahoney MRN:[id number] DOB: 06/27/1936 DOA: 09/19/2013 PCP: ANDY,CAMILLE L, MD  Assessment/Plan: 1. CVA - Plavix 75 mg started per neurology - MRI:5 mm acute infarct right occipital lobe. Acute infarct right frontal  parietal cortex and subcortical white matter. - ECHO: Mildly dilated LV with severe diffuse hypokinesis, EF 20-25%. Moderate diastolic dysfunction with evidence for elevated LV filling pressure. Normal RV size with mildly decreased systolic function. Mild MR. -Carotid dopplers:Bilateral: 1-39% ICA stenosis. Vertebral artery flow is antegrade - Speech therapy recommends dysphagia with honey thicken  -PT/OT recommends SNF  - TEE to be done with [facility] tomorrow 09/22/13 to look for embolic source.  Per neurology If positive for patent foramen oval ,  bilateral lower extremity venous dopplers to be done to rule out DVT as possible source of stroke. -If TEE negative, [place] cardiologist will place implantable loop recorder to evaluate for atrial fibrillation as etiology of stroke.  - NPO for TEE tomorrow    2. HTN Range sbp 139-190/ dbp 70-119 for 09/21/2013 Will continue to monitor - Add patient's ARB   3. Community acquired pneumonia -CT:Patchy infiltrate is noted throughout the mid and lower right lung no sizable effusion is noted. -Afebrile - on rocephin and doxycycline (day 2) due to prolonged QT interval  - Given that patient has had trouble with swallowing and waited 3 days prior to presentation suspect patient most likely had aspiration pneumonia.  4. CKD Stage III -Creatinine 1.64 (09/20/13) which is patient's baseline -BMP in am    Code Status: Full  Family Communication: POC discussed with patient  Disposition Plan: pending neurology evaluation and  recommendations   Consultants:  Neurology  Procedures:  none  Antibiotics:  Rocephin started  09/19/2013  Doxycycline started  09/19/2013  HPI/Subjective: 77 year old male with history of HTN, Aortic stenosis, COPD, CKD stage III who presented to ED with slurred speech and left sided facial droop for three days starting 09/16/2013.  MRI revealed 5 MM acute infract in right occipital lobe.    Pt with no new complaints this am. Pt with continued left sided mouth droop and moderate drooling. Pt able to move all extremities, but reports left leg is weak due to prior hip injury. Patient currently denies headache, SOB, chest pain, and numbness and tingling.   Objective: Filed Vitals:   09/21/13 1111  BP: 159/113  Pulse: 69  Temp: 98.1 F (36.7 C)  Resp: 18    Intake/Output Summary (Last 24 hours) at 09/21/13 1301 Last data filed at 09/21/13 1256  Gross per 24 hour  Intake     60 ml  Output    800 ml  Net   -740 ml   Filed Weights   09/19/13 1535  Weight: 83.598 kg (184 lb 4.8 oz)    Exam:   General: Pt in bed in NAD  Cardiovascular: S1 and S2  Present with no MRG. Radial and pedal pulses 2+ with no cyanosis or edema  Respiratory: regular unlabored breathing. Lungs CTA with no wheeze or rhonchi  Abdomen: symmetric soft, ND, NT . BS X4   Musculoskeletal: moves all extremities .   Neurological: left mouth droop with drooling. Tongue deviated to left. Normal finger to nose and heel to shin   Data Reviewed: Basic Metabolic Panel:  Recent Labs Lab 09/19/13 1230 09/19/13 1935  NA 138  140  --   K 4.1  3.9  --   CL 98  102  --     CO2 22  --   GLUCOSE 88  88  --   BUN 25*  27*  --   CREATININE 1.63*  2.00* 1.64*  CALCIUM 10.0  --    Liver Function Tests:  Recent Labs Lab 09/19/13 1230  AST 20  ALT 22  ALKPHOS 95  BILITOT 1.2  PROT 7.7  ALBUMIN 4.5   No results found for this basename: LIPASE, AMYLASE,  in the last 168 hours No results found for this basename: AMMONIA,  in the last 168 hours CBC:  Recent Labs Lab 09/19/13 1230 09/19/13 1935  WBC 7.2 7.8  NEUTROABS 4.7  --    HGB 14.7  16.0 12.7*  HCT 43.4  47.0 37.4*  MCV 88.9 89.3  PLT 116* 99*   Cardiac Enzymes:  Recent Labs Lab 09/19/13 1230  TROPONINI <0.30   BNP (last 3 results) No results found for this basename: PROBNP,  in the last 8760 hours CBG:  Recent Labs Lab 09/19/13 1205  GLUCAP 80    No results found for this or any previous visit (from the past 240 hour(s)).   Studies: Mr Mra Head Wo Contrast  09/19/2013   CLINICAL DATA:  Left facial droop. Slurred speech. Stroke.  EXAM: MRI HEAD WITHOUT CONTRAST  MRA HEAD WITHOUT CONTRAST  TECHNIQUE: Multiplanar, multiecho pulse sequences of the brain and surrounding structures were obtained without intravenous contrast. Angiographic images of the head were obtained using MRA technique without contrast.  COMPARISON:  CT 09/19/2013. MRI 04/29/2013  FINDINGS: MRI HEAD FINDINGS  5 mm acute infarct right occipital lobe. Acute infarct right frontal parietal cortex and subcortical white matter. No other areas of acute infarction.  Generalized atrophy. Chronic microvascular ischemic change throughout the white matter bilaterally.  Negative for hemorrhage or mass. No midline shift. Paranasal sinuses are clear.  MRA HEAD FINDINGS  Both vertebral arteries are patent to the basilar. Right PICA is patent. Left PICA not visualized. Left AICA is patent. Right AICA not visualized. Superior cerebellar and posterior cerebral arteries are patent bilaterally without significant stenosis.  Mild atherosclerotic disease in the left cavernous carotid which is patent without significant stenosis. Left anterior and middle cerebral arteries are patent.  Right cavernous carotid is widely patent. Right anterior and middle cerebral arteries are widely patent.  Negative for cerebral aneurysm.  IMPRESSION: MRI HEAD IMPRESSION  5 mm acute infarct right occipital lobe. Moderately large acute infarct right frontal parietal cortex and white matter.  Generalized atrophy with chronic  microvascular ischemic change.  MRA HEAD IMPRESSION  No significant intracranial stenosis.   Electronically Signed   By: Charles  Clark M.D.   On: 09/19/2013 15:49   Mr Brain Wo Contrast  09/19/2013   CLINICAL DATA:  Left facial droop. Slurred speech. Stroke.  EXAM: MRI HEAD WITHOUT CONTRAST  MRA HEAD WITHOUT CONTRAST  TECHNIQUE: Multiplanar, multiecho pulse sequences of the brain and surrounding structures were obtained without intravenous contrast. Angiographic images of the head were obtained using MRA technique without contrast.  COMPARISON:  CT 09/19/2013. MRI 04/29/2013  FINDINGS: MRI HEAD FINDINGS  5 mm acute infarct right occipital lobe. Acute infarct right frontal parietal cortex and subcortical white matter. No other areas of acute infarction.  Generalized atrophy. Chronic microvascular ischemic change throughout the white matter bilaterally.  Negative for hemorrhage or mass. No midline shift. Paranasal sinuses are clear.  MRA HEAD FINDINGS  Both vertebral arteries are patent to the basilar. Right PICA is patent. Left PICA not visualized. Left AICA is patent.   Right AICA not visualized. Superior cerebellar and posterior cerebral arteries are patent bilaterally without significant stenosis.  Mild atherosclerotic disease in the left cavernous carotid which is patent without significant stenosis. Left anterior and middle cerebral arteries are patent.  Right cavernous carotid is widely patent. Right anterior and middle cerebral arteries are widely patent.  Negative for cerebral aneurysm.  IMPRESSION: MRI HEAD IMPRESSION  5 mm acute infarct right occipital lobe. Moderately large acute infarct right frontal parietal cortex and white matter.  Generalized atrophy with chronic microvascular ischemic change.  MRA HEAD IMPRESSION  No significant intracranial stenosis.   Electronically Signed   By: Charles  Clark M.D.   On: 09/19/2013 15:49   Dg Swallowing Func-speech Pathology  09/20/2013   Leah Meryl McCoy,  CCC-SLP     09/20/2013 11:09 AM Objective Swallowing Evaluation: Modified Barium Swallowing Study   Patient Details  Name: Matthew Mahoney MRN: [id number] Date of Birth: 08/21/1936  Today's Date: 09/20/2013 Time: 1035-1056 SLP Time Calculation (min): 21 min  Past Medical History:  Past Medical History  Diagnosis Date  . Hypertension   . Edema of leg   . COPD (chronic obstructive pulmonary disease)   . Choledocholithiasis   . Coronary arteriosclerosis   . Hyperlipidemia   . Ichthyosis   . Mitral regurgitation   . Onychomycosis   . Vitamin D deficiency   . Aortic stenosis   . Hyperkalemia   . Hypocalcemia   . Lymphadenopathy   . Prostate cancer   . Status post lung surgery 12/07/2001    BENIGHN LESION ON RIGHT LUNG REMOVED  . Ascites     HISTORY  . History of colonic diverticulitis   . Olecranon bursitis    Past Surgical History:  Past Surgical History  Procedure Laterality Date  . Hemorrhoid surgery  12/07/1998  . Lung surgery  12/07/2001    BENIGN LESION ON RIGHT LUNG REMOVED  . Colonoscopy    . Femur im nail Left 01/21/2013    Procedure: INTRAMEDULLARY (IM) NAIL FEMORAL;  Surgeon: Peter G  Dalldorf, MD;  Location: [facility];  Service: Orthopedics;   Laterality: Left;  . Esophagogastroduodenoscopy N/A 03/16/2013    Procedure: ESOPHAGOGASTRODUODENOSCOPY (EGD);  Surgeon: Armondo V  Buccini, MD;  Location: [facility];  Service: Endoscopy;   Laterality: N/A;  will probably want to do at bedside in  ICU---bed placement pending   HPI:  77 y.o. male with known stroke risk factors of HTN and  hyperlipidemia. Patient was LSN on Saturday and awoke on Sunday  having left facial droop and dysarthria. While in ED patient had  a MRI showing right parietal infarct. Pt also reported to have  difficutly eating.      Assessment / Plan / Recommendation Clinical Impression  Dysphagia Diagnosis: Moderate pharyngeal phase dysphagia;Severe  pharyngeal phase dysphagia;Moderate oral phase dysphagia;Severe  oral phase dysphagia Clinical impression:  Patient presents with a moderate-severe  sensory motor based oropharyngeal dysphagia marked by severe left  sided oral deficits resulting in decreased bolus cohesion,  anterior spillage, decreased posterior propulsion, and oral  residuals followed by a delay in swallow initiation which leads  to penetration of all consistencies tested. The use of a chin  tuck assisted to decrease residue and penetration episodes which  were flash with puree and honey thick liquids however patient  continued to penetrate thinner consistencies. SLP provided verbal  and visual cueing for this strategy as well as tight labial seal  which was minimally effective at decreasing anterior labial    spillage. Although aspiration risk remains moderately high,  patient very receptive to use of compensatory strategies and  aspiration precautions which were effective to decrease risks.  Recommend initiation of a conservative diet with strict use of  precautions and close SLP f/u.      Treatment Recommendation  Therapy as outlined in treatment plan below    Diet Recommendation Dysphagia 1 (Puree);Honey-thick liquid   Liquid Administration via: Spoon Medication Administration: Crushed with puree Supervision: Patient able to self feed;Full supervision/cueing  for compensatory strategies Compensations: Slow rate;Small sips/bites;Check for anterior  loss;Check for pocketing;Multiple dry swallows after each  bite/sip;Effortful swallow Postural Changes and/or Swallow Maneuvers: Chin tuck;Seated  upright 90 degrees;Upright 30-60 min after meal    Other  Recommendations Recommended Consults: MBS Oral Care Recommendations: Oral care before and after PO Other Recommendations: Have oral suction available (SLp placed)   Follow Up Recommendations  Skilled Nursing facility    Frequency and Duration min 3x week  2 weeks       SLP Swallow Goals Patient will utilize recommended strategies during swallow to  increase swallowing safety with: Supervision/safety Swallow  Study Goal #2 - Progress: Not met   General HPI: 77 y.o. male with known stroke risk factors of HTN  and hyperlipidemia. Patient was LSN on Saturday and awoke on  Sunday having left facial droop and dysarthria. While in ED  patient had a MRI showing right parietal infarct. Pt also  reported to have difficutly eating.  Type of Study: Modified Barium Swallowing Study Reason for Referral: Objectively evaluate swallowing function Previous Swallow Assessment: none Diet Prior to this Study: NPO Temperature Spikes Noted: No Respiratory Status: Nasal cannula History of Recent Intubation: No Behavior/Cognition: Alert;Cooperative;Pleasant mood Oral Cavity - Dentition: Dentures, top;Poor condition;Missing  dentition Oral Motor / Sensory Function: Impaired - see Bedside swallow  eval Self-Feeding Abilities: Able to feed self Patient Positioning: Upright in chair Baseline Vocal Quality: Wet Volitional Cough: Wet Volitional Swallow: Able to elicit Anatomy: Other (Comment) (? ostephytes along cervical spine; MD  not present to confirm) Pharyngeal Secretions: Not observed secondary MBS (however  suspect based on wet vocal quality)    Reason for Referral Objectively evaluate swallowing function   Oral Phase Oral Preparation/Oral Phase Oral Phase: Impaired Oral - Honey Oral - Honey Teaspoon: Left anterior bolus loss;Weak lingual  manipulation;Lingual pumping;Reduced posterior propulsion;Left  pocketing in lateral sulci;Lingual/palatal residue;Delayed oral  transit Oral - Nectar Oral - Nectar Teaspoon: Left anterior bolus loss;Weak lingual  manipulation;Lingual pumping;Reduced posterior propulsion;Left  pocketing in lateral sulci;Lingual/palatal residue;Delayed oral  transit Oral - Solids Oral - Puree: Left anterior bolus loss;Weak lingual  manipulation;Lingual pumping;Reduced posterior propulsion;Left  pocketing in lateral sulci;Lingual/palatal residue;Delayed oral  transit   Pharyngeal Phase Pharyngeal Phase Pharyngeal Phase:  Impaired Pharyngeal - Honey Pharyngeal - Honey Teaspoon: Delayed swallow initiation;Premature  spillage to valleculae;Reduced tongue base retraction;Pharyngeal  residue - valleculae;Penetration/Aspiration before  swallow;Compensatory strategies attempted (Comment) Penetration/Aspiration details (honey teaspoon): Material enters  airway, remains ABOVE vocal cords then ejected out (flash  penetration decreases residue and penetration) Pharyngeal - Nectar Pharyngeal - Nectar Teaspoon: Delayed swallow  initiation;Premature spillage to pyriform sinuses;Reduced tongue  base retraction;Penetration/Aspiration during  swallow;Penetration/Aspiration after swallow;Pharyngeal residue -  valleculae;Compensatory strategies attempted (Comment) Penetration/Aspiration details (nectar teaspoon): Material enters  airway, CONTACTS cords and not ejected out (flash penetration  decreases residue ) Pharyngeal - Solids Pharyngeal - Puree: Delayed swallow initiation;Premature spillage  to valleculae;Reduced tongue base retraction;Pharyngeal residue -  valleculae;Penetration/Aspiration before swallow Penetration/Aspiration details (puree): Material enters   airway,  remains ABOVE vocal cords then ejected out (flash penetration  decreases residue and penetration)  Cervical Esophageal Phase    GO   Leah McCoy MA, CCC-SLP [phone]  Cervical Esophageal Phase Cervical Esophageal Phase: WFL         McCoy Leah Meryl 09/20/2013, 11:09 AM     Scheduled Meds: . atorvastatin  40 mg Oral Daily  . bicalutamide  50 mg Oral Daily  . cefTRIAXone (ROCEPHIN)  IV  1 g Intravenous Q24H  . clopidogrel  75 mg Oral Q breakfast  . doxycycline  100 mg Oral Q12H  . enoxaparin (LOVENOX) injection  40 mg Subcutaneous Q24H  . pantoprazole  40 mg Oral Daily   Continuous Infusions:   Active Problems:   Hypertension   CVA (cerebral infarction)   CAP (community acquired pneumonia)   CKD (chronic kidney disease), stage III    Time spent: >35  minutes    Theia Dezeeuw  [facility] Pager [phone]. If 7PM-7AM, please contact night-coverage at [email or web address], password [email or web address] 09/21/2013, 1:01 PM  LOS: 2 days             

## 2013-09-21 NOTE — Progress Notes (Signed)
Speech Language Pathology Treatment: Dysphagia  Patient Details Name: RONEY YOUTZ MRN: [id number] DOB: [birth year] Today's Date: 09/21/2013 Time: 9528-4132 SLP Time Calculation (min): 24 min  Assessment / Plan / Recommendation Clinical Impression  Minimal reinforcement of strategies given; pt following chin tuck very well though improved with min cues to tuck chin more fully. Excessive oral secretion with some gagging well after POs and oral care complete. Is there medication that could help with secretions?  Continue current diet.    HPI HPI: 77 y.o. male with known stroke risk factors of HTN and hyperlipidemia. Patient was LSN on Saturday and awoke on Sunday having left facial droop and dysarthria. While in ED patient had a MRI showing right parietal infarct. Pt also reported to have difficutly eating.    Pertinent Vitals NA  SLP Plan  Continue with current plan of care    Recommendations Diet recommendations: Dysphagia 1 (puree);Honey-thick liquid Liquids provided via: Teaspoon Medication Administration: Crushed with puree Supervision: Patient able to self feed;Full supervision/cueing for compensatory strategies Compensations: Slow rate;Small sips/bites;Check for anterior loss;Check for pocketing;Multiple dry swallows after each bite/sip;Effortful swallow Postural Changes and/or Swallow Maneuvers: Chin tuck;Seated upright 90 degrees;Upright 30-60 min after meal              Oral Care Recommendations: Oral care before and after PO Follow up Recommendations: Skilled Nursing facility Plan: Continue with current plan of care    GO    [facility], MA CCC-SLP [phone]  Claudine Mouton 09/21/2013, 4:07 PM

## 2013-09-21 NOTE — Progress Notes (Signed)
Influenza

## 2013-09-22 ENCOUNTER — Encounter (HOSPITAL_COMMUNITY): Payer: Self-pay | Admitting: *Deleted

## 2013-09-22 ENCOUNTER — Encounter (HOSPITAL_COMMUNITY)
Admission: EM | Disposition: A | Discharge status: Skilled Nursing Facility | Payer: Self-pay | Source: Home / Self Care | Attending: Family Medicine

## 2013-09-22 DIAGNOSIS — I453 Trifascicular block: Secondary | ICD-10-CM

## 2013-09-22 DIAGNOSIS — I2510 Atherosclerotic heart disease of native coronary artery without angina pectoris: Secondary | ICD-10-CM

## 2013-09-22 DIAGNOSIS — I10 Essential (primary) hypertension: Secondary | ICD-10-CM

## 2013-09-22 DIAGNOSIS — I441 Atrioventricular block, second degree: Secondary | ICD-10-CM

## 2013-09-22 DIAGNOSIS — D62 Acute posthemorrhagic anemia: Secondary | ICD-10-CM

## 2013-09-22 HISTORY — PX: LOOP RECORDER IMPLANT: SHX5477

## 2013-09-22 SURGERY — LOOP RECORDER IMPLANT
Anesthesia: LOCAL

## 2013-09-22 SURGERY — CANCELLED PROCEDURE

## 2013-09-22 MED ORDER — LIDOCAINE VISCOUS 2 % MT SOLN
OROMUCOSAL | Status: AC
  Filled 2013-09-22: qty 15

## 2013-09-22 MED ORDER — FENTANYL CITRATE 0.05 MG/ML IJ SOLN
INTRAMUSCULAR | Status: AC
  Filled 2013-09-22: qty 2

## 2013-09-22 MED ORDER — DIPHENHYDRAMINE HCL 50 MG/ML IJ SOLN
INTRAMUSCULAR | Status: AC
  Filled 2013-09-22: qty 1

## 2013-09-22 MED ORDER — MIDAZOLAM HCL 10 MG/2ML IJ SOLN
INTRAMUSCULAR | Status: DC | PRN
  Administered 2013-09-22: 2 mg via INTRAVENOUS

## 2013-09-22 MED ORDER — MIDAZOLAM HCL 5 MG/ML IJ SOLN
INTRAMUSCULAR | Status: AC
  Filled 2013-09-22: qty 2

## 2013-09-22 MED ORDER — FENTANYL CITRATE 0.05 MG/ML IJ SOLN
INTRAMUSCULAR | Status: DC | PRN
  Administered 2013-09-22: 25 ug via INTRAVENOUS

## 2013-09-22 MED ORDER — SODIUM CHLORIDE 0.9 % IV SOLN
INTRAVENOUS | Status: DC
  Administered 2013-09-22: 14:00:00 via INTRAVENOUS

## 2013-09-22 NOTE — Progress Notes (Signed)
Assisting Clinical Child psychotherapist (CSW) visited pt room to assess for SNF placement however pt currently having a procedure.  Full assessment to follow.  Theresia Bough, MSW, LCSW [phone]

## 2013-09-22 NOTE — Op Note (Signed)
Patient unable to swallow TEE probe  Would only breathe through mouth.  Desaturated and holds breath.  Would recomm rescheduling with anesthesia assist.

## 2013-09-22 NOTE — Progress Notes (Addendum)
[facility] Care Management recently received referral from PCP. [facility] telephonic Care Coordinator had been trying to reach patient telephonically prior to admission. However, had been unable to reach. Noted patient's discharge likely to SNF. Will continue to follow. Raiford Noble, MSN-Ed, [facility] [phone]

## 2013-09-22 NOTE — Interval H&P Note (Signed)
History and Physical Interval Note:  09/22/2013 2:57 PM  Matthew Mahoney  has presented today for surgery, with the diagnosis of stroke  The various methods of treatment have been discussed with the patient and family. After consideration of risks, benefits and other options for treatment, the patient has consented to  Procedure(s): TRANSESOPHAGEAL ECHOCARDIOGRAM (TEE) (N/A) as a surgical intervention .  The patient's history has been reviewed, patient examined, no change in status, stable for surgery.  I have reviewed the patient's chart and labs.  Questions were answered to the patient's satisfaction.     [name]

## 2013-09-22 NOTE — Progress Notes (Signed)
PT Cancellation Note  Patient Details Name: MARSHON BANGS MRN: [id number] DOB: [birth year]   Cancelled Treatment:    Reason Eval/Treat Not Completed: Patient at procedure or test/unavailable   Fabio Asa 09/22/2013, 3:30 PM

## 2013-09-22 NOTE — H&P (View-Only) (Signed)
[facility] PROGRESS NOTE  SUSANO CLECKLER UYQ:[id number] DOB: 1936-08-13 DOA: 09/19/2013 PCP: Willow Ora, MD  Assessment/Plan: 1. CVA - Plavix 75 mg started per neurology - MRI:5 mm acute infarct right occipital lobe. Acute infarct right frontal  parietal cortex and subcortical white matter. - ECHO: Mildly dilated LV with severe diffuse hypokinesis, EF 20-25%. Moderate diastolic dysfunction with evidence for elevated LV filling pressure. Normal RV size with mildly decreased systolic function. Mild MR. -Carotid dopplers:Bilateral: 1-39% ICA stenosis. Vertebral artery flow is antegrade - Speech therapy recommends dysphagia with honey thicken  -PT/OT recommends SNF  - TEE to be done with [facility] tomorrow 09/22/13 to look for embolic source.  Per neurology If positive for patent foramen oval ,  bilateral lower extremity venous dopplers to be done to rule out DVT as possible source of stroke. -If TEE negative, [place] cardiologist will place implantable loop recorder to evaluate for atrial fibrillation as etiology of stroke.  - NPO for TEE tomorrow    2. HTN Range sbp 139-190/ dbp 70-119 for 09/21/2013 Will continue to monitor - Add patient's ARB   3. Community acquired pneumonia -GL:OVFIEP infiltrate is noted throughout the mid and lower right lung no sizable effusion is noted. -Afebrile - on rocephin and doxycycline (day 2) due to prolonged QT interval  - Given that patient has had trouble with swallowing and waited 3 days prior to presentation suspect patient most likely had aspiration pneumonia.  4. CKD Stage III -Creatinine 1.64 (09/20/13) which is patient's baseline -BMP in am    Code Status: Full  Family Communication: POC discussed with patient  Disposition Plan: pending neurology evaluation and  recommendations   Consultants:  Neurology  Procedures:  none  Antibiotics:  Rocephin started  09/19/2013  Doxycycline started  09/19/2013  HPI/Subjective: 77 year old male with history of HTN, Aortic stenosis, COPD, CKD stage III who presented to ED with slurred speech and left sided facial droop for three days starting 09/16/2013.  MRI revealed 5 MM acute infract in right occipital lobe.    Pt with no new complaints this am. Pt with continued left sided mouth droop and moderate drooling. Pt able to move all extremities, but reports left leg is weak due to prior hip injury. Patient currently denies headache, SOB, chest pain, and numbness and tingling.   Objective: Filed Vitals:   09/21/13 1111  BP: 159/113  Pulse: 69  Temp: 98.1 F (36.7 C)  Resp: 18    Intake/Output Summary (Last 24 hours) at 09/21/13 1301 Last data filed at 09/21/13 1256  Gross per 24 hour  Intake     60 ml  Output    800 ml  Net   -740 ml   Filed Weights   09/19/13 1535  Weight: 83.598 kg (184 lb 4.8 oz)    Exam:   General: Pt in bed in NAD  Cardiovascular: S1 and S2  Present with no MRG. Radial and pedal pulses 2+ with no cyanosis or edema  Respiratory: regular unlabored breathing. Lungs CTA with no wheeze or rhonchi  Abdomen: symmetric soft, ND, NT . BS X4   Musculoskeletal: moves all extremities .   Neurological: left mouth droop with drooling. Tongue deviated to left. Normal finger to nose and heel to shin   Data Reviewed: Basic Metabolic Panel:  Recent Labs Lab 09/19/13 1230 09/19/13 1935  NA 138  140  --   K 4.1  3.9  --   CL 98  102  --  CO2 22  --   GLUCOSE 88  88  --   BUN 25*  27*  --   CREATININE 1.63*  2.00* 1.64*  CALCIUM 10.0  --    Liver Function Tests:  Recent Labs Lab 09/19/13 1230  AST 20  ALT 22  ALKPHOS 95  BILITOT 1.2  PROT 7.7  ALBUMIN 4.5   No results found for this basename: LIPASE, AMYLASE,  in the last 168 hours No results found for this basename: AMMONIA,  in the last 168 hours CBC:  Recent Labs Lab 09/19/13 1230 09/19/13 1935  WBC 7.2 7.8  NEUTROABS 4.7  --    HGB 14.7  16.0 12.7*  HCT 43.4  47.0 37.4*  MCV 88.9 89.3  PLT 116* 99*   Cardiac Enzymes:  Recent Labs Lab 09/19/13 1230  TROPONINI <0.30   BNP (last 3 results) No results found for this basename: PROBNP,  in the last 8760 hours CBG:  Recent Labs Lab 09/19/13 1205  GLUCAP 80    No results found for this or any previous visit (from the past 240 hour(s)).   Studies: Mr Shirlee Latch Wo Contrast  09/19/2013   CLINICAL DATA:  Left facial droop. Slurred speech. Stroke.  EXAM: MRI HEAD WITHOUT CONTRAST  MRA HEAD WITHOUT CONTRAST  TECHNIQUE: Multiplanar, multiecho pulse sequences of the brain and surrounding structures were obtained without intravenous contrast. Angiographic images of the head were obtained using MRA technique without contrast.  COMPARISON:  CT 09/19/2013. MRI 04/29/2013  FINDINGS: MRI HEAD FINDINGS  5 mm acute infarct right occipital lobe. Acute infarct right frontal parietal cortex and subcortical white matter. No other areas of acute infarction.  Generalized atrophy. Chronic microvascular ischemic change throughout the white matter bilaterally.  Negative for hemorrhage or mass. No midline shift. Paranasal sinuses are clear.  MRA HEAD FINDINGS  Both vertebral arteries are patent to the basilar. Right PICA is patent. Left PICA not visualized. Left AICA is patent. Right AICA not visualized. Superior cerebellar and posterior cerebral arteries are patent bilaterally without significant stenosis.  Mild atherosclerotic disease in the left cavernous carotid which is patent without significant stenosis. Left anterior and middle cerebral arteries are patent.  Right cavernous carotid is widely patent. Right anterior and middle cerebral arteries are widely patent.  Negative for cerebral aneurysm.  IMPRESSION: MRI HEAD IMPRESSION  5 mm acute infarct right occipital lobe. Moderately large acute infarct right frontal parietal cortex and white matter.  Generalized atrophy with chronic  microvascular ischemic change.  MRA HEAD IMPRESSION  No significant intracranial stenosis.   Electronically Signed   By: Marlan Palau M.D.   On: 09/19/2013 15:49   Mr Brain Wo Contrast  09/19/2013   CLINICAL DATA:  Left facial droop. Slurred speech. Stroke.  EXAM: MRI HEAD WITHOUT CONTRAST  MRA HEAD WITHOUT CONTRAST  TECHNIQUE: Multiplanar, multiecho pulse sequences of the brain and surrounding structures were obtained without intravenous contrast. Angiographic images of the head were obtained using MRA technique without contrast.  COMPARISON:  CT 09/19/2013. MRI 04/29/2013  FINDINGS: MRI HEAD FINDINGS  5 mm acute infarct right occipital lobe. Acute infarct right frontal parietal cortex and subcortical white matter. No other areas of acute infarction.  Generalized atrophy. Chronic microvascular ischemic change throughout the white matter bilaterally.  Negative for hemorrhage or mass. No midline shift. Paranasal sinuses are clear.  MRA HEAD FINDINGS  Both vertebral arteries are patent to the basilar. Right PICA is patent. Left PICA not visualized. Left AICA is patent.  Right AICA not visualized. Superior cerebellar and posterior cerebral arteries are patent bilaterally without significant stenosis.  Mild atherosclerotic disease in the left cavernous carotid which is patent without significant stenosis. Left anterior and middle cerebral arteries are patent.  Right cavernous carotid is widely patent. Right anterior and middle cerebral arteries are widely patent.  Negative for cerebral aneurysm.  IMPRESSION: MRI HEAD IMPRESSION  5 mm acute infarct right occipital lobe. Moderately large acute infarct right frontal parietal cortex and white matter.  Generalized atrophy with chronic microvascular ischemic change.  MRA HEAD IMPRESSION  No significant intracranial stenosis.   Electronically Signed   By: Marlan Palau M.D.   On: 09/19/2013 15:49   Dg Swallowing Func-speech Pathology  09/20/2013   Leah Meryl McCoy,  CCC-SLP     09/20/2013 11:09 AM Objective Swallowing Evaluation: Modified Barium Swallowing Study   Patient Details  Name: BUCKLEY BRADLY MRN: [id number] Date of Birth: January 02, 1936  Today's Date: 09/20/2013 Time: 1035-1056 SLP Time Calculation (min): 21 min  Past Medical History:  Past Medical History  Diagnosis Date  . Hypertension   . Edema of leg   . COPD (chronic obstructive pulmonary disease)   . Choledocholithiasis   . Coronary arteriosclerosis   . Hyperlipidemia   . Ichthyosis   . Mitral regurgitation   . Onychomycosis   . Vitamin D deficiency   . Aortic stenosis   . Hyperkalemia   . Hypocalcemia   . Lymphadenopathy   . Prostate cancer   . Status post lung surgery 12/07/2001    BENIGHN LESION ON RIGHT LUNG REMOVED  . Ascites     HISTORY  . History of colonic diverticulitis   . Olecranon bursitis    Past Surgical History:  Past Surgical History  Procedure Laterality Date  . Hemorrhoid surgery  12/07/1998  . Lung surgery  12/07/2001    BENIGN LESION ON RIGHT LUNG REMOVED  . Colonoscopy    . Femur im nail Left 01/21/2013    Procedure: INTRAMEDULLARY (IM) NAIL FEMORAL;  Surgeon: Velna Ochs, MD;  Location: [facility] ORS;  Service: Orthopedics;   Laterality: Left;  . Esophagogastroduodenoscopy N/A 03/16/2013    Procedure: ESOPHAGOGASTRODUODENOSCOPY (EGD);  Surgeon: Florencia Reasons, MD;  Location: [facility] ENDOSCOPY;  Service: Endoscopy;   Laterality: N/A;  will probably want to do at bedside in  ICU---bed placement pending   HPI:  77 y.o. male with known stroke risk factors of HTN and  hyperlipidemia. Patient was LSN on Saturday and awoke on Sunday  having left facial droop and dysarthria. While in ED patient had  a MRI showing right parietal infarct. Pt also reported to have  difficutly eating.      Assessment / Plan / Recommendation Clinical Impression  Dysphagia Diagnosis: Moderate pharyngeal phase dysphagia;Severe  pharyngeal phase dysphagia;Moderate oral phase dysphagia;Severe  oral phase dysphagia Clinical impression:  Patient presents with a moderate-severe  sensory motor based oropharyngeal dysphagia marked by severe left  sided oral deficits resulting in decreased bolus cohesion,  anterior spillage, decreased posterior propulsion, and oral  residuals followed by a delay in swallow initiation which leads  to penetration of all consistencies tested. The use of a chin  tuck assisted to decrease residue and penetration episodes which  were flash with puree and honey thick liquids however patient  continued to penetrate thinner consistencies. SLP provided verbal  and visual cueing for this strategy as well as tight labial seal  which was minimally effective at decreasing anterior labial  spillage. Although aspiration risk remains moderately high,  patient very receptive to use of compensatory strategies and  aspiration precautions which were effective to decrease risks.  Recommend initiation of a conservative diet with strict use of  precautions and close SLP f/u.      Treatment Recommendation  Therapy as outlined in treatment plan below    Diet Recommendation Dysphagia 1 (Puree);Honey-thick liquid   Liquid Administration via: Spoon Medication Administration: Crushed with puree Supervision: Patient able to self feed;Full supervision/cueing  for compensatory strategies Compensations: Slow rate;Small sips/bites;Check for anterior  loss;Check for pocketing;Multiple dry swallows after each  bite/sip;Effortful swallow Postural Changes and/or Swallow Maneuvers: Chin tuck;Seated  upright 90 degrees;Upright 30-60 min after meal    Other  Recommendations Recommended Consults: MBS Oral Care Recommendations: Oral care before and after PO Other Recommendations: Have oral suction available (SLp placed)   Follow Up Recommendations  Skilled Nursing facility    Frequency and Duration min 3x week  2 weeks       SLP Swallow Goals Patient will utilize recommended strategies during swallow to  increase swallowing safety with: Supervision/safety Swallow  Study Goal #2 - Progress: Not met   General HPI: 77 y.o. male with known stroke risk factors of HTN  and hyperlipidemia. Patient was LSN on Saturday and awoke on  Sunday having left facial droop and dysarthria. While in ED  patient had a MRI showing right parietal infarct. Pt also  reported to have difficutly eating.  Type of Study: Modified Barium Swallowing Study Reason for Referral: Objectively evaluate swallowing function Previous Swallow Assessment: none Diet Prior to this Study: NPO Temperature Spikes Noted: No Respiratory Status: Nasal cannula History of Recent Intubation: No Behavior/Cognition: Alert;Cooperative;Pleasant mood Oral Cavity - Dentition: Dentures, top;Poor condition;Missing  dentition Oral Motor / Sensory Function: Impaired - see Bedside swallow  eval Self-Feeding Abilities: Able to feed self Patient Positioning: Upright in chair Baseline Vocal Quality: Wet Volitional Cough: Wet Volitional Swallow: Able to elicit Anatomy: Other (Comment) (? ostephytes along cervical spine; MD  not present to confirm) Pharyngeal Secretions: Not observed secondary MBS (however  suspect based on wet vocal quality)    Reason for Referral Objectively evaluate swallowing function   Oral Phase Oral Preparation/Oral Phase Oral Phase: Impaired Oral - Honey Oral - Honey Teaspoon: Left anterior bolus loss;Weak lingual  manipulation;Lingual pumping;Reduced posterior propulsion;Left  pocketing in lateral sulci;Lingual/palatal residue;Delayed oral  transit Oral - Nectar Oral - Nectar Teaspoon: Left anterior bolus loss;Weak lingual  manipulation;Lingual pumping;Reduced posterior propulsion;Left  pocketing in lateral sulci;Lingual/palatal residue;Delayed oral  transit Oral - Solids Oral - Puree: Left anterior bolus loss;Weak lingual  manipulation;Lingual pumping;Reduced posterior propulsion;Left  pocketing in lateral sulci;Lingual/palatal residue;Delayed oral  transit   Pharyngeal Phase Pharyngeal Phase Pharyngeal Phase:  Impaired Pharyngeal - Honey Pharyngeal - Honey Teaspoon: Delayed swallow initiation;Premature  spillage to valleculae;Reduced tongue base retraction;Pharyngeal  residue - valleculae;Penetration/Aspiration before  swallow;Compensatory strategies attempted (Comment) Penetration/Aspiration details (honey teaspoon): Material enters  airway, remains ABOVE vocal cords then ejected out (flash  penetration decreases residue and penetration) Pharyngeal - Nectar Pharyngeal - Nectar Teaspoon: Delayed swallow  initiation;Premature spillage to pyriform sinuses;Reduced tongue  base retraction;Penetration/Aspiration during  swallow;Penetration/Aspiration after swallow;Pharyngeal residue -  valleculae;Compensatory strategies attempted (Comment) Penetration/Aspiration details (nectar teaspoon): Material enters  airway, CONTACTS cords and not ejected out (flash penetration  decreases residue ) Pharyngeal - Solids Pharyngeal - Puree: Delayed swallow initiation;Premature spillage  to valleculae;Reduced tongue base retraction;Pharyngeal residue -  valleculae;Penetration/Aspiration before swallow Penetration/Aspiration details (puree): Material enters  airway,  remains ABOVE vocal cords then ejected out (flash penetration  decreases residue and penetration)  Cervical Esophageal Phase    GO   Ferdinand Lango MA, CCC-SLP [phone]  Cervical Esophageal Phase Cervical Esophageal Phase: [facility]         McCoy Leah Meryl 09/20/2013, 11:09 AM     Scheduled Meds: . atorvastatin  40 mg Oral Daily  . bicalutamide  50 mg Oral Daily  . cefTRIAXone (ROCEPHIN)  IV  1 g Intravenous Q24H  . clopidogrel  75 mg Oral Q breakfast  . doxycycline  100 mg Oral Q12H  . enoxaparin (LOVENOX) injection  40 mg Subcutaneous Q24H  . pantoprazole  40 mg Oral Daily   Continuous Infusions:   Active Problems:   Hypertension   CVA (cerebral infarction)   CAP (community acquired pneumonia)   CKD (chronic kidney disease), stage III    Time spent: >35  minutes    Penny Pia  [facility] Pager [phone]. If 7PM-7AM, please contact night-coverage at [email or web address], password [facility] 09/21/2013, 1:01 PM  LOS: 2 days

## 2013-09-22 NOTE — Progress Notes (Signed)
Stroke Team Progress Note  HISTORY Matthew Mahoney is an 77 y.o. male who was last seen normal on Saturday 09/16/13. He awoke on Sunday and noted he may have had a facial droop on the left. Later that day he was eating stew and noted he had a hard time keeping it in his mouth. He was brought to ED 10/14 for further evaluation. MRI brain was obtained and is positive for right parietal infarct. There is question of possible Afib en route with EMS. 12 lead obtained in hospital showed NSR. Currently patient is hooked up to telemetry and showing NSR.  Patient was not a TPA candidate secondary to delay in arrival. He was admitted for further evaluation and treatment.  SUBJECTIVE No family at bedside.  OBJECTIVE Most recent Vital Signs: Filed Vitals:   09/21/13 1849 09/21/13 2230 09/22/13 0242 09/22/13 0545  BP: 131/76 157/94 149/90 160/99  Pulse: 84 77 76 73  Temp: 97.8 F (36.6 C) 97.6 F (36.4 C) 97.8 F (36.6 C) 98 F (36.7 C)  TempSrc: Oral Oral Oral Oral  Resp: 20 20 20 20   Height:      Weight:      SpO2: 99% 96% 92% 94%   CBG (last 3)   Recent Labs  09/19/13 1205  GLUCAP 80    IV Fluid Intake:     MEDICATIONS  . atorvastatin  40 mg Oral Daily  . bicalutamide  50 mg Oral Daily  . cefTRIAXone (ROCEPHIN)  IV  1 g Intravenous Q24H  . clopidogrel  75 mg Oral Q breakfast  . doxycycline  100 mg Oral Q12H  . enoxaparin (LOVENOX) injection  40 mg Subcutaneous Q24H  . irbesartan  300 mg Oral Daily  . pantoprazole  40 mg Oral Daily   PRN:  food thickener, senna-docusate  Diet:  NPO  Activity:  As tolerated DVT Prophylaxis:  Lovenox 40 mg sq daily   CLINICALLY SIGNIFICANT STUDIES Basic Metabolic Panel:   Recent Labs Lab 09/19/13 1230 09/19/13 1935  NA 138  140  --   K 4.1  3.9  --   CL 98  102  --   CO2 22  --   GLUCOSE 88  88  --   BUN 25*  27*  --   CREATININE 1.63*  2.00* 1.64*  CALCIUM 10.0  --    Liver Function Tests:   Recent Labs Lab  09/19/13 1230  AST 20  ALT 22  ALKPHOS 95  BILITOT 1.2  PROT 7.7  ALBUMIN 4.5   CBC:   Recent Labs Lab 09/19/13 1230 09/19/13 1935  WBC 7.2 7.8  NEUTROABS 4.7  --   HGB 14.7  16.0 12.7*  HCT 43.4  47.0 37.4*  MCV 88.9 89.3  PLT 116* 99*   Coagulation:   Recent Labs Lab 09/19/13 1230  LABPROT 13.1  INR 1.01   Cardiac Enzymes:   Recent Labs Lab 09/19/13 1230  TROPONINI <0.30   Urinalysis:   Recent Labs Lab 09/19/13 1331  COLORURINE YELLOW  LABSPEC 1.016  PHURINE 5.0  GLUCOSEU NEGATIVE  HGBUR NEGATIVE  BILIRUBINUR SMALL*  KETONESUR 15*  PROTEINUR 30*  UROBILINOGEN 1.0  NITRITE NEGATIVE  LEUKOCYTESUR NEGATIVE   Lipid Panel    Component Value Date/Time   CHOL 120 09/20/2013 0500   TRIG 68 09/20/2013 0500   HDL 39* 09/20/2013 0500   CHOLHDL 3.1 09/20/2013 0500   VLDL 14 09/20/2013 0500   LDLCALC 67 09/20/2013 0500   HgbA1C  Lab  Results  Component Value Date   HGBA1C 5.0 09/20/2013    Urine Drug Screen:     Component Value Date/Time   LABOPIA NONE DETECTED 09/19/2013 1331   COCAINSCRNUR NONE DETECTED 09/19/2013 1331   LABBENZ NONE DETECTED 09/19/2013 1331   AMPHETMU NONE DETECTED 09/19/2013 1331   THCU NONE DETECTED 09/19/2013 1331   LABBARB NONE DETECTED 09/19/2013 1331    Alcohol Level:   Recent Labs Lab 09/19/13 1230  ETH <11    CT of the brain  09/19/2013    No intracranial hemorrhage.  Prominent small vessel disease type changes without CT evidence of large acute infarct. Streak artifacts slightly limits evaluation of the posterior fossa structures.  Global atrophy without hydrocephalus.     MRI of the brain  09/19/2013    5 mm acute infarct right occipital lobe. Moderately large acute infarct right frontal parietal cortex and white matter.  Generalized atrophy with chronic microvascular ischemic change.    MRA of the brain  09/19/2013    No significant intracranial stenosis.  2D Echocardiogram  EF 20-25%. Diffuse  hypokinesis. Moderate diastolic dysfunction with evidence for elevated LV filling pressure. Normal RV size with mildly decreased systolic function. Mild MR.  TEE   Carotid Doppler  No evidence of hemodynamically significant internal carotid artery stenosis. Vertebral artery flow is antegrade.   CXR  09/19/2013   Patchy right lung infiltrate.    EKG  normal sinus rhythm, baseline wander (see ECG for full results).   Therapy Recommendations SNF  Physical Exam   Pleasant elderly Caucasian male currently not in distress.Awake alert. Afebrile. Head is nontraumatic. Neck is supple without bruit. Hearing is normal. Cardiac exam no murmur or gallop. Lungs are clear to auscultation. Distal pulses are well felt. Neurological Exam :  Awake alert oriented x3 with no aphasia. Minimum dysarthria. Extraocular movements are full range without nystagmus. Blinks to threat bilaterally. Tongue deviates to the left. Moderate left lower facial weakness. No upper or lower extremity drift but fine finger movements are diminished on the left left grip is weak. Mild left hip flexor weakness. Orbits right over left upper extremity. Sensation appears preserved. Coordination is intact. Gait was not tested.  ASSESSMENT [name] is a 77 y.o. male presenting with left facial droop. Imaging confirms a right occipital and right frontal parietal cortical/white matter infarcts. Given multiple vascular distributions of the current strokes, infarcts initially felt to be  embolic secondary to presumed afib - though after review of EMS records atrial fib was not recognized. ECG here without afib. Infarct embolic due to unknown source pt. Patient was on no antithrombotics prior to admission. Now on clopidogrel 75 mg orally every day for secondary stroke prevention. Work up underway.  Hypertension, home med resumed Community acquired pneumonia, likely due to aspiration, on rocephin and doxycycline COPD CAD Hyperlipidemia,  LDL 67, on lipitor 40 PTA, now on lipitor 40, goal LDL < 100 (< 70 for diabetics) aoritc stenosis  CKD, stage III, Cr 1.63-2.0  Hospital day # 3  TREATMENT/PLAN  Continue clopidogrel 75 mg orally every day for now for secondary stroke prevention. TEE to look for embolic source. Arranged with [facility] for today.  If positive for PFO (patent foramen ovale), check bilateral lower extremity venous dopplers to rule out DVT as possible source of stroke. If TEE negative,  cardiologist will place implantable loop recorder to evaluate for atrial fibrillation as etiology of stroke. This has been explained to patient by Dr. Pearlean Brownie and  he is agreeable. Show pt loop recorder video SNF vs CIR   Dr. Pearlean Brownie discussed diagnosis, prognosis,  treatment options and plan of care with pt and Dr. Cena Benton.    Annie Main, MSN, RN, ANVP-BC, ANP-BC, [facility] Pager: [phone] 09/22/2013 10:29 AM  I have personally obtained a history, examined the patient, evaluated imaging results, and formulated the assessment and plan of care. I agree with the above. Delia Heady, MD

## 2013-09-22 NOTE — Consult Note (Signed)
ELECTROPHYSIOLOGY CONSULT NOTE  Patient ID: Matthew Mahoney MRN: [id number], DOB/AGE: 1936/08/25   Admit date: 09/19/2013 Date of Consult: 09/22/2013  Primary Physician: Willow Ora, MD Primary Cardiologist: [place] Reason for Consultation: Cryptogenic stroke; recommendations regarding Implantable Loop Recorder  History of Present Illness Matthew Mahoney was admitted on 09/19/2013 with acute CVA. he has been monitored on telemetry which has demonstrated no arrhythmias.  There was a question of atrial fibrillation while en route to the hospital by EMS, but review of these strips demonstrate sinus rhythm with occasional blocked PAC's and occasional Mobitz I heart block.  No cause has been identified for his stroke. Inpatient stroke work-up is to be completed with a TEE. EP has been asked to evaluate for placement of an implantable loop recorder to monitor for atrial fibrillation.  He has known conduction disease with first degree AV block, incomplete RBBB, LAFB and prior Mobitz I heart block. He has a history of coronary artery disease with remote stents and has been maintained on Plavix.  Last echo in 2010 demonstrated normal LVEF.  Echocardiogram this admission demonstrates an EF of 20-25% with diffuse hypokinesis.   Past Medical History Past Medical History  Diagnosis Date  . Hypertension   . Edema of leg   . COPD (chronic obstructive pulmonary disease)   . Choledocholithiasis   . Coronary arteriosclerosis   . Hyperlipidemia   . Ichthyosis   . Mitral regurgitation   . Onychomycosis   . Vitamin D deficiency   . Aortic stenosis   . Hyperkalemia   . Hypocalcemia   . Lymphadenopathy   . Prostate cancer   . Status post lung surgery 12/07/2001    BENIGHN LESION ON RIGHT LUNG REMOVED  . Ascites     HISTORY  . History of colonic diverticulitis   . Olecranon bursitis     Past Surgical History Past Surgical History  Procedure Laterality Date  . Hemorrhoid surgery  12/07/1998  .  Lung surgery  12/07/2001    BENIGN LESION ON RIGHT LUNG REMOVED  . Colonoscopy    . Femur im nail Left 01/21/2013    Procedure: INTRAMEDULLARY (IM) NAIL FEMORAL;  Surgeon: Velna Ochs, MD;  Location: [facility];  Service: Orthopedics;  Laterality: Left;  . Esophagogastroduodenoscopy N/A 03/16/2013    Procedure: ESOPHAGOGASTRODUODENOSCOPY (EGD);  Surgeon: Florencia Reasons, MD;  Location: [facility] ENDOSCOPY;  Service: Endoscopy;  Laterality: N/A;  will probably want to do at bedside in ICU---bed placement pending    Allergies/Intolerances No Known Allergies Inpatient Medications . atorvastatin  40 mg Oral Daily  . bicalutamide  50 mg Oral Daily  . cefTRIAXone (ROCEPHIN)  IV  1 g Intravenous Q24H  . clopidogrel  75 mg Oral Q breakfast  . doxycycline  100 mg Oral Q12H  . enoxaparin (LOVENOX) injection  40 mg Subcutaneous Q24H  . irbesartan  300 mg Oral Daily  . pantoprazole  40 mg Oral Daily     Social History History   Social History  . Marital Status: Widowed    Spouse Name: N/A    Number of Children: N/A  . Years of Education: N/A   Occupational History  . Not on file.   Social History Main Topics  . Smoking status: Current Some Day Smoker -- 1.50 packs/day    Types: Cigarettes  . Smokeless tobacco: Not on file  . Alcohol Use: Yes     Comment: occasionally  . Drug Use: No  . Sexual Activity: Not Currently  Other Topics Concern  . Not on file   Social History Narrative  . No narrative on file    Review of Systems General: No chills, fever, night sweats or weight changes  Cardiovascular:  No chest pain, dyspnea on exertion, edema, orthopnea, palpitations, paroxysmal nocturnal dyspnea Dermatological: No rash, lesions or masses Respiratory: No cough, dyspnea Urologic: No hematuria, dysuria Abdominal: No nausea, vomiting, diarrhea, bright red blood per rectum, melena, or hematemesis Neurologic: No visual changes, weakness, changes in mental status All other systems reviewed  and are otherwise negative except as noted above.  Physical Exam Blood pressure 160/99, pulse 73, temperature 98 F (36.7 C), temperature source Oral, resp. rate 20, height 6' (1.829 m), weight 184 lb 4.8 oz (83.598 kg), SpO2 94.00%.  General: elderly appearing 77 y.o. male in no acute distress. HEENT: Normocephalic, atraumatic. EOMs intact. Sclera nonicteric. Oropharynx clear.  Neck: Supple without bruits. No JVD. Lungs: Respirations regular and unlabored, CTA bilaterally. No wheezes, rales or rhonchi. Heart: RRR with frequent ectopy Abdomen: Soft, non-tender, non-distended. BS present x 4 quadrants. No hepatosplenomegaly.  Extremities: No clubbing, cyanosis or edema. DP/PT/Radials 2+ and equal bilaterally. Psych: Normal affect. Neuro: Alert and oriented X 3. Moves all extremities spontaneously. Skin: Intact. Warm and dry. No rashes or petechiae in exposed areas.   Labs Lab Results  Component Value Date   WBC 7.8 09/19/2013   HGB 12.7* 09/19/2013   HCT 37.4* 09/19/2013   MCV 89.3 09/19/2013   PLT 99* 09/19/2013    Recent Labs Lab 09/19/13 1230 09/19/13 1935  NA 138  140  --   K 4.1  3.9  --   CL 98  102  --   CO2 22  --   BUN 25*  27*  --   CREATININE 1.63*  2.00* 1.64*  CALCIUM 10.0  --   PROT 7.7  --   BILITOT 1.2  --   ALKPHOS 95  --   ALT 22  --   AST 20  --   GLUCOSE 88  88  --     Recent Labs  09/19/13 1230  INR 1.01    Radiology/Studies Dg Chest 2 View 09/19/2013   CLINICAL DATA:  Chest pain and congestion  EXAM: CHEST  2 VIEW  COMPARISON:  03/16/2013  FINDINGS: Cardiac shadow remains enlarged. Patchy infiltrate is noted throughout the mid and lower right lung no sizable effusion is noted. No acute bony abnormality is seen.  IMPRESSION: Patchy right lung infiltrate.   Electronically Signed   By: Alcide Clever M.D.   On: 09/19/2013 12:38   Ct Head Wo Contrast 09/19/2013   CLINICAL DATA:  Left-sided facial droop. Slurred speech. Hypertension.  Hyperlipidemia.  EXAM: CT HEAD WITHOUT CONTRAST  TECHNIQUE: Contiguous axial images were obtained from the base of the skull through the vertex without intravenous contrast.  COMPARISON:  01/20/2013.  FINDINGS: No intracranial hemorrhage.  Prominent small vessel disease type changes without CT evidence of large acute infarct. Streak artifacts slightly limits evaluation of the posterior fossa structures.  Global atrophy without hydrocephalus.  No intracranial mass lesion noted on this unenhanced exam.  Vascular calcifications.  Dental disease.  IMPRESSION: No intracranial hemorrhage.  Prominent small vessel disease type changes without CT evidence of large acute infarct. Streak artifacts slightly limits evaluation of the posterior fossa structures.  Global atrophy without hydrocephalus.   Electronically Signed   By: Bridgett Larsson M.D.   On: 09/19/2013 12:54   Mr Maxine Glenn Head Wo Contrast 09/19/2013  CLINICAL DATA:  Left facial droop. Slurred speech. Stroke.  EXAM: MRI HEAD WITHOUT CONTRAST  MRA HEAD WITHOUT CONTRAST  TECHNIQUE: Multiplanar, multiecho pulse sequences of the brain and surrounding structures were obtained without intravenous contrast. Angiographic images of the head were obtained using MRA technique without contrast.  COMPARISON:  CT 09/19/2013. MRI 04/29/2013  FINDINGS: MRI HEAD FINDINGS  5 mm acute infarct right occipital lobe. Acute infarct right frontal parietal cortex and subcortical white matter. No other areas of acute infarction.  Generalized atrophy. Chronic microvascular ischemic change throughout the white matter bilaterally.  Negative for hemorrhage or mass. No midline shift. Paranasal sinuses are clear.  MRA HEAD FINDINGS  Both vertebral arteries are patent to the basilar. Right PICA is patent. Left PICA not visualized. Left AICA is patent. Right AICA not visualized. Superior cerebellar and posterior cerebral arteries are patent bilaterally without significant stenosis.  Mild atherosclerotic  disease in the left cavernous carotid which is patent without significant stenosis. Left anterior and middle cerebral arteries are patent.  Right cavernous carotid is widely patent. Right anterior and middle cerebral arteries are widely patent.  Negative for cerebral aneurysm.  IMPRESSION: MRI HEAD IMPRESSION  5 mm acute infarct right occipital lobe. Moderately large acute infarct right frontal parietal cortex and white matter.  Generalized atrophy with chronic microvascular ischemic change.  MRA HEAD IMPRESSION  No significant intracranial stenosis.   Electronically Signed   By: Marlan Palau M.D.   On: 09/19/2013 15:49    Echocardiogram  09-20-2013 EF 20-25%, diffuse hypokinesis, LA 49 Last echo 04-11-2009 demonstrated EF 55-60%  Telemetry sinus rhythm/sinus tach with 1st degree AV block and occasional Mobitz I   Assessment and Plan 1. Cryptogenic stroke The patient has had a cryptogenic stroke.  EP is consulted for consideration of an implantable loop recorder.  I think that at this point, I would recommend a 30 day event monitor rather than a LINQ device as the patient has a newly depressed ejection fraction with ongoing shortness of breath.   2. Acute systolic dysfunction The patients EF is newly depressed.  The patient has moderate SOB on exam.   I think that further CV risk stratification is required.  The patient will likely need an ischemic evaluation as he recovers from his stroke.  This could potentially be performed as an outpatient if necessary.  The patient is on an ARB.  Consider adding a beta blocker with caution (given mobitz I second degree AV block and COPD).  3. Mobitz I second degree AV block Asymptomatic, though the patients ekg reveals a trifascicular block pattern.  He may eventually require pacing.  30 day event monitor as above.    4. HTN Above goal Consider adding low dose coreg if ok with neurology  Cardiology will continue to follow while here.  He will need  further outpatient workup and management by Dr Elease Hashimoto.

## 2013-09-22 NOTE — Progress Notes (Signed)
OT Cancellation Note  Patient Details Name: Matthew Mahoney MRN: [id number] DOB: Sep 16, 1936   Cancelled Treatment:    Reason Eval/Treat Not Completed: Patient at procedure or test/ unavailable (TEE)  Boone Master B 09/22/2013, 3:36 PM

## 2013-09-22 NOTE — Progress Notes (Signed)
[facility] PROGRESS NOTE  HURSHELL DINO UEA:[id number] DOB: Jan 20, 1936 DOA: 09/19/2013 PCP: Willow Ora, MD  Assessment/Plan: 1. CVA - Plavix 75 mg started per neurology - MRI:5 mm acute infarct right occipital lobe. Acute infarct right frontal  parietal cortex and subcortical white matter. - ECHO: Mildly dilated LV with severe diffuse hypokinesis, EF 20-25%. Moderate diastolic dysfunction with evidence for elevated LV filling pressure. Normal RV size with mildly decreased systolic function. Mild MR. -Carotid dopplers:Bilateral: 1-39% ICA stenosis. Vertebral artery flow is antegrade - Speech therapy recommends dysphagia with honey thicken  -PT/OT recommends SNF  - TEE Pending.  Per neurology If positive for patent foramen oval ,  bilateral lower extremity venous dopplers to be done to rule out DVT as possible source of stroke. -If TEE negative, [place] cardiologist will place implantable loop recorder to evaluate for atrial fibrillation as etiology of stroke.  - NPO for TEE tomorrow   2. HTN Range sbp 139-190/ dbp 70-119 for 09/21/2013 Will continue to monitor - Add patient's ARB   3. Community acquired pneumonia -YN:WGNFAO infiltrate is noted throughout the mid and lower right lung no sizable effusion is noted. -Afebrile - on rocephin and doxycycline (day 4) due to prolonged QT interval  - Given that patient has had trouble with swallowing and waited 3 days prior to presentation suspect patient most likely had aspiration pneumonia.  4. CKD Stage III -Creatinine 1.64 (09/20/13) which is patient's baseline -BMP in am    Code Status: Full  Family Communication: POC discussed with patient  Disposition Plan: pending neurology evaluation and  recommendations   Consultants:  Neurology  Procedures:  none  Antibiotics:  Rocephin started  09/19/2013  Doxycycline started 09/19/2013  HPI/Subjective: 77 year old male with history of HTN, Aortic stenosis, COPD,  CKD stage III who presented to ED with slurred speech and left sided facial droop for three days starting 09/16/2013.  MRI revealed 5 MM acute infract in right occipital lobe.   No new complaints at this juncture.  Objective: Filed Vitals:   09/22/13 1540  BP: 166/91  Pulse: 72  Temp:   Resp: 13   No intake or output data in the 24 hours ending 09/22/13 1703 Filed Weights   09/19/13 1535  Weight: 83.598 kg (184 lb 4.8 oz)    Exam:   General: Pt in bed in NAD  Cardiovascular: S1 and S2  Present with no MRG. Radial and pedal pulses 2+ with no cyanosis or edema  Respiratory: regular unlabored breathing. Lungs CTA with no wheeze or rhonchi  Abdomen: symmetric soft, ND, NT . BS X4   Musculoskeletal: moves all extremities .   Neurological: left mouth droop with drooling. Tongue deviated to left. Normal finger to nose and heel to shin   Data Reviewed: Basic Metabolic Panel:  Recent Labs Lab 09/19/13 1230 09/19/13 1935  NA 138  140  --   K 4.1  3.9  --   CL 98  102  --   CO2 22  --   GLUCOSE 88  88  --   BUN 25*  27*  --   CREATININE 1.63*  2.00* 1.64*  CALCIUM 10.0  --    Liver Function Tests:  Recent Labs Lab 09/19/13 1230  AST 20  ALT 22  ALKPHOS 95  BILITOT 1.2  PROT 7.7  ALBUMIN 4.5   No results found for this basename: LIPASE, AMYLASE,  in the last 168 hours No results found for this basename: AMMONIA,  in  the last 168 hours CBC:  Recent Labs Lab 09/19/13 1230 09/19/13 1935  WBC 7.2 7.8  NEUTROABS 4.7  --   HGB 14.7  16.0 12.7*  HCT 43.4  47.0 37.4*  MCV 88.9 89.3  PLT 116* 99*   Cardiac Enzymes:  Recent Labs Lab 09/19/13 1230  TROPONINI <0.30   BNP (last 3 results) No results found for this basename: PROBNP,  in the last 8760 hours CBG:  Recent Labs Lab 09/19/13 1205  GLUCAP 80    No results found for this or any previous visit (from the past 240 hour(s)).   Studies: No results found.  Scheduled Meds: .  atorvastatin  40 mg Oral Daily  . bicalutamide  50 mg Oral Daily  . cefTRIAXone (ROCEPHIN)  IV  1 g Intravenous Q24H  . clopidogrel  75 mg Oral Q breakfast  . doxycycline  100 mg Oral Q12H  . enoxaparin (LOVENOX) injection  40 mg Subcutaneous Q24H  . irbesartan  300 mg Oral Daily  . pantoprazole  40 mg Oral Daily   Continuous Infusions:   Active Problems:   Hypertension   CVA (cerebral infarction)   CAP (community acquired pneumonia)   CKD (chronic kidney disease), stage III    Time spent: >35 minutes    Penny Pia  [facility] Pager [phone]. If 7PM-7AM, please contact night-coverage at [email or web address], password [facility] 09/22/2013, 5:03 PM  LOS: 3 days

## 2013-09-23 DIAGNOSIS — I509 Heart failure, unspecified: Secondary | ICD-10-CM

## 2013-09-23 DIAGNOSIS — I4821 Permanent atrial fibrillation: Secondary | ICD-10-CM

## 2013-09-23 DIAGNOSIS — I4891 Unspecified atrial fibrillation: Secondary | ICD-10-CM

## 2013-09-23 DIAGNOSIS — I429 Cardiomyopathy, unspecified: Secondary | ICD-10-CM

## 2013-09-23 DIAGNOSIS — I5021 Acute systolic (congestive) heart failure: Secondary | ICD-10-CM

## 2013-09-23 HISTORY — DX: Cardiomyopathy, unspecified: I42.9

## 2013-09-23 HISTORY — DX: Unspecified atrial fibrillation: I48.91

## 2013-09-23 HISTORY — DX: Permanent atrial fibrillation: I48.21

## 2013-09-23 MED ORDER — CARVEDILOL 3.125 MG PO TABS
3.1250 mg | ORAL_TABLET | Freq: Two times a day (BID) | ORAL | Status: DC
  Administered 2013-09-23 – 2013-09-26 (×7): 3.125 mg via ORAL
  Filled 2013-09-23 (×8): qty 1

## 2013-09-23 MED ORDER — AMLODIPINE BESYLATE 5 MG PO TABS
5.0000 mg | ORAL_TABLET | Freq: Every day | ORAL | Status: DC
  Administered 2013-09-23 – 2013-09-26 (×4): 5 mg via ORAL
  Filled 2013-09-23 (×4): qty 1

## 2013-09-23 MED ORDER — DOXYCYCLINE HYCLATE 100 MG PO TABS
100.0000 mg | ORAL_TABLET | Freq: Two times a day (BID) | ORAL | Status: DC
  Administered 2013-09-23 – 2013-09-26 (×6): 100 mg via ORAL
  Filled 2013-09-23 (×8): qty 1

## 2013-09-23 MED ORDER — SODIUM CHLORIDE 0.9 % IV SOLN
INTRAVENOUS | Status: DC
  Administered 2013-09-25: 20 mL/h via INTRAVENOUS

## 2013-09-23 NOTE — Progress Notes (Signed)
[facility] PROGRESS NOTE  Matthew Mahoney ZOX:[id number] DOB: 1936/09/28 DOA: 09/19/2013 PCP: Willow Ora, MD  Assessment/Plan: 1. CVA - Plavix 75 mg started per neurology - MRI:5 mm acute infarct right occipital lobe. Acute infarct right frontal  parietal cortex and subcortical white matter. - ECHO: Mildly dilated LV with severe diffuse hypokinesis, EF 20-25%. Moderate diastolic dysfunction with evidence for elevated LV filling pressure. Normal RV size with mildly decreased systolic function. Mild MR. -Carotid dopplers:Bilateral: 1-39% ICA stenosis. Vertebral artery flow is antegrade - Speech therapy recommends dysphagia with honey thicken  -PT/OT recommends SNF  - TEE Pending.  Per neurology If positive for patent foramen oval ,  bilateral lower extremity venous dopplers to be done to rule out DVT as possible source of stroke. -If TEE negative, [place] cardiologist will place implantable loop recorder to evaluate for atrial fibrillation as etiology of stroke.  - TEE plans for Monday - Speech therapy evaluation  2. HTN Range sbp 139-190/ dbp 70-119 for 09/21/2013 Will continue to monitor - Add patient's ARB   3. Community acquired pneumonia -WJ:XBJYNW infiltrate is noted throughout the mid and lower right lung no sizable effusion is noted. -Afebrile - on rocephin and doxycycline (day 4) due to prolonged QT interval  - Given that patient has had trouble with swallowing and waited 3 days prior to presentation suspect patient most likely had aspiration pneumonia.  4. CKD Stage III -Creatinine 1.64 (09/20/13) which is patient's baseline -BMP in am    Code Status: Full  Family Communication: POC discussed with patient  Disposition Plan: pending neurology evaluation and  recommendations   Consultants:  Neurology  Procedures:  none  Antibiotics:  Rocephin started  09/19/2013  Doxycycline started 09/19/2013  HPI/Subjective: 77 year old male with history of  HTN, Aortic stenosis, COPD, CKD stage III who presented to ED with slurred speech and left sided facial droop for three days starting 09/16/2013.  MRI revealed 5 MM acute infract in right occipital lobe.   No new complaints at this juncture.  Objective: Filed Vitals:   09/23/13 1418  BP: 157/80  Pulse: 79  Temp: 98.3 F (36.8 C)  Resp: 20   No intake or output data in the 24 hours ending 09/23/13 1534 Filed Weights   09/19/13 1535  Weight: 83.598 kg (184 lb 4.8 oz)    Exam:   General: Pt in bed in NAD  Cardiovascular: S1 and S2  Present with no MRG. Radial and pedal pulses 2+ with no cyanosis or edema  Respiratory: regular unlabored breathing. Lungs CTA with no wheeze or rhonchi  Abdomen: symmetric soft, ND, NT . BS X4   Musculoskeletal: moves all extremities .   Neurological: left mouth droop with drooling. Tongue deviated to left. Normal finger to nose and heel to shin   Data Reviewed: Basic Metabolic Panel:  Recent Labs Lab 09/19/13 1230 09/19/13 1935  NA 138  140  --   K 4.1  3.9  --   CL 98  102  --   CO2 22  --   GLUCOSE 88  88  --   BUN 25*  27*  --   CREATININE 1.63*  2.00* 1.64*  CALCIUM 10.0  --    Liver Function Tests:  Recent Labs Lab 09/19/13 1230  AST 20  ALT 22  ALKPHOS 95  BILITOT 1.2  PROT 7.7  ALBUMIN 4.5   No results found for this basename: LIPASE, AMYLASE,  in the last 168 hours No results found  for this basename: AMMONIA,  in the last 168 hours CBC:  Recent Labs Lab 09/19/13 1230 09/19/13 1935  WBC 7.2 7.8  NEUTROABS 4.7  --   HGB 14.7  16.0 12.7*  HCT 43.4  47.0 37.4*  MCV 88.9 89.3  PLT 116* 99*   Cardiac Enzymes:  Recent Labs Lab 09/19/13 1230  TROPONINI <0.30   BNP (last 3 results) No results found for this basename: PROBNP,  in the last 8760 hours CBG:  Recent Labs Lab 09/19/13 1205  GLUCAP 80    No results found for this or any previous visit (from the past 240 hour(s)).   Studies: No  results found.  Scheduled Meds: . amLODipine  5 mg Oral Daily  . atorvastatin  40 mg Oral Daily  . bicalutamide  50 mg Oral Daily  . carvedilol  3.125 mg Oral BID WC  . clopidogrel  75 mg Oral Q breakfast  . doxycycline  100 mg Oral Q12H  . enoxaparin (LOVENOX) injection  40 mg Subcutaneous Q24H  . irbesartan  300 mg Oral Daily  . pantoprazole  40 mg Oral Daily   Continuous Infusions:   Active Problems:   Hypertension   CVA (cerebral infarction)   CAP (community acquired pneumonia)   CKD (chronic kidney disease), stage III   Atrial fibrillation   Secondary cardiomyopathy, unspecified    Time spent: >35 minutes    Penny Pia  [facility] Pager [phone]. If 7PM-7AM, please contact night-coverage at [email or web address], password [facility] 09/23/2013, 3:34 PM  LOS: 4 days

## 2013-09-23 NOTE — Progress Notes (Signed)
Stroke Team Progress Note  HISTORY Matthew Mahoney is a 77 y.o. male who was last seen normal on Saturday 09/16/13. He awoke on Sunday and noted he may have had a facial droop on the left. Later that day he was eating stew and noted he had a hard time keeping it in his mouth. He was brought to ED 10/14 for further evaluation. MRI brain was obtained and is positive for right parietal infarct. There is question of possible Afib en route with EMS. 12 lead obtained in hospital showed NSR. Currently patient is hooked up to telemetry and showing NSR. Patient was not a TPA candidate secondary to delay in arrival. He was admitted for further evaluation and treatment.  SUBJECTIVE There no family members present this morning. He notes some mild difficulty swallowing. He has a suction device which he uses quite frequently.  OBJECTIVE Most recent Vital Signs: Filed Vitals:   09/22/13 2126 09/23/13 0126 09/23/13 0539 09/23/13 0930  BP: 163/112 155/101 150/100 177/102  Pulse: 72 89 85 78  Temp: 98.5 F (36.9 C) 97.4 F (36.3 C) 98.5 F (36.9 C) 98.1 F (36.7 C)  TempSrc: Oral Oral Oral Oral  Resp: 18 18 20 28   Height:      Weight:      SpO2: 99% 98% 98% 94%   CBG (last 3)  No results found for this basename: GLUCAP,  in the last 72 hours  IV Fluid Intake:     MEDICATIONS  . atorvastatin  40 mg Oral Daily  . bicalutamide  50 mg Oral Daily  . carvedilol  3.125 mg Oral BID WC  . cefTRIAXone (ROCEPHIN)  IV  1 g Intravenous Q24H  . clopidogrel  75 mg Oral Q breakfast  . doxycycline  100 mg Oral Q12H  . enoxaparin (LOVENOX) injection  40 mg Subcutaneous Q24H  . irbesartan  300 mg Oral Daily  . pantoprazole  40 mg Oral Daily   PRN:  food thickener, senna-docusate  Diet:  Dysphagia  Activity:  As tolerated DVT Prophylaxis:  Lovenox 40 mg sq daily   CLINICALLY SIGNIFICANT STUDIES Basic Metabolic Panel:   Recent Labs Lab 09/19/13 1230 09/19/13 1935  NA 138  140  --   K 4.1  3.9  --    CL 98  102  --   CO2 22  --   GLUCOSE 88  88  --   BUN 25*  27*  --   CREATININE 1.63*  2.00* 1.64*  CALCIUM 10.0  --    Liver Function Tests:   Recent Labs Lab 09/19/13 1230  AST 20  ALT 22  ALKPHOS 95  BILITOT 1.2  PROT 7.7  ALBUMIN 4.5   CBC:   Recent Labs Lab 09/19/13 1230 09/19/13 1935  WBC 7.2 7.8  NEUTROABS 4.7  --   HGB 14.7  16.0 12.7*  HCT 43.4  47.0 37.4*  MCV 88.9 89.3  PLT 116* 99*   Coagulation:   Recent Labs Lab 09/19/13 1230  LABPROT 13.1  INR 1.01   Cardiac Enzymes:   Recent Labs Lab 09/19/13 1230  TROPONINI <0.30   Urinalysis:   Recent Labs Lab 09/19/13 1331  COLORURINE YELLOW  LABSPEC 1.016  PHURINE 5.0  GLUCOSEU NEGATIVE  HGBUR NEGATIVE  BILIRUBINUR SMALL*  KETONESUR 15*  PROTEINUR 30*  UROBILINOGEN 1.0  NITRITE NEGATIVE  LEUKOCYTESUR NEGATIVE   Lipid Panel    Component Value Date/Time   CHOL 120 09/20/2013 0500   TRIG 68 09/20/2013 0500  HDL 39* 09/20/2013 0500   CHOLHDL 3.1 09/20/2013 0500   VLDL 14 09/20/2013 0500   LDLCALC 67 09/20/2013 0500   HgbA1C  Lab Results  Component Value Date   HGBA1C 5.0 09/20/2013    Urine Drug Screen:     Component Value Date/Time   LABOPIA NONE DETECTED 09/19/2013 1331   COCAINSCRNUR NONE DETECTED 09/19/2013 1331   LABBENZ NONE DETECTED 09/19/2013 1331   AMPHETMU NONE DETECTED 09/19/2013 1331   THCU NONE DETECTED 09/19/2013 1331   LABBARB NONE DETECTED 09/19/2013 1331    Alcohol Level:   Recent Labs Lab 09/19/13 1230  ETH <11    CT of the brain  09/19/2013    No intracranial hemorrhage.  Prominent small vessel disease type changes without CT evidence of large acute infarct. Streak artifacts slightly limits evaluation of the posterior fossa structures.  Global atrophy without hydrocephalus.     MRI of the brain  09/19/2013    5 mm acute infarct right occipital lobe. Moderately large acute infarct right frontal parietal cortex and white matter.  Generalized  atrophy with chronic microvascular ischemic change.    MRA of the brain  09/19/2013    No significant intracranial stenosis.  2D Echocardiogram  EF 20-25%. Diffuse hypokinesis. Moderate diastolic dysfunction with evidence for elevated LV filling pressure. Normal RV size with mildly decreased systolic function. Mild MR.  TEE - pending  Carotid Doppler  No evidence of hemodynamically significant internal carotid artery stenosis. Vertebral artery flow is antegrade.   CXR  09/19/2013   Patchy right lung infiltrate.    EKG  normal sinus rhythm, baseline wander (see ECG for full results).   Therapy Recommendations SNF  Physical Exam   Pleasant elderly Caucasian male currently not in distress.Awake alert. Afebrile. Head is nontraumatic. Neck is supple without bruit. Hearing is normal. Cardiac exam no murmur or gallop. Lungs are clear to auscultation. Distal pulses are well felt. Neurological Exam :  Awake alert oriented x3 with no aphasia. Minimum dysarthria. Extraocular movements are full range without nystagmus. Blinks to threat bilaterally. Tongue deviates to the left. Moderate left lower facial weakness. No upper or lower extremity drift but fine finger movements are diminished on the left left grip is weak. Mild left hip flexor weakness. Orbits right over left upper extremity. Sensation appears preserved. Coordination is intact. Gait was not tested.  ASSESSMENT [name] is a 77 y.o. male presenting with left facial droop. Imaging confirms a right occipital and right frontal parietal cortical/white matter infarcts. Given multiple vascular distributions of the current strokes, infarcts initially felt to be  embolic secondary to presumed afib - though after review of EMS records atrial fib was not recognized. ECG here without afib. Infarct embolic due to unknown source pt. Patient was on no antithrombotics prior to admission. Now on clopidogrel 75 mg orally every day for secondary stroke  prevention. Work up underway.  Hypertension, home med resumed. Blood pressure is still high at times. Community acquired pneumonia, likely due to aspiration, on rocephin and doxycycline COPD CAD Hyperlipidemia, LDL 67, on lipitor 40 PTA, now on lipitor 40, goal LDL < 100 (< 70 for diabetics) aoritc stenosis  CKD, stage III, Cr 1.63-2.0  Hospital day # 4  TREATMENT/PLAN  Continue clopidogrel 75 mg orally every day for now for secondary stroke prevention. TEE to look for embolic source. Arranged with [facility] - possibly for Monday.  If positive for PFO (patent foramen ovale), check bilateral lower extremity venous dopplers to  rule out DVT as possible source of stroke. If TEE negative, [place] cardiologist will place implantable loop recorder to evaluate for atrial fibrillation as etiology of stroke. This has been explained to patient by Dr. Pearlean Brownie and he is agreeable. Show pt loop recorder video SNF vs CIR  Cardiology following. Possible conduction system delay.   Dr. Pearlean Brownie discussed diagnosis, prognosis,  treatment options and plan of care with pt and Dr. Cena Benton.    Delton See PA-C [facility] Pager [phone] 09/23/2013, 11:08 AM  I have personally obtained a history, examined the patient, evaluated imaging results, and formulated the assessment and plan of care. I agree with the above.  Lesly Dukes

## 2013-09-23 NOTE — Progress Notes (Signed)
Primary cardiologist: Dr. Delane Ginger  Subjective:   No chest pain or palpitations.   Objective:   Temp:  [97.4 F (36.3 C)-98.5 F (36.9 C)] 98.5 F (36.9 C) (10/18 0539) Pulse Rate:  [42-124] 85 (10/18 0539) Resp:  [13-31] 20 (10/18 0539) BP: (144-192)/(83-148) 150/100 mmHg (10/18 0539) SpO2:  [89 %-99 %] 98 % (10/18 0539) Last BM Date: 09/19/13  Filed Weights   09/19/13 1535  Weight: 184 lb 4.8 oz (83.598 kg)   No intake or output data in the 24 hours ending 09/23/13 0853  Telemetry: Has been in sinus rhythm with prolonged PR and IVCD, also increased atrial runs, currently tachycardic in the 120s with bursts of what could potentially be atrial fibrillation, however regularity does not rule out a reentrant mechanism.  Exam:  General: Chronically ill-appearing, no distress.  Lungs: Course, diminished breath sounds.  Cardiac: Irregular, no gallop.  Abdomen: NABS.  Extremities: No pitting.  Lab Results:  Basic Metabolic Panel:  Recent Labs Lab 09/19/13 1230 09/19/13 1935  NA 138  140  --   K 4.1  3.9  --   CL 98  102  --   CO2 22  --   GLUCOSE 88  88  --   BUN 25*  27*  --   CREATININE 1.63*  2.00* 1.64*  CALCIUM 10.0  --     Liver Function Tests:  Recent Labs Lab 09/19/13 1230  AST 20  ALT 22  ALKPHOS 95  BILITOT 1.2  PROT 7.7  ALBUMIN 4.5    CBC:  Recent Labs Lab 09/19/13 1230 09/19/13 1935  WBC 7.2 7.8  HGB 14.7  16.0 12.7*  HCT 43.4  47.0 37.4*  MCV 88.9 89.3  PLT 116* 99*    Cardiac Enzymes:  Recent Labs Lab 09/19/13 1230  TROPONINI <0.30    Medications:   Scheduled Medications: . atorvastatin  40 mg Oral Daily  . bicalutamide  50 mg Oral Daily  . cefTRIAXone (ROCEPHIN)  IV  1 g Intravenous Q24H  . clopidogrel  75 mg Oral Q breakfast  . doxycycline  100 mg Oral Q12H  . enoxaparin (LOVENOX) injection  40 mg Subcutaneous Q24H  . irbesartan  300 mg Oral Daily  . pantoprazole  40 mg Oral Daily     PRN  Medications:  food thickener, senna-docusate   Assessment:   1. Recent stroke, right occipital lobe, right frontal parietal cortex, subcortical white matter. Patient has associated cardiomyopathy by transthoracic echocardiogram, question of dysrhythmia. He was not able to tolerate TEE yesterday, please see Dr. Charlott Rakes note. Recommendation is to reschedule the procedure with anesthesia backup.  2. Evidence of conduction system disease as outlined by Dr. Johney Frame, also intermittent dysrhythmia. He has recently been in sinus rhythm, however review of telemetry over the last several hours finds bursts of atrial activity, cannot exclude atrial fibrillation, however the relative regularity also raises possible reentrant mechanism or an atypical flutter.  3. Community-acquired pneumonia. Currently on antibiotics.  4. CKD stage III.   Plan/Discussion:    I have communicated with Bjorn Loser to see if we can get a TEE with anesthesia backup scheduled for Monday - not certain if scheduled by Dr. Tenny Craw or not. Question of potential paroxysmal atrial arrhythmia still remains based on review of his recent telemetry strips. Repeat ECG. Would ask for repeat EP input on this Monday as well. In the short run, willl cautiously try Coreg 3.125 mg twice daily. He does have evidence of conduction  system disease and will need to be watched carefully for significant bradycardia or heart block.   Jonelle Sidle, M.D., F.A.C.C.

## 2013-09-23 NOTE — Progress Notes (Addendum)
TEE to be scheduled for Tuesday 10/21. Orders written.

## 2013-09-23 NOTE — Progress Notes (Signed)
   CARE MANAGEMENT NOTE 09/23/2013  Patient:  Matthew Mahoney, Matthew Mahoney   Account Number:  [id number]  Date Initiated:  09/20/2013  Documentation initiated by:  Jiles Crocker  Subjective/Objective Assessment:   ADMITTED FOR CVA     Action/Plan:   LIVES AT HOME ALONE  PCP IS DR CAMILLE ANDY  CM FOLLOWING FOR DCP   Anticipated DC Date:  09/25/2013   Anticipated DC Plan:  HOME/SELF CARE  In-house referral  Clinical Social Worker      DC Planning Services  CM consult      Choice offered to / List presented to:             Status of service:  Completed, signed off Medicare Important Message given?  NA - LOS <3 / Initial given by admissions (If response is "NO", the following Medicare IM given date fields will be blank) Date Medicare IM given:   Date Additional Medicare IM given:    Discharge Disposition:  HOME/SELF CARE  Per UR Regulation:  Reviewed for med. necessity/level of care/duration of stay  If discussed at Long Length of Stay Meetings, dates discussed:    Comments:  09/23/13 11:45 CM spoke to pt in room concerning medication cost and discharge planning.  Pt asked I call his companion , Fannie Knee at [phone] and speak to her.  Fannie Knee states pt has Medicare and his prescriptions can pay for his prescriptions but was hoping to get them "included in his hopsital stay." I explained pt needed to get his prescriptions filled at the pharmacy.  A 75% prescription savings card was given.  No other CM needs were communicated, Freddy Jaksch, BSN, [place] [phone].     10/15/2014Abelino Derrick RN,BSN,MHA [phone]

## 2013-09-24 DIAGNOSIS — E785 Hyperlipidemia, unspecified: Secondary | ICD-10-CM

## 2013-09-24 LAB — CBC
HCT: 40.6 % (ref 39.0–52.0)
Hemoglobin: 14 g/dL (ref 13.0–17.0)
MCH: 30.4 pg (ref 26.0–34.0)
MCHC: 34.5 g/dL (ref 30.0–36.0)
MCV: 88.3 fL (ref 78.0–100.0)
WBC: 8.7 10*3/uL (ref 4.0–10.5)

## 2013-09-24 NOTE — Progress Notes (Signed)
     Primary cardiologist: Dr. Delane Ginger  Subjective:   No chest pain or palpitations.   Objective:   Temp:  [97.7 F (36.5 C)-98.5 F (36.9 C)] 98 F (36.7 C) (10/19 0600) Pulse Rate:  [67-92] 67 (10/19 0600) Resp:  [20-28] 22 (10/19 0600) BP: (130-177)/(73-105) 155/96 mmHg (10/19 0600) SpO2:  [94 %-99 %] 97 % (10/19 0600) Last BM Date: 09/19/13  Filed Weights   09/19/13 1535  Weight: 184 lb 4.8 oz (83.598 kg)    Intake/Output Summary (Last 24 hours) at 09/24/13 0825 Last data filed at 09/23/13 2001  Gross per 24 hour  Intake      0 ml  Output    300 ml  Net   -300 ml    Telemetry: Currently irregular and consistent with atrial fibrillation.  Exam:  General: Chronically ill-appearing, no distress.  Lungs: Course, diminished breath sounds.  Cardiac: Irregular, no gallop.  Abdomen: NABS.  Extremities: No pitting.  Lab Results:  Basic Metabolic Panel:  Recent Labs Lab 09/19/13 1230 09/19/13 1935  NA 138  140  --   K 4.1  3.9  --   CL 98  102  --   CO2 22  --   GLUCOSE 88  88  --   BUN 25*  27*  --   CREATININE 1.63*  2.00* 1.64*  CALCIUM 10.0  --     Liver Function Tests:  Recent Labs Lab 09/19/13 1230  AST 20  ALT 22  ALKPHOS 95  BILITOT 1.2  PROT 7.7  ALBUMIN 4.5    CBC:  Recent Labs Lab 09/19/13 1230 09/19/13 1935  WBC 7.2 7.8  HGB 14.7  16.0 12.7*  HCT 43.4  47.0 37.4*  MCV 88.9 89.3  PLT 116* 99*    Cardiac Enzymes:  Recent Labs Lab 09/19/13 1230  TROPONINI <0.30    Medications:   Scheduled Medications: . amLODipine  5 mg Oral Daily  . atorvastatin  40 mg Oral Daily  . bicalutamide  50 mg Oral Daily  . carvedilol  3.125 mg Oral BID WC  . clopidogrel  75 mg Oral Q breakfast  . doxycycline  100 mg Oral Q12H  . enoxaparin (LOVENOX) injection  40 mg Subcutaneous Q24H  . irbesartan  300 mg Oral Daily  . pantoprazole  40 mg Oral Daily    PRN Medications: food thickener,  senna-docusate   Assessment:   1. Recent stroke, right occipital lobe, right frontal parietal cortex, subcortical white matter. Patient has associated cardiomyopathy by transthoracic echocardiogram, question of dysrhythmia. He was not able to tolerate TEE on Friday, please see Dr. Charlott Rakes note. Recommendation is to reschedule the procedure with anesthesia backup.  2. Evidence of conduction system disease as outlined by Dr. Johney Frame, also intermittent dysrhythmia. He has recently been in sinus rhythm, however review of telemetry currently shows irregularity suggestive atrial fibrillation or even atypical atrial flutter. Yesterday's ECG showed sinus rhythm with bursts of atrial activity.  3. Community-acquired pneumonia. Currently on antibiotics.  4. CKD stage III.   Plan/Discussion:    Plan TEE with anesthesia backup for Monday (discussed with Rhonda to get on schedule). Question of potential paroxysmal atrial arrhythmia still remains based on review of his recent telemetry strips. Repeat ECG today.He has tolerated Coreg 3.125 mg twice daily.    Jonelle Sidle, M.D., F.A.C.C.

## 2013-09-24 NOTE — Progress Notes (Signed)
[facility] PROGRESS NOTE  Matthew Mahoney NWG:[id number] DOB: 1935/12/24 DOA: 09/19/2013 PCP: Willow Ora, MD  Assessment/Plan: 1. CVA - Plavix 75 mg started per neurology - MRI:5 mm acute infarct right occipital lobe. Acute infarct right frontal  parietal cortex and subcortical white matter. - ECHO: Mildly dilated LV with severe diffuse hypokinesis, EF 20-25%. Moderate diastolic dysfunction with evidence for elevated LV filling pressure. Normal RV size with mildly decreased systolic function. Mild MR. -Carotid dopplers:Bilateral: 1-39% ICA stenosis. Vertebral artery flow is antegrade - Speech therapy recommends dysphagia with honey thicken  -PT/OT recommends SNF  - TEE Pending.  Per neurology If positive for patent foramen oval ,  bilateral lower extremity venous dopplers to be done to rule out DVT as possible source of stroke. -If TEE negative, [place] cardiologist will place implantable loop recorder to evaluate for atrial fibrillation as etiology of stroke.  - TEE plans for Monday with anesthesia back up - Speech therapy evaluation  2. HTN Range sbp 139-190/ dbp 70-119 for 09/21/2013 Will continue to monitor - Add patient's ARB   3. Community acquired pneumonia -VH:QIONGE infiltrate is noted throughout the mid and lower right lung no sizable effusion is noted. -Afebrile - doxycycline (day 5) due to prolonged QT interval  - Given that patient has had trouble with swallowing and waited 3 days prior to presentation suspect patient most likely had aspiration pneumonia.  4. CKD Stage III -Creatinine 1.64 (09/20/13) which is patient's baseline -BMP in am    Code Status: Full  Family Communication: POC discussed with patient  Disposition Plan: pending neurology evaluation and  recommendations   Consultants:  Neurology  Procedures:  none  Antibiotics:  Rocephin started  09/19/2013  Doxycycline started 09/19/2013  HPI/Subjective: 77 year old male with  history of HTN, Aortic stenosis, COPD, CKD stage III who presented to ED with slurred speech and left sided facial droop for three days starting 09/16/2013.  MRI revealed 5 MM acute infarct in right occipital lobe.   No new complaints at this juncture. No acute issues reported to me overnight.  Objective: Filed Vitals:   09/24/13 1000  BP: 153/85  Pulse:   Temp:   Resp:     Intake/Output Summary (Last 24 hours) at 09/24/13 1156 Last data filed at 09/24/13 1156  Gross per 24 hour  Intake      0 ml  Output    300 ml  Net   -300 ml   Filed Weights   09/19/13 1535  Weight: 83.598 kg (184 lb 4.8 oz)    Exam:   General: Pt in bed in NAD  Cardiovascular: S1 and S2  Present with no MRG. Radial and pedal pulses 2+ with no cyanosis or edema  Respiratory: regular unlabored breathing. Lungs CTA with no wheeze or rhonchi  Abdomen: symmetric soft, ND, NT . BS X4   Musculoskeletal: moves all extremities .   Neurological: left mouth droop with drooling. Tongue deviated to left. Normal finger to nose and heel to shin   Data Reviewed: Basic Metabolic Panel:  Recent Labs Lab 09/19/13 1230 09/19/13 1935  NA 138  140  --   K 4.1  3.9  --   CL 98  102  --   CO2 22  --   GLUCOSE 88  88  --   BUN 25*  27*  --   CREATININE 1.63*  2.00* 1.64*  CALCIUM 10.0  --    Liver Function Tests:  Recent Labs Lab 09/19/13 1230  AST 20  ALT 22  ALKPHOS 95  BILITOT 1.2  PROT 7.7  ALBUMIN 4.5   No results found for this basename: LIPASE, AMYLASE,  in the last 168 hours No results found for this basename: AMMONIA,  in the last 168 hours CBC:  Recent Labs Lab 09/19/13 1230 09/19/13 1935  WBC 7.2 7.8  NEUTROABS 4.7  --   HGB 14.7  16.0 12.7*  HCT 43.4  47.0 37.4*  MCV 88.9 89.3  PLT 116* 99*   Cardiac Enzymes:  Recent Labs Lab 09/19/13 1230  TROPONINI <0.30   BNP (last 3 results) No results found for this basename: PROBNP,  in the last 8760 hours CBG:  Recent  Labs Lab 09/19/13 1205  GLUCAP 80    No results found for this or any previous visit (from the past 240 hour(s)).   Studies: No results found.  Scheduled Meds: . amLODipine  5 mg Oral Daily  . atorvastatin  40 mg Oral Daily  . bicalutamide  50 mg Oral Daily  . carvedilol  3.125 mg Oral BID WC  . clopidogrel  75 mg Oral Q breakfast  . doxycycline  100 mg Oral Q12H  . enoxaparin (LOVENOX) injection  40 mg Subcutaneous Q24H  . irbesartan  300 mg Oral Daily  . pantoprazole  40 mg Oral Daily   Continuous Infusions: . [START ON 09/25/2013] sodium chloride      Active Problems:   Hypertension   CVA (cerebral infarction)   CAP (community acquired pneumonia)   CKD (chronic kidney disease), stage III   Atrial fibrillation   Secondary cardiomyopathy, unspecified    Time spent: >35 minutes    Penny Pia  [facility] Pager [phone]. If 7PM-7AM, please contact night-coverage at [email or web address], password [facility] 09/24/2013, 11:56 AM  LOS: 5 days

## 2013-09-24 NOTE — Progress Notes (Addendum)
Stroke Team Progress Note  HISTORY Matthew Mahoney is a 77 y.o. male who was last seen normal on Saturday 09/16/13. He awoke on Sunday and noted he may have had a facial droop on the left. Later that day he was eating stew and noted he had a hard time keeping it in his mouth. He was brought to ED 10/14 for further evaluation. MRI brain was obtained and is positive for right parietal infarct. There is question of possible Afib en route with EMS. 12 lead obtained in hospital showed NSR. Currently patient is hooked up to telemetry and showing NSR. Patient was not a TPA candidate secondary to delay in arrival. He was admitted for further evaluation and treatment.  SUBJECTIVE No family members present. The patient reports no complaints. We briefly discussed the TEE planned for tomorrow. Questions answered.  OBJECTIVE Most recent Vital Signs: Filed Vitals:   09/24/13 0200 09/24/13 0600 09/24/13 0959 09/24/13 1000  BP: 169/100 155/96 157/117 153/85  Pulse: 71 67 74   Temp: 98.1 F (36.7 C) 98 F (36.7 C) 98.7 F (37.1 C)   TempSrc:   Oral   Resp: 22 22 21    Height:      Weight:      SpO2: 97% 97% 100%    CBG (last 3)  No results found for this basename: GLUCAP,  in the last 72 hours  IV Fluid Intake:   . [START ON 09/25/2013] sodium chloride      MEDICATIONS  . amLODipine  5 mg Oral Daily  . atorvastatin  40 mg Oral Daily  . bicalutamide  50 mg Oral Daily  . carvedilol  3.125 mg Oral BID WC  . clopidogrel  75 mg Oral Q breakfast  . doxycycline  100 mg Oral Q12H  . enoxaparin (LOVENOX) injection  40 mg Subcutaneous Q24H  . irbesartan  300 mg Oral Daily  . pantoprazole  40 mg Oral Daily   PRN:  food thickener, senna-docusate  Diet:  Dysphagia  Activity:  As tolerated DVT Prophylaxis:  Lovenox 40 mg sq daily   CLINICALLY SIGNIFICANT STUDIES Basic Metabolic Panel:   Recent Labs Lab 09/19/13 1230 09/19/13 1935  NA 138  140  --   K 4.1  3.9  --   CL 98  102  --   CO2 22   --   GLUCOSE 88  88  --   BUN 25*  27*  --   CREATININE 1.63*  2.00* 1.64*  CALCIUM 10.0  --    Liver Function Tests:   Recent Labs Lab 09/19/13 1230  AST 20  ALT 22  ALKPHOS 95  BILITOT 1.2  PROT 7.7  ALBUMIN 4.5   CBC:   Recent Labs Lab 09/19/13 1230 09/19/13 1935  WBC 7.2 7.8  NEUTROABS 4.7  --   HGB 14.7  16.0 12.7*  HCT 43.4  47.0 37.4*  MCV 88.9 89.3  PLT 116* 99*   Coagulation:   Recent Labs Lab 09/19/13 1230  LABPROT 13.1  INR 1.01   Cardiac Enzymes:   Recent Labs Lab 09/19/13 1230  TROPONINI <0.30   Urinalysis:   Recent Labs Lab 09/19/13 1331  COLORURINE YELLOW  LABSPEC 1.016  PHURINE 5.0  GLUCOSEU NEGATIVE  HGBUR NEGATIVE  BILIRUBINUR SMALL*  KETONESUR 15*  PROTEINUR 30*  UROBILINOGEN 1.0  NITRITE NEGATIVE  LEUKOCYTESUR NEGATIVE   Lipid Panel    Component Value Date/Time   CHOL 120 09/20/2013 0500   TRIG 68 09/20/2013 0500  HDL 39* 09/20/2013 0500   CHOLHDL 3.1 09/20/2013 0500   VLDL 14 09/20/2013 0500   LDLCALC 67 09/20/2013 0500   HgbA1C  Lab Results  Component Value Date   HGBA1C 5.0 09/20/2013    Urine Drug Screen:     Component Value Date/Time   LABOPIA NONE DETECTED 09/19/2013 1331   COCAINSCRNUR NONE DETECTED 09/19/2013 1331   LABBENZ NONE DETECTED 09/19/2013 1331   AMPHETMU NONE DETECTED 09/19/2013 1331   THCU NONE DETECTED 09/19/2013 1331   LABBARB NONE DETECTED 09/19/2013 1331    Alcohol Level:   Recent Labs Lab 09/19/13 1230  ETH <11    CT of the brain  09/19/2013    No intracranial hemorrhage.  Prominent small vessel disease type changes without CT evidence of large acute infarct. Streak artifacts slightly limits evaluation of the posterior fossa structures.  Global atrophy without hydrocephalus.     MRI of the brain  09/19/2013    5 mm acute infarct right occipital lobe. Moderately large acute infarct right frontal parietal cortex and white matter.  Generalized atrophy with chronic  microvascular ischemic change.    MRA of the brain  09/19/2013    No significant intracranial stenosis.  2D Echocardiogram  EF 20-25%. Diffuse hypokinesis. Moderate diastolic dysfunction with evidence for elevated LV filling pressure. Normal RV size with mildly decreased systolic function. Mild MR.  TEE - pending  Carotid Doppler  No evidence of hemodynamically significant internal carotid artery stenosis. Vertebral artery flow is antegrade.   CXR  09/19/2013   Patchy right lung infiltrate.    EKG  normal sinus rhythm, baseline wander (see ECG for full results).   Therapy Recommendations - SNF  Physical Exam   Pleasant elderly Caucasian male currently not in distress.Awake alert. Afebrile. Head is nontraumatic. Neck is supple without bruit. Hearing is normal. Cardiac exam no murmur or gallop. Lungs are clear to auscultation. Distal pulses are well felt. Neurological Exam :  Awake alert oriented x3 with no aphasia. Minimum dysarthria. Extraocular movements are full range without nystagmus. Blinks to threat bilaterally. Tongue deviates to the left. Moderate left lower facial weakness. No upper or lower extremity drift but fine finger movements are diminished on the left left grip is weak. Mild left hip flexor weakness. Orbits right over left upper extremity. Sensation appears preserved. Coordination is intact. Gait was not tested.  ASSESSMENT [name] is a 77 y.o. male presenting with left facial droop. Imaging confirms a right occipital and right frontal parietal cortical/white matter infarcts. Given multiple vascular distributions of the current strokes, infarcts initially felt to be  embolic secondary to presumed afib - though after review of EMS records atrial fib was not recognized. ECG here without afib. Infarct embolic due to unknown source pt. Patient was on no antithrombotics prior to admission. Now on clopidogrel 75 mg orally every day for secondary stroke prevention. Work up  underway.  Hypertension, home med resumed. Blood pressure is still high at times. Community acquired pneumonia, likely due to aspiration, on rocephin and doxycycline COPD CAD Hyperlipidemia, LDL 67, on lipitor 40 PTA, now on lipitor 40, goal LDL < 100 (< 70 for diabetics) aoritc stenosis  CKD, stage III, Cr 1.63-2.0  Hospital day # 5  TREATMENT/PLAN  Continue clopidogrel 75 mg orally every day for now for secondary stroke prevention. TEE to look for embolic source. Arranged with  Cardiology - planned for Monday with anesthesia backup.  If positive for PFO (patent foramen ovale), check bilateral lower  extremity venous dopplers to rule out DVT as possible source of stroke. If TEE negative, [place] cardiologist will place implantable loop recorder to evaluate for atrial fibrillation as etiology of stroke. This has been explained to patient by Dr. Pearlean Brownie and he is agreeable. Show pt loop recorder video SNF vs CIR, PT to see Cardiology following. Possible conduction system delay. Possible paroxysmal atrial fibrillation.   Delton See PA-C [facility] Pager [phone] 09/24/2013, 10:04 AM  I have personally obtained a history, examined the patient, evaluated imaging results, and formulated the assessment and plan of care. I agree with the above.   Lesly Dukes

## 2013-09-25 ENCOUNTER — Encounter (HOSPITAL_COMMUNITY): Payer: Self-pay | Admitting: Physician Assistant

## 2013-09-25 ENCOUNTER — Other Ambulatory Visit: Payer: Self-pay

## 2013-09-25 DIAGNOSIS — I4892 Unspecified atrial flutter: Secondary | ICD-10-CM

## 2013-09-25 DIAGNOSIS — D696 Thrombocytopenia, unspecified: Secondary | ICD-10-CM

## 2013-09-25 DIAGNOSIS — K117 Disturbances of salivary secretion: Secondary | ICD-10-CM

## 2013-09-25 DIAGNOSIS — I459 Conduction disorder, unspecified: Secondary | ICD-10-CM

## 2013-09-25 DIAGNOSIS — F1010 Alcohol abuse, uncomplicated: Secondary | ICD-10-CM

## 2013-09-25 HISTORY — DX: Unspecified atrial flutter: I48.92

## 2013-09-25 HISTORY — DX: Thrombocytopenia, unspecified: D69.6

## 2013-09-25 HISTORY — DX: Conduction disorder, unspecified: I45.9

## 2013-09-25 LAB — BASIC METABOLIC PANEL
BUN: 21 mg/dL (ref 6–23)
CO2: 28 mEq/L (ref 19–32)
Calcium: 9.4 mg/dL (ref 8.4–10.5)
Chloride: 100 mEq/L (ref 96–112)
GFR calc Af Amer: 59 mL/min — ABNORMAL LOW (ref >90)
GFR calc non Af Amer: 51 mL/min — ABNORMAL LOW (ref >90)
Glucose, Bld: 103 mg/dL — ABNORMAL HIGH (ref 70–99)
Potassium: 3 mEq/L — ABNORMAL LOW (ref 3.5–5.1)

## 2013-09-25 MED ORDER — DOXYCYCLINE HYCLATE 100 MG PO TABS: 100.0000 mg | ORAL_TABLET | Freq: Two times a day (BID) | ORAL | Status: DC

## 2013-09-25 MED ORDER — AMLODIPINE BESYLATE 10 MG PO TABS: 10.0000 mg | ORAL_TABLET | Freq: Every day | ORAL | Status: DC

## 2013-09-25 MED ORDER — MAGNESIUM SULFATE 40 MG/ML IJ SOLN
2.0000 g | Freq: Once | INTRAMUSCULAR | Status: AC
  Administered 2013-09-25: 2 g via INTRAVENOUS
  Filled 2013-09-25: qty 50

## 2013-09-25 MED ORDER — POTASSIUM CHLORIDE CRYS ER 20 MEQ PO TBCR
40.0000 meq | EXTENDED_RELEASE_TABLET | Freq: Once | ORAL | Status: AC
  Administered 2013-09-25: 40 meq via ORAL
  Filled 2013-09-25: qty 2

## 2013-09-25 MED ORDER — CARVEDILOL 3.125 MG PO TABS: 3.1250 mg | ORAL_TABLET | Freq: Two times a day (BID) | ORAL | Status: DC

## 2013-09-25 MED ORDER — APIXABAN 2.5 MG PO TABS: 2.5000 mg | ORAL_TABLET | Freq: Two times a day (BID) | ORAL | Status: DC

## 2013-09-25 NOTE — Progress Notes (Addendum)
Patient: Matthew Mahoney / Admit Date: 09/19/2013 / Date of Encounter: 09/25/2013, 7:14 AM   Subjective  Denies CP, SOB, or awareness of irregular heartbeat. Still drinking 3-4 drinks of liquor daily. No recent falls. Lives alone. Per nursing, required 2 person assist. He pulled out his IV yesterday and had a hard time stopping the bleeding due to Plavix & Lovenox.  Objective   Telemetry: atrial flutter with mostly controlled ventricular rate, occasional bradycardia nonsustained into the 40's, occasional PVCs, no pauses over 2 seconds  Physical Exam: Blood pressure 161/84, pulse 85, temperature 97.9 F (36.6 C), temperature source Oral, resp. rate 18, height 6' (1.829 m), weight 184 lb 4.8 oz (83.598 kg), SpO2 97.00%. General: Well developed WM in no acute distress. Head: Normocephalic, atraumatic, sclera non-icteric, no xanthomas, nares are without discharge. Neck: JVP not elevated. Lungs: Generally diminished BS throughout but no wheezes, rales, or rhonchi. Breathing is unlabored. Heart: Irregularly irregular, S1 S2 without murmurs, rubs, or gallops.  Abdomen: Soft, non-tender, non-distended with normoactive bowel sounds. No rebound/guarding. Extremities: No clubbing or cyanosis. No edema. Distal pedal pulses are 2+ and equal bilaterally. Neuro: Alert and oriented to self, date. Thought this was Claris Gower [place] at [facility]. Follows commands. Somewhat slow thought process. Psych:  Responds to questions appropriately with a normal affect.   Intake/Output Summary (Last 24 hours) at 09/25/13 0714 Last data filed at 09/24/13 1156  Gross per 24 hour  Intake      0 ml  Output    100 ml  Net   -100 ml    Inpatient Medications:  . amLODipine  5 mg Oral Daily  . atorvastatin  40 mg Oral Daily  . bicalutamide  50 mg Oral Daily  . carvedilol  3.125 mg Oral BID WC  . clopidogrel  75 mg Oral Q breakfast  . doxycycline  100 mg Oral Q12H  . enoxaparin (LOVENOX) injection  40 mg  Subcutaneous Q24H  . irbesartan  300 mg Oral Daily  . pantoprazole  40 mg Oral Daily   Infusions:  . sodium chloride 20 mL/hr (09/25/13 0031)    Labs: Results for ARDIAN, [place] (MRN [id number]) as of 09/25/2013 07:17  Ref. Range 09/19/2013 12:30  Sodium Latest Range: 135-145 mEq/L 138  Potassium Latest Range: 3.5-5.1 mEq/L 4.1  Chloride Latest Range: 96-112 mEq/L 98  CO2 Latest Range: 19-32 mEq/L 22  BUN Latest Range: 6-23 mg/dL 25 (H)  Creatinine Latest Range: 0.50-1.35 mg/dL 1.19 (H)  Calcium Latest Range: 8.4-10.5 mg/dL 14.7  GFR calc non Af Amer Latest Range: >90 mL/min 40 (L)  GFR calc Af Amer Latest Range: >90 mL/min 46 (L)  Glucose Latest Range: 70-99 mg/dL 88  Alkaline Phosphatase Latest Range: 39-117 U/L 95  Albumin Latest Range: 3.5-5.2 g/dL 4.5  AST Latest Range: 0-37 U/L 20  ALT Latest Range: 0-53 U/L 22  Total Protein Latest Range: 6.0-8.3 g/dL 7.7  Total Bilirubin Latest Range: 0.3-1.2 mg/dL 1.2     Recent Labs  82/95/62 1523  WBC 8.7  HGB 14.0  HCT 40.6  MCV 88.3  PLT 115*     Radiology/Studies:  Dg Chest 2 View  09/19/2013   CLINICAL DATA:  Chest pain and congestion  EXAM: CHEST  2 VIEW  COMPARISON:  03/16/2013  FINDINGS: Cardiac shadow remains enlarged. Patchy infiltrate is noted throughout the mid and lower right lung no sizable effusion is noted. No acute bony abnormality is seen.  IMPRESSION: Patchy right lung infiltrate.   Electronically  Signed   By: Alcide Clever M.D.   On: 09/19/2013 12:38   Ct Head Wo Contrast  09/19/2013   CLINICAL DATA:  Left-sided facial droop. Slurred speech. Hypertension. Hyperlipidemia.  EXAM: CT HEAD WITHOUT CONTRAST  TECHNIQUE: Contiguous axial images were obtained from the base of the skull through the vertex without intravenous contrast.  COMPARISON:  01/20/2013.  FINDINGS: No intracranial hemorrhage.  Prominent small vessel disease type changes without CT evidence of large acute infarct. Streak artifacts slightly  limits evaluation of the posterior fossa structures.  Global atrophy without hydrocephalus.  No intracranial mass lesion noted on this unenhanced exam.  Vascular calcifications.  Dental disease.  IMPRESSION: No intracranial hemorrhage.  Prominent small vessel disease type changes without CT evidence of large acute infarct. Streak artifacts slightly limits evaluation of the posterior fossa structures.  Global atrophy without hydrocephalus.   Electronically Signed   By: Bridgett Larsson M.D.   On: 09/19/2013 12:54   Mr Maxine Glenn Head Wo Contrast  09/19/2013   CLINICAL DATA:  Left facial droop. Slurred speech. Stroke.  EXAM: MRI HEAD WITHOUT CONTRAST  MRA HEAD WITHOUT CONTRAST  TECHNIQUE: Multiplanar, multiecho pulse sequences of the brain and surrounding structures were obtained without intravenous contrast. Angiographic images of the head were obtained using MRA technique without contrast.  COMPARISON:  CT 09/19/2013. MRI 04/29/2013  FINDINGS: MRI HEAD FINDINGS  5 mm acute infarct right occipital lobe. Acute infarct right frontal parietal cortex and subcortical white matter. No other areas of acute infarction.  Generalized atrophy. Chronic microvascular ischemic change throughout the white matter bilaterally.  Negative for hemorrhage or mass. No midline shift. Paranasal sinuses are clear.  MRA HEAD FINDINGS  Both vertebral arteries are patent to the basilar. Right PICA is patent. Left PICA not visualized. Left AICA is patent. Right AICA not visualized. Superior cerebellar and posterior cerebral arteries are patent bilaterally without significant stenosis.  Mild atherosclerotic disease in the left cavernous carotid which is patent without significant stenosis. Left anterior and middle cerebral arteries are patent.  Right cavernous carotid is widely patent. Right anterior and middle cerebral arteries are widely patent.  Negative for cerebral aneurysm.  IMPRESSION: MRI HEAD IMPRESSION  5 mm acute infarct right occipital lobe.  Moderately large acute infarct right frontal parietal cortex and white matter.  Generalized atrophy with chronic microvascular ischemic change.  MRA HEAD IMPRESSION  No significant intracranial stenosis.   Electronically Signed   By: Marlan Palau M.D.   On: 09/19/2013 15:49   Mr Brain Wo Contrast  09/19/2013   CLINICAL DATA:  Left facial droop. Slurred speech. Stroke.  EXAM: MRI HEAD WITHOUT CONTRAST  MRA HEAD WITHOUT CONTRAST  TECHNIQUE: Multiplanar, multiecho pulse sequences of the brain and surrounding structures were obtained without intravenous contrast. Angiographic images of the head were obtained using MRA technique without contrast.  COMPARISON:  CT 09/19/2013. MRI 04/29/2013  FINDINGS: MRI HEAD FINDINGS  5 mm acute infarct right occipital lobe. Acute infarct right frontal parietal cortex and subcortical white matter. No other areas of acute infarction.  Generalized atrophy. Chronic microvascular ischemic change throughout the white matter bilaterally.  Negative for hemorrhage or mass. No midline shift. Paranasal sinuses are clear.  MRA HEAD FINDINGS  Both vertebral arteries are patent to the basilar. Right PICA is patent. Left PICA not visualized. Left AICA is patent. Right AICA not visualized. Superior cerebellar and posterior cerebral arteries are patent bilaterally without significant stenosis.  Mild atherosclerotic disease in the left cavernous carotid which is patent  without significant stenosis. Left anterior and middle cerebral arteries are patent.  Right cavernous carotid is widely patent. Right anterior and middle cerebral arteries are widely patent.  Negative for cerebral aneurysm.  IMPRESSION: MRI HEAD IMPRESSION  5 mm acute infarct right occipital lobe. Moderately large acute infarct right frontal parietal cortex and white matter.  Generalized atrophy with chronic microvascular ischemic change.  MRA HEAD IMPRESSION  No significant intracranial stenosis.   Electronically Signed   By:  Marlan Palau M.D.   On: 09/19/2013 15:49   Dg Swallowing Func-speech Pathology  09/20/2013   Leah Meryl McCoy, CCC-SLP     09/20/2013 11:09 AM Objective Swallowing Evaluation: Modified Barium Swallowing Study   Patient Details  Name: STAVROS CAIL MRN: [id number] Date of Birth: 1936-08-30  Today's Date: 09/20/2013 Time: 1035-1056 SLP Time Calculation (min): 21 min  Past Medical History:  Past Medical History  Diagnosis Date  . Hypertension   . Edema of leg   . COPD (chronic obstructive pulmonary disease)   . Choledocholithiasis   . Coronary arteriosclerosis   . Hyperlipidemia   . Ichthyosis   . Mitral regurgitation   . Onychomycosis   . Vitamin D deficiency   . Aortic stenosis   . Hyperkalemia   . Hypocalcemia   . Lymphadenopathy   . Prostate cancer   . Status post lung surgery 12/07/2001    BENIGHN LESION ON RIGHT LUNG REMOVED  . Ascites     HISTORY  . History of colonic diverticulitis   . Olecranon bursitis    Past Surgical History:  Past Surgical History  Procedure Laterality Date  . Hemorrhoid surgery  12/07/1998  . Lung surgery  12/07/2001    BENIGN LESION ON RIGHT LUNG REMOVED  . Colonoscopy    . Femur im nail Left 01/21/2013    Procedure: INTRAMEDULLARY (IM) NAIL FEMORAL;  Surgeon: Velna Ochs, MD;  Location: [facility] ORS;  Service: Orthopedics;   Laterality: Left;  . Esophagogastroduodenoscopy N/A 03/16/2013    Procedure: ESOPHAGOGASTRODUODENOSCOPY (EGD);  Surgeon: Florencia Reasons, MD;  Location: [facility] ENDOSCOPY;  Service: Endoscopy;   Laterality: N/A;  will probably want to do at bedside in  ICU---bed placement pending   HPI:  77 y.o. male with known stroke risk factors of HTN and  hyperlipidemia. Patient was LSN on Saturday and awoke on Sunday  having left facial droop and dysarthria. While in ED patient had  a MRI showing right parietal infarct. Pt also reported to have  difficutly eating.      Assessment / Plan / Recommendation Clinical Impression  Dysphagia Diagnosis: Moderate pharyngeal phase  dysphagia;Severe  pharyngeal phase dysphagia;Moderate oral phase dysphagia;Severe  oral phase dysphagia Clinical impression: Patient presents with a moderate-severe  sensory motor based oropharyngeal dysphagia marked by severe left  sided oral deficits resulting in decreased bolus cohesion,  anterior spillage, decreased posterior propulsion, and oral  residuals followed by a delay in swallow initiation which leads  to penetration of all consistencies tested. The use of a chin  tuck assisted to decrease residue and penetration episodes which  were flash with puree and honey thick liquids however patient  continued to penetrate thinner consistencies. SLP provided verbal  and visual cueing for this strategy as well as tight labial seal  which was minimally effective at decreasing anterior labial  spillage. Although aspiration risk remains moderately high,  patient very receptive to use of compensatory strategies and  aspiration precautions which were effective to decrease risks.  Recommend  initiation of a conservative diet with strict use of  precautions and close SLP f/u.      Treatment Recommendation  Therapy as outlined in treatment plan below    Diet Recommendation Dysphagia 1 (Puree);Honey-thick liquid   Liquid Administration via: Spoon Medication Administration: Crushed with puree Supervision: Patient able to self feed;Full supervision/cueing  for compensatory strategies Compensations: Slow rate;Small sips/bites;Check for anterior  loss;Check for pocketing;Multiple dry swallows after each  bite/sip;Effortful swallow Postural Changes and/or Swallow Maneuvers: Chin tuck;Seated  upright 90 degrees;Upright 30-60 min after meal    Other  Recommendations Recommended Consults: MBS Oral Care Recommendations: Oral care before and after PO Other Recommendations: Have oral suction available (SLp placed)   Follow Up Recommendations  Skilled Nursing facility    Frequency and Duration min 3x week  2 weeks       SLP Swallow  Goals Patient will utilize recommended strategies during swallow to  increase swallowing safety with: Supervision/safety Swallow Study Goal #2 - Progress: Not met   General HPI: 77 y.o. male with known stroke risk factors of HTN  and hyperlipidemia. Patient was LSN on Saturday and awoke on  Sunday having left facial droop and dysarthria. While in ED  patient had a MRI showing right parietal infarct. Pt also  reported to have difficutly eating.  Type of Study: Modified Barium Swallowing Study Reason for Referral: Objectively evaluate swallowing function Previous Swallow Assessment: none Diet Prior to this Study: NPO Temperature Spikes Noted: No Respiratory Status: Nasal cannula History of Recent Intubation: No Behavior/Cognition: Alert;Cooperative;Pleasant mood Oral Cavity - Dentition: Dentures, top;Poor condition;Missing  dentition Oral Motor / Sensory Function: Impaired - see Bedside swallow  eval Self-Feeding Abilities: Able to feed self Patient Positioning: Upright in chair Baseline Vocal Quality: Wet Volitional Cough: Wet Volitional Swallow: Able to elicit Anatomy: Other (Comment) (? ostephytes along cervical spine; MD  not present to confirm) Pharyngeal Secretions: Not observed secondary MBS (however  suspect based on wet vocal quality)    Reason for Referral Objectively evaluate swallowing function   Oral Phase Oral Preparation/Oral Phase Oral Phase: Impaired Oral - Honey Oral - Honey Teaspoon: Left anterior bolus loss;Weak lingual  manipulation;Lingual pumping;Reduced posterior propulsion;Left  pocketing in lateral sulci;Lingual/palatal residue;Delayed oral  transit Oral - Nectar Oral - Nectar Teaspoon: Left anterior bolus loss;Weak lingual  manipulation;Lingual pumping;Reduced posterior propulsion;Left  pocketing in lateral sulci;Lingual/palatal residue;Delayed oral  transit Oral - Solids Oral - Puree: Left anterior bolus loss;Weak lingual  manipulation;Lingual pumping;Reduced posterior propulsion;Left   pocketing in lateral sulci;Lingual/palatal residue;Delayed oral  transit   Pharyngeal Phase Pharyngeal Phase Pharyngeal Phase: Impaired Pharyngeal - Honey Pharyngeal - Honey Teaspoon: Delayed swallow initiation;Premature  spillage to valleculae;Reduced tongue base retraction;Pharyngeal  residue - valleculae;Penetration/Aspiration before  swallow;Compensatory strategies attempted (Comment) Penetration/Aspiration details (honey teaspoon): Material enters  airway, remains ABOVE vocal cords then ejected out (flash  penetration decreases residue and penetration) Pharyngeal - Nectar Pharyngeal - Nectar Teaspoon: Delayed swallow  initiation;Premature spillage to pyriform sinuses;Reduced tongue  base retraction;Penetration/Aspiration during  swallow;Penetration/Aspiration after swallow;Pharyngeal residue -  valleculae;Compensatory strategies attempted (Comment) Penetration/Aspiration details (nectar teaspoon): Material enters  airway, CONTACTS cords and not ejected out (flash penetration  decreases residue ) Pharyngeal - Solids Pharyngeal - Puree: Delayed swallow initiation;Premature spillage  to valleculae;Reduced tongue base retraction;Pharyngeal residue -  valleculae;Penetration/Aspiration before swallow Penetration/Aspiration details (puree): Material enters airway,  remains ABOVE vocal cords then ejected out (flash penetration  decreases residue and penetration)  Cervical Esophageal Phase    GO   Leah McCoy  MA, CCC-SLP [phone]  Cervical Esophageal Phase Cervical Esophageal Phase: [facility]         McCoy Leah Meryl 09/20/2013, 11:09 AM      Assessment and Plan  1. Acute CVA - see below. 2. Atrial flutter/fibrillation by telemetry - he has h/o rhythm issues in past but this is first official documentation of atrial fib/flutter. Concerned that this is source of CVA. TEE was attempted 10/17 but the patient would only breathe through his mouth, desaturated, and held his breath. There was question of r/s for today with  anesthesia in OR. I will discuss with Dr. Elease Hashimoto the need to proceed with TEE if we now have a possible source (PAF). CHADSVASC is high at 5 indicating a need for anticoagulation, but his candidacy is in question (HASBLED score = 5). He has ongoing alcohol abuse and thrombocytopenia. He has h/o GIB/hemorrhagic shock 03/2013 secondary to bleeding ulcers, but has been tolerating Plavix at home recently without complication. Will d/w MD. 3. Evidence of conduction disease with LAFB, h/o incomplete RBBB, h/o Mobitz I - intermittent bradycardia on telemetry but tolerating low dose Coreg so far. Check Mg given h/o hypomagnesemia. 4. Newly recognized LV dysfunction EF 20-25% - ?EtOH vs hypertensive heart disease or CAD. No recent anginal sx. Currently appears euvolemic. Continue ARB and BB. Will defer need for stress testing to MD - not an ideal candidate for cath given CKD and recent stroke.  5. Ongoing alcohol abuse - sounds like he started drinking again after coming home from the nursing home. No evidence of withdrawal. Cessation advised. 6. Community acquired pneumonia - on antibiotics. 7. CKD stage III - f/u BMET. 8. HTN - unable to further titrate his BB due to #3. Consider adding back home Lasix if Cr OK. 9. Thrombocytopenia - suspect r/t EtOH.  Signed, Ronie Spies PA-C   Attending Note:   The patient was seen and examined.  Agree with assessment and plan as noted above.  Changes made to the above note as needed.  At this point, I do not see any reason to do TEE.  He has documented Afib / flutter.   Will restart his diet - dysphagia 1 - honey thick.     In addition, we do not need to implant an loop recorder since we already have the diagnosis of A flutter.   His risk of bleeding is high.  He is very unsteady on his feet.  He is being seen by PT currently and is requiring lots of assistance.  At this point, I think his risk of bleeding is quite high and would not start anticoagulation.  If he  improves and if he goes to SNF, we could consider anticoagulation.  Currently he lives alone.  I think this is a very high risk for him.   Vesta Mixer, Montez Hageman., MD, [facility] 09/25/2013, 8:53 AM

## 2013-09-25 NOTE — Progress Notes (Signed)
Pt has an appt with Rolene Arbour, NP, at [facility], on Monday, Nov 3rd at 8:00 a.m.

## 2013-09-25 NOTE — Clinical Social Work Psychosocial (Signed)
Clinical Social Work Department BRIEF PSYCHOSOCIAL ASSESSMENT 09/25/2013  Patient:  Matthew Mahoney, Matthew Mahoney     Account Number:  [id number]     Admit date:  09/19/2013  Clinical Social Worker:  Robin Searing  Date/Time:  09/25/2013 02:11 PM  Referred by:  Physician  Date Referred:  09/25/2013 Referred for  SNF Placement   Other Referral:   Interview type:  Other - See comment Other interview type:   Patient and sister/ Fannie Knee [phone]    PSYCHOSOCIAL DATA Living Status:  ALONE Admitted from facility:   Level of care:   Primary support name:  sister Primary support relationship to patient:  FAMILY Degree of support available:   good but minimal    CURRENT CONCERNS Current Concerns  Post-Acute Placement   Other Concerns:    SOCIAL WORK ASSESSMENT / PLAN Met with patient who is agreeable to plans for SNF- he lives alone and was in a SNF through May for hip fx rehab. Patient gave CSW permission to contact sister and she also is agreeable to SNF-  Both indicate preference in not going back to the SNF he was at previously in the Spring and would like to pursue [facility].   Assessment/plan status:  Other - See comment Other assessment/ plan:   FL2 and PASARR for SNF search   Information/referral to community resources:   SNF list    PATIENT'S/FAMILY'S RESPONSE TO PLAN OF CARE: CSW will pursue SNF offers for patient and update patient and family for d/c -  Patient and sister appreciative of CSW assistance and will await word on SNF options-       Reece Levy, MSW, [employer] [phone]

## 2013-09-25 NOTE — Progress Notes (Signed)
Discussed swallowing difficulty with Dr. Pearlean Brownie. Recommends calorie count to determine intake prior to consideration of additional treatment options. Order written.  Annie Main, MSN, RN, ANVP-BC, ANP-BC, GNP-BC [facility] Pager: [phone] 09/25/2013 3:44 PM

## 2013-09-25 NOTE — Progress Notes (Signed)
[facility] PROGRESS NOTE  Matthew Mahoney WUJ:[id number] DOB: 1935/12/20 DOA: 09/19/2013 PCP: Willow Ora, MD  Assessment/Plan: 1. CVA - Plavix 75 mg d/c and Eliquis 2.5 mg BID started per neurology - MRI:5 mm acute infarct right occipital lobe. Acute infarct right frontal  parietal cortex and subcortical white matter. - ECHO: Mildly dilated LV with severe diffuse hypokinesis, EF 20-25%. Moderate diastolic dysfunction with evidence for elevated LV filling pressure. Normal RV size with mildly decreased systolic function. Mild MR. -Carotid dopplers:Bilateral: 1-39% ICA stenosis. Vertebral artery flow is antegrade -TEE was attempted 10/17 but unsuccessful d/t desaturation  - TEE not Will not be attempted again per Cardiology because pt has documented afib/flutter. In addition, no need for loop recorder since pt diagnosed with A. Flutter and that is presumed to be the cause of patient's most recent CVA.  - Pt high risk of falls and per cardiology do not recommend anticoagulation unless going to SNF  - Pt to be d/c to SNF -PT/OT recommends SNF   - Speech therapy recommends dysphagia with honey thicken- but due to coughing and exhaustion with feeding self a PEG tube may be considered discussed with neurology - Pt in agreement for PEG placement if needed  -Discussed with neurology who feels Calorie count should be done prior to additional treatment options. PEG if needed could be addressed at SNF  -Calorie count    2. HTN Range sbp 156-161/ dbp 84-100 for 09/25/2013  -pt to continue benicar and coreg    3. Community acquired pneumonia [id number] infiltrate is noted throughout the mid and lower right lung no sizable effusion is noted.  -Afebrile  - doxycycline (day 7) due to prolonged QT interval  - Given that patient has had trouble with swallowing and waited 3 days prior to presentation suspect patient most likely had aspiration pneumonia. Patient will have to be discharged on  aspiration precautions  4. CKD Stage III Creatinine improved to 1.32 on 09/25/13 (baseline around 1.6)  5. Hypokalemia  Pt replaced with K-Dur 40 meq  BMP in AM  6. Hypo magnesium - replaced with magnesium 2 gm x 1 - Magnesium to be checked in am  -Most likely contributing to #1, most likely due to poor oral intake associated with new CVA  6. Atrial fibrillation/flutter  -Rate controlled   7. LV dysfunction EF 20-25%  -stable  - Continue ARB and BB    Code Status: Full  Family Communication: POC discussed with patient  Disposition Plan: Pending placement into skilled nursing facility   Consultants:  Neurology  Procedures:  none  Antibiotics:  Rocephin started  09/19/2013  Doxycycline started 09/19/2013  HPI/Subjective: 77 year old male with history of HTN, Aortic stenosis, COPD, CKD stage III who presented to ED with slurred speech and left sided facial droop for three days starting 09/16/2013.  MRI revealed 5 MM acute infarct in right occipital lobe.   No new complaints at this time. Pt still complaining of drooling and coughing when eating. No headache, chest pain or SOB. Pt in agreement with PEG if needed given the fact he is having trouble swallowing and at risk for aspiration.  Objective: Filed Vitals:   09/25/13 1435  BP: 153/98  Pulse: 75  Temp: 97.3 F (36.3 C)  Resp: 17    Intake/Output Summary (Last 24 hours) at 09/25/13 1551 Last data filed at 09/25/13 1150  Gross per 24 hour  Intake    240 ml  Output    500 ml  Net   -260 ml   Filed Weights   09/19/13 1535  Weight: 83.598 kg (184 lb 4.8 oz)    Exam:   General: Pt in bed in NAD  Cardiovascular:irregularly irregular.   Radial and pedal pulses 2+ with no cyanosis or edema  Respiratory: regular unlabored breathing. Lungs CTA with no wheeze or rhonchi  Abdomen: symmetric soft, ND, NT . BS X4   Musculoskeletal: moves all extremities .   Neurological: left mouth droop with  drooling. Tongue deviated to left. Normal finger to nose and heel to shin. Upper and lower extremity strength 5/5   Data Reviewed: Basic Metabolic Panel:  Recent Labs Lab 09/19/13 1230 09/19/13 1935 09/25/13 0757  NA 138  140  --  141  K 4.1  3.9  --  3.0*  CL 98  102  --  100  CO2 22  --  28  GLUCOSE 88  88  --  103*  BUN 25*  27*  --  21  CREATININE 1.63*  2.00* 1.64* 1.32  CALCIUM 10.0  --  9.4  MG  --   --  1.2*   Liver Function Tests:  Recent Labs Lab 09/19/13 1230  AST 20  ALT 22  ALKPHOS 95  BILITOT 1.2  PROT 7.7  ALBUMIN 4.5   No results found for this basename: LIPASE, AMYLASE,  in the last 168 hours No results found for this basename: AMMONIA,  in the last 168 hours CBC:  Recent Labs Lab 09/19/13 1230 09/19/13 1935 09/24/13 1523  WBC 7.2 7.8 8.7  NEUTROABS 4.7  --   --   HGB 14.7  16.0 12.7* 14.0  HCT 43.4  47.0 37.4* 40.6  MCV 88.9 89.3 88.3  PLT 116* 99* 115*   Cardiac Enzymes:  Recent Labs Lab 09/19/13 1230  TROPONINI <0.30   BNP (last 3 results) No results found for this basename: PROBNP,  in the last 8760 hours CBG:  Recent Labs Lab 09/19/13 1205  GLUCAP 80    No results found for this or any previous visit (from the past 240 hour(s)).   Studies: No results found.  Scheduled Meds: . amLODipine  5 mg Oral Daily  . atorvastatin  40 mg Oral Daily  . bicalutamide  50 mg Oral Daily  . carvedilol  3.125 mg Oral BID WC  . clopidogrel  75 mg Oral Q breakfast  . doxycycline  100 mg Oral Q12H  . enoxaparin (LOVENOX) injection  40 mg Subcutaneous Q24H  . irbesartan  300 mg Oral Daily  . magnesium sulfate 1 - 4 g bolus IVPB  2 g Intravenous Once  . pantoprazole  40 mg Oral Daily   Continuous Infusions: . sodium chloride 20 mL/hr (09/25/13 0031)    Active Problems:   Hypertension   Alcohol abuse   CVA (cerebral infarction)   CAP (community acquired pneumonia)   CKD (chronic kidney disease), stage III   Atrial  fibrillation   Secondary cardiomyopathy, unspecified   Atrial flutter   Conduction disorder of the heart   Thrombocytopenia    Time spent: >35 minutes    Penny Pia  [facility] Pager [phone]. If 7PM-7AM, please contact night-coverage at [email or web address], password [facility] 09/25/2013, 3:51 PM  LOS: 6 days

## 2013-09-25 NOTE — Progress Notes (Signed)
Speech Language Pathology Treatment: Dysphagia  Patient Details Name: Matthew Mahoney MRN: [id number] DOB: Oct 20, 1936 Today's Date: 09/25/2013 Time: 1200-1230 SLP Time Calculation (min): 30 min  Assessment / Plan / Recommendation Clinical Impression  Pt seen to f/u for check of diet tolerance and reinforcement of strategies. Per RN pt has had questionable tolerance of diet and is not following precautions well despite supervision from staff. SLP offered min question cues to elicit pt verbalization of strategies. Pt recalled chin tuck, but not need for teaspoon bites, sips. With total assist for feeding and pacing and mod verbal cues for chin tuck pt initially tolerated honey thick liquids well. When pt began self feeding, his pace was too fast without multiple swallows to clear residuals in between bites, resulting in cough response. With verbal cues pt improved self feeding. Pt is tolerating diet, but appears miserable and fatigued.   PO intake will mean moderate aspiration risk in this pt due to severity of dysphagia. However, as long as pulmonary function remains stable would encourage oral feeding to encourage recovery of swallow mechanism. Pt does appear frustrated, "is struggling." Wonder if MD should discuss non oral feeding to supplement nutrition and take pressure off pt during rehab?   HPI HPI: 77 y.o. male with known stroke risk factors of HTN and hyperlipidemia. Patient was LSN on Saturday and awoke on Sunday having left facial droop and dysarthria. While in ED patient had a MRI showing right parietal infarct. Pt also reported to have difficutly eating.    Pertinent Vitals NA  SLP Plan  Continue with current plan of care    Recommendations Diet recommendations: Dysphagia 1 (puree);Honey-thick liquid Liquids provided via: Teaspoon Medication Administration: Crushed with puree Supervision: Patient able to self feed;Full supervision/cueing for compensatory strategies Compensations:  Slow rate;Small sips/bites;Check for anterior loss;Check for pocketing;Multiple dry swallows after each bite/sip;Effortful swallow Postural Changes and/or Swallow Maneuvers: Chin tuck;Seated upright 90 degrees;Upright 30-60 min after meal              Oral Care Recommendations: Oral care before and after PO Follow up Recommendations: Skilled Nursing facility Plan: Continue with current plan of care    GO    [facility], MA CCC-SLP [phone]  Matthew Mahoney 09/25/2013, 2:34 PM

## 2013-09-25 NOTE — Progress Notes (Signed)
Physical Therapy Treatment Patient Details Name: Matthew Mahoney MRN: [id number] DOB: 1936/06/10 Today's Date: 09/25/2013 Time: 4098-1191 PT Time Calculation (min): 28 min  PT Assessment / Plan / Recommendation  History of Present Illness 77 yo male admitted with Lt side weakness and blurry vision. MRI (+) Rt occipital infarct and Rt frontal parietal infarct.   PT Comments   Patient demonstrates increased deficits compared to evaluation. Patient showing more weakness in LLE as well as inability to ambulate without max assist (see below). With decline in mobility levels, continue to recommend ST SNF upon discharge. Will continue to see as indicated and progress activity as tolerated.   Follow Up Recommendations  SNF           Equipment Recommendations  None recommended by PT       Frequency Min 3X/week   Progress towards PT Goals Progress towards PT goals: Not progressing toward goals - comment  Plan Current plan remains appropriate    Precautions / Restrictions Precautions Precautions: Fall Restrictions Weight Bearing Restrictions: No   Pertinent Vitals/Pain Patient reports no pain, SpO2 on rm air 96%     Mobility  Bed Mobility Bed Mobility: Supine to Sit;Sitting - Scoot to Edge of Bed Supine to Sit: 4: Min guard;With rails Sitting - Scoot to Edge of Bed: 4: Min guard Details for Bed Mobility Assistance: incr time and use of bed rail Transfers Transfers: Sit to Stand;Stand to Sit Sit to Stand: 3: Mod assist;With upper extremity assist;From bed;From chair/3-in-1;From toilet Stand to Sit: 3: Mod assist;With upper extremity assist;To chair/3-in-1 Details for Transfer Assistance: Patient very unsteady with mobility today, increased assist to stand and stabilize Ambulation/Gait Ambulation/Gait Assistance: 2: Max assist Ambulation Distance (Feet): 24 Feet Assistive device: Straight cane;1 person hand held assist (hand held assist and held furniture on left (bilat  support)) Ambulation/Gait Assistance Details: Patient with increased instability, difficulty initiating steps and placing LEs.  Gait Pattern: Step-through pattern;Decreased stride length;Shuffle;Scissoring;Trunk flexed;Narrow base of support Gait velocity: decreased General Gait Details: Increased instability today, requiring more assist for safty and stability    Exercises General Exercises - Lower Extremity Ankle Circles/Pumps: AROM;Strengthening;Both;10 reps Hip Flexion/Marching: AROM;Strengthening;Both;10 reps;Standing Mini-Sqauts: AROM;Strengthening;Both;10 reps;Standing    PT Goals (current goals can now be found in the care plan section) Acute Rehab PT Goals Patient Stated Goal: none specifically stated  Visit Information  Last PT Received On: 09/25/13 Assistance Needed: +1 (may need plus 2 for safety (pt decline in mobility)) History of Present Illness: 77 yo male admitted with Lt side weakness and blurry vision. MRI (+) Rt occipital infarct and Rt frontal parietal infarct.    Subjective Data  Subjective: I slept well and that is unusual Patient Stated Goal: none specifically stated   Cognition  Cognition Arousal/Alertness: Awake/alert Behavior During Therapy: WFL for tasks assessed/performed Overall Cognitive Status: Impaired/Different from baseline    Balance  Balance Balance Assessed: Yes Static Sitting Balance Static Sitting - Balance Support: Feet supported;No upper extremity supported Static Sitting - Level of Assistance: 6: Modified independent (Device/Increase time) Static Standing Balance Static Standing - Balance Support: Right upper extremity supported;During functional activity Static Standing - Level of Assistance: 3: Mod assist  End of Session PT - End of Session Equipment Utilized During Treatment: Gait belt;Oxygen Activity Tolerance: Patient tolerated treatment well Patient left: in chair;with call bell/phone within reach Nurse Communication:  Mobility status   GP     Fabio Asa 09/25/2013, 9:19 AM Charlotte Crumb, PT DPT  [phone]

## 2013-09-25 NOTE — Clinical Social Work Placement (Signed)
Clinical Social Work Department CLINICAL SOCIAL WORK PLACEMENT NOTE 09/25/2013  Patient:  Matthew Mahoney, Matthew Mahoney  Account Number:  [id number] Admit date:  09/19/2013  Clinical Social Worker:  Robin Searing  Date/time:  09/25/2013 02:24 PM  Clinical Social Work is seeking post-discharge placement for this patient at the following level of care:   SKILLED NURSING   (*CSW will update this form in Epic as items are completed)   09/25/2013  Patient/family provided with [facility] Department of Clinical Social Work's list of facilities offering this level of care within the geographic area requested by the patient (or if unable, by the patient's family).  09/25/2013  Patient/family informed of their freedom to choose among providers that offer the needed level of care, that participate in Medicare, Medicaid or managed care program needed by the patient, have an available bed and are willing to accept the patient.  09/25/2013  Patient/family informed of [facility]' ownership interest in [facility], as well as of the fact that they are under no obligation to receive care at this facility.  PASARR submitted to EDS on 09/25/2013 PASARR number received from EDS on 09/25/2013  FL2 transmitted to all facilities in geographic area requested by pt/family on  09/25/2013 FL2 transmitted to all facilities within larger geographic area on   Patient informed that his/her managed care company has contracts with or will negotiate with  certain facilities, including the following:     Patient/family informed of bed offers received:   Patient chooses bed at  Physician recommends and patient chooses bed at    Patient to be transferred to  on   Patient to be transferred to facility by   The following physician request were entered in Epic:   Additional Comments: Reece Levy, MSW, Theresia Majors [phone]

## 2013-09-25 NOTE — Progress Notes (Signed)
[facility] notified that Pt heart rate is dropping to the 30's while he is asleep no new orders at this time will continue to monitor closely. Ilean Skill LPN

## 2013-09-25 NOTE — Progress Notes (Signed)
Stroke Team Progress Note  HISTORY Matthew Mahoney is a 77 y.o. male who was last seen normal on Saturday 09/16/13. He awoke on Sunday and noted he may have had a facial droop on the left. Later that day he was eating stew and noted he had a hard time keeping it in his mouth. He was brought to ED 10/14 for further evaluation. MRI brain was obtained and is positive for right parietal infarct. There is question of possible Afib en route with EMS. 12 lead obtained in hospital showed NSR. Currently patient is hooked up to telemetry and showing NSR. Patient was not a TPA candidate secondary to delay in arrival. He was admitted for further evaluation and treatment.  SUBJECTIVE No family at bedside. Discussed short term SNF with pt and he is agreeable to speak with SW and consider transfer.  OBJECTIVE Most recent Vital Signs: Filed Vitals:   09/24/13 2131 09/25/13 0200 09/25/13 0600 09/25/13 1025  BP: 139/99 161/84 159/95 156/100  Pulse: 78 85 85 88  Temp: 98.7 F (37.1 C) 97.9 F (36.6 C) 97.9 F (36.6 C) 97.3 F (36.3 C)  TempSrc:    Oral  Resp: 18 18 18 17   Height:      Weight:      SpO2: 99% 97% 96% 100%   CBG (last 3)  No results found for this basename: GLUCAP,  in the last 72 hours  IV Fluid Intake:   . sodium chloride 20 mL/hr (09/25/13 0031)    MEDICATIONS  . amLODipine  5 mg Oral Daily  . atorvastatin  40 mg Oral Daily  . bicalutamide  50 mg Oral Daily  . carvedilol  3.125 mg Oral BID WC  . clopidogrel  75 mg Oral Q breakfast  . doxycycline  100 mg Oral Q12H  . enoxaparin (LOVENOX) injection  40 mg Subcutaneous Q24H  . irbesartan  300 mg Oral Daily  . pantoprazole  40 mg Oral Daily   PRN:  food thickener, senna-docusate  Diet:  Dysphagia 1 honey thick Activity:  As tolerated DVT Prophylaxis:  Lovenox 40 mg sq daily   CLINICALLY SIGNIFICANT STUDIES Basic Metabolic Panel:   Recent Labs Lab 09/19/13 1230 09/19/13 1935 09/25/13 0757  NA 138  140  --  141  K 4.1   3.9  --  3.0*  CL 98  102  --  100  CO2 22  --  28  GLUCOSE 88  88  --  103*  BUN 25*  27*  --  21  CREATININE 1.63*  2.00* 1.64* 1.32  CALCIUM 10.0  --  9.4  MG  --   --  1.2*   Liver Function Tests:   Recent Labs Lab 09/19/13 1230  AST 20  ALT 22  ALKPHOS 95  BILITOT 1.2  PROT 7.7  ALBUMIN 4.5   CBC:   Recent Labs Lab 09/19/13 1230 09/19/13 1935 09/24/13 1523  WBC 7.2 7.8 8.7  NEUTROABS 4.7  --   --   HGB 14.7  16.0 12.7* 14.0  HCT 43.4  47.0 37.4* 40.6  MCV 88.9 89.3 88.3  PLT 116* 99* 115*   Coagulation:   Recent Labs Lab 09/19/13 1230  LABPROT 13.1  INR 1.01   Cardiac Enzymes:   Recent Labs Lab 09/19/13 1230  TROPONINI <0.30   Urinalysis:   Recent Labs Lab 09/19/13 1331  COLORURINE YELLOW  LABSPEC 1.016  PHURINE 5.0  GLUCOSEU NEGATIVE  HGBUR NEGATIVE  BILIRUBINUR SMALL*  KETONESUR 15*  PROTEINUR 30*  UROBILINOGEN 1.0  NITRITE NEGATIVE  LEUKOCYTESUR NEGATIVE   Lipid Panel    Component Value Date/Time   CHOL 120 09/20/2013 0500   TRIG 68 09/20/2013 0500   HDL 39* 09/20/2013 0500   CHOLHDL 3.1 09/20/2013 0500   VLDL 14 09/20/2013 0500   LDLCALC 67 09/20/2013 0500   HgbA1C  Lab Results  Component Value Date   HGBA1C 5.0 09/20/2013    Urine Drug Screen:     Component Value Date/Time   LABOPIA NONE DETECTED 09/19/2013 1331   COCAINSCRNUR NONE DETECTED 09/19/2013 1331   LABBENZ NONE DETECTED 09/19/2013 1331   AMPHETMU NONE DETECTED 09/19/2013 1331   THCU NONE DETECTED 09/19/2013 1331   LABBARB NONE DETECTED 09/19/2013 1331    Alcohol Level:   Recent Labs Lab 09/19/13 1230  ETH <11    CT of the brain  09/19/2013    No intracranial hemorrhage.  Prominent small vessel disease type changes without CT evidence of large acute infarct. Streak artifacts slightly limits evaluation of the posterior fossa structures.  Global atrophy without hydrocephalus.     MRI of the brain  09/19/2013    5 mm acute infarct right  occipital lobe. Moderately large acute infarct right frontal parietal cortex and white matter.  Generalized atrophy with chronic microvascular ischemic change.    MRA of the brain  09/19/2013    No significant intracranial stenosis.  2D Echocardiogram  EF 20-25%. Diffuse hypokinesis. Moderate diastolic dysfunction with evidence for elevated LV filling pressure. Normal RV size with mildly decreased systolic function. Mild MR.  TEE - pt unable to tolerate 10/16, rescheduled for Mon 10/20  Carotid Doppler  No evidence of hemodynamically significant internal carotid artery stenosis. Vertebral artery flow is antegrade.   CXR  09/19/2013   Patchy right lung infiltrate.    EKG  normal sinus rhythm, baseline wander (see ECG for full results).   Therapy Recommendations - SNF  Physical Exam   Pleasant elderly Caucasian male currently not in distress.Awake alert. Afebrile. Head is nontraumatic. Neck is supple without bruit. Hearing is normal. Cardiac exam no murmur or gallop. Lungs are clear to auscultation. Distal pulses are well felt. Neurological Exam :  Awake alert oriented x3 with no aphasia. Minimum dysarthria. Extraocular movements are full range without nystagmus. Blinks to threat bilaterally. Tongue deviates to the left. Moderate left lower facial weakness. No upper or lower extremity drift but fine finger movements are diminished on the left left grip is weak. Mild left hip flexor weakness. Orbits right over left upper extremity. Sensation appears preserved. Coordination is intact. Gait was not tested.  ASSESSMENT [name] is a 77 y.o. male presenting with left facial droop. Imaging confirms a right occipital and right frontal parietal cortical/white matter infarcts. Given multiple vascular distributions of the current strokes, infarcts initially felt to be  embolic secondary to presumed afib - though after review of EMS records atrial fib was not recognized. ECG here without afib. TEE  attempted 10/17 without success and rescheduled for 10/20. In the meanwhile, pt converted to atrial flutter on telemetry. Infarct embolic due to newly recognized atrial flutter. Patient was on no antithrombotics prior to admission. Now on clopidogrel 75 mg orally every day for secondary stroke prevention. Work up completed.  Hypertension, home med resumed. Blood pressure is still high at times. Community acquired pneumonia, likely due to aspiration, on rocephin and doxycycline COPD CAD Hyperlipidemia, LDL 67, on lipitor 40 PTA, now on lipitor 40, goal LDL <  100 (< 70 for diabetics) aoritc stenosis  CKD, stage III, Cr 1.63-2.0  Hospital day # 6  TREATMENT/PLAN  Agree, given confirmed atrial flutter, no need for TEE or loop   Change to Eliquis 2.5 mg bid for secondary stroke prevention.  Short term SNF. Patient medically ready for discharge. Shared with care management. No further stroke workup indicated. Patient has a 10-15% risk of having another stroke over the next year, the highest risk is within 2 weeks of the most recent stroke/TIA (risk of having a stroke following a stroke or TIA is the same). Ongoing risk factor control by Primary Care Physician Stroke Service will sign off. Please call should any needs arise. Follow up with Dr. Pearlean Brownie, Stroke Clinic, in 2 months.'  Dr. Pearlean Brownie discussed diagnosis, prognosis,  treatment options and plan of care with patient and Dr. Cena Benton.    Annie Main, MSN, RN, ANVP-BC, ANP-BC, [facility] Pager: [phone] 09/25/2013 11:25 AM  I have personally obtained a history, examined the patient, evaluated imaging results, and formulated the assessment and plan of care. I agree with the above. Delia Heady, MD

## 2013-09-25 NOTE — Discharge Summary (Signed)
Physician Discharge Summary  Matthew Mahoney XBM:[id number] DOB: 1936/01/24 DOA: 09/19/2013  PCP: Willow Ora, MD  Admit date: 09/19/2013 Discharge date: 09/25/2013  Time spent: > 35  minutes  Recommendations for Outpatient Follow-up:  1. Follow up with Dr. Pearlean Brownie (Neurology) in 2 months 2. Cardiology for outpatient follow up given Atrial flutter/fibrillation, conduction disease with LAFB, h/o incomplete RBBB, h/o Mobitz I, Newly recognized LV dysfunction EF 20-25%, and need for stress testing.  3. Please continue calorie counts. If patient not meeting daily requirements then may consider further treatment options (increase in supplementation vs peg tube placement).  Discharge Diagnoses:  Active Problems:   Hypertension   Alcohol abuse   CVA (cerebral infarction)   CAP (community acquired pneumonia)   CKD (chronic kidney disease), stage III   Atrial fibrillation   Secondary cardiomyopathy, unspecified   Atrial flutter   Conduction disorder of the heart   Thrombocytopenia   Discharge Condition: Stable  Diet recommendation: Dysphagia with honey thicken liquids   Filed Weights   09/19/13 1535  Weight: 83.598 kg (184 lb 4.8 oz)    History of present illness:  Matthew Mahoney is a 77 year old male with history of HTN, Aortic stenosis, COPD, CKD stage III who presented to ED with slurred speech and left sided facial droop for three days starting 09/16/2013. He was brought to ED 10/14 for further evaluation.  MRI revealed 5 MM acute infract in right occipital lobe.  Patient was not a TPA candidate secondary to delay in arrival.   Hospital Course:   1. CVA - Eliquis  2.5 mg BID  started per neurology  - Speech therapy recommends dysphagia with honey thicken  -TEE was attempted 10/17 but unsuccessful d/t  desaturation -  TEE not  Will not be attempted again per Cardiology because pt has documented afib/flutter. In addition, no need for loop recorder since pt diagnosed with A.  Flutter and that is presumed to be the cause of patient's most recent CVA.  - Pt high risk of falls and per cardiology do not recommend anticoagulation unless going to SNF - Pt to be d/c to SNF   2. HTN -pt to continue benicar and coreg    3. Community acquired pneumonia -UU:VOZDGU infiltrate is noted throughout the mid and lower right lung no sizable effusion is noted.  -Afebrile  - doxycycline (day 7) due to prolonged QT interval  - Given that patient has had trouble with swallowing and waited 3 days prior to presentation suspect patient most likely had aspiration pneumonia.   4. CKD Stage III -Creatinine 1.42  (baseline around 1.6)  5. Hypokalemia Pt replaced with K-Dur 40 meq  PCP to follow.   6. Atrial fibrillation/flutter -Rate controlled   7.  LV dysfunction EF 20-25%  -stable - Continue ARB and BB -PCP to consider stress testing and adding home Lasix   8. hypomagnesium - replaced with 2 gm of magnesium on 09/25/13  Procedures: - MRI:5 mm acute infarct right occipital lobe. Acute infarct right frontal  parietal cortex and subcortical white matter.  - ECHO: Mildly dilated LV with severe diffuse hypokinesis, EF 20-25%. Moderate diastolic dysfunction with evidence for elevated LV filling pressure. Normal RV size with mildly decreased systolic function. Mild MR.  -Carotid dopplers:Bilateral: 1-39% ICA stenosis. Vertebral artery flow is antegrade   Consultations:  Neurology  Cardiology   Discharge Exam: Filed Vitals:   09/25/13 1025  BP: 156/100  Pulse: 88  Temp: 97.3 F (36.3 C)  Resp: 17    General: pt in NAD  Cardiovascular: irregularly irregular. Radial and pedal pulses 2+ with no cyanosis or edema Respiratory: regular unlabored breathing. Lungs CTA no wheeze.   Discharge Instructions  Discharge Orders   Future Orders Complete By Expires   Call MD for:  difficulty breathing, headache or visual disturbances  As directed    Call MD for:  extreme  fatigue  As directed    Call MD for:  temperature >100.4  As directed    Diet - low sodium heart healthy  As directed    Discharge instructions  As directed    Comments:     You will need follow up with your neurologist in 2 months post hospital discharge.   Increase activity slowly  As directed        Medication List    STOP taking these medications       furosemide 20 MG tablet  Commonly known as:  LASIX     furosemide 40 MG tablet  Commonly known as:  LASIX     metoprolol succinate 100 MG 24 hr tablet  Commonly known as:  TOPROL-XL      TAKE these medications       amLODipine 10 MG tablet  Commonly known as:  NORVASC  Take 1 tablet (10 mg total) by mouth daily.     apixaban 2.5 MG Tabs tablet  Commonly known as:  ELIQUIS  Take 1 tablet (2.5 mg total) by mouth 2 (two) times daily.     atorvastatin 40 MG tablet  Commonly known as:  LIPITOR  Take 40 mg by mouth daily.     bicalutamide 50 MG tablet  Commonly known as:  CASODEX  Take 50 mg by mouth daily.     carvedilol 3.125 MG tablet  Commonly known as:  COREG  Take 1 tablet (3.125 mg total) by mouth 2 (two) times daily with a meal.     doxycycline 100 MG tablet  Commonly known as:  VIBRA-TABS  Take 1 tablet (100 mg total) by mouth every 12 (twelve) hours.     multivitamin with minerals Tabs tablet  Take 1 tablet by mouth daily.     nicotine 14 mg/24hr patch  Commonly known as:  NICODERM CQ - dosed in mg/24 hours  Place 1 patch onto the skin daily.     olmesartan 40 MG tablet  Commonly known as:  BENICAR  Take 40 mg by mouth daily.     omega-3 acid ethyl esters 1 G capsule  Commonly known as:  LOVAZA  Take 2 g by mouth 2 (two) times daily.     pantoprazole 40 MG tablet  Commonly known as:  PROTONIX  Take 40 mg by mouth daily.     thiamine 100 MG tablet  Take 100 mg by mouth daily.       No Known Allergies     Follow-up Information   Follow up with Gates Rigg, MD. Schedule an  appointment as soon as possible for a visit in 2 months. (stroke clinic)    Specialties:  Neurology, Radiology   Contact information:   [address] [place] [address] [phone]        The results of significant diagnostics from this hospitalization (including imaging, microbiology, ancillary and laboratory) are listed below for reference.    Significant Diagnostic Studies: Dg Chest 2 View  09/19/2013   CLINICAL DATA:  Chest pain and congestion  EXAM: CHEST  2  VIEW  COMPARISON:  03/16/2013  FINDINGS: Cardiac shadow remains enlarged. Patchy infiltrate is noted throughout the mid and lower right lung no sizable effusion is noted. No acute bony abnormality is seen.  IMPRESSION: Patchy right lung infiltrate.   Electronically Signed   By: Alcide Clever M.D.   On: 09/19/2013 12:38   Ct Head Wo Contrast  09/19/2013   CLINICAL DATA:  Left-sided facial droop. Slurred speech. Hypertension. Hyperlipidemia.  EXAM: CT HEAD WITHOUT CONTRAST  TECHNIQUE: Contiguous axial images were obtained from the base of the skull through the vertex without intravenous contrast.  COMPARISON:  01/20/2013.  FINDINGS: No intracranial hemorrhage.  Prominent small vessel disease type changes without CT evidence of large acute infarct. Streak artifacts slightly limits evaluation of the posterior fossa structures.  Global atrophy without hydrocephalus.  No intracranial mass lesion noted on this unenhanced exam.  Vascular calcifications.  Dental disease.  IMPRESSION: No intracranial hemorrhage.  Prominent small vessel disease type changes without CT evidence of large acute infarct. Streak artifacts slightly limits evaluation of the posterior fossa structures.  Global atrophy without hydrocephalus.   Electronically Signed   By: Bridgett Larsson M.D.   On: 09/19/2013 12:54   Mr Maxine Glenn Head Wo Contrast  09/19/2013   CLINICAL DATA:  Left facial droop. Slurred speech. Stroke.  EXAM: MRI HEAD WITHOUT CONTRAST  MRA HEAD  WITHOUT CONTRAST  TECHNIQUE: Multiplanar, multiecho pulse sequences of the brain and surrounding structures were obtained without intravenous contrast. Angiographic images of the head were obtained using MRA technique without contrast.  COMPARISON:  CT 09/19/2013. MRI 04/29/2013  FINDINGS: MRI HEAD FINDINGS  5 mm acute infarct right occipital lobe. Acute infarct right frontal parietal cortex and subcortical white matter. No other areas of acute infarction.  Generalized atrophy. Chronic microvascular ischemic change throughout the white matter bilaterally.  Negative for hemorrhage or mass. No midline shift. Paranasal sinuses are clear.  MRA HEAD FINDINGS  Both vertebral arteries are patent to the basilar. Right PICA is patent. Left PICA not visualized. Left AICA is patent. Right AICA not visualized. Superior cerebellar and posterior cerebral arteries are patent bilaterally without significant stenosis.  Mild atherosclerotic disease in the left cavernous carotid which is patent without significant stenosis. Left anterior and middle cerebral arteries are patent.  Right cavernous carotid is widely patent. Right anterior and middle cerebral arteries are widely patent.  Negative for cerebral aneurysm.  IMPRESSION: MRI HEAD IMPRESSION  5 mm acute infarct right occipital lobe. Moderately large acute infarct right frontal parietal cortex and white matter.  Generalized atrophy with chronic microvascular ischemic change.  MRA HEAD IMPRESSION  No significant intracranial stenosis.   Electronically Signed   By: Marlan Palau M.D.   On: 09/19/2013 15:49   Mr Brain Wo Contrast  09/19/2013   CLINICAL DATA:  Left facial droop. Slurred speech. Stroke.  EXAM: MRI HEAD WITHOUT CONTRAST  MRA HEAD WITHOUT CONTRAST  TECHNIQUE: Multiplanar, multiecho pulse sequences of the brain and surrounding structures were obtained without intravenous contrast. Angiographic images of the head were obtained using MRA technique without contrast.   COMPARISON:  CT 09/19/2013. MRI 04/29/2013  FINDINGS: MRI HEAD FINDINGS  5 mm acute infarct right occipital lobe. Acute infarct right frontal parietal cortex and subcortical white matter. No other areas of acute infarction.  Generalized atrophy. Chronic microvascular ischemic change throughout the white matter bilaterally.  Negative for hemorrhage or mass. No midline shift. Paranasal sinuses are clear.  MRA HEAD FINDINGS  Both vertebral arteries are patent to  the basilar. Right PICA is patent. Left PICA not visualized. Left AICA is patent. Right AICA not visualized. Superior cerebellar and posterior cerebral arteries are patent bilaterally without significant stenosis.  Mild atherosclerotic disease in the left cavernous carotid which is patent without significant stenosis. Left anterior and middle cerebral arteries are patent.  Right cavernous carotid is widely patent. Right anterior and middle cerebral arteries are widely patent.  Negative for cerebral aneurysm.  IMPRESSION: MRI HEAD IMPRESSION  5 mm acute infarct right occipital lobe. Moderately large acute infarct right frontal parietal cortex and white matter.  Generalized atrophy with chronic microvascular ischemic change.  MRA HEAD IMPRESSION  No significant intracranial stenosis.   Electronically Signed   By: Marlan Palau M.D.   On: 09/19/2013 15:49   Dg Swallowing Func-speech Pathology  09/20/2013   Leah Meryl McCoy, CCC-SLP     09/20/2013 11:09 AM Objective Swallowing Evaluation: Modified Barium Swallowing Study   Patient Details  Name: BRUNO LEACH MRN: [id number] Date of Birth: 01-01-1936  Today's Date: 09/20/2013 Time: 1035-1056 SLP Time Calculation (min): 21 min  Past Medical History:  Past Medical History  Diagnosis Date  . Hypertension   . Edema of leg   . COPD (chronic obstructive pulmonary disease)   . Choledocholithiasis   . Coronary arteriosclerosis   . Hyperlipidemia   . Ichthyosis   . Mitral regurgitation   . Onychomycosis   . Vitamin D  deficiency   . Aortic stenosis   . Hyperkalemia   . Hypocalcemia   . Lymphadenopathy   . Prostate cancer   . Status post lung surgery 12/07/2001    BENIGHN LESION ON RIGHT LUNG REMOVED  . Ascites     HISTORY  . History of colonic diverticulitis   . Olecranon bursitis    Past Surgical History:  Past Surgical History  Procedure Laterality Date  . Hemorrhoid surgery  12/07/1998  . Lung surgery  12/07/2001    BENIGN LESION ON RIGHT LUNG REMOVED  . Colonoscopy    . Femur im nail Left 01/21/2013    Procedure: INTRAMEDULLARY (IM) NAIL FEMORAL;  Surgeon: Velna Ochs, MD;  Location: [facility];  Service: Orthopedics;   Laterality: Left;  . Esophagogastroduodenoscopy N/A 03/16/2013    Procedure: ESOPHAGOGASTRODUODENOSCOPY (EGD);  Surgeon: Florencia Reasons, MD;  Location: [facility] ENDOSCOPY;  Service: Endoscopy;   Laterality: N/A;  will probably want to do at bedside in  ICU---bed placement pending   HPI:  77 y.o. male with known stroke risk factors of HTN and  hyperlipidemia. Patient was LSN on Saturday and awoke on Sunday  having left facial droop and dysarthria. While in ED patient had  a MRI showing right parietal infarct. Pt also reported to have  difficutly eating.      Assessment / Plan / Recommendation Clinical Impression  Dysphagia Diagnosis: Moderate pharyngeal phase dysphagia;Severe  pharyngeal phase dysphagia;Moderate oral phase dysphagia;Severe  oral phase dysphagia Clinical impression: Patient presents with a moderate-severe  sensory motor based oropharyngeal dysphagia marked by severe left  sided oral deficits resulting in decreased bolus cohesion,  anterior spillage, decreased posterior propulsion, and oral  residuals followed by a delay in swallow initiation which leads  to penetration of all consistencies tested. The use of a chin  tuck assisted to decrease residue and penetration episodes which  were flash with puree and honey thick liquids however patient  continued to penetrate thinner consistencies. SLP provided  verbal  and visual cueing for this strategy as well  as tight labial seal  which was minimally effective at decreasing anterior labial  spillage. Although aspiration risk remains moderately high,  patient very receptive to use of compensatory strategies and  aspiration precautions which were effective to decrease risks.  Recommend initiation of a conservative diet with strict use of  precautions and close SLP f/u.      Treatment Recommendation  Therapy as outlined in treatment plan below    Diet Recommendation Dysphagia 1 (Puree);Honey-thick liquid   Liquid Administration via: Spoon Medication Administration: Crushed with puree Supervision: Patient able to self feed;Full supervision/cueing  for compensatory strategies Compensations: Slow rate;Small sips/bites;Check for anterior  loss;Check for pocketing;Multiple dry swallows after each  bite/sip;Effortful swallow Postural Changes and/or Swallow Maneuvers: Chin tuck;Seated  upright 90 degrees;Upright 30-60 min after meal    Other  Recommendations Recommended Consults: MBS Oral Care Recommendations: Oral care before and after PO Other Recommendations: Have oral suction available (SLp placed)   Follow Up Recommendations  Skilled Nursing facility    Frequency and Duration min 3x week  2 weeks       SLP Swallow Goals Patient will utilize recommended strategies during swallow to  increase swallowing safety with: Supervision/safety Swallow Study Goal #2 - Progress: Not met   General HPI: 77 y.o. male with known stroke risk factors of HTN  and hyperlipidemia. Patient was LSN on Saturday and awoke on  Sunday having left facial droop and dysarthria. While in ED  patient had a MRI showing right parietal infarct. Pt also  reported to have difficutly eating.  Type of Study: Modified Barium Swallowing Study Reason for Referral: Objectively evaluate swallowing function Previous Swallow Assessment: none Diet Prior to this Study: NPO Temperature Spikes Noted: No Respiratory Status:  Nasal cannula History of Recent Intubation: No Behavior/Cognition: Alert;Cooperative;Pleasant mood Oral Cavity - Dentition: Dentures, top;Poor condition;Missing  dentition Oral Motor / Sensory Function: Impaired - see Bedside swallow  eval Self-Feeding Abilities: Able to feed self Patient Positioning: Upright in chair Baseline Vocal Quality: Wet Volitional Cough: Wet Volitional Swallow: Able to elicit Anatomy: Other (Comment) (? ostephytes along cervical spine; MD  not present to confirm) Pharyngeal Secretions: Not observed secondary MBS (however  suspect based on wet vocal quality)    Reason for Referral Objectively evaluate swallowing function   Oral Phase Oral Preparation/Oral Phase Oral Phase: Impaired Oral - Honey Oral - Honey Teaspoon: Left anterior bolus loss;Weak lingual  manipulation;Lingual pumping;Reduced posterior propulsion;Left  pocketing in lateral sulci;Lingual/palatal residue;Delayed oral  transit Oral - Nectar Oral - Nectar Teaspoon: Left anterior bolus loss;Weak lingual  manipulation;Lingual pumping;Reduced posterior propulsion;Left  pocketing in lateral sulci;Lingual/palatal residue;Delayed oral  transit Oral - Solids Oral - Puree: Left anterior bolus loss;Weak lingual  manipulation;Lingual pumping;Reduced posterior propulsion;Left  pocketing in lateral sulci;Lingual/palatal residue;Delayed oral  transit   Pharyngeal Phase Pharyngeal Phase Pharyngeal Phase: Impaired Pharyngeal - Honey Pharyngeal - Honey Teaspoon: Delayed swallow initiation;Premature  spillage to valleculae;Reduced tongue base retraction;Pharyngeal  residue - valleculae;Penetration/Aspiration before  swallow;Compensatory strategies attempted (Comment) Penetration/Aspiration details (honey teaspoon): Material enters  airway, remains ABOVE vocal cords then ejected out (flash  penetration decreases residue and penetration) Pharyngeal - Nectar Pharyngeal - Nectar Teaspoon: Delayed swallow  initiation;Premature spillage to pyriform  sinuses;Reduced tongue  base retraction;Penetration/Aspiration during  swallow;Penetration/Aspiration after swallow;Pharyngeal residue -  valleculae;Compensatory strategies attempted (Comment) Penetration/Aspiration details (nectar teaspoon): Material enters  airway, CONTACTS cords and not ejected out (flash penetration  decreases residue ) Pharyngeal - Solids Pharyngeal - Puree: Delayed swallow initiation;Premature spillage  to valleculae;Reduced  tongue base retraction;Pharyngeal residue -  valleculae;Penetration/Aspiration before swallow Penetration/Aspiration details (puree): Material enters airway,  remains ABOVE vocal cords then ejected out (flash penetration  decreases residue and penetration)  Cervical Esophageal Phase    GO   Ferdinand Lango MA, CCC-SLP [phone]  Cervical Esophageal Phase Cervical Esophageal Phase: [facility]         McCoy Leah Meryl 09/20/2013, 11:09 AM     Microbiology: No results found for this or any previous visit (from the past 240 hour(s)).   Labs: Basic Metabolic Panel:  Recent Labs Lab 09/19/13 1230 09/19/13 1935 09/25/13 0757  NA 138  140  --  141  K 4.1  3.9  --  3.0*  CL 98  102  --  100  CO2 22  --  28  GLUCOSE 88  88  --  103*  BUN 25*  27*  --  21  CREATININE 1.63*  2.00* 1.64* 1.32  CALCIUM 10.0  --  9.4  MG  --   --  1.2*   Liver Function Tests:  Recent Labs Lab 09/19/13 1230  AST 20  ALT 22  ALKPHOS 95  BILITOT 1.2  PROT 7.7  ALBUMIN 4.5   No results found for this basename: LIPASE, AMYLASE,  in the last 168 hours No results found for this basename: AMMONIA,  in the last 168 hours CBC:  Recent Labs Lab 09/19/13 1230 09/19/13 1935 09/24/13 1523  WBC 7.2 7.8 8.7  NEUTROABS 4.7  --   --   HGB 14.7  16.0 12.7* 14.0  HCT 43.4  47.0 37.4* 40.6  MCV 88.9 89.3 88.3  PLT 116* 99* 115*   Cardiac Enzymes:  Recent Labs Lab 09/19/13 1230  TROPONINI <0.30   BNP: BNP (last 3 results) No results found for this basename: PROBNP,   in the last 8760 hours CBG:  Recent Labs Lab 09/19/13 1205  GLUCAP 80     Signed:  Penny Pia  [facility] 09/25/2013, 2:22 PM

## 2013-09-26 LAB — PHOSPHORUS: Phosphorus: 3 mg/dL (ref 2.3–4.6)

## 2013-09-26 MED ORDER — ENSURE PUDDING PO PUDG: 1.0000 | Freq: Three times a day (TID) | ORAL | Status: DC

## 2013-09-26 NOTE — Progress Notes (Signed)
Patient discharged to skilled facility for further care. Family notified of patient's status. Report called in to facility prior to transportation.

## 2013-09-26 NOTE — Progress Notes (Signed)
Clinical Social Work Department CLINICAL SOCIAL WORK PLACEMENT NOTE 09/26/2013  Patient:  Matthew Mahoney, Matthew Mahoney  Account Number:  [id number] Admit date:  09/19/2013  Clinical Social Worker:  Robin Searing  Date/time:  09/25/2013 02:24 PM  Clinical Social Work is seeking post-discharge placement for this patient at the following level of care:   SKILLED NURSING   (*CSW will update this form in Epic as items are completed)   09/25/2013  Patient/family provided with [facility] Department of Clinical Social Work's list of facilities offering this level of care within the geographic area requested by the patient (or if unable, by the patient's family).  09/25/2013  Patient/family informed of their freedom to choose among providers that offer the needed level of care, that participate in Medicare, Medicaid or managed care program needed by the patient, have an available bed and are willing to accept the patient.  09/25/2013  Patient/family informed of [facility]' ownership interest in [facility], as well as of the fact that they are under no obligation to receive care at this facility.  PASARR submitted to EDS on 09/25/2013 PASARR number received from EDS on 09/25/2013  FL2 transmitted to all facilities in geographic area requested by pt/family on  09/25/2013 FL2 transmitted to all facilities within larger geographic area on   Patient informed that his/her managed care company has contracts with or will negotiate with  certain facilities, including the following:     Patient/family informed of bed offers received:  09/26/2013 Patient chooses bed at [facility] Physician recommends and patient chooses bed at    Patient to be transferred to [facility] on  09/26/2013 Patient to be transferred to facility by EMS  The following physician request were entered in Epic:   Additional Comments: Reece Levy, MSW, Theresia Majors [phone]

## 2013-09-26 NOTE — Progress Notes (Signed)
Physical Therapy Treatment Patient Details Name: Matthew Mahoney MRN: [id number] DOB: Oct 06, 1936 Today's Date: 09/26/2013 Time: 4782-9562 PT Time Calculation (min): 28 min  PT Assessment / Plan / Recommendation  History of Present Illness 77 yo male admitted with Lt side weakness and blurry vision. MRI (+) Rt occipital infarct and Rt frontal parietal infarct.   PT Comments   Patient progressing well today with ambulation. Feels more confident while ambulating with 2 people. Patient is awaiting SNF at this time for continued rehab  Follow Up Recommendations  SNF     Does the patient have the potential to tolerate intense rehabilitation     Barriers to Discharge        Equipment Recommendations  None recommended by PT    Recommendations for Other Services    Frequency Min 3X/week   Progress towards PT Goals Progress towards PT goals: Progressing toward goals  Plan Current plan remains appropriate    Precautions / Restrictions Precautions Precautions: Fall   Pertinent Vitals/Pain Denied pain   Mobility  Bed Mobility Supine to Sit: 4: Min assist;With rails Details for Bed Mobility Assistance: incr time and use of bed rail Transfers Sit to Stand: 3: Mod assist;With upper extremity assist;From bed;From chair/3-in-1 Stand to Sit: With upper extremity assist;To chair/3-in-1;4: Min assist Details for Transfer Assistance: A for stability with standing and for proper postiioning with stting. Patient tends to grab armrest in front then turn around into sitting Ambulation/Gait Ambulation/Gait Assistance: 1: +2 Total assist Ambulation/Gait: Patient Percentage: 70% Assistive device: 2 person hand held assist Ambulation/Gait Assistance Details: Patient leaning heavily into hands. CUes to stand tall. Worse with fatigue. Required one seated rest break Gait Pattern: Step-through pattern;Decreased stride length;Shuffle;Trunk flexed;Narrow base of support Gait velocity: decreased     Exercises     PT Diagnosis:    PT Problem List:   PT Treatment Interventions:     PT Goals (current goals can now be found in the care plan section)    Visit Information  Last PT Received On: 09/26/13 Assistance Needed: +1 History of Present Illness: 77 yo male admitted with Lt side weakness and blurry vision. MRI (+) Rt occipital infarct and Rt frontal parietal infarct.    Subjective Data      Cognition  Cognition Arousal/Alertness: Awake/alert Behavior During Therapy: WFL for tasks assessed/performed Overall Cognitive Status: Impaired/Different from baseline    Balance     End of Session PT - End of Session Equipment Utilized During Treatment: Gait belt;Oxygen Activity Tolerance: Patient tolerated treatment well Patient left: in chair;with call bell/phone within reach Nurse Communication: Mobility status   GP     Fredrich Birks 09/26/2013, 11:48 AM  09/26/2013 Fredrich Birks PTA [phone] pager [phone] office

## 2013-09-26 NOTE — Progress Notes (Signed)
Subjective:  Denies chest pain or dyspnea. Keeps trying to get out of bed without assistance. Knows he is in [place].  Objective:  Vital Signs in the last 24 hours: Temp:  [97.3 F (36.3 C)-99.1 F (37.3 C)] 98.3 F (36.8 C) (10/21 0600) Pulse Rate:  [57-91] 57 (10/21 0600) Resp:  [17-18] 18 (10/21 0600) BP: (129-157)/(73-100) 154/73 mmHg (10/21 0600) SpO2:  [95 %-100 %] 100 % (10/21 0600)  Intake/Output from previous day: 10/20 0701 - 10/21 0700 In: 245 [P.O.:245] Out: 500 [Urine:500] Intake/Output from this shift:    . amLODipine  5 mg Oral Daily  . atorvastatin  40 mg Oral Daily  . bicalutamide  50 mg Oral Daily  . carvedilol  3.125 mg Oral BID WC  . clopidogrel  75 mg Oral Q breakfast  . doxycycline  100 mg Oral Q12H  . enoxaparin (LOVENOX) injection  40 mg Subcutaneous Q24H  . irbesartan  300 mg Oral Daily  . pantoprazole  40 mg Oral Daily   . sodium chloride 20 mL/hr (09/25/13 0031)    Physical Exam: The patient appears to be in no distress.  Head and neck exam reveals that the pupils are equal and reactive.  The extraocular movements are full.  There is no scleral icterus.  Mouth and pharynx are benign.  No lymphadenopathy.  No carotid bruits.  The jugular venous pressure is normal.  Thyroid is not enlarged or tender.  Chest is clear to percussion and auscultation.  No rales or rhonchi.  Expansion of the chest is symmetrical.  Heart reveals no abnormal lift or heave.  First and second heart sounds are normal.  There is no murmur gallop rub or click. Pulse slow and irregular.  The abdomen is soft and nontender.  Bowel sounds are normoactive.  There is no hepatosplenomegaly or mass.  There are no abdominal bruits.  Extremities reveal no phlebitis or edema.  Pedal pulses are good.  There is no cyanosis or clubbing.  Neurologic exam left facial droop.  Integument reveals no rash  Lab Results:  Recent Labs  09/24/13 1523  WBC 8.7  HGB 14.0  PLT  115*    Recent Labs  09/25/13 0757 09/26/13 0522  NA 141  --   K 3.0*  --   CL 100  --   CO2 28  --   GLUCOSE 103*  --   BUN 21  --   CREATININE 1.32 1.42*   No results found for this basename: TROPONINI, CK, MB,  in the last 72 hours Hepatic Function Panel No results found for this basename: PROT, ALBUMIN, AST, ALT, ALKPHOS, BILITOT, BILIDIR, IBILI,  in the last 72 hours No results found for this basename: CHOL,  in the last 72 hours No results found for this basename: PROTIME,  in the last 72 hours  Imaging: No results found.  Cardiac Studies: Telemetry shows atrial fib with slow ventricular response during sleeping hours. Assessment/Plan:  1. Acute CVA secondary to paroxysmal atrial fibrillation. 2. Remote CAD with stents on long term plavix. 3. Ongoing alcohol abuse. 4. LV dysfunction with EF 20-25 % possibly secondary to ETOH vs. Hypertensive CV disease. 5. CAP 6. Thrombocytopenia.  Plan: Patient is a fall risk. Not a good candidate for long term anticoagulation unless he will be in a long term care facility. Would continue Plavix. Avoidance of alcohol going forward is very important. Will continue low dose coreg for rate control when active and for LV dysfunction.  LOS: 7 days    Cassell Clement 09/26/2013, 8:14 AM

## 2013-09-26 NOTE — Progress Notes (Signed)
Stroke Team Progress Note  HISTORY Matthew Mahoney is a 77 y.o. male who was last seen normal on Saturday 09/16/13. He awoke on Sunday and noted he may have had a facial droop on the left. Later that day he was eating stew and noted he had a hard time keeping it in his mouth. He was brought to ED 10/14 for further evaluation. MRI brain was obtained and is positive for right parietal infarct. There is question of possible Afib en route with EMS. 12 lead obtained in hospital showed NSR. Currently patient is hooked up to telemetry and showing NSR. Patient was not a TPA candidate secondary to delay in arrival. He was admitted for further evaluation and treatment.  SUBJECTIVE 2 male therapists at the bedside assisting the pt to urinate. Once completed, when Dr. Pearlean Brownie discussed intake with him, he said "Hell yes" to eating more if the therapists would help him up to the chair.  OBJECTIVE Most recent Vital Signs: Filed Vitals:   09/25/13 2200 09/26/13 0200 09/26/13 0600 09/26/13 0949  BP: 157/92 129/84 154/73 147/58  Pulse: 91 66 57 56  Temp: 97.8 F (36.6 C) 99.1 F (37.3 C) 98.3 F (36.8 C) 97.8 F (36.6 C)  TempSrc:    Oral  Resp: 18 18 18 18   Height:      Weight:      SpO2: 97% 95% 100% 99%   CBG (last 3)  No results found for this basename: GLUCAP,  in the last 72 hours  IV Fluid Intake:   . sodium chloride 20 mL/hr (09/25/13 0031)    MEDICATIONS  . amLODipine  5 mg Oral Daily  . atorvastatin  40 mg Oral Daily  . bicalutamide  50 mg Oral Daily  . carvedilol  3.125 mg Oral BID WC  . clopidogrel  75 mg Oral Q breakfast  . doxycycline  100 mg Oral Q12H  . enoxaparin (LOVENOX) injection  40 mg Subcutaneous Q24H  . irbesartan  300 mg Oral Daily  . pantoprazole  40 mg Oral Daily   PRN:  food thickener, senna-docusate  Diet:  Dysphagia 1 honey thick Activity:  As tolerated DVT Prophylaxis:  Lovenox 40 mg sq daily   CLINICALLY SIGNIFICANT STUDIES Basic Metabolic Panel:    Recent Labs Lab 09/19/13 1230  09/25/13 0757 09/26/13 0522  NA 138  140  --  141  --   K 4.1  3.9  --  3.0*  --   CL 98  102  --  100  --   CO2 22  --  28  --   GLUCOSE 88  88  --  103*  --   BUN 25*  27*  --  21  --   CREATININE 1.63*  2.00*  < > 1.32 1.42*  CALCIUM 10.0  --  9.4  --   MG  --   --  1.2* 1.8  PHOS  --   --   --  3.0  < > = values in this interval not displayed. Liver Function Tests:   Recent Labs Lab 09/19/13 1230  AST 20  ALT 22  ALKPHOS 95  BILITOT 1.2  PROT 7.7  ALBUMIN 4.5   CBC:   Recent Labs Lab 09/19/13 1230 09/19/13 1935 09/24/13 1523  WBC 7.2 7.8 8.7  NEUTROABS 4.7  --   --   HGB 14.7  16.0 12.7* 14.0  HCT 43.4  47.0 37.4* 40.6  MCV 88.9 89.3 88.3  PLT 116* 99*  115*   Coagulation:   Recent Labs Lab 09/19/13 1230  LABPROT 13.1  INR 1.01   Cardiac Enzymes:   Recent Labs Lab 09/19/13 1230  TROPONINI <0.30   Urinalysis:   Recent Labs Lab 09/19/13 1331  COLORURINE YELLOW  LABSPEC 1.016  PHURINE 5.0  GLUCOSEU NEGATIVE  HGBUR NEGATIVE  BILIRUBINUR SMALL*  KETONESUR 15*  PROTEINUR 30*  UROBILINOGEN 1.0  NITRITE NEGATIVE  LEUKOCYTESUR NEGATIVE   Lipid Panel    Component Value Date/Time   CHOL 120 09/20/2013 0500   TRIG 68 09/20/2013 0500   HDL 39* 09/20/2013 0500   CHOLHDL 3.1 09/20/2013 0500   VLDL 14 09/20/2013 0500   LDLCALC 67 09/20/2013 0500   HgbA1C  Lab Results  Component Value Date   HGBA1C 5.0 09/20/2013    Urine Drug Screen:     Component Value Date/Time   LABOPIA NONE DETECTED 09/19/2013 1331   COCAINSCRNUR NONE DETECTED 09/19/2013 1331   LABBENZ NONE DETECTED 09/19/2013 1331   AMPHETMU NONE DETECTED 09/19/2013 1331   THCU NONE DETECTED 09/19/2013 1331   LABBARB NONE DETECTED 09/19/2013 1331    Alcohol Level:   Recent Labs Lab 09/19/13 1230  ETH <11    CT of the brain  09/19/2013    No intracranial hemorrhage.  Prominent small vessel disease type changes without CT  evidence of large acute infarct. Streak artifacts slightly limits evaluation of the posterior fossa structures.  Global atrophy without hydrocephalus.     MRI of the brain  09/19/2013    5 mm acute infarct right occipital lobe. Moderately large acute infarct right frontal parietal cortex and white matter.  Generalized atrophy with chronic microvascular ischemic change.    MRA of the brain  09/19/2013    No significant intracranial stenosis.  2D Echocardiogram  EF 20-25%. Diffuse hypokinesis. Moderate diastolic dysfunction with evidence for elevated LV filling pressure. Normal RV size with mildly decreased systolic function. Mild MR.  TEE - pt unable to tolerate 10/16, rescheduled for Mon 10/20  Carotid Doppler  No evidence of hemodynamically significant internal carotid artery stenosis. Vertebral artery flow is antegrade.   CXR  09/19/2013   Patchy right lung infiltrate.    EKG  normal sinus rhythm, baseline wander (see ECG for full results).   Therapy Recommendations - SNF  Physical Exam   Pleasant elderly Caucasian male currently not in distress.Awake alert. Afebrile. Head is nontraumatic. Neck is supple without bruit. Hearing is normal. Cardiac exam no murmur or gallop. Lungs are clear to auscultation. Distal pulses are well felt. Neurological Exam :  Awake alert oriented x3 with no aphasia. Minimum dysarthria. Extraocular movements are full range without nystagmus. Blinks to threat bilaterally. Tongue deviates to the left. Moderate left lower facial weakness. No upper or lower extremity drift but fine finger movements are diminished on the left left grip is weak. Mild left hip flexor weakness. Orbits right over left upper extremity. Sensation appears preserved. Coordination is intact. Gait was not tested.  ASSESSMENT [name] is a 77 y.o. male presenting with left facial droop. Imaging confirms a right occipital and right frontal parietal cortical/white matter infarcts. Given  multiple vascular distributions of the current strokes, infarcts initially felt to be  embolic secondary to presumed afib - though after review of EMS records atrial fib was not recognized. ECG here without afib. TEE attempted 10/17 without success and rescheduled for 10/20. In the meanwhile, pt converted to atrial flutter on telemetry. Infarct embolic due to newly recognized  atrial flutter. Patient was on no antithrombotics prior to admission. Now on eliquis for secondary stroke prevention. Work up completed.  Dysphagia. Lots of effort to eat. Limited intake. Tires pt. Calorie count underway. Pt agrees to PEG if needed. Hypertension, home med resumed. Blood pressure is normalizing. Community acquired pneumonia, likely due to aspiration, on rocephin and doxycycline COPD CAD - stents, on plavix long term prior to admission Hyperlipidemia, LDL 67, on lipitor 40 PTA, now on lipitor 40, goal LDL < 100 (< 70 for diabetics) aoritc stenosis LV dysfunction with DR 20-25% due tp etoh hx hypertensive CV dz  CKD, stage III, Cr 1.63-2.0  etoh abuse  Hospital day # 7  TREATMENT/PLAN  Continue Eliquis 2.5 mg bid for secondary stroke prevention as SNF planned at discharge. Agree if d/c home, anticoagulation may not be a good option due to fall risk and etoh abuse. Should this occur, revert back to plavix. Follow calorie count PEG only as last resort given current po intake.  Recommend discharge to SNF with monitoring. If poor intake remains, can place as an OP.  Dr. Pearlean Brownie discussed diagnosis, prognosis,  treatment options and plan of care with patient and Dr. Cena Benton.   Annie Main, MSN, RN, ANVP-BC, ANP-BC, [facility] Pager: [phone] 09/26/2013 10:31 AM  I have personally obtained a history, examined the patient, evaluated imaging results, and formulated the assessment and plan of care. I agree with the above.  Delia Heady, MD

## 2013-09-26 NOTE — Progress Notes (Signed)
SNF bed selected at [facility]- confirmed bed for d/c today- plan transfer via EMS.  Reece Levy, MSW, Theresia Majors [phone]

## 2013-09-26 NOTE — Progress Notes (Signed)
INITIAL NUTRITION ASSESSMENT  DOCUMENTATION CODES Per approved criteria  -Not Applicable   INTERVENTION:  Calorie count through Wednesday, 10/22   Ensure Pudding 3 times daily (170 kcals, 4 gm protein per 4 oz cup) RD to follow for nutrition care plan  NUTRITION DIAGNOSIS: Inadequate oral intake related to dysphagia, s/p CVA as evidenced by PO intake 5%  Goal: Pt to meet >/= 90% of their estimated nutrition needs  Monitor:  PO & supplemental intake, weight, labs, I/O's  Reason for Assessment: Calorie Count   77 y.o. male  Admitting Dx: slurred speech  ASSESSMENT: Patient with PMH of HTN, COPD, aortic stenosis presented with slurred speech and left side facial droop; in ED CT head was negative but MRI showed right parietal infarct; admitted for further evaluation and treatment.  48 calorie count initiated 10/20.  Patient s/p MBSS 10/15 -- presented with moderate-severe dysphagia; initiated on a Dys 1--honey thick liquid diet; PO intake mostly poor at 0-5% per flowsheet records; per weight records, he's had a 8% weight loss since February/March 2014 (not significant for time frame).  RD observed patient lunch tray -- very little consumed.  Patient taking teaspoons per meal tickets from dinner last night & breakfast this AM; more than likely will need PEG tube placed.  Height: Ht Readings from Last 1 Encounters:  09/19/13 6' (1.829 m)    Weight: Wt Readings from Last 1 Encounters:  09/19/13 184 lb 4.8 oz (83.598 kg)    Ideal Body Weight: 178 lb  % Ideal Body Weight: 103%  Wt Readings from Last 10 Encounters:  09/19/13 184 lb 4.8 oz (83.598 kg)  09/19/13 184 lb 4.8 oz (83.598 kg)  09/19/13 184 lb 4.8 oz (83.598 kg)  03/20/13 193 lb 2 oz (87.6 kg)  03/20/13 193 lb 2 oz (87.6 kg)  02/06/13 200 lb (90.719 kg)  01/21/13 200 lb (90.719 kg)  01/21/13 200 lb (90.719 kg)  05/05/11 199 lb 9.6 oz (90.538 kg)    Usual Body Weight: 200 lb  % Usual Body Weight:  92%  BMI:  Body mass index is 24.99 kg/(m^2).  Estimated Nutritional Needs: Kcal: 1800-2000 Protein: 90-100 gm Fluid: 1.8-2.0   Skin: Intact  Diet Order: Dysphagia 1, honey-thick liquids  EDUCATION NEEDS: -No education needs identified at this time   Intake/Output Summary (Last 24 hours) at 09/26/13 1313 Last data filed at 09/26/13 1100  Gross per 24 hour  Intake     65 ml  Output    100 ml  Net    -35 ml    Labs:   Recent Labs Lab 09/19/13 1935 09/25/13 0757 09/26/13 0522  NA  --  141  --   K  --  3.0*  --   CL  --  100  --   CO2  --  28  --   BUN  --  21  --   CREATININE 1.64* 1.32 1.42*  CALCIUM  --  9.4  --   MG  --  1.2* 1.8  PHOS  --   --  3.0  GLUCOSE  --  103*  --     Scheduled Meds: . amLODipine  5 mg Oral Daily  . atorvastatin  40 mg Oral Daily  . bicalutamide  50 mg Oral Daily  . carvedilol  3.125 mg Oral BID WC  . clopidogrel  75 mg Oral Q breakfast  . doxycycline  100 mg Oral Q12H  . enoxaparin (LOVENOX) injection  40 mg Subcutaneous Q24H  .  irbesartan  300 mg Oral Daily  . pantoprazole  40 mg Oral Daily    Continuous Infusions: . sodium chloride 20 mL/hr (09/25/13 0031)    Past Medical History  Diagnosis Date  . Hypertension   . Edema of leg   . COPD (chronic obstructive pulmonary disease)   . Choledocholithiasis   . Coronary arteriosclerosis   . Hyperlipidemia   . Ichthyosis   . Mitral regurgitation   . Onychomycosis   . Vitamin D deficiency   . Aortic stenosis   . Hyperkalemia   . Hypocalcemia   . Lymphadenopathy   . Prostate cancer   . Status post lung surgery 12/07/2001    BENIGHN LESION ON RIGHT LUNG REMOVED  . Ascites     HISTORY  . History of colonic diverticulitis   . Olecranon bursitis   . GI bleed     a. 03/2013 a/w hemorrhagic shock - EGD on 03/1013, which revealed active bleeding from proximal duodenum, with complete hemostasis following epinephrine injection therapy. At least 2 duodenal ulcers identified.     Past Surgical History  Procedure Laterality Date  . Hemorrhoid surgery  12/07/1998  . Lung surgery  12/07/2001    BENIGN LESION ON RIGHT LUNG REMOVED  . Colonoscopy    . Femur im nail Left 01/21/2013    Procedure: INTRAMEDULLARY (IM) NAIL FEMORAL;  Surgeon: Velna Ochs, MD;  Location: [facility]S;  Service: Orthopedics;  Laterality: Left;  . Esophagogastroduodenoscopy N/A 03/16/2013    Procedure: ESOPHAGOGASTRODUODENOSCOPY (EGD);  Surgeon: Florencia Reasons, MD;  Location: [facility] ENDOSCOPY;  Service: Endoscopy;  Laterality: N/A;  will probably want to do at bedside in ICU---bed placement pending    Maureen Chatters, RD, LDN Pager #: [phone] After-Hours Pager #: [phone]

## 2013-09-29 ENCOUNTER — Non-Acute Institutional Stay (SKILLED_NURSING_FACILITY): Payer: Medicare Other | Admitting: Internal Medicine

## 2013-09-29 DIAGNOSIS — I639 Cerebral infarction, unspecified: Secondary | ICD-10-CM

## 2013-09-29 DIAGNOSIS — I6350 Cerebral infarction due to unspecified occlusion or stenosis of unspecified cerebral artery: Secondary | ICD-10-CM

## 2013-09-29 DIAGNOSIS — I4891 Unspecified atrial fibrillation: Secondary | ICD-10-CM

## 2013-09-29 DIAGNOSIS — J189 Pneumonia, unspecified organism: Secondary | ICD-10-CM

## 2013-10-09 ENCOUNTER — Encounter: Payer: Medicare Other | Admitting: Nurse Practitioner

## 2013-10-10 ENCOUNTER — Non-Acute Institutional Stay (SKILLED_NURSING_FACILITY): Payer: Medicare Other | Admitting: Adult Health

## 2013-10-10 DIAGNOSIS — I6350 Cerebral infarction due to unspecified occlusion or stenosis of unspecified cerebral artery: Secondary | ICD-10-CM

## 2013-10-10 DIAGNOSIS — Z720 Tobacco use: Secondary | ICD-10-CM

## 2013-10-10 DIAGNOSIS — I10 Essential (primary) hypertension: Secondary | ICD-10-CM

## 2013-10-10 DIAGNOSIS — K219 Gastro-esophageal reflux disease without esophagitis: Secondary | ICD-10-CM

## 2013-10-10 DIAGNOSIS — I4891 Unspecified atrial fibrillation: Secondary | ICD-10-CM

## 2013-10-10 DIAGNOSIS — I2510 Atherosclerotic heart disease of native coronary artery without angina pectoris: Secondary | ICD-10-CM

## 2013-10-10 DIAGNOSIS — I639 Cerebral infarction, unspecified: Secondary | ICD-10-CM

## 2013-10-10 DIAGNOSIS — E785 Hyperlipidemia, unspecified: Secondary | ICD-10-CM

## 2013-10-10 DIAGNOSIS — F17200 Nicotine dependence, unspecified, uncomplicated: Secondary | ICD-10-CM

## 2013-10-11 DIAGNOSIS — K219 Gastro-esophageal reflux disease without esophagitis: Secondary | ICD-10-CM | POA: Insufficient documentation

## 2013-10-11 DIAGNOSIS — I2510 Atherosclerotic heart disease of native coronary artery without angina pectoris: Secondary | ICD-10-CM | POA: Insufficient documentation

## 2013-10-11 HISTORY — DX: Gastro-esophageal reflux disease without esophagitis: K21.9

## 2013-10-11 HISTORY — DX: Atherosclerotic heart disease of native coronary artery without angina pectoris: I25.10

## 2013-10-11 NOTE — Progress Notes (Signed)
Patient ID: Matthew Mahoney, male   DOB: [birth year], 77 y.o.   MRN: [id number]       PROGRESS NOTE  DATE: 10/10/2013   FACILITY: [facility]  LEVEL OF CARE: SNF (31)  Acute Visit  CHIEF COMPLAINT:  Discharge Notes  HISTORY OF PRESENT ILLNESS: This is a56 year old male who is for discharge home with Home health PT, OT, Speech Therapy, Nursing and CNA. He has been admitted to [facility] on 09/26/13 from [facility]. He was having slurred speech and left facial droop for 3 days and was brought to the hospital. He was diagnosed with CVA and presumably due to Atrial Flutter. Patient was admitted to this facility for short-term rehabilitation after the patient's recent hospitalization.  Patient has completed SNF rehabilitation and therapy has cleared the patient for discharge.  Reassessment of ongoing problem(s):  HTN: Pt 's HTN remains stable.  Denies CP, sob, DOE, pedal edema, headaches, dizziness or visual disturbances.  No complications from the medications currently being used.  Last BP : 136/69  HYPERLIPIDEMIA: No complications from the medications presently being used. 10/14 fasting lipid panel showed : cholesterol 120  HDL 39  LDL 67  Trig 68  ATRIAL FIBRILLATION: the patients atrial fibrillation remains stable.  The patient denies DOE, tachycardia, orthopnea, transient neurological sx, pedal edema, palpitations, & PNDs.  No complications noted from the medications currently being used.  PAST MEDICAL HISTORY : Reviewed.  No changes.  CURRENT MEDICATIONS: Reviewed per [facility]  REVIEW OF SYSTEMS:  GENERAL: no change in appetite, no fatigue, no weight changes, no fever, chills or weakness RESPIRATORY: no cough, SOB, DOE, wheezing, hemoptysis CARDIAC: no chest pain, edema or palpitations GI: no abdominal pain, diarrhea, constipation, heart burn, nausea or vomiting  PHYSICAL EXAMINATION  VS:  T 97       P 71       RR 18      BP 136/69      POX 99 %       WT  168.2 (Lb)  GENERAL: no acute distress, normal body habitus EYES: conjunctivae normal, sclerae normal, normal eye lids NECK: supple, trachea midline, no neck masses, no thyroid tenderness, no thyromegaly LYMPHATICS: no LAN in the neck, no supraclavicular LAN RESPIRATORY: breathing is even & unlabored, BS CTAB CARDIAC: Irregularly irregular, no murmur,no extra heart sounds, no edema GI: abdomen soft, normal BS, no masses, no tenderness, no hepatomegaly, no splenomegaly PSYCHIATRIC: the patient is alert & oriented to person, affect & behavior appropriate  LABS/RADIOLOGY: 09/25/13 sodium 141 potassium 2.0 glucose 103 BUN 21 creatinine 1.32 calcium 9.4 09/24/13 WBC 8.7 hemoglobin 14 hematocrit 40.6  09/20/13 cholesterol 120 HDL 39 LDL 67 triglycerides 68   ASSESSMENT/PLAN:  CVA - for Home health rehabilitation  Hypertension - well controlled  Atrial fibrillation -  Rate controlled; continue Eliquis  CAD - stable  CKD, stage III - stable  Tobacco abuse - continue Nicoderm patch  Hyperlipidemia - continue Lipitor  Alcohol abuse - continue Thiamine    I have filled out patient's discharge paperwork and written prescriptions.  Patient will receive home health PT, OT, ST, nursing and CNA.  Total discharge time: Less than 30 minutes Discharge time involved coordination of the discharge process with Child psychotherapist, nursing staff and therapy department. Medical justification for home health services verified.  CPT CODE: 78295

## 2013-10-24 NOTE — Progress Notes (Signed)
Patient ID: Matthew Mahoney, male   DOB: Apr 18, 1936, 77 y.o.   MRN: [id number]        HISTORY & PHYSICAL  DATE: 09/29/2013   FACILITY: [facility]  LEVEL OF CARE: SNF (31)  ALLERGIES:  No Known Allergies  CHIEF COMPLAINT:  Manage CVA, atrial fibrillation, and chronic kidney disease stage III.    HISTORY OF PRESENT ILLNESS:  The patient is a 77 year-old, Caucasian male who presented to the emergency room with slurry speech and left-sided facial droop.  After hospitalization, he is admitted to this facility for short-term rehabilitation.     CVA:  MRI showed a 5 mm acute infarct in the right occipital lobe.  Speech Therapy recommended a dysphagia diet with honey-thickened liquids.  Eliquis was started per Neurology.  TEE was attempted but unsuccessful due to desaturation.  Since the patient has documented atrial fibrillation/atrial flutter, Cardiology felt that there is no need to attempt a TEE again.  Atrial flutter is the presumed cause of the CVA.  Anticoagulation also was not recommended by Cardiology due to high risk of falls.  The patient's CVA remains stable.  Patient denies new neurologic symptoms such as numbness, tingling, weakness, speech difficulties or visual disturbances.  No complications reported from the medications currently being used.    ATRIAL FIBRILLATION: the patients atrial fibrillation remains stable.  The patient denies DOE, tachycardia, orthopnea, transient neurological sx, pedal edema, palpitations, & PNDs.  No complications noted from the medications currently being used.    CHRONIC KIDNEY DISEASE:   The patient has chronic kidney disease stage III.  The patient's chronic kidney disease remains stable.  Patient denies increasing lower extremity swelling or confusion. Last BUN and creatinine are:   Creatinine 1.42.    PAST MEDICAL HISTORY :  Past Medical History  Diagnosis Date  . Hypertension   . Edema of leg   . COPD (chronic obstructive  pulmonary disease)   . Choledocholithiasis   . Coronary arteriosclerosis   . Hyperlipidemia   . Ichthyosis   . Mitral regurgitation   . Onychomycosis   . Vitamin D deficiency   . Aortic stenosis   . Hyperkalemia   . Hypocalcemia   . Lymphadenopathy   . Prostate cancer   . Status post lung surgery 12/07/2001    BENIGHN LESION ON RIGHT LUNG REMOVED  . Ascites     HISTORY  . History of colonic diverticulitis   . Olecranon bursitis   . GI bleed     a. 03/2013 a/w hemorrhagic shock - EGD on 03/1013, which revealed active bleeding from proximal duodenum, with complete hemostasis following epinephrine injection therapy. At least 2 duodenal ulcers identified.    PAST SURGICAL HISTORY: Past Surgical History  Procedure Laterality Date  . Hemorrhoid surgery  12/07/1998  . Lung surgery  12/07/2001    BENIGN LESION ON RIGHT LUNG REMOVED  . Colonoscopy    . Femur im nail Left 01/21/2013    Procedure: INTRAMEDULLARY (IM) NAIL FEMORAL;  Surgeon: Velna Ochs, MD;  Location: [facility];  Service: Orthopedics;  Laterality: Left;  . Esophagogastroduodenoscopy N/A 03/16/2013    Procedure: ESOPHAGOGASTRODUODENOSCOPY (EGD);  Surgeon: Florencia Reasons, MD;  Location: [facility] ENDOSCOPY;  Service: Endoscopy;  Laterality: N/A;  will probably want to do at bedside in ICU---bed placement pending    SOCIAL HISTORY:  reports that he has been smoking Cigarettes.  He has been smoking about 1.50 packs per day. He does not have any smokeless  tobacco history on file. He reports that he drinks alcohol. He reports that he does not use illicit drugs.  FAMILY HISTORY:  Family History  Problem Relation Age of Onset  . Hypertension Mother 110  . Hypertension Father 50  . Hypertension Sister   . Hypertension Brother   . Hypertension Sister   . Hypertension Brother   . Hypertension Brother   . Hypertension Brother   . Hypertension Brother     CURRENT MEDICATIONS: Reviewed per [facility]  REVIEW OF SYSTEMS:  Patient is a poor  historian.  Therefore, review of systems unobtainable.    PHYSICAL EXAMINATION  VS:  T 97.7       P 53      RR 18      BP 135/72      POX 95% room air        WT (Lb)  GENERAL: no acute distress, normal body habitus EYES: conjunctivae normal, sclerae normal, normal eye lids MOUTH/THROAT: lips without lesions,no lesions in the mouth,tongue is without lesions,uvula elevates in midline NECK: supple, trachea midline, no neck masses, no thyroid tenderness, no thyromegaly LYMPHATICS: no LAN in the neck, no supraclavicular LAN RESPIRATORY: breathing is even & unlabored, BS CTAB CARDIAC: RRR, no murmur,no extra heart sounds, no edema GI:  ABDOMEN: abdomen soft, normal BS, no masses, no tenderness  LIVER/SPLEEN: no hepatomegaly, no splenomegaly MUSCULOSKELETAL: HEAD: normal to inspection & palpation BACK: no kyphosis, scoliosis or spinal processes tenderness EXTREMITIES: LEFT UPPER EXTREMITY: full range of motion, normal strength & tone RIGHT UPPER EXTREMITY:  full range of motion, normal strength & tone LEFT LOWER EXTREMITY: strength intact, range of motion moderate   RIGHT LOWER EXTREMITY: strength intact, range of motion moderate   PSYCHIATRIC: the patient is alert & oriented to person, affect & behavior appropriate  LABS/RADIOLOGY: 2D-echo:  Showed severe diffuse hypokinesis, EF 20-25%, moderate diastolic dysfunction.    Carotid doppler:  Showed bilateral ICA stenosis, antegrade vertebral flow.    CT of the chest:  Showed patchy infiltrate in mid and lower right lung zone.    Chest x-ray:  Showed patchy right lung infiltrate.    CT of the head:  No acute findings.    Potassium 3, glucose 103, otherwise BMP normal.    Magnesium 1.2.    Liver profile normal.    Platelets 115, otherwise CBC normal.    ASSESSMENT/PLAN:  CVA.  Continue rehabilitation.    Chronic kidney disease stage III.  Reassess renal functions.    Atrial fibrillation.  Rate controlled.    Pneumonia.   Continue doxycycline as prescribed.    Cardiomyopathy.  Well compensated.    Renovascular hypertension.  Stable.    Check CBC and BMP.    I have reviewed patient's medical records received at admission/from hospitalization.  CPT CODE: [address]

## 2013-10-31 ENCOUNTER — Other Ambulatory Visit: Payer: Self-pay | Admitting: Pharmacist

## 2013-11-06 ENCOUNTER — Other Ambulatory Visit: Payer: Self-pay | Admitting: Pharmacist

## 2013-11-09 ENCOUNTER — Other Ambulatory Visit: Payer: Self-pay | Admitting: Pharmacist

## 2013-11-09 ENCOUNTER — Encounter: Payer: Self-pay | Admitting: Nurse Practitioner

## 2013-11-23 ENCOUNTER — Ambulatory Visit (INDEPENDENT_AMBULATORY_CARE_PROVIDER_SITE_OTHER): Payer: Medicare Other | Admitting: Neurology

## 2013-11-23 ENCOUNTER — Encounter: Payer: Self-pay | Admitting: Neurology

## 2013-11-23 VITALS — BP 124/59 | HR 64 | Temp 97.5°F | Ht 68.4 in | Wt 179.0 lb

## 2013-11-23 DIAGNOSIS — I6350 Cerebral infarction due to unspecified occlusion or stenosis of unspecified cerebral artery: Secondary | ICD-10-CM

## 2013-11-23 NOTE — Patient Instructions (Signed)
Continue Eliquis for stroke prevention for atrial fibrillation and strict control of hypertension with blood pressure goal below 130/90. I have counseled the patient quit smoking and he states he will try. Return for followup in 3 months with Heide Guile  , nurse practitioner or call earlier if necessary.

## 2013-11-23 NOTE — Progress Notes (Signed)
[facility] [address] [place]. [place] [address] [phone]       OFFICE FOLLOW-UP NOTE  [name] Date of Birth:  [birth year] Medical Record Number:  [id number]   HPI: 89 year Caucasian male who woke up on 09/17/13 with left facial droop and later on had trouble eating food from the left side. He presented beyond time in the front prevention for the hospital. MRI scan showed a right frontoparietal and occipital infarct which was felt to become embolic. He was found to have atrial flutter and fib risk factor hence was started on anticoagulation with Eliquis and Plavix was discontinued. Carotid Doppler showed no extracranial stenosis. TEE was attempted but patient was unable to tolerate it. Transthoracic echo showed low ejection fraction of 2025% with diffuse hypokinesis and moderate diastolic dysfunction. MRA of the brain showed no significant canal stenosis. Lipid profile was normal. Patient states his done well since discharge has had no further recurrent stroke or TIA symptoms. He does have some petechiae but no obvious bleeding. He still continues to smoke but has cut back to one pack per day. States his blood pressure is good. He plans to see a dentist next month since he has trouble chewing his food.  ROS:   14 system review of systems is positive for leg swelling, easy bruising, bleeding, diarrhea only and all other systems negative PMH:  Past Medical History  Diagnosis Date  . Hypertension   . Edema of leg   . COPD (chronic obstructive pulmonary disease)   . Choledocholithiasis   . Coronary arteriosclerosis   . Hyperlipidemia   . Ichthyosis   . Mitral regurgitation   . Onychomycosis   . Vitamin D deficiency   . Aortic stenosis   . Hyperkalemia   . Hypocalcemia   . Lymphadenopathy   . Prostate cancer   . Status post lung surgery 12/07/2001    BENIGHN LESION ON RIGHT LUNG REMOVED  . Ascites     HISTORY  . History of colonic diverticulitis   .  Olecranon bursitis   . GI bleed     a. 03/2013 a/w hemorrhagic shock - EGD on 03/1013, which revealed active bleeding from proximal duodenum, with complete hemostasis following epinephrine injection therapy. At least 2 duodenal ulcers identified.    Social History:  History   Social History  . Marital Status: Widowed    Spouse Name: N/A    Number of Children: 0  . Years of Education: 12th   Occupational History  . retired    Social History Main Topics  . Smoking status: Current Some Day Smoker -- 1.50 packs/day    Types: Cigarettes  . Smokeless tobacco: Not on file  . Alcohol Use: Yes     Comment: occasionally  . Drug Use: No  . Sexual Activity: Not Currently   Other Topics Concern  . Not on file   Social History Narrative  . No narrative on file    Medications:   Current Outpatient Prescriptions on File Prior to Visit  Medication Sig Dispense Refill  . amLODipine (NORVASC) 10 MG tablet Take 1 tablet (10 mg total) by mouth daily.  30 tablet  0  . apixaban (ELIQUIS) 2.5 MG TABS tablet Take 1 tablet (2.5 mg total) by mouth 2 (two) times daily.  60 tablet  0  . atorvastatin (LIPITOR) 40 MG tablet Take 40 mg by mouth daily.      . bicalutamide (CASODEX) 50 MG tablet Take 50 mg by  mouth daily.      . carvedilol (COREG) 3.125 MG tablet Take 1 tablet (3.125 mg total) by mouth 2 (two) times daily with a meal.  60 tablet  0  . olmesartan (BENICAR) 40 MG tablet Take 40 mg by mouth daily.      Marland Kitchen omega-3 acid ethyl esters (LOVAZA) 1 G capsule Take 2 g by mouth 2 (two) times daily.      . pantoprazole (PROTONIX) 40 MG tablet Take 40 mg by mouth daily.      Marland Kitchen thiamine 100 MG tablet Take 100 mg by mouth daily.       No current facility-administered medications on file prior to visit.    Allergies:   Allergies  Allergen Reactions  . Plavix [Clopidogrel Bisulfate]     Physical Exam General: well developed, well nourished, seated, in no evident distress Head: head normocephalic  and atraumatic. Orohparynx benign Neck: supple with no carotid or supraclavicular bruits Cardiovascular: regular rate and rhythm, no murmurs Musculoskeletal: no deformity Skin:  no rash petichiae on left dorsum of hand Vascular:  Normal pulses all extremities Filed Vitals:   11/23/13 1436  BP: 124/59  Pulse: 64  Temp: 97.5 F (36.4 C)    Neurologic Exam Mental Status: Awake and fully alert. Oriented to place and time. Recent and remote memory intact. Attention span, concentration and fund of knowledge appropriate. Mood and affect appropriate.  Cranial Nerves: Fundoscopic exam reveals sharp disc margins. Pupils equal, briskly reactive to light. Extraocular movements full without nystagmus. Visual fields full to confrontation. Hearing intact. Facial sensation intact. Face, tongue, palate moves normally and symmetrically.  Motor: Normal bulk and tone. Normal strength in all tested extremity muscles. Sensory.: intact to touch and pinprick and vibratory sensation.  Coordination: Rapid alternating movements normal in all extremities. Finger-to-nose and heel-to-shin performed accurately bilaterally. Gait and Station: Arises from chair without difficulty. Stance is normal. Gait demonstrates normal stride length and balance  but uses a cane. Unable to heel, toe and tandem walk without difficulty.  Reflexes: 1+ and symmetric. Toes downgoing.   NIHSS  0 Modified Rankin  1   ASSESSMENT: 52 year Caucasian male with embolic right occipital and right frontoparietal infarcts in October 2014 secondary to atrial flutter/fibrillation. Vascular risk factors of hypertension, cardiomyopathy, smoking and  atrial fibrillation/flutter    PLAN: I had a long discussion with the patient with regards to his recent hospitalization for stroke, discussed risk factors, secondary stroke prevention and answered questions Continue Eliquis for stroke prevention for atrial fibrillation and strict control of hypertension  with blood pressure goal below 130/90. I have counseled the patient quit smoking and he states he will try. Return for followup in 3 months with Matthew Mahoney  , nurse practitioner or call earlier if necessary.

## 2013-11-24 ENCOUNTER — Encounter: Payer: Self-pay | Admitting: Nurse Practitioner

## 2013-11-24 ENCOUNTER — Telehealth: Payer: Self-pay | Admitting: *Deleted

## 2013-11-24 ENCOUNTER — Ambulatory Visit (INDEPENDENT_AMBULATORY_CARE_PROVIDER_SITE_OTHER): Payer: Medicare Other | Admitting: Nurse Practitioner

## 2013-11-24 VITALS — BP 140/80 | HR 57 | Ht 72.0 in | Wt 181.0 lb

## 2013-11-24 DIAGNOSIS — I4891 Unspecified atrial fibrillation: Secondary | ICD-10-CM

## 2013-11-24 DIAGNOSIS — N289 Disorder of kidney and ureter, unspecified: Secondary | ICD-10-CM

## 2013-11-24 DIAGNOSIS — I5022 Chronic systolic (congestive) heart failure: Secondary | ICD-10-CM

## 2013-11-24 NOTE — Telephone Encounter (Signed)
S.w receptionist at Dr. Chauncy Passy office @ [phone] and will be faxing over pt's labs that were drawn on 12/16

## 2013-11-24 NOTE — Progress Notes (Addendum)
Matthew Mahoney Date of Birth: 07-23-1936 Medical Record #[id number]  History of Present Illness: Matthew Mahoney is seen back today for a post hospital visit for admission back in October. Seen for Dr. Elease Hashimoto. He has a history of HTN, smoking and prostate cancer. Known CAD with remote stents and kept on Plavix. His other issues include substance abuse with alcohol and tobacco, prior CVA, AS, COPD, CKD, and thrombocytopenia.   Last seen by Dr. Elease Hashimoto back in 2012. He has had a 1st degree AV block with an incomplete RBBB and left anterior fascicular block reported in the past. His other issues are as noted below and include substance abuse with both alcohol and tobacco. Last echo in 2010 with a normal EF and mild aortic sclerosis.   Admitted early in 2014 with a left hip fracture. He had slipped and fell on the ice. Had ORIF on 01/21/13. Tolerated ok. Some concern about his rhythm. He apparently had multiple PAC's on the monitor but it looks like he stayed in sinus for that entire admission. CHADS is only a 1.   I saw him back in March for a post hospital visit. He was at [facility]. Seemed to be doing ok.   He was admitted back in October with slurred speech and left sided facial droop. MRI showed 5 mm acute infarct - not treated with TPA due to delay of arrival. He was started on Eliquis, TEE attempted but unsuccessful due to desaturation. Had documented atrial fib/flutter (very first time noted) which was presumed to be the cause of his CVA. EF of 20 to 25% - there was talk of need for a stress test - this has not been carried out. Was given antibiotics for a presumed pneumonia. Dr. Harvie Bridge note at discharge was reviewed.   Comes back today. Here alone. Actually walked in with a cane from the 2nd floor parking deck. Tells me that he went to [facility] for a few weeks after his stroke. Now at home. His sister comes once a week to clean and fill his pill box. He says he is not short of breath. Not  dizzy or lightheaded. Has had 2 falls since he has been home - no significant injury noted. Has chronic swelling in his legs - takes Lasix if it extends up to his thighs - tells me he has not really used any Lasix since he has been home. Saw neurology and was told that things were good. Wanting to get his teeth fixed. He thinks he is doing ok. Still smoking 1 pk per day and drinking 3 to 4 oz of bourbon daily. Dr. Orland Dec' notes reviewed - his CCB has been cut back due to low HR/BP. Tells me he had recent labs with Dr. Duanne Guess.  Current Outpatient Prescriptions  Medication Sig Dispense Refill  . amLODipine (NORVASC) 10 MG tablet Take 5 mg by mouth daily.      Marland Kitchen apixaban (ELIQUIS) 2.5 MG TABS tablet Take 1 tablet (2.5 mg total) by mouth 2 (two) times daily.  60 tablet  0  . atorvastatin (LIPITOR) 40 MG tablet Take 40 mg by mouth daily.      . bicalutamide (CASODEX) 50 MG tablet Take 50 mg by mouth daily.      . carvedilol (COREG) 3.125 MG tablet Take 1 tablet (3.125 mg total) by mouth 2 (two) times daily with a meal.  60 tablet  0  . furosemide (LASIX) 20 MG tablet Take 20 mg by mouth as  needed.      Marland Kitchen olmesartan (BENICAR) 40 MG tablet Take 40 mg by mouth daily.      Marland Kitchen omega-3 acid ethyl esters (LOVAZA) 1 G capsule Take 2 g by mouth 2 (two) times daily.      . pantoprazole (PROTONIX) 40 MG tablet Take 40 mg by mouth daily.      Marland Kitchen thiamine 100 MG tablet Take 100 mg by mouth daily.       No current facility-administered medications for this visit.    Allergies  Allergen Reactions  . Plavix [Clopidogrel Bisulfate]     Past Medical History  Diagnosis Date  . Hypertension   . Edema of leg   . COPD (chronic obstructive pulmonary disease)   . Choledocholithiasis   . Coronary arteriosclerosis   . Hyperlipidemia   . Ichthyosis   . Mitral regurgitation   . Onychomycosis   . Vitamin D deficiency   . Aortic stenosis   . Hyperkalemia   . Hypocalcemia   . Lymphadenopathy   . Prostate cancer   .  Status post lung surgery 12/07/2001    BENIGHN LESION ON RIGHT LUNG REMOVED  . Ascites     HISTORY  . History of colonic diverticulitis   . Olecranon bursitis   . GI bleed     a. 03/2013 a/w hemorrhagic shock - EGD on 03/1013, which revealed active bleeding from proximal duodenum, with complete hemostasis following epinephrine injection therapy. At least 2 duodenal ulcers identified.    Past Surgical History  Procedure Laterality Date  . Hemorrhoid surgery  12/07/1998  . Lung surgery  12/07/2001    BENIGN LESION ON RIGHT LUNG REMOVED  . Colonoscopy    . Femur im nail Left 01/21/2013    Procedure: INTRAMEDULLARY (IM) NAIL FEMORAL;  Surgeon: Velna Ochs, MD;  Location: [facility];  Service: Orthopedics;  Laterality: Left;  . Esophagogastroduodenoscopy N/A 03/16/2013    Procedure: ESOPHAGOGASTRODUODENOSCOPY (EGD);  Surgeon: Florencia Reasons, MD;  Location: [facility] ENDOSCOPY;  Service: Endoscopy;  Laterality: N/A;  will probably want to do at bedside in ICU---bed placement pending    History  Smoking status  . Current Some Day Smoker -- 1.50 packs/day  . Types: Cigarettes  Smokeless tobacco  . Not on file    History  Alcohol Use  . Yes    Comment: occasionally    Family History  Problem Relation Age of Onset  . Hypertension Mother 73  . Hypertension Father 48  . Hypertension Sister   . Hypertension Brother   . Hypertension Sister   . Hypertension Brother   . Hypertension Brother   . Hypertension Brother   . Hypertension Brother     Review of Systems: The review of systems is per the HPI.  All other systems were reviewed and are negative.  Physical Exam: BP 140/80  Pulse 57  Ht 6' (1.829 m)  Wt 181 lb (82.101 kg)  BMI 24.54 kg/m2 Patient is alert and in no acute distress. Looks chronically ill. Walking with a cane - pretty unsteady on his feet. Skin is warm and dry. Color is normal.  HEENT is unremarkable except for very poor teeth - several are loose and has several missing.  Normocephalic/atraumatic. PERRL. Sclera are nonicteric. Neck is supple. No masses. No JVD. Lungs are clear. Cardiac exam shows an irregular rhythm. His rate is ok - about 60 by my exam. Abdomen is soft. Extremities are full with 1+ lower extremity edema.  No gross neurologic deficits  noted.  Wt Readings from Last 3 Encounters:  11/24/13 181 lb (82.101 kg)  11/23/13 179 lb (81.194 kg)  09/19/13 184 lb 4.8 oz (83.598 kg)     LABORATORY DATA:  Lab Results  Component Value Date   WBC 8.7 09/24/2013   HGB 14.0 09/24/2013   HCT 40.6 09/24/2013   PLT 115* 09/24/2013   GLUCOSE 103* 09/25/2013   CHOL 120 09/20/2013   TRIG 68 09/20/2013   HDL 39* 09/20/2013   LDLCALC 67 09/20/2013   ALT 22 09/19/2013   AST 20 09/19/2013   NA 141 09/25/2013   K 3.0* 09/25/2013   CL 100 09/25/2013   CREATININE 1.42* 09/26/2013   BUN 21 09/25/2013   CO2 28 09/25/2013   INR 1.01 09/19/2013   HGBA1C 5.0 09/20/2013   Echo Study Conclusions from October 2014  - Left ventricle: The cavity size was mildly dilated. Wall thickness was normal. Systolic function was severely reduced. The estimated ejection fraction was in the range of 20% to 25%. Diffuse hypokinesis. Features are consistent with a pseudonormal left ventricular filling pattern, with concomitant abnormal relaxation and increased filling pressure (grade 2 diastolic dysfunction). E/medial e' > 15 suggesting LV end diastolic pressure at least 20 mmHg. - Aortic valve: There was no stenosis. - Mitral valve: Mildly calcified annulus. Mildly calcified leaflets . Mild regurgitation. - Left atrium: The atrium was moderately dilated. - Right ventricle: The cavity size was normal. Systolic function was mildly reduced. - Right atrium: The atrium was mildly to moderately dilated. - Pulmonary arteries: No complete TR doppler jet so unable to estimate PA systolic pressure. - Systemic veins: IVC measured 2.1 cm with some respirophasic variation,  suggesting RA pressure 10 mmHg   Assessment / Plan:  1. Atrial fib/flutter - he remains in atrial fib - rate is 57. I have left him on his current regimen. He has had 2 falls already on Eliquis - if this continues - will need to stop and unfortunately, his stroke risk is quite high.   2. CVA  3. Substance abuse - still with ongoing tobacco and alcohol use  4. Systolic HF - EF of 20% - while he has swelling, his weight looks stable. He is not short of breath. Not going to be able to cut back on his salt use due to his social situation. He is eating lots of canned/box foods. On just low dose Coreg and on ARB. I would favor conservative management. He is not really interested in having a stress test.   I will see him back in a month. No change in medicines for now. Try to obtain his labs for review. Overall prognosis looks poor.   Patient is agreeable to this plan and will call if any problems develop in the interim.   Matthew Macadamia, RN, ANP-C [facility] [address] [place], [place]  [address]   Addendum: Comment from Dr. Elease Hashimoto upon his review:   "Yes, your plan sounds reasonable"

## 2013-11-24 NOTE — Patient Instructions (Signed)
We will try to get your blood work from Dr. Chauncy Passy office.   I will see you in a month on a day that Dr. Elease Hashimoto is here  Stay on your current medicines  Try to minimize your salt use  Call the [facility] office at [phone] if you have any questions, problems or concerns.

## 2013-12-26 ENCOUNTER — Encounter (INDEPENDENT_AMBULATORY_CARE_PROVIDER_SITE_OTHER): Payer: Self-pay

## 2013-12-26 ENCOUNTER — Encounter: Payer: Self-pay | Admitting: Nurse Practitioner

## 2013-12-26 ENCOUNTER — Ambulatory Visit (INDEPENDENT_AMBULATORY_CARE_PROVIDER_SITE_OTHER): Payer: Medicare Other | Admitting: Nurse Practitioner

## 2013-12-26 VITALS — BP 110/60 | HR 82 | Ht 70.0 in | Wt 178.4 lb

## 2013-12-26 DIAGNOSIS — I5022 Chronic systolic (congestive) heart failure: Secondary | ICD-10-CM

## 2013-12-26 LAB — CBC
HCT: 33.5 % — ABNORMAL LOW (ref 39.0–52.0)
Hemoglobin: 11.1 g/dL — ABNORMAL LOW (ref 13.0–17.0)
MCHC: 33.2 g/dL (ref 30.0–36.0)
MCV: 94 fl (ref 78.0–100.0)
Platelets: 164 10*3/uL (ref 150.0–400.0)
RBC: 3.56 Mil/uL — ABNORMAL LOW (ref 4.22–5.81)
RDW: 21.8 % — ABNORMAL HIGH (ref 11.5–14.6)
WBC: 8.1 10*3/uL (ref 4.5–10.5)

## 2013-12-26 LAB — BASIC METABOLIC PANEL
BUN: 26 mg/dL — ABNORMAL HIGH (ref 6–23)
CO2: 27 mEq/L (ref 19–32)
Calcium: 9.5 mg/dL (ref 8.4–10.5)
Chloride: 102 mEq/L (ref 96–112)
Creatinine, Ser: 1.6 mg/dL — ABNORMAL HIGH (ref 0.4–1.5)
GFR: 43.97 mL/min — ABNORMAL LOW (ref >60.00)
Glucose, Bld: 100 mg/dL — ABNORMAL HIGH (ref 70–99)
Potassium: 4.6 mEq/L (ref 3.5–5.1)
Sodium: 137 mEq/L (ref 135–145)

## 2013-12-26 NOTE — Progress Notes (Signed)
[name] Date of Birth: 1936-10-02 Medical Record #[id number]  History of Present Illness: Mr. Lehenbauer is seen back today for a one month follow up visit. Seen for Dr. Acie Fredrickson. He has a history of HTN, smoking and prostate cancer. Known CAD with remote stents and kept on Plavix. His other issues include substance abuse with alcohol and tobacco, prior CVA, AS, COPD, CKD, and thrombocytopenia.   Last seen by Dr. Acie Fredrickson back in 2012. He has had a 1st degree AV block with an incomplete RBBB and left anterior fascicular block reported in the past. Last echo in 2010 with a normal EF and mild aortic sclerosis.   Admitted early in 2014 with a left hip fracture. He had slipped and fell on the ice. Had ORIF on 01/21/13. Tolerated ok. Some concern about his rhythm. He apparently had multiple PAC's on the monitor but it looks like he stayed in sinus for that entire admission. CHADS is only a 1.   I saw him back in March of 2014 for a post hospital visit. He was at [employer]. Seemed to be doing ok.   He was admitted back in October with slurred speech and left sided facial droop. MRI showed 5 mm acute infarct - not treated with TPA due to delay of arrival. He was started on Eliquis, TEE attempted but unsuccessful due to desaturation. Had documented atrial fib/flutter (very first time noted) which was presumed to be the cause of his CVA. EF of 20 to 25% - there was talk of need for a stress test - this has not been carried out. Was given antibiotics for a presumed pneumonia.   Seen a month ago - Told me that he went to [facility] for a few weeks after his stroke. Now at home. His sister comes once a week to clean and fill his pill box. He was not short of breath. Not dizzy or lightheaded. Hds had 2 falls since he has been home - no significant injury noted. Has chronic swelling in his legs - takes Lasix if it extends up to his thighs - tells me he has not really used any Lasix since he has been home.  Saw neurology and was told that things were good. Wanting to get his teeth fixed. He thought he was doing ok. Still smoking 1 pk per day and drinking 3 to 4 oz of bourbon daily. Dr. Theone Murdoch' notes reviewed - his CCB has been cut back due to low HR/BP.   I left him on his Eliquis. We did not update his myoview. His prognosis is felt to be poor - mainly due to his social situation.  Comes back today. Here alone. Says he is doing ok. Not short of breath. He will use Lasix prn if he has more swelling - will do this for about 3 days and then stop. No falls. No chest pain. No awareness of his atrial fib. Not totally convinced that he is taking his medicines right. Asking about a Part D plan. He is smoking and drinking - has "3 good bourbons with sprite" every day. Says he is not driving while drinking.   Current Outpatient Prescriptions  Medication Sig Dispense Refill  . amLODipine (NORVASC) 10 MG tablet Take 5 mg by mouth daily.      Marland Kitchen apixaban (ELIQUIS) 2.5 MG TABS tablet Take 1 tablet (2.5 mg total) by mouth 2 (two) times daily.  60 tablet  0  . atorvastatin (LIPITOR) 40 MG tablet Take  40 mg by mouth daily.      . bicalutamide (CASODEX) 50 MG tablet Take 50 mg by mouth daily.      . carvedilol (COREG) 3.125 MG tablet Take 1 tablet (3.125 mg total) by mouth 2 (two) times daily with a meal.  60 tablet  0  . furosemide (LASIX) 20 MG tablet Take 20 mg by mouth as needed.      Marland Kitchen olmesartan (BENICAR) 40 MG tablet Take 40 mg by mouth daily.      Marland Kitchen omega-3 acid ethyl esters (LOVAZA) 1 G capsule Take 2 g by mouth 2 (two) times daily.      Marland Kitchen thiamine 100 MG tablet Take 100 mg by mouth daily.      . pantoprazole (PROTONIX) 40 MG tablet Take 40 mg by mouth daily.       No current facility-administered medications for this visit.    Allergies  Allergen Reactions  . Plavix [Clopidogrel Bisulfate]     Past Medical History  Diagnosis Date  . Hypertension   . Edema of leg   . COPD (chronic obstructive  pulmonary disease)   . Choledocholithiasis   . Coronary arteriosclerosis   . Hyperlipidemia   . Ichthyosis   . Mitral regurgitation   . Onychomycosis   . Vitamin D deficiency   . Aortic stenosis   . Hyperkalemia   . Hypocalcemia   . Lymphadenopathy   . Prostate cancer   . Status post lung surgery 12/07/2001    BENIGHN LESION ON RIGHT LUNG REMOVED  . Ascites     HISTORY  . History of colonic diverticulitis   . Olecranon bursitis   . GI bleed     a. 03/2013 a/w hemorrhagic shock - EGD on 03/1013, which revealed active bleeding from proximal duodenum, with complete hemostasis following epinephrine injection therapy. At least 2 duodenal ulcers identified.    Past Surgical History  Procedure Laterality Date  . Hemorrhoid surgery  12/07/1998  . Lung surgery  12/07/2001    BENIGN LESION ON RIGHT LUNG REMOVED  . Colonoscopy    . Femur im nail Left 01/21/2013    Procedure: INTRAMEDULLARY (IM) NAIL FEMORAL;  Surgeon: Hessie Dibble, MD;  Location: [facility];  Service: Orthopedics;  Laterality: Left;  . Esophagogastroduodenoscopy N/A 03/16/2013    Procedure: ESOPHAGOGASTRODUODENOSCOPY (EGD);  Surgeon: Cleotis Nipper, MD;  Location: [facility] ENDOSCOPY;  Service: Endoscopy;  Laterality: N/A;  will probably want to do at bedside in ICU---bed placement pending    History  Smoking status  . Current Some Day Smoker -- 1.50 packs/day  . Types: Cigarettes  Smokeless tobacco  . Not on file    History  Alcohol Use  . Yes    Comment: occasionally    Family History  Problem Relation Age of Onset  . Hypertension Mother 53  . Hypertension Father 56  . Hypertension Sister   . Hypertension Brother   . Hypertension Sister   . Hypertension Brother   . Hypertension Brother   . Hypertension Brother   . Hypertension Brother     Review of Systems: The review of systems is per the HPI.  All other systems were reviewed and are negative.  Physical Exam: BP 110/60  Pulse 82  Ht 5\' 10"  (1.778 m)  Wt  178 lb 6.4 oz (80.922 kg)  BMI 25.60 kg/m2  SpO2 99% Patient is very pleasant and in no acute distress. Weight is down a few pounds. Skin is warm and dry. Color  is normal.  HEENT is unremarkable but very poor dentition, lots of missing teeth, loose teeth, etc. Normocephalic/atraumatic. PERRL. Sclera are nonicteric. Neck is supple. No masses. No JVD. Lungs are clear. Cardiac exam shows an irregular rhythm. Rate is ok.  Abdomen is soft. Extremities are with 1+ edema. Gait and ROM are intact. Using a cane. No gross neurologic deficits noted.  Wt Readings from Last 3 Encounters:  12/26/13 178 lb 6.4 oz (80.922 kg)  11/24/13 181 lb (82.101 kg)  11/23/13 179 lb (81.194 kg)     LABORATORY DATA: BMET and CBC pending  Lab Results  Component Value Date   WBC 8.7 09/24/2013   HGB 14.0 09/24/2013   HCT 40.6 09/24/2013   PLT 115* 09/24/2013   GLUCOSE 103* 09/25/2013   CHOL 120 09/20/2013   TRIG 68 09/20/2013   HDL 39* 09/20/2013   LDLCALC 67 09/20/2013   ALT 22 09/19/2013   AST 20 09/19/2013   NA 141 09/25/2013   K 3.0* 09/25/2013   CL 100 09/25/2013   CREATININE 1.42* 09/26/2013   BUN 21 09/25/2013   CO2 28 09/25/2013   INR 1.01 09/19/2013   HGBA1C 5.0 09/20/2013   Echo Study Conclusions from October 2014  - Left ventricle: The cavity size was mildly dilated. Wall thickness was normal. Systolic function was severely reduced. The estimated ejection fraction was in the range of 20% to 25%. Diffuse hypokinesis. Features are consistent with a pseudonormal left ventricular filling pattern, with concomitant abnormal relaxation and increased filling pressure (grade 2 diastolic dysfunction). E/medial e' > 15 suggesting LV end diastolic pressure at least 20 mmHg. - Aortic valve: There was no stenosis. - Mitral valve: Mildly calcified annulus. Mildly calcified leaflets . Mild regurgitation. - Left atrium: The atrium was moderately dilated. - Right ventricle: The cavity size was normal.  Systolic function was mildly reduced. - Right atrium: The atrium was mildly to moderately dilated. - Pulmonary arteries: No complete TR doppler jet so unable to estimate PA systolic pressure. - Systemic veins: IVC measured 2.1 cm with some respirophasic variation, suggesting RA pressure 10 mmHg    Assessment / Plan:  1. Atrial fib/flutter - he remains in atrial fib - would continue with rate control and anticoagulation. Fortunately, no more falls reported. Will check follow up labs today.   2. CVA - seems to be getting stronger  3. Substance abuse - still with ongoing tobacco and alcohol use - not interested in stopping  4. Systolic HF - EF of 36% -  Not going to be able to cut back on his salt use due to his social situation. He continues to eat lots of canned/box foods. On just low dose Coreg and on ARB. I would favor conservative management. No changes today made.   See him back in about 3 months. Overall prognosis is tenuous at best.   Patient is agreeable to this plan and will call if any problems develop in the interim.   Burtis Junes, RN, [place]  [address] [place]  [place], [place] [address]

## 2013-12-26 NOTE — Patient Instructions (Addendum)
I think you are doing ok  Stay on your current medicines - will try to give you some Eliquis today  We need to check labs today  See Dr. Acie Fredrickson in 3 months  Call the [place] office at [phone] if you have any questions, problems or concerns.

## 2013-12-27 ENCOUNTER — Other Ambulatory Visit: Payer: Self-pay

## 2013-12-27 DIAGNOSIS — D649 Anemia, unspecified: Secondary | ICD-10-CM

## 2014-01-04 ENCOUNTER — Telehealth: Payer: Self-pay | Admitting: Nurse Practitioner

## 2014-01-04 NOTE — Telephone Encounter (Signed)
Janett Billow calls today b/c pt has been out of his Carvedilol for 2 weeks ( pt forgot to tell Cecille Rubin at 12/26/13 ov) He will be able to fill his prescription Sunday 01/07/14.  She would like to know if he should restart the Carvedilol at the same dose or a lesser dose in light of his heart rate being 60? I told her I would forward this to [place] our office would give a call back tomorrow.  Janett Billow states that would be fine & that it was not an emergency Horton Chin RN

## 2014-01-04 NOTE — Telephone Encounter (Signed)
New Message  Jessica with [facility] management Called states that the pt has been off of carvedilol for 2 weeks and his heart rate today was 60. BP was 138/78. Please  Call back to discuss if the pt should restart this medication.

## 2014-01-05 ENCOUNTER — Other Ambulatory Visit: Payer: Self-pay | Admitting: *Deleted

## 2014-01-05 ENCOUNTER — Telehealth: Payer: Self-pay | Admitting: *Deleted

## 2014-01-05 MED ORDER — CARVEDILOL 3.125 MG PO TABS: 3.1250 mg | ORAL_TABLET | Freq: Two times a day (BID) | ORAL | Status: DC

## 2014-01-05 NOTE — Telephone Encounter (Signed)
S/w pt refilled coreg ( 3.125 mg ) bid  [facility] pt aware

## 2014-01-05 NOTE — Telephone Encounter (Signed)
According to my last note - he is just on 3.125 mg BID - I would continue with this.

## 2014-01-10 ENCOUNTER — Other Ambulatory Visit (INDEPENDENT_AMBULATORY_CARE_PROVIDER_SITE_OTHER): Payer: Medicare Other

## 2014-01-10 DIAGNOSIS — D649 Anemia, unspecified: Secondary | ICD-10-CM

## 2014-01-10 LAB — CBC
HCT: 35.1 % — ABNORMAL LOW (ref 39.0–52.0)
Hemoglobin: 11.4 g/dL — ABNORMAL LOW (ref 13.0–17.0)
MCHC: 32.5 g/dL (ref 30.0–36.0)
MCV: 97.8 fl (ref 78.0–100.0)
Platelets: 169 10*3/uL (ref 150.0–400.0)
RBC: 3.59 Mil/uL — ABNORMAL LOW (ref 4.22–5.81)
RDW: 19.1 % — ABNORMAL HIGH (ref 11.5–14.6)
WBC: 7.6 10*3/uL (ref 4.5–10.5)

## 2014-02-26 ENCOUNTER — Telehealth: Payer: Self-pay | Admitting: *Deleted

## 2014-02-26 NOTE — Telephone Encounter (Signed)
PA to [facility] for patients eliquis. 

## 2014-05-03 ENCOUNTER — Telehealth: Payer: Self-pay | Admitting: *Deleted

## 2014-05-03 NOTE — Telephone Encounter (Signed)
Received from [place], Lurline Del, application for patient medication assistance for patient [facility] AND ELIQUIS, completed with Dr Acie Fredrickson signature along with scripts, faxed back to [place] Del, patient needs to sign forms.

## 2014-11-15 ENCOUNTER — Encounter (HOSPITAL_COMMUNITY): Payer: Self-pay | Admitting: Internal Medicine

## 2015-01-03 DIAGNOSIS — H4011X3 Primary open-angle glaucoma, severe stage: Secondary | ICD-10-CM | POA: Diagnosis not present

## 2015-01-03 DIAGNOSIS — H4011X2 Primary open-angle glaucoma, moderate stage: Secondary | ICD-10-CM | POA: Diagnosis not present

## 2015-01-07 DIAGNOSIS — R001 Bradycardia, unspecified: Secondary | ICD-10-CM | POA: Diagnosis not present

## 2015-01-07 DIAGNOSIS — I10 Essential (primary) hypertension: Secondary | ICD-10-CM | POA: Diagnosis not present

## 2015-01-07 DIAGNOSIS — I6789 Other cerebrovascular disease: Secondary | ICD-10-CM | POA: Diagnosis not present

## 2015-01-07 DIAGNOSIS — Z23 Encounter for immunization: Secondary | ICD-10-CM | POA: Diagnosis not present

## 2015-01-07 DIAGNOSIS — J449 Chronic obstructive pulmonary disease, unspecified: Secondary | ICD-10-CM | POA: Diagnosis not present

## 2015-01-09 ENCOUNTER — Encounter (INDEPENDENT_AMBULATORY_CARE_PROVIDER_SITE_OTHER): Payer: Medicare Other | Admitting: Ophthalmology

## 2015-01-09 DIAGNOSIS — H43813 Vitreous degeneration, bilateral: Secondary | ICD-10-CM | POA: Diagnosis not present

## 2015-01-09 DIAGNOSIS — I10 Essential (primary) hypertension: Secondary | ICD-10-CM

## 2015-01-09 DIAGNOSIS — H3531 Nonexudative age-related macular degeneration: Secondary | ICD-10-CM | POA: Diagnosis not present

## 2015-01-09 DIAGNOSIS — H35033 Hypertensive retinopathy, bilateral: Secondary | ICD-10-CM

## 2015-03-14 DIAGNOSIS — E785 Hyperlipidemia, unspecified: Secondary | ICD-10-CM | POA: Diagnosis not present

## 2015-03-14 DIAGNOSIS — I10 Essential (primary) hypertension: Secondary | ICD-10-CM | POA: Diagnosis not present

## 2015-03-14 DIAGNOSIS — Z136 Encounter for screening for cardiovascular disorders: Secondary | ICD-10-CM | POA: Diagnosis not present

## 2015-03-15 DIAGNOSIS — I10 Essential (primary) hypertension: Secondary | ICD-10-CM | POA: Diagnosis not present

## 2015-03-15 DIAGNOSIS — I5020 Unspecified systolic (congestive) heart failure: Secondary | ICD-10-CM | POA: Diagnosis not present

## 2015-03-15 DIAGNOSIS — J449 Chronic obstructive pulmonary disease, unspecified: Secondary | ICD-10-CM | POA: Diagnosis not present

## 2015-03-15 DIAGNOSIS — E782 Mixed hyperlipidemia: Secondary | ICD-10-CM | POA: Diagnosis not present

## 2015-03-28 DIAGNOSIS — I10 Essential (primary) hypertension: Secondary | ICD-10-CM | POA: Diagnosis not present

## 2015-03-29 DIAGNOSIS — J449 Chronic obstructive pulmonary disease, unspecified: Secondary | ICD-10-CM | POA: Diagnosis not present

## 2015-03-29 DIAGNOSIS — E876 Hypokalemia: Secondary | ICD-10-CM | POA: Diagnosis not present

## 2015-03-29 DIAGNOSIS — J441 Chronic obstructive pulmonary disease with (acute) exacerbation: Secondary | ICD-10-CM | POA: Diagnosis not present

## 2015-03-29 DIAGNOSIS — I10 Essential (primary) hypertension: Secondary | ICD-10-CM | POA: Diagnosis not present

## 2015-04-09 DIAGNOSIS — C61 Malignant neoplasm of prostate: Secondary | ICD-10-CM | POA: Diagnosis not present

## 2015-04-12 DIAGNOSIS — C61 Malignant neoplasm of prostate: Secondary | ICD-10-CM | POA: Diagnosis not present

## 2015-04-18 DIAGNOSIS — H3531 Nonexudative age-related macular degeneration: Secondary | ICD-10-CM | POA: Diagnosis not present

## 2015-04-18 DIAGNOSIS — H4011X3 Primary open-angle glaucoma, severe stage: Secondary | ICD-10-CM | POA: Diagnosis not present

## 2015-04-18 DIAGNOSIS — H2513 Age-related nuclear cataract, bilateral: Secondary | ICD-10-CM | POA: Diagnosis not present

## 2015-04-18 DIAGNOSIS — H4011X2 Primary open-angle glaucoma, moderate stage: Secondary | ICD-10-CM | POA: Diagnosis not present

## 2015-04-18 DIAGNOSIS — H25013 Cortical age-related cataract, bilateral: Secondary | ICD-10-CM | POA: Diagnosis not present

## 2015-04-28 DIAGNOSIS — J441 Chronic obstructive pulmonary disease with (acute) exacerbation: Secondary | ICD-10-CM | POA: Diagnosis not present

## 2015-05-05 ENCOUNTER — Inpatient Hospital Stay (HOSPITAL_COMMUNITY)
Admission: EM | Admit: 2015-05-05 | Discharge: 2015-05-09 | DRG: 480 | Disposition: A | Discharge status: Skilled Nursing Facility | Payer: Medicare Other | Attending: Internal Medicine | Admitting: Internal Medicine

## 2015-05-05 ENCOUNTER — Encounter (HOSPITAL_COMMUNITY): Payer: Self-pay | Admitting: Family Medicine

## 2015-05-05 DIAGNOSIS — S72009A Fracture of unspecified part of neck of unspecified femur, initial encounter for closed fracture: Secondary | ICD-10-CM | POA: Diagnosis not present

## 2015-05-05 DIAGNOSIS — J9610 Chronic respiratory failure, unspecified whether with hypoxia or hypercapnia: Secondary | ICD-10-CM | POA: Diagnosis present

## 2015-05-05 DIAGNOSIS — Z79899 Other long term (current) drug therapy: Secondary | ICD-10-CM

## 2015-05-05 DIAGNOSIS — M810 Age-related osteoporosis without current pathological fracture: Secondary | ICD-10-CM | POA: Diagnosis present

## 2015-05-05 DIAGNOSIS — Z7901 Long term (current) use of anticoagulants: Secondary | ICD-10-CM

## 2015-05-05 DIAGNOSIS — N179 Acute kidney failure, unspecified: Secondary | ICD-10-CM | POA: Diagnosis not present

## 2015-05-05 DIAGNOSIS — F1010 Alcohol abuse, uncomplicated: Secondary | ICD-10-CM | POA: Diagnosis present

## 2015-05-05 DIAGNOSIS — S51819A Laceration without foreign body of unspecified forearm, initial encounter: Secondary | ICD-10-CM | POA: Diagnosis not present

## 2015-05-05 DIAGNOSIS — N183 Chronic kidney disease, stage 3 unspecified: Secondary | ICD-10-CM | POA: Diagnosis present

## 2015-05-05 DIAGNOSIS — S79911A Unspecified injury of right hip, initial encounter: Secondary | ICD-10-CM | POA: Diagnosis not present

## 2015-05-05 DIAGNOSIS — Z8546 Personal history of malignant neoplasm of prostate: Secondary | ICD-10-CM | POA: Diagnosis not present

## 2015-05-05 DIAGNOSIS — I4891 Unspecified atrial fibrillation: Secondary | ICD-10-CM | POA: Diagnosis present

## 2015-05-05 DIAGNOSIS — I472 Ventricular tachycardia: Secondary | ICD-10-CM | POA: Diagnosis present

## 2015-05-05 DIAGNOSIS — Z8249 Family history of ischemic heart disease and other diseases of the circulatory system: Secondary | ICD-10-CM

## 2015-05-05 DIAGNOSIS — I639 Cerebral infarction, unspecified: Secondary | ICD-10-CM | POA: Diagnosis present

## 2015-05-05 DIAGNOSIS — F17210 Nicotine dependence, cigarettes, uncomplicated: Secondary | ICD-10-CM | POA: Diagnosis not present

## 2015-05-05 DIAGNOSIS — I4729 Other ventricular tachycardia: Secondary | ICD-10-CM

## 2015-05-05 DIAGNOSIS — W19XXXA Unspecified fall, initial encounter: Secondary | ICD-10-CM

## 2015-05-05 DIAGNOSIS — Z8673 Personal history of transient ischemic attack (TIA), and cerebral infarction without residual deficits: Secondary | ICD-10-CM | POA: Diagnosis not present

## 2015-05-05 DIAGNOSIS — D696 Thrombocytopenia, unspecified: Secondary | ICD-10-CM | POA: Diagnosis not present

## 2015-05-05 DIAGNOSIS — Y92000 Kitchen of unspecified non-institutional (private) residence as  the place of occurrence of the external cause: Secondary | ICD-10-CM

## 2015-05-05 DIAGNOSIS — R0600 Dyspnea, unspecified: Secondary | ICD-10-CM

## 2015-05-05 DIAGNOSIS — J449 Chronic obstructive pulmonary disease, unspecified: Secondary | ICD-10-CM | POA: Diagnosis not present

## 2015-05-05 DIAGNOSIS — S72031A Displaced midcervical fracture of right femur, initial encounter for closed fracture: Principal | ICD-10-CM | POA: Diagnosis present

## 2015-05-05 DIAGNOSIS — R030 Elevated blood-pressure reading, without diagnosis of hypertension: Secondary | ICD-10-CM | POA: Diagnosis not present

## 2015-05-05 DIAGNOSIS — R339 Retention of urine, unspecified: Secondary | ICD-10-CM | POA: Diagnosis present

## 2015-05-05 DIAGNOSIS — I10 Essential (primary) hypertension: Secondary | ICD-10-CM | POA: Diagnosis present

## 2015-05-05 DIAGNOSIS — M25551 Pain in right hip: Secondary | ICD-10-CM | POA: Diagnosis not present

## 2015-05-05 DIAGNOSIS — I5042 Chronic combined systolic (congestive) and diastolic (congestive) heart failure: Secondary | ICD-10-CM | POA: Diagnosis not present

## 2015-05-05 DIAGNOSIS — S51812A Laceration without foreign body of left forearm, initial encounter: Secondary | ICD-10-CM

## 2015-05-05 DIAGNOSIS — I509 Heart failure, unspecified: Secondary | ICD-10-CM | POA: Diagnosis not present

## 2015-05-05 DIAGNOSIS — I482 Chronic atrial fibrillation: Secondary | ICD-10-CM | POA: Diagnosis not present

## 2015-05-05 DIAGNOSIS — I129 Hypertensive chronic kidney disease with stage 1 through stage 4 chronic kidney disease, or unspecified chronic kidney disease: Secondary | ICD-10-CM | POA: Diagnosis not present

## 2015-05-05 DIAGNOSIS — T148XXA Other injury of unspecified body region, initial encounter: Secondary | ICD-10-CM

## 2015-05-05 DIAGNOSIS — E785 Hyperlipidemia, unspecified: Secondary | ICD-10-CM | POA: Diagnosis present

## 2015-05-05 DIAGNOSIS — S72011A Unspecified intracapsular fracture of right femur, initial encounter for closed fracture: Secondary | ICD-10-CM

## 2015-05-05 DIAGNOSIS — I2510 Atherosclerotic heart disease of native coronary artery without angina pectoris: Secondary | ICD-10-CM | POA: Diagnosis present

## 2015-05-05 DIAGNOSIS — S299XXA Unspecified injury of thorax, initial encounter: Secondary | ICD-10-CM | POA: Diagnosis not present

## 2015-05-05 DIAGNOSIS — Y92009 Unspecified place in unspecified non-institutional (private) residence as the place of occurrence of the external cause: Secondary | ICD-10-CM

## 2015-05-05 DIAGNOSIS — W010XXA Fall on same level from slipping, tripping and stumbling without subsequent striking against object, initial encounter: Secondary | ICD-10-CM | POA: Diagnosis present

## 2015-05-05 DIAGNOSIS — I5043 Acute on chronic combined systolic (congestive) and diastolic (congestive) heart failure: Secondary | ICD-10-CM | POA: Diagnosis not present

## 2015-05-05 DIAGNOSIS — C61 Malignant neoplasm of prostate: Secondary | ICD-10-CM | POA: Diagnosis present

## 2015-05-05 HISTORY — DX: Conduction disorder, unspecified: I45.9

## 2015-05-05 HISTORY — DX: Unspecified atrial flutter: I48.92

## 2015-05-05 HISTORY — DX: Peripheral vascular disease, unspecified: I73.9

## 2015-05-05 HISTORY — DX: Cardiomyopathy, unspecified: I42.9

## 2015-05-05 HISTORY — DX: Atherosclerotic heart disease of native coronary artery without angina pectoris: I25.10

## 2015-05-05 HISTORY — DX: Other shock: R57.8

## 2015-05-05 HISTORY — DX: Chronic combined systolic (congestive) and diastolic (congestive) heart failure: I50.42

## 2015-05-05 HISTORY — DX: Fracture of unspecified part of neck of left femur, initial encounter for closed fracture: S72.002A

## 2015-05-05 HISTORY — DX: Gastro-esophageal reflux disease without esophagitis: K21.9

## 2015-05-05 HISTORY — DX: Unspecified atrial fibrillation: I48.91

## 2015-05-05 HISTORY — DX: Gastrointestinal hemorrhage, unspecified: K92.2

## 2015-05-05 HISTORY — DX: Thrombocytopenia, unspecified: D69.6

## 2015-05-05 HISTORY — DX: Alcohol abuse, uncomplicated: F10.10

## 2015-05-05 HISTORY — DX: Cerebral infarction, unspecified: I63.9

## 2015-05-05 HISTORY — DX: Ventricular tachycardia: I47.2

## 2015-05-05 HISTORY — DX: Fracture of unspecified part of neck of unspecified femur, initial encounter for closed fracture: S72.009A

## 2015-05-05 HISTORY — DX: Pneumonia, unspecified organism: J18.9

## 2015-05-05 NOTE — ED Notes (Signed)
Per EMS, patient is from home. Pt ambulates with a cane. He was using the bathroom, slipped in the bathroom. Pt complaining of right hip pain. Denies neck and back pain. Pt has a left forearm and left knuckle skin tear. Pt crawled to the couch and that was were he was located when EMS arrived. Right left is not shorten and slightly outward rotated.

## 2015-05-05 NOTE — ED Notes (Signed)
Offered pain medication but reports he does not want medication. He reports while lying still, he does not hurt. Pt reports he was in the living when he fell.

## 2015-05-05 NOTE — ED Notes (Signed)
Bed: WA17 Expected date:  Expected time:  Means of arrival:  Comments: EMS - fall / hip pain

## 2015-05-06 ENCOUNTER — Inpatient Hospital Stay (HOSPITAL_COMMUNITY): Payer: Medicare Other

## 2015-05-06 ENCOUNTER — Encounter (HOSPITAL_COMMUNITY)
Admission: EM | Disposition: A | Discharge status: Skilled Nursing Facility | Payer: Self-pay | Source: Home / Self Care | Attending: Internal Medicine

## 2015-05-06 ENCOUNTER — Emergency Department (HOSPITAL_COMMUNITY): Payer: Medicare Other

## 2015-05-06 DIAGNOSIS — I509 Heart failure, unspecified: Secondary | ICD-10-CM | POA: Diagnosis not present

## 2015-05-06 DIAGNOSIS — F1010 Alcohol abuse, uncomplicated: Secondary | ICD-10-CM | POA: Diagnosis present

## 2015-05-06 DIAGNOSIS — E785 Hyperlipidemia, unspecified: Secondary | ICD-10-CM | POA: Diagnosis present

## 2015-05-06 DIAGNOSIS — S72009A Fracture of unspecified part of neck of unspecified femur, initial encounter for closed fracture: Secondary | ICD-10-CM

## 2015-05-06 DIAGNOSIS — I482 Chronic atrial fibrillation: Secondary | ICD-10-CM

## 2015-05-06 DIAGNOSIS — R1311 Dysphagia, oral phase: Secondary | ICD-10-CM | POA: Diagnosis not present

## 2015-05-06 DIAGNOSIS — M25551 Pain in right hip: Secondary | ICD-10-CM | POA: Diagnosis not present

## 2015-05-06 DIAGNOSIS — M810 Age-related osteoporosis without current pathological fracture: Secondary | ICD-10-CM | POA: Diagnosis present

## 2015-05-06 DIAGNOSIS — S51819A Laceration without foreign body of unspecified forearm, initial encounter: Secondary | ICD-10-CM | POA: Diagnosis present

## 2015-05-06 DIAGNOSIS — F10920 Alcohol use, unspecified with intoxication, uncomplicated: Secondary | ICD-10-CM | POA: Diagnosis not present

## 2015-05-06 DIAGNOSIS — I2510 Atherosclerotic heart disease of native coronary artery without angina pectoris: Secondary | ICD-10-CM

## 2015-05-06 DIAGNOSIS — C61 Malignant neoplasm of prostate: Secondary | ICD-10-CM

## 2015-05-06 DIAGNOSIS — S79911A Unspecified injury of right hip, initial encounter: Secondary | ICD-10-CM | POA: Diagnosis not present

## 2015-05-06 DIAGNOSIS — S51812A Laceration without foreign body of left forearm, initial encounter: Secondary | ICD-10-CM

## 2015-05-06 DIAGNOSIS — Z8249 Family history of ischemic heart disease and other diseases of the circulatory system: Secondary | ICD-10-CM | POA: Diagnosis not present

## 2015-05-06 DIAGNOSIS — Z79899 Other long term (current) drug therapy: Secondary | ICD-10-CM | POA: Diagnosis not present

## 2015-05-06 DIAGNOSIS — N183 Chronic kidney disease, stage 3 (moderate): Secondary | ICD-10-CM

## 2015-05-06 DIAGNOSIS — Z8673 Personal history of transient ischemic attack (TIA), and cerebral infarction without residual deficits: Secondary | ICD-10-CM | POA: Diagnosis not present

## 2015-05-06 DIAGNOSIS — R0600 Dyspnea, unspecified: Secondary | ICD-10-CM | POA: Diagnosis not present

## 2015-05-06 DIAGNOSIS — Y92000 Kitchen of unspecified non-institutional (private) residence as  the place of occurrence of the external cause: Secondary | ICD-10-CM | POA: Diagnosis not present

## 2015-05-06 DIAGNOSIS — J449 Chronic obstructive pulmonary disease, unspecified: Secondary | ICD-10-CM | POA: Diagnosis present

## 2015-05-06 DIAGNOSIS — J984 Other disorders of lung: Secondary | ICD-10-CM | POA: Diagnosis not present

## 2015-05-06 DIAGNOSIS — I5042 Chronic combined systolic (congestive) and diastolic (congestive) heart failure: Secondary | ICD-10-CM | POA: Diagnosis not present

## 2015-05-06 DIAGNOSIS — Z8546 Personal history of malignant neoplasm of prostate: Secondary | ICD-10-CM | POA: Diagnosis not present

## 2015-05-06 DIAGNOSIS — D696 Thrombocytopenia, unspecified: Secondary | ICD-10-CM | POA: Diagnosis present

## 2015-05-06 DIAGNOSIS — S72001A Fracture of unspecified part of neck of right femur, initial encounter for closed fracture: Secondary | ICD-10-CM | POA: Diagnosis not present

## 2015-05-06 DIAGNOSIS — S299XXA Unspecified injury of thorax, initial encounter: Secondary | ICD-10-CM | POA: Diagnosis not present

## 2015-05-06 DIAGNOSIS — N179 Acute kidney failure, unspecified: Secondary | ICD-10-CM | POA: Diagnosis not present

## 2015-05-06 DIAGNOSIS — M21251 Flexion deformity, right hip: Secondary | ICD-10-CM | POA: Diagnosis not present

## 2015-05-06 DIAGNOSIS — I10 Essential (primary) hypertension: Secondary | ICD-10-CM

## 2015-05-06 DIAGNOSIS — S72031A Displaced midcervical fracture of right femur, initial encounter for closed fracture: Secondary | ICD-10-CM | POA: Diagnosis not present

## 2015-05-06 DIAGNOSIS — F17210 Nicotine dependence, cigarettes, uncomplicated: Secondary | ICD-10-CM | POA: Diagnosis present

## 2015-05-06 DIAGNOSIS — M6281 Muscle weakness (generalized): Secondary | ICD-10-CM | POA: Diagnosis not present

## 2015-05-06 DIAGNOSIS — R339 Retention of urine, unspecified: Secondary | ICD-10-CM | POA: Diagnosis present

## 2015-05-06 DIAGNOSIS — I5043 Acute on chronic combined systolic (congestive) and diastolic (congestive) heart failure: Secondary | ICD-10-CM | POA: Diagnosis not present

## 2015-05-06 DIAGNOSIS — I472 Ventricular tachycardia: Secondary | ICD-10-CM | POA: Diagnosis not present

## 2015-05-06 DIAGNOSIS — Z9181 History of falling: Secondary | ICD-10-CM | POA: Diagnosis not present

## 2015-05-06 DIAGNOSIS — Z7901 Long term (current) use of anticoagulants: Secondary | ICD-10-CM | POA: Diagnosis not present

## 2015-05-06 DIAGNOSIS — S7291XD Unspecified fracture of right femur, subsequent encounter for closed fracture with routine healing: Secondary | ICD-10-CM | POA: Diagnosis not present

## 2015-05-06 DIAGNOSIS — Z720 Tobacco use: Secondary | ICD-10-CM | POA: Diagnosis not present

## 2015-05-06 DIAGNOSIS — J9610 Chronic respiratory failure, unspecified whether with hypoxia or hypercapnia: Secondary | ICD-10-CM | POA: Diagnosis not present

## 2015-05-06 DIAGNOSIS — R278 Other lack of coordination: Secondary | ICD-10-CM | POA: Diagnosis not present

## 2015-05-06 DIAGNOSIS — W010XXA Fall on same level from slipping, tripping and stumbling without subsequent striking against object, initial encounter: Secondary | ICD-10-CM | POA: Diagnosis present

## 2015-05-06 DIAGNOSIS — I129 Hypertensive chronic kidney disease with stage 1 through stage 4 chronic kidney disease, or unspecified chronic kidney disease: Secondary | ICD-10-CM | POA: Diagnosis present

## 2015-05-06 HISTORY — DX: Fracture of unspecified part of neck of unspecified femur, initial encounter for closed fracture: S72.009A

## 2015-05-06 HISTORY — DX: Chronic combined systolic (congestive) and diastolic (congestive) heart failure: I50.42

## 2015-05-06 LAB — SURGICAL PCR SCREEN
MRSA, PCR: NEGATIVE
STAPHYLOCOCCUS AUREUS: NEGATIVE

## 2015-05-06 LAB — URINALYSIS, ROUTINE W REFLEX MICROSCOPIC
BILIRUBIN URINE: NEGATIVE
Glucose, UA: NEGATIVE mg/dL
HGB URINE DIPSTICK: NEGATIVE
Ketones, ur: NEGATIVE mg/dL
Leukocytes, UA: NEGATIVE
Nitrite: NEGATIVE
PH: 5.5 (ref 5.0–8.0)
Protein, ur: 100 mg/dL — AB
SPECIFIC GRAVITY, URINE: 1.012 (ref 1.005–1.030)
Urobilinogen, UA: 0.2 mg/dL (ref 0.0–1.0)

## 2015-05-06 LAB — COMPREHENSIVE METABOLIC PANEL
ALBUMIN: 4.2 g/dL (ref 3.5–5.0)
ALK PHOS: 85 U/L (ref 38–126)
ALT: 15 U/L — ABNORMAL LOW (ref 17–63)
ALT: 17 U/L (ref 17–63)
ANION GAP: 11 (ref 5–15)
ANION GAP: 11 (ref 5–15)
AST: 18 U/L (ref 15–41)
AST: 17 U/L (ref 15–41)
Albumin: 4.1 g/dL (ref 3.5–5.0)
Alkaline Phosphatase: 88 U/L (ref 38–126)
BILIRUBIN TOTAL: 0.6 mg/dL (ref 0.3–1.2)
BUN: 39 mg/dL — ABNORMAL HIGH (ref 6–20)
BUN: 41 mg/dL — AB (ref 6–20)
CALCIUM: 9.9 mg/dL (ref 8.9–10.3)
CHLORIDE: 108 mmol/L (ref 101–111)
CO2: 19 mmol/L — ABNORMAL LOW (ref 22–32)
CO2: 22 mmol/L (ref 22–32)
CREATININE: 1.65 mg/dL — AB (ref 0.61–1.24)
Calcium: 9.7 mg/dL (ref 8.9–10.3)
Chloride: 106 mmol/L (ref 101–111)
Creatinine, Ser: 1.57 mg/dL — ABNORMAL HIGH (ref 0.61–1.24)
GFR calc Af Amer: 47 mL/min — ABNORMAL LOW (ref >60)
GFR, EST AFRICAN AMERICAN: 45 mL/min — AB (ref >60)
GFR, EST NON AFRICAN AMERICAN: 38 mL/min — AB (ref >60)
GFR, EST NON AFRICAN AMERICAN: 41 mL/min — AB (ref >60)
GLUCOSE: 117 mg/dL — AB (ref 65–99)
Glucose, Bld: 115 mg/dL — ABNORMAL HIGH (ref 65–99)
POTASSIUM: 4 mmol/L (ref 3.5–5.1)
Potassium: 4.1 mmol/L (ref 3.5–5.1)
SODIUM: 138 mmol/L (ref 135–145)
SODIUM: 139 mmol/L (ref 135–145)
TOTAL PROTEIN: 7.3 g/dL (ref 6.5–8.1)
Total Bilirubin: 1 mg/dL (ref 0.3–1.2)
Total Protein: 7.3 g/dL (ref 6.5–8.1)

## 2015-05-06 LAB — CBC WITH DIFFERENTIAL/PLATELET
BASOS ABS: 0.1 10*3/uL (ref 0.0–0.1)
Basophils Absolute: 0.1 10*3/uL (ref 0.0–0.1)
Basophils Relative: 1 % (ref 0–1)
Basophils Relative: 1 % (ref 0–1)
EOS PCT: 1 % (ref 0–5)
EOS PCT: 1 % (ref 0–5)
Eosinophils Absolute: 0.1 10*3/uL (ref 0.0–0.7)
Eosinophils Absolute: 0.2 10*3/uL (ref 0.0–0.7)
HCT: 39.9 % (ref 39.0–52.0)
HEMATOCRIT: 40.4 % (ref 39.0–52.0)
HEMOGLOBIN: 12.9 g/dL — AB (ref 13.0–17.0)
Hemoglobin: 12.9 g/dL — ABNORMAL LOW (ref 13.0–17.0)
LYMPHS ABS: 1.8 10*3/uL (ref 0.7–4.0)
LYMPHS ABS: 1.9 10*3/uL (ref 0.7–4.0)
LYMPHS PCT: 15 % (ref 12–46)
Lymphocytes Relative: 18 % (ref 12–46)
MCH: 29.4 pg (ref 26.0–34.0)
MCH: 29.4 pg (ref 26.0–34.0)
MCHC: 31.9 g/dL (ref 30.0–36.0)
MCHC: 32.3 g/dL (ref 30.0–36.0)
MCV: 90.9 fL (ref 78.0–100.0)
MCV: 92 fL (ref 78.0–100.0)
Monocytes Absolute: 0.7 10*3/uL (ref 0.1–1.0)
Monocytes Absolute: 1 10*3/uL (ref 0.1–1.0)
Monocytes Relative: 5 % (ref 3–12)
Monocytes Relative: 9 % (ref 3–12)
NEUTROS PCT: 71 % (ref 43–77)
NEUTROS PCT: 78 % — AB (ref 43–77)
Neutro Abs: 8 10*3/uL — ABNORMAL HIGH (ref 1.7–7.7)
Neutro Abs: 9.5 10*3/uL — ABNORMAL HIGH (ref 1.7–7.7)
PLATELETS: 130 10*3/uL — AB (ref 150–400)
Platelets: 122 10*3/uL — ABNORMAL LOW (ref 150–400)
RBC: 4.39 MIL/uL (ref 4.22–5.81)
RBC: 4.39 MIL/uL (ref 4.22–5.81)
RDW: 13 % (ref 11.5–15.5)
RDW: 13 % (ref 11.5–15.5)
WBC: 11.1 10*3/uL — AB (ref 4.0–10.5)
WBC: 12.2 10*3/uL — AB (ref 4.0–10.5)

## 2015-05-06 LAB — APTT: aPTT: 31 seconds (ref 24–37)

## 2015-05-06 LAB — BASIC METABOLIC PANEL
ANION GAP: 13 (ref 5–15)
BUN: 35 mg/dL — ABNORMAL HIGH (ref 6–20)
CHLORIDE: 101 mmol/L (ref 101–111)
CO2: 25 mmol/L (ref 22–32)
CREATININE: 1.58 mg/dL — AB (ref 0.61–1.24)
Calcium: 9.7 mg/dL (ref 8.9–10.3)
GFR calc Af Amer: 47 mL/min — ABNORMAL LOW (ref >60)
GFR calc non Af Amer: 41 mL/min — ABNORMAL LOW (ref >60)
GLUCOSE: 149 mg/dL — AB (ref 65–99)
Potassium: 3.6 mmol/L (ref 3.5–5.1)
SODIUM: 139 mmol/L (ref 135–145)

## 2015-05-06 LAB — BRAIN NATRIURETIC PEPTIDE: B Natriuretic Peptide: 510.3 pg/mL — ABNORMAL HIGH (ref 0.0–100.0)

## 2015-05-06 LAB — URINE MICROSCOPIC-ADD ON

## 2015-05-06 LAB — PROTIME-INR
INR: 1.09 (ref 0.00–1.49)
Prothrombin Time: 14.3 seconds (ref 11.6–15.2)

## 2015-05-06 LAB — MAGNESIUM: MAGNESIUM: 1.4 mg/dL — AB (ref 1.7–2.4)

## 2015-05-06 SURGERY — FIXATION, FEMUR, NECK, PERCUTANEOUS, USING SCREW
Anesthesia: General | Laterality: Right

## 2015-05-06 MED ORDER — VITAMIN B-1 100 MG PO TABS
100.0000 mg | ORAL_TABLET | Freq: Every day | ORAL | Status: DC
  Administered 2015-05-06 – 2015-05-09 (×3): 100 mg via ORAL
  Filled 2015-05-06 (×4): qty 1

## 2015-05-06 MED ORDER — HYDROCODONE-ACETAMINOPHEN 5-325 MG PO TABS
1.0000 | ORAL_TABLET | Freq: Four times a day (QID) | ORAL | Status: DC | PRN
  Administered 2015-05-06 – 2015-05-08 (×3): 2 via ORAL
  Filled 2015-05-06 (×3): qty 2

## 2015-05-06 MED ORDER — MORPHINE SULFATE 2 MG/ML IJ SOLN: 1.0000 mg | INTRAMUSCULAR | Status: DC | PRN

## 2015-05-06 MED ORDER — FERROUS SULFATE 325 (65 FE) MG PO TABS
325.0000 mg | ORAL_TABLET | Freq: Every day | ORAL | Status: DC
Start: 2015-05-06 — End: 2015-05-09
  Administered 2015-05-06 – 2015-05-09 (×3): 325 mg via ORAL
  Filled 2015-05-06 (×4): qty 1

## 2015-05-06 MED ORDER — LORAZEPAM 2 MG/ML IJ SOLN: 1.0000 mg | Freq: Four times a day (QID) | INTRAMUSCULAR | Status: DC | PRN

## 2015-05-06 MED ORDER — LOSARTAN POTASSIUM 50 MG PO TABS
100.0000 mg | ORAL_TABLET | Freq: Every day | ORAL | Status: DC
  Administered 2015-05-06 – 2015-05-07 (×2): 100 mg via ORAL
  Filled 2015-05-06 (×3): qty 2

## 2015-05-06 MED ORDER — LORAZEPAM 1 MG PO TABS: 1.0000 mg | ORAL_TABLET | Freq: Four times a day (QID) | ORAL | Status: DC | PRN

## 2015-05-06 MED ORDER — ADULT MULTIVITAMIN W/MINERALS CH
1.0000 | ORAL_TABLET | Freq: Every day | ORAL | Status: DC
  Administered 2015-05-06 – 2015-05-09 (×3): 1 via ORAL
  Filled 2015-05-06 (×4): qty 1

## 2015-05-06 MED ORDER — ATORVASTATIN CALCIUM 40 MG PO TABS
40.0000 mg | ORAL_TABLET | Freq: Every day | ORAL | Status: DC
  Administered 2015-05-06 – 2015-05-09 (×4): 40 mg via ORAL
  Filled 2015-05-06 (×5): qty 1

## 2015-05-06 MED ORDER — ONDANSETRON HCL 4 MG/2ML IJ SOLN
INTRAMUSCULAR | Status: AC
Start: 2015-05-06 — End: 2015-05-06
  Filled 2015-05-06: qty 2

## 2015-05-06 MED ORDER — FUROSEMIDE 10 MG/ML IJ SOLN
40.0000 mg | Freq: Once | INTRAMUSCULAR | Status: AC
  Administered 2015-05-06: 40 mg via INTRAVENOUS
  Filled 2015-05-06: qty 4

## 2015-05-06 MED ORDER — ETOMIDATE 2 MG/ML IV SOLN
INTRAVENOUS | Status: AC
  Filled 2015-05-06: qty 10

## 2015-05-06 MED ORDER — METHOCARBAMOL 1000 MG/10ML IJ SOLN
500.0000 mg | Freq: Four times a day (QID) | INTRAVENOUS | Status: DC | PRN
  Filled 2015-05-06: qty 5

## 2015-05-06 MED ORDER — THIAMINE HCL 100 MG/ML IJ SOLN
100.0000 mg | Freq: Every day | INTRAMUSCULAR | Status: DC
  Filled 2015-05-06 (×4): qty 1

## 2015-05-06 MED ORDER — METHOCARBAMOL 500 MG PO TABS: 500.0000 mg | ORAL_TABLET | Freq: Four times a day (QID) | ORAL | Status: DC | PRN

## 2015-05-06 MED ORDER — PROPOFOL 10 MG/ML IV BOLUS
INTRAVENOUS | Status: AC
  Filled 2015-05-06: qty 20

## 2015-05-06 MED ORDER — HYDRALAZINE HCL 20 MG/ML IJ SOLN
10.0000 mg | INTRAMUSCULAR | Status: DC | PRN
  Administered 2015-05-07: 10 mg via INTRAVENOUS
  Filled 2015-05-06: qty 1

## 2015-05-06 MED ORDER — SENNOSIDES-DOCUSATE SODIUM 8.6-50 MG PO TABS: 1.0000 | ORAL_TABLET | Freq: Every evening | ORAL | Status: DC | PRN

## 2015-05-06 MED ORDER — NICOTINE 21 MG/24HR TD PT24
21.0000 mg | MEDICATED_PATCH | Freq: Every day | TRANSDERMAL | Status: DC
  Administered 2015-05-06 – 2015-05-09 (×4): 21 mg via TRANSDERMAL
  Filled 2015-05-06 (×4): qty 1

## 2015-05-06 MED ORDER — FOLIC ACID 1 MG PO TABS
1.0000 mg | ORAL_TABLET | Freq: Every day | ORAL | Status: DC
  Administered 2015-05-06 – 2015-05-09 (×3): 1 mg via ORAL
  Filled 2015-05-06 (×4): qty 1

## 2015-05-06 MED ORDER — BICALUTAMIDE 50 MG PO TABS
50.0000 mg | ORAL_TABLET | Freq: Every day | ORAL | Status: DC
Start: 2015-05-06 — End: 2015-05-09
  Administered 2015-05-06 – 2015-05-09 (×4): 50 mg via ORAL
  Filled 2015-05-06 (×4): qty 1

## 2015-05-06 MED ORDER — CARVEDILOL 3.125 MG PO TABS
3.1250 mg | ORAL_TABLET | Freq: Two times a day (BID) | ORAL | Status: DC
  Administered 2015-05-06 – 2015-05-09 (×7): 3.125 mg via ORAL
  Filled 2015-05-06 (×7): qty 1

## 2015-05-06 MED ORDER — FENTANYL CITRATE (PF) 250 MCG/5ML IJ SOLN
INTRAMUSCULAR | Status: AC
  Filled 2015-05-06: qty 5

## 2015-05-06 MED ORDER — PANTOPRAZOLE SODIUM 40 MG PO TBEC
40.0000 mg | DELAYED_RELEASE_TABLET | Freq: Every day | ORAL | Status: DC
  Administered 2015-05-06 – 2015-05-09 (×4): 40 mg via ORAL
  Filled 2015-05-06 (×5): qty 1

## 2015-05-06 NOTE — ED Notes (Signed)
Using clean technique, cleansed skin tear of left forearm with wound cleaner. Applied xeroform over the wound. Secured with kerlix and tape. Pt tolerated it well.

## 2015-05-06 NOTE — Consult Note (Signed)
CARDIOLOGY CONSULT NOTE     Patient ID: Matthew Mahoney MRN: [id number] DOB/AGE: October 07, 1936 79 y.o.  Admit date: 05/05/2015 Referring Physician Garwin Brothers MD Primary Physician Leamon Arnt, MD Primary Cardiologist: Liam Rogers MD Reason for Consultation pre op cardiology assessment  HPI: Matthew Mahoney is a 79 yo WM with complex cardiac history. He was admitted with a right femoral neck fracture following a mechanical fall. States he was rounding a corner and tripped on his cane. He lives independently at [employer]. Still drives. Denies any increase in edema and hasn't used lasix in a couple of years. Reports compliance with his medication including Eliquis which he last took yesterday am. No increase in SOB. Has some chronic SOB which he attributes to smoking. He denies any chest pain. Still smokes but is trying to quit with nicotine patch. Admits to drinking 3 oz of alcohol daily.   His cardiac history is significant for history of CAD with remote stents. ( I am unable to locate any records of this ). He has a history of HTN, COPD,  atrial fibrillation, and prior CVA. He is on chronic anticoagulation with Eliquis. He is s/p ORIF of the left hip in February 2014. In October 2014 he had a CVA and was found to be in AFib. He was treated with rate control and anticoagulation. Echo at that time shows EF 20-25%. This has not been repeated. EF was normal by Echo in 2010.    Past Medical History  Diagnosis Date  . Hypertension   . Edema of leg   . COPD (chronic obstructive pulmonary disease)   . Choledocholithiasis   . Coronary arteriosclerosis   . Hyperlipidemia   . Ichthyosis   . Mitral regurgitation   . Onychomycosis   . Vitamin D deficiency   . Aortic stenosis   . Hyperkalemia   . Hypocalcemia   . Lymphadenopathy   . Prostate cancer   . Status post lung surgery 12/07/2001    BENIGHN LESION ON RIGHT LUNG REMOVED  . Ascites     HISTORY  . History of colonic diverticulitis   .  Olecranon bursitis   . GI bleed     a. 03/2013 a/w hemorrhagic shock - EGD on 03/1013, which revealed active bleeding from proximal duodenum, with complete hemostasis following epinephrine injection therapy. At least 2 duodenal ulcers identified.    Family History  Problem Relation Age of Onset  . Hypertension Mother 77  . Hypertension Father 101  . Hypertension Sister   . Hypertension Brother   . Hypertension Sister   . Hypertension Brother   . Hypertension Brother   . Hypertension Brother   . Hypertension Brother     History   Social History  . Marital Status: Widowed    Spouse Name: N/A  . Number of Children: 0  . Years of Education: 12th   Occupational History  . retired    Social History Main Topics  . Smoking status: Current Some Day Smoker -- 1.50 packs/day    Types: Cigarettes  . Smokeless tobacco: Not on file  . Alcohol Use: Yes     Comment: Drinks 2-3 times a week. 2oz of liquor.   . Drug Use: No  . Sexual Activity: Not Currently   Other Topics Concern  . Not on file   Social History Narrative    Past Surgical History  Procedure Laterality Date  . Hemorrhoid surgery  12/07/1998  . Lung surgery  12/07/2001    BENIGN LESION  ON RIGHT LUNG REMOVED  . Colonoscopy    . Femur im nail Left 01/21/2013    Procedure: INTRAMEDULLARY (IM) NAIL FEMORAL;  Surgeon: Hessie Dibble, MD;  Location: [facility];  Service: Orthopedics;  Laterality: Left;  . Esophagogastroduodenoscopy N/A 03/16/2013    Procedure: ESOPHAGOGASTRODUODENOSCOPY (EGD);  Surgeon: Cleotis Nipper, MD;  Location: [facility] ENDOSCOPY;  Service: Endoscopy;  Laterality: N/A;  will probably want to do at bedside in ICU---bed placement pending  . Loop recorder implant N/A 09/22/2013    Procedure: LOOP RECORDER IMPLANT;  Surgeon: Coralyn Mark, MD;  Location: [place] CATH LAB;  Service: Cardiovascular;  Laterality: N/A;     Prescriptions prior to admission  Medication Sig Dispense Refill Last Dose  . albuterol (PROVENTIL  HFA;VENTOLIN HFA) 108 (90 BASE) MCG/ACT inhaler Inhale 1 puff into the lungs every 6 (six) hours as needed for wheezing or shortness of breath.   05/04/2015 at Unknown time  . apixaban (ELIQUIS) 2.5 MG TABS tablet Take 1 tablet (2.5 mg total) by mouth 2 (two) times daily. 60 tablet 0 05/05/2015 at 0900  . atorvastatin (LIPITOR) 40 MG tablet Take 40 mg by mouth daily.     . bicalutamide (CASODEX) 50 MG tablet Take 50 mg by mouth daily.   05/05/2015 at Unknown time  . carvedilol (COREG) 3.125 MG tablet Take 1 tablet (3.125 mg total) by mouth 2 (two) times daily with a meal. 60 tablet 4 05/05/2015 at 0800  . furosemide (LASIX) 20 MG tablet Take 20 mg by mouth daily as needed for fluid.    Past Month at Unknown time  . latanoprost (XALATAN) 0.005 % ophthalmic solution Place 1 drop into both eyes at bedtime.   05/04/2015 at Unknown time  . losartan (COZAAR) 100 MG tablet Take 100 mg by mouth daily.   05/05/2015 at Unknown time  . magnesium gluconate (MAGONATE) 500 MG tablet Take 500 mg by mouth daily.   05/05/2015 at Unknown time  . Multiple Vitamins-Minerals (PRESERVISION AREDS PO) Take 1 tablet by mouth 2 (two) times daily.   05/05/2015 at Unknown time  . omega-3 acid ethyl esters (LOVAZA) 1 G capsule Take 1 g by mouth daily.    05/05/2015 at Unknown time  . pantoprazole (PROTONIX) 40 MG tablet Take 40 mg by mouth daily.   05/05/2015 at Unknown time  . thiamine 100 MG tablet Take 100 mg by mouth daily.   05/05/2015 at Unknown time  . olmesartan (BENICAR) 40 MG tablet Take 40 mg by mouth daily.   Taking      ROS: As noted in HPI. All other systems are reviewed and are negative unless otherwise mentioned.   Physical Exam: Blood pressure 168/68, pulse 55, temperature 98.1 F (36.7 C), temperature source Oral, resp. rate 18, height 6' (1.829 m), weight 89.7 kg (197 lb 12 oz), SpO2 96 %. Current Weight  05/06/15 89.7 kg (197 lb 12 oz)  12/26/13 80.922 kg (178 lb 6.4 oz)  11/24/13 82.101 kg (181 lb)      GENERAL:  Well appearing WM in NAD HEENT:  PERRL, EOMI, sclera are clear. Oropharynx is clear. NECK:  No jugular venous distention, carotid upstroke brisk and symmetric, no bruits, no thyromegaly or adenopathy LUNGS:  Clear to auscultation bilaterally CHEST:  Unremarkable HEART:  RRR,  PMI not displaced or sustained,S1 and S2 within normal limits, no S3, no S4: no clicks, no rubs, no murmurs ABD:  Soft, nontender. BS +, no masses or bruits. No hepatomegaly, no splenomegaly EXT:  2 + pulses throughout, no edema, no cyanosis no clubbing SKIN:  Warm and dry.  No rashes NEURO:  Alert and oriented x 3. Cranial nerves II through XII intact. PSYCH:  Cognitively intact    Labs:   Lab Results  Component Value Date   WBC 11.1* 05/06/2015   HGB 12.9* 05/06/2015   HCT 40.4 05/06/2015   MCV 92.0 05/06/2015   PLT 122* 05/06/2015    Recent Labs Lab 05/06/15 0444  NA 139  K 4.1  CL 106  CO2 22  BUN 39*  CREATININE 1.57*  CALCIUM 9.9  PROT 7.3  BILITOT 1.0  ALKPHOS 85  ALT 17  AST 17  GLUCOSE 115*   Lab Results  Component Value Date   TROPONINI <0.30 09/19/2013   TROPONINI <0.30 03/16/2013   TROPONINI <0.30 03/16/2013   Lab Results  Component Value Date   CHOL 120 09/20/2013   Lab Results  Component Value Date   HDL 39* 09/20/2013   Lab Results  Component Value Date   LDLCALC 67 09/20/2013   Lab Results  Component Value Date   TRIG 68 09/20/2013   Lab Results  Component Value Date   CHOLHDL 3.1 09/20/2013   No results found for: LDLDIRECT  No results found for: PROBNP No results found for: TSH Lab Results  Component Value Date   HGBA1C 5.0 09/20/2013    Radiology: Dg Chest 1 View  05/06/2015   CLINICAL DATA:  Status post fall.  Initial encounter.  EXAM: CHEST  1 VIEW  COMPARISON:  Chest radiograph performed 09/19/2013  FINDINGS: The lungs are well-aerated. Vascular congestion is noted, with mild bilateral atelectasis. There is no evidence of pleural  effusion or pneumothorax.  The cardiomediastinal silhouette is mildly enlarged. No acute osseous abnormalities are seen.  IMPRESSION: Vascular congestion and mild cardiomegaly, with mild bilateral atelectasis. No displaced rib fracture seen.   Electronically Signed   By: Garald Balding M.D.   On: 05/06/2015 01:13   Dg Hip Unilat With Pelvis 2-3 Views Right  05/06/2015   CLINICAL DATA:  Acute onset of right hip pain, status post fall. Initial encounter.  EXAM: RIGHT HIP (WITH PELVIS) 2-3 VIEWS  COMPARISON:  None.  FINDINGS: There is suspicion of a minimally displaced transcervical fracture through the right femoral neck. No additional fractures are seen. The right femoral head remains seated at the acetabulum.  The patient's left femoral hardware is grossly unremarkable in appearance. The left hip joint remains intact. Mild sclerotic change is noted at the sacroiliac joints. Diffuse vascular calcifications are seen. The visualized bowel gas pattern is grossly unremarkable.  IMPRESSION: Suspect minimally displaced transcervical fracture through the right femoral neck.   Electronically Signed   By: Garald Balding M.D.   On: 05/06/2015 01:11    EKG: atrial fibrillation with rate 65. LAFB, nonspecific T wave abnormality. I have personally reviewed and interpreted this study.   ASSESSMENT AND PLAN:  1. Chronic systolic CHF. Patient symptomatically doing well. Mild congestion noted on CXR. BNP elevated but no baseline for comparison. Able to lie flat. No edema on exam. Will give one dose of IV lasix today. Continue Coreg and ARB. Will check Echo today. 2. Chronic atrial fibrillation. Rate is well controlled on Coreg only. Will need to hold Eliquis for surgery. 3. HTN. Continue Coreg and ARB. 4. CAD. ?remote stents. No active angina. 5. Right femoral neck fracture. 6. Tobacco abuse 7. COPD. 8. History of Etoh abuse. 9. History of CVA 10. History  of prostate CA 11. CKD stage 3  Patient is at least  moderate risk for hip surgery. He does appear to be fairly well compensated from a cardiac standpoint and has no active symptoms. Will check Echo today. With eliquis will need to hold off on surgery until tomorrow. Will need to resume anticoagulation post op. Will gently diurese today.   Signed: Geremy Rister [place], [place]  05/06/2015, 9:11 AM

## 2015-05-06 NOTE — ED Notes (Addendum)
Susie from main lab called and stated pink top was to hemolyzed  and a new sample would have to be recollected if patient needed to receive blood. RN made aware.

## 2015-05-06 NOTE — Progress Notes (Signed)
Orthopedic Tech Progress Note Patient Details:  Matthew Mahoney 19-Aug-1936 [id number]  Patient ID: Matthew Mahoney, male   DOB: 1936/01/30, 79 y.o.   MRN: [id number] Trapeze bar  Matthew Mahoney 05/06/2015, 10:24 AM

## 2015-05-06 NOTE — H&P (Signed)
[facility] History and Physical  Patient: Matthew Mahoney  MRN: [id number]  DOB: 1936/11/06  DOS: the patient was seen and examined on 05/06/2015 PCP: Leamon Arnt, MD  Referring physician: Dr. Tomi Bamberger Chief Complaint: Fall  HPI: Matthew Mahoney is a 79 y.o. male with Past medical history of hypertension, COPD, coronary artery disease, combined systolic and diastolic heart failure, CVA, A. fib, on chronic anticoagulation, alcohol use. The patient is presenting with complaints of mechanical fall. He mentions that he was watching TV and then he stood up to go to the restroom while using his cane. His cane stumbled on something and lost his balance and fell on the ground. He denies any head injury or neck injury denies passing out or he denies any loss of control of bowel or bladder. After the fall he has been having significant pain and therefore called EMS. At the time of my evaluation the pain is tolerated and the patient continues to deny any complaints of focal deficit or chest pain or shortness of breath or fever or chills. He mentions he is compliant with all his medications and his sister helps admission medications. He drinks a fifth of bourbon(750 mL) over a week with last drink being yesterday morning. Patient denies any drug abuse. Patient also smokes 2 pack a day. His last dose of apixaban was on the morning of 5/29, he mentions he has missed his 9 PM dose.  The patient is coming from home And at his baseline independent for most of his ADL.  Review of Systems: as mentioned in the history of present illness.  A comprehensive review of the other systems is negative.  Past Medical History  Diagnosis Date  . Hypertension   . Edema of leg   . COPD (chronic obstructive pulmonary disease)   . Choledocholithiasis   . Coronary arteriosclerosis   . Hyperlipidemia   . Ichthyosis   . Mitral regurgitation   . Onychomycosis   . Vitamin D deficiency   . Aortic stenosis   .  Hyperkalemia   . Hypocalcemia   . Lymphadenopathy   . Prostate cancer   . Status post lung surgery 12/07/2001    BENIGHN LESION ON RIGHT LUNG REMOVED  . Ascites     HISTORY  . History of colonic diverticulitis   . Olecranon bursitis   . GI bleed     a. 03/2013 a/w hemorrhagic shock - EGD on 03/1013, which revealed active bleeding from proximal duodenum, with complete hemostasis following epinephrine injection therapy. At least 2 duodenal ulcers identified.   Past Surgical History  Procedure Laterality Date  . Hemorrhoid surgery  12/07/1998  . Lung surgery  12/07/2001    BENIGN LESION ON RIGHT LUNG REMOVED  . Colonoscopy    . Femur im nail Left 01/21/2013    Procedure: INTRAMEDULLARY (IM) NAIL FEMORAL;  Surgeon: Hessie Dibble, MD;  Location: [facility] ORS;  Service: Orthopedics;  Laterality: Left;  . Esophagogastroduodenoscopy N/A 03/16/2013    Procedure: ESOPHAGOGASTRODUODENOSCOPY (EGD);  Surgeon: Cleotis Nipper, MD;  Location: [facility] ENDOSCOPY;  Service: Endoscopy;  Laterality: N/A;  will probably want to do at bedside in ICU---bed placement pending  . Loop recorder implant N/A 09/22/2013    Procedure: LOOP RECORDER IMPLANT;  Surgeon: Coralyn Mark, MD;  Location: [place] CATH LAB;  Service: Cardiovascular;  Laterality: N/A;   Social History:  reports that he has been smoking Cigarettes.  He has been smoking about 1.50 packs per day. He does not have  any smokeless tobacco history on file. He reports that he drinks alcohol. He reports that he does not use illicit drugs.  Allergies  Allergen Reactions  . Plavix [Clopidogrel Bisulfate] Other (See Comments)    Unknown reaction    Family History  Problem Relation Age of Onset  . Hypertension Mother 77  . Hypertension Father 28  . Hypertension Sister   . Hypertension Brother   . Hypertension Sister   . Hypertension Brother   . Hypertension Brother   . Hypertension Brother   . Hypertension Brother     Prior to Admission medications   Medication  Sig Start Date End Date Taking? Authorizing Provider  albuterol (PROVENTIL HFA;VENTOLIN HFA) 108 (90 BASE) MCG/ACT inhaler Inhale 1 puff into the lungs every 6 (six) hours as needed for wheezing or shortness of breath.   Yes Historical Provider, MD  apixaban (ELIQUIS) 2.5 MG TABS tablet Take 1 tablet (2.5 mg total) by mouth 2 (two) times daily. 09/25/13  Yes Velvet Bathe, MD  atorvastatin (LIPITOR) 40 MG tablet Take 40 mg by mouth daily.   Yes Historical Provider, MD  bicalutamide (CASODEX) 50 MG tablet Take 50 mg by mouth daily.   Yes Historical Provider, MD  carvedilol (COREG) 3.125 MG tablet Take 1 tablet (3.125 mg total) by mouth 2 (two) times daily with a meal. 01/05/14  Yes Burtis Junes, NP  furosemide (LASIX) 20 MG tablet Take 20 mg by mouth daily as needed for fluid.    Yes Historical Provider, MD  latanoprost (XALATAN) 0.005 % ophthalmic solution Place 1 drop into both eyes at bedtime.   Yes Historical Provider, MD  losartan (COZAAR) 100 MG tablet Take 100 mg by mouth daily.   Yes Historical Provider, MD  magnesium gluconate (MAGONATE) 500 MG tablet Take 500 mg by mouth daily.   Yes Historical Provider, MD  Multiple Vitamins-Minerals (PRESERVISION AREDS PO) Take 1 tablet by mouth 2 (two) times daily.   Yes Historical Provider, MD  omega-3 acid ethyl esters (LOVAZA) 1 G capsule Take 1 g by mouth daily.    Yes Historical Provider, MD  pantoprazole (PROTONIX) 40 MG tablet Take 40 mg by mouth daily.   Yes Historical Provider, MD  thiamine 100 MG tablet Take 100 mg by mouth daily.   Yes Historical Provider, MD  amLODipine (NORVASC) 10 MG tablet Take 5 mg by mouth daily. 09/25/13   Velvet Bathe, MD  olmesartan (BENICAR) 40 MG tablet Take 40 mg by mouth daily.    Historical Provider, MD    Physical Exam: Filed Vitals:   05/06/15 0130 05/06/15 0200 05/06/15 0243 05/06/15 0339  BP: 183/83 158/105 173/79 168/68  Pulse: 56 63 54 55  Temp:   98.1 F (36.7 C)   TempSrc:   Oral   Resp: 20 26  22 18   Height:    6' (1.829 m)  Weight:    89.7 kg (197 lb 12 oz)  SpO2: 94% 91% 95% 96%    General: Alert, Awake and Oriented to Time, Place and Person. Appear in mild distress Eyes: PERRL ENT: Oral Mucosa clear moist. Neck: no JVD Cardiovascular: S1 and S2 Present, no Murmur, Peripheral Pulses Present Respiratory: Bilateral Air entry equal and Decreased,  Clear to Auscultation, no Crackles, no wheezes Abdomen: Bowel Sound present, Soft and nono tender Skin: n Rash Extremities: ono Pedal edema, no calf tenderness Neurologic: Grossly no focal neuro deficit  Labs on Admission:  CBC:  Recent Labs Lab 05/06/15 0038 05/06/15 0444  WBC  12.2* 11.1*  NEUTROABS 9.5* 8.0*  HGB 12.9* 12.9*  HCT 39.9 40.4  MCV 90.9 92.0  PLT 130* 122*    CMP     Component Value Date/Time   NA 139 05/06/2015 0444   K 4.1 05/06/2015 0444   CL 106 05/06/2015 0444   CO2 22 05/06/2015 0444   GLUCOSE 115* 05/06/2015 0444   BUN 39* 05/06/2015 0444   CREATININE 1.57* 05/06/2015 0444   CALCIUM 9.9 05/06/2015 0444   PROT 7.3 05/06/2015 0444   ALBUMIN 4.1 05/06/2015 0444   AST 17 05/06/2015 0444   ALT 17 05/06/2015 0444   ALKPHOS 85 05/06/2015 0444   BILITOT 1.0 05/06/2015 0444   GFRNONAA 41* 05/06/2015 0444   GFRAA 47* 05/06/2015 0444    No results for input(s): LIPASE, AMYLASE in the last 168 hours.  No results for input(s): CKTOTAL, CKMB, CKMBINDEX, TROPONINI in the last 168 hours. BNP (last 3 results)  Recent Labs  05/06/15 0444  BNP 510.3*    ProBNP (last 3 results) No results for input(s): PROBNP in the last 8760 hours.   Radiological Exams on Admission: Dg Chest 1 View  05/06/2015   CLINICAL DATA:  Status post fall.  Initial encounter.  EXAM: CHEST  1 VIEW  COMPARISON:  Chest radiograph performed 09/19/2013  FINDINGS: The lungs are well-aerated. Vascular congestion is noted, with mild bilateral atelectasis. There is no evidence of pleural effusion or pneumothorax.  The  cardiomediastinal silhouette is mildly enlarged. No acute osseous abnormalities are seen.  IMPRESSION: Vascular congestion and mild cardiomegaly, with mild bilateral atelectasis. No displaced rib fracture seen.   Electronically Signed   By: Garald Balding M.D.   On: 05/06/2015 01:13   Dg Hip Unilat With Pelvis 2-3 Views Right  05/06/2015   CLINICAL DATA:  Acute onset of right hip pain, status post fall. Initial encounter.  EXAM: RIGHT HIP (WITH PELVIS) 2-3 VIEWS  COMPARISON:  None.  FINDINGS: There is suspicion of a minimally displaced transcervical fracture through the right femoral neck. No additional fractures are seen. The right femoral head remains seated at the acetabulum.  The patient's left femoral hardware is grossly unremarkable in appearance. The left hip joint remains intact. Mild sclerotic change is noted at the sacroiliac joints. Diffuse vascular calcifications are seen. The visualized bowel gas pattern is grossly unremarkable.  IMPRESSION: Suspect minimally displaced transcervical fracture through the right femoral neck.   Electronically Signed   By: Garald Balding M.D.   On: 05/06/2015 01:11   EKG: Independently reviewed. atrial fibrillation, rate controlled.  Assessment/Plan Principal Problem:   Closed fracture of femur, neck Active Problems:   Hypertension   Prostate cancer   CVA (cerebral infarction)   CKD (chronic kidney disease), stage III   Atrial fibrillation   CAD (coronary artery disease)   Skin tear of forearm without complication   Chronic combined systolic and diastolic congestive heart failure   1. Closed fracture of femur, neck  The patient is presenting with a mechanical fall. Patient does not appear to have any other injuries other than hip injury. He appears to have fracture neck femur. Orthopedics has been consulted and will be following up with the patient. Patient is on ELIQUIS is therefore he will require observation off of ELIQUIS, prior to  surgery. Orthopedic will follow-up with the patient. Pain management with morphine and Norco as well as when necessary Robaxin.  2. Preoperative evaluation  A) Cardiac risk: Based on RCRI  >History of HF >History of  cerebrovascular disease  With this the patient is a moderate to high risk for adverse Cardiac outcome from surgery. Recommend further work up with echocardiogram and cardiology consultation prior to surgery. Be watchful of hydration since the pt has history of CHF. Monitor Ins and Out. Patient is going to remain off of his anticoagulation. He can be placed on aspirin to mitigate the risk for strokes with his A. fib.  B) Pulmonary risk: The pt has history of COPD Recommend continue use of PRN nebulizer, and optimization of lung function with use of inhalers and incentive spirometry. Good pulmunary toilet.  2. history of A. fib. History of CVA. CHF. The patient is on adequate rest to reduce his stroke risk. For surgery test to be stopped. Patient should be placed on aspirin until he can be placed back on anticoagulation.  3. chronic combined CHF. Check BMP check echocardiogram. Patient is not on scheduled Lasix at home. Monitor ins and outs.  4. chronic kidney disease. This increases the risk for patient's surgical outcome. We will avoid nephrotoxic medications and avoid significant fluctuation in his blood pressure.  5.Alcohol abuse. This also can be a risk for his anesthesia. Closely monitor for withdrawal. Currently based on withdrawal prevention protocol.  6.history of COPD and the active smoker. Nicotine patch.   Advance goals of care discussion: Full code   DVT Prophylaxis: subcutaneous Heparin Nutrition: Regular cardiac diet  Disposition: Admitted as inpatient, MedSurg  unit.  Author: Berle Mull, MD [facility] Pager: [phone] 05/06/2015  If 7PM-7AM, please contact night-coverage [email or web address] Password [email or web address]

## 2015-05-06 NOTE — Progress Notes (Signed)
Pt seen and examined at bedside. Pt admitted after midnight so please see earlier admission not by Dr. Posey Pronto. Pt admitted with right hip fracture. Cardiology consulted for pre op clearance. Eliquis on hold for now until post op. VS stable but HR noted to be in 40's. Place on cardiac monitor. Pt is on Coreg 3.125 mg BID, may need to hold if HR < 40. Also, Cr is trending down, may need to hold Losartan if Cr starts trending up. Repeat BMP and CBC in AM. Appreciate cardiology and ortho team following.   Faye Ramsay, MD  [facility] Pager [phone]  If 7PM-7AM, please contact night-coverage [email or web address] Password [email or web address]

## 2015-05-06 NOTE — Progress Notes (Signed)
  Echocardiogram 2D Echocardiogram has been performed.  Matthew Mahoney 05/06/2015, 11:40 AM

## 2015-05-06 NOTE — Progress Notes (Addendum)
Patient had 13 beats of V-tach. Pt asymptomatic. Denies chest pain/palpitation or SOB.  Hr - 53 BP - 151 / 67 O2 - 96 on RA  Cardiologist Dr. [place] ([facility]) notified.  Orders for BMET and MAG. Encourage SCD's

## 2015-05-06 NOTE — ED Provider Notes (Signed)
CSN: [id number]     Arrival date & time 05/05/15  2232 History   First MD Initiated Contact with Patient 05/05/15 2305     Chief Complaint  Patient presents with  . Fall     (Consider location/radiation/quality/duration/timing/severity/associated sxs/prior Treatment) HPI  Patient states tonight he was walking through his kitchen to go to the bathroom using his cane and is cane got hooked on something and he lost his balance and fell landing onto his right hip. He denies hitting his head or having loss of consciousness. He denies neck pain back pain or pain anywhere except in his right hip. He states he has pain when he tries to flex his knee. He indicates the pains in his right posterior thigh. He does note he has a skin tear in his left wrist without pain to his wrist. He is unsure of his tetanus status. Patient reports he had left hip surgery done by Dr. Rhona Raider about 2 years ago.  PCP Dr [facility] Dr Rhona Raider  Past Medical History  Diagnosis Date  . Hypertension   . Edema of leg   . COPD (chronic obstructive pulmonary disease)   . Choledocholithiasis   . Coronary arteriosclerosis   . Hyperlipidemia   . Ichthyosis   . Mitral regurgitation   . Onychomycosis   . Vitamin D deficiency   . Aortic stenosis   . Hyperkalemia   . Hypocalcemia   . Lymphadenopathy   . Prostate cancer   . Status post lung surgery 12/07/2001    BENIGHN LESION ON RIGHT LUNG REMOVED  . Ascites     HISTORY  . History of colonic diverticulitis   . Olecranon bursitis   . GI bleed     a. 03/2013 a/w hemorrhagic shock - EGD on 03/1013, which revealed active bleeding from proximal duodenum, with complete hemostasis following epinephrine injection therapy. At least 2 duodenal ulcers identified.   Past Surgical History  Procedure Laterality Date  . Hemorrhoid surgery  12/07/1998  . Lung surgery  12/07/2001    BENIGN LESION ON RIGHT LUNG REMOVED  . Colonoscopy    . Femur im nail Left 01/21/2013   Procedure: INTRAMEDULLARY (IM) NAIL FEMORAL;  Surgeon: Hessie Dibble, MD;  Location: [facility];  Service: Orthopedics;  Laterality: Left;  . Esophagogastroduodenoscopy N/A 03/16/2013    Procedure: ESOPHAGOGASTRODUODENOSCOPY (EGD);  Surgeon: Cleotis Nipper, MD;  Location: [facility] ENDOSCOPY;  Service: Endoscopy;  Laterality: N/A;  will probably want to do at bedside in ICU---bed placement pending  . Loop recorder implant N/A 09/22/2013    Procedure: LOOP RECORDER IMPLANT;  Surgeon: Coralyn Mark, MD;  Location: [place] [facility] CATH LAB;  Service: Cardiovascular;  Laterality: N/A;   Family History  Problem Relation Age of Onset  . Hypertension Mother 76  . Hypertension Father 79  . Hypertension Sister   . Hypertension Brother   . Hypertension Sister   . Hypertension Brother   . Hypertension Brother   . Hypertension Brother   . Hypertension Brother    History  Substance Use Topics  . Smoking status: Current Some Day Smoker -- 1.50 packs/day    Types: Cigarettes  . Smokeless tobacco: Not on file  . Alcohol Use: Yes     Comment: Drinks 2-3 times a week. 2oz of liquor.   lives at home Lives alone Uses a cane Smokes 2 ppd Drinks 2 mixed drinks daily  Review of Systems  All other systems reviewed and are negative.     Allergies  Plavix  Home Medications   Prior to Admission medications   Medication Sig Start Date End Date Taking? Authorizing Provider  albuterol (PROVENTIL HFA;VENTOLIN HFA) 108 (90 BASE) MCG/ACT inhaler Inhale 1 puff into the lungs every 6 (six) hours as needed for wheezing or shortness of breath.   Yes Historical Provider, MD  apixaban (ELIQUIS) 2.5 MG TABS tablet Take 1 tablet (2.5 mg total) by mouth 2 (two) times daily. 09/25/13  Yes Velvet Bathe, MD  atorvastatin (LIPITOR) 40 MG tablet Take 40 mg by mouth daily.   Yes Historical Provider, MD  bicalutamide (CASODEX) 50 MG tablet Take 50 mg by mouth daily.   Yes Historical Provider, MD  carvedilol (COREG) 3.125 MG tablet Take  1 tablet (3.125 mg total) by mouth 2 (two) times daily with a meal. 01/05/14  Yes Burtis Junes, NP  furosemide (LASIX) 20 MG tablet Take 20 mg by mouth daily as needed for fluid.    Yes Historical Provider, MD  latanoprost (XALATAN) 0.005 % ophthalmic solution Place 1 drop into both eyes at bedtime.   Yes Historical Provider, MD  losartan (COZAAR) 100 MG tablet Take 100 mg by mouth daily.   Yes Historical Provider, MD  magnesium gluconate (MAGONATE) 500 MG tablet Take 500 mg by mouth daily.   Yes Historical Provider, MD  Multiple Vitamins-Minerals (PRESERVISION AREDS PO) Take 1 tablet by mouth 2 (two) times daily.   Yes Historical Provider, MD  omega-3 acid ethyl esters (LOVAZA) 1 G capsule Take 1 g by mouth daily.    Yes Historical Provider, MD  pantoprazole (PROTONIX) 40 MG tablet Take 40 mg by mouth daily.   Yes Historical Provider, MD  thiamine 100 MG tablet Take 100 mg by mouth daily.   Yes Historical Provider, MD  amLODipine (NORVASC) 10 MG tablet Take 5 mg by mouth daily. 09/25/13   Velvet Bathe, MD  olmesartan (BENICAR) 40 MG tablet Take 40 mg by mouth daily.    Historical Provider, MD   BP 158/105 mmHg  Pulse 63  Temp(Src) 97.5 F (36.4 C) (Oral)  Resp 26  Ht 6' (1.829 m)  Wt 200 lb (90.719 kg)  BMI 27.12 kg/m2  SpO2 91%  Vital signs normal   Physical Exam  Constitutional: He is oriented to person, place, and time. He appears well-developed and well-nourished.  Non-toxic appearance. He does not appear ill. No distress.  HENT:  Head: Normocephalic and atraumatic.  Right Ear: External ear normal.  Left Ear: External ear normal.  Nose: Nose normal. No mucosal edema or rhinorrhea.  Mouth/Throat: Oropharynx is clear and moist and mucous membranes are normal. No dental abscesses or uvula swelling.  Eyes: Conjunctivae and EOM are normal. Pupils are equal, round, and reactive to light.  Neck: Normal range of motion and full passive range of motion without pain. Neck supple.    Cardiovascular: Normal rate, regular rhythm and normal heart sounds.  Exam reveals no gallop and no friction rub.   No murmur heard. Pulmonary/Chest: Effort normal and breath sounds normal. No respiratory distress. He has no wheezes. He has no rhonchi. He has no rales. He exhibits no tenderness and no crepitus.  Abdominal: Soft. Normal appearance and bowel sounds are normal. He exhibits no distension. There is no tenderness. There is no rebound and no guarding.  Musculoskeletal: Normal range of motion. He exhibits no edema or tenderness.       Legs: Moves all extremities well except for his right lower extremity. There is no shortening of  the limb however he does have some mild right external rotation.   Neurological: He is alert and oriented to person, place, and time. He has normal strength. No cranial nerve deficit.  Skin: Skin is warm, dry and intact. No rash noted. No erythema. No pallor.  Pt has a skin tear on his left forearm  Psychiatric: He has a normal mood and affect. His speech is normal and behavior is normal. His mood appears not anxious.  Nursing note and vitals reviewed.   ED Course  Procedures (including critical care time)   Patient refused pain medication during my initial interview. He was advised to let the nurse know if he changes his mind.  When I went back to give patient the results of his x-rays he again refused pain medication. He was advised to let the nurse know if he changes his mind.  01:57 Dr Lynann Bologna, orthopedics, they will see this am and operate today or tomorrow  02:36 Dr Posey Pronto, hospitalist, wants admitted to [facility]  Results for orders placed or performed during the hospital encounter of 05/05/15  Comprehensive metabolic panel  Result Value Ref Range   Sodium 138 135 - 145 mmol/L   Potassium 4.0 3.5 - 5.1 mmol/L   Chloride 108 101 - 111 mmol/L   CO2 19 (L) 22 - 32 mmol/L   Glucose, Bld 117 (H) 65 - 99 mg/dL   BUN 41 (H) 6 - 20  mg/dL   Creatinine, Ser 1.65 (H) 0.61 - 1.24 mg/dL   Calcium 9.7 8.9 - 10.3 mg/dL   Total Protein 7.3 6.5 - 8.1 g/dL   Albumin 4.2 3.5 - 5.0 g/dL   AST 18 15 - 41 U/L   ALT 15 (L) 17 - 63 U/L   Alkaline Phosphatase 88 38 - 126 U/L   Total Bilirubin 0.6 0.3 - 1.2 mg/dL   GFR calc non Af Amer 38 (L) >60 mL/min   GFR calc Af Amer 45 (L) >60 mL/min   Anion gap 11 5 - 15  CBC with Differential  Result Value Ref Range   WBC 12.2 (H) 4.0 - 10.5 K/uL   RBC 4.39 4.22 - 5.81 MIL/uL   Hemoglobin 12.9 (L) 13.0 - 17.0 g/dL   HCT 39.9 39.0 - 52.0 %   MCV 90.9 78.0 - 100.0 fL   MCH 29.4 26.0 - 34.0 pg   MCHC 32.3 30.0 - 36.0 g/dL   RDW 13.0 11.5 - 15.5 %   Platelets 130 (L) 150 - 400 K/uL   Neutrophils Relative % 78 (H) 43 - 77 %   Neutro Abs 9.5 (H) 1.7 - 7.7 K/uL   Lymphocytes Relative 15 12 - 46 %   Lymphs Abs 1.8 0.7 - 4.0 K/uL   Monocytes Relative 5 3 - 12 %   Monocytes Absolute 0.7 0.1 - 1.0 K/uL   Eosinophils Relative 1 0 - 5 %   Eosinophils Absolute 0.2 0.0 - 0.7 K/uL   Basophils Relative 1 0 - 1 %   Basophils Absolute 0.1 0.0 - 0.1 K/uL  Protime-INR  Result Value Ref Range   Prothrombin Time 14.3 11.6 - 15.2 seconds   INR 1.09 0.00 - 1.49  APTT  Result Value Ref Range   aPTT 31 24 - 37 seconds  Urinalysis, Routine w reflex microscopic  Result Value Ref Range   Color, Urine YELLOW YELLOW   APPearance CLEAR CLEAR   Specific Gravity, Urine 1.012 1.005 - 1.030   pH 5.5  5.0 - 8.0   Glucose, UA NEGATIVE NEGATIVE mg/dL   Hgb urine dipstick NEGATIVE NEGATIVE   Bilirubin Urine NEGATIVE NEGATIVE   Ketones, ur NEGATIVE NEGATIVE mg/dL   Protein, ur 100 (A) NEGATIVE mg/dL   Urobilinogen, UA 0.2 0.0 - 1.0 mg/dL   Nitrite NEGATIVE NEGATIVE   Leukocytes, UA NEGATIVE NEGATIVE  Urine microscopic-add on  Result Value Ref Range   Squamous Epithelial / LPF RARE RARE   Laboratory interpretation all normal except for leukocytosis, mild anemia, renal insufficiency       Imaging  Review Dg Chest 1 View  05/06/2015   CLINICAL DATA:  Status post fall.  Initial encounter.  EXAM: CHEST  1 VIEW  COMPARISON:  Chest radiograph performed 09/19/2013  FINDINGS: The lungs are well-aerated. Vascular congestion is noted, with mild bilateral atelectasis. There is no evidence of pleural effusion or pneumothorax.  The cardiomediastinal silhouette is mildly enlarged. No acute osseous abnormalities are seen.  IMPRESSION: Vascular congestion and mild cardiomegaly, with mild bilateral atelectasis. No displaced rib fracture seen.   Electronically Signed   By: Garald Balding M.D.   On: 05/06/2015 01:13   Dg Hip Unilat With Pelvis 2-3 Views Right  05/06/2015   CLINICAL DATA:  Acute onset of right hip pain, status post fall. Initial encounter.  EXAM: RIGHT HIP (WITH PELVIS) 2-3 VIEWS  COMPARISON:  None.  FINDINGS: There is suspicion of a minimally displaced transcervical fracture through the right femoral neck. No additional fractures are seen. The right femoral head remains seated at the acetabulum.  The patient's left femoral hardware is grossly unremarkable in appearance. The left hip joint remains intact. Mild sclerotic change is noted at the sacroiliac joints. Diffuse vascular calcifications are seen. The visualized bowel gas pattern is grossly unremarkable.  IMPRESSION: Suspect minimally displaced transcervical fracture through the right femoral neck.   Electronically Signed   By: Garald Balding M.D.   On: 05/06/2015 01:11     EKG Interpretation   Date/Time:  Monday May 06 2015 01:09:06 EDT Ventricular Rate:  65 PR Interval:    QRS Duration: 119 QT Interval:  476 QTC Calculation: 495 R Axis:   -76 Text Interpretation:  Atrial fibrillation Ventricular premature complex  Left anterior fascicular block Nonspecific T abnormalities, lateral leads  No significant change since last tracing 25 Sep 2013 Confirmed by [facility]   MD-I, Illya Gienger ([id number]) on 05/06/2015 2:25:16 AM      MDM   Final  diagnoses:  Fall at home, initial encounter  Subcapital fracture of hip, right, closed, initial encounter  Skin tear of forearm without complication, left, initial encounter    Plan admission   Rolland Porter, MD, Barbette Or, MD 05/06/15 [phone]

## 2015-05-06 NOTE — Consult Note (Signed)
Reason for Consult:Right Hip Pain Referring Physician: Dr. Doyle Askew PCP: Dr. Billey Chang Cardiologist: Dr. Liam Rogers  Admit date: 05/05/2015  HPI: Matthew Mahoney is a pleasant 79 yo caucasian male with cardiac history admitted last night with a right femoral neck fracture following a fall. He was walking to the bathroom and tripped over his cane landing on his right side. He had immediate 8/10 px in the R hip. He lives independently at [facility] and still drives. His sister helps take care of him. Reports compliance with his medication including Eliquis which he took yesterday am. Has some chronic SOB which he attributes to smoking though he is trying to quit. He denies any chest pain. Admits to drinking 3 oz of alcohol daily.   Significant cardiac hx, cardiology has been consulted and has recommended echo and stabilization for 1 day prior to clearing for sx tomorrow. He is classified as moderate risk. He will stay off Elequis until PO.   Currently he reports minimal pain, well controlled, he has been compliant with NWB and has good appetite. 2/10 ache pain about R hip, denies N/T, no N/V/D, hx of L femoral rod placement per Dr Rhona Raider several years ago that did well. No hx hip fx.     Past Medical History  Diagnosis Date  . Hypertension   . Edema of leg   . COPD (chronic obstructive pulmonary disease)   . Choledocholithiasis   . Coronary arteriosclerosis   . Hyperlipidemia   . Ichthyosis   . Mitral regurgitation   . Onychomycosis   . Vitamin D deficiency   . Aortic stenosis   . Hyperkalemia   . Hypocalcemia   . Lymphadenopathy   . Prostate cancer   . Status post lung surgery 12/07/2001    BENIGHN LESION ON RIGHT LUNG REMOVED  . Ascites     HISTORY  . History of colonic diverticulitis   . Olecranon bursitis   . GI bleed     a. 03/2013 a/w hemorrhagic shock - EGD on 03/1013, which revealed active bleeding from proximal duodenum, with complete hemostasis following epinephrine  injection therapy. At least 2 duodenal ulcers identified.    Past Surgical History  Procedure Laterality Date  . Hemorrhoid surgery  12/07/1998  . Lung surgery  12/07/2001    BENIGN LESION ON RIGHT LUNG REMOVED  . Colonoscopy    . Femur im nail Left 01/21/2013    Procedure: INTRAMEDULLARY (IM) NAIL FEMORAL;  Surgeon: Hessie Dibble, MD;  Location: [facility] ORS;  Service: Orthopedics;  Laterality: Left;  . Esophagogastroduodenoscopy N/A 03/16/2013    Procedure: ESOPHAGOGASTRODUODENOSCOPY (EGD);  Surgeon: Cleotis Nipper, MD;  Location: [facility] ENDOSCOPY;  Service: Endoscopy;  Laterality: N/A;  will probably want to do at bedside in ICU---bed placement pending  . Loop recorder implant N/A 09/22/2013    Procedure: LOOP RECORDER IMPLANT;  Surgeon: Coralyn Mark, MD;  Location: [place] CATH LAB;  Service: Cardiovascular;  Laterality: N/A;    Family History  Problem Relation Age of Onset  . Hypertension Mother 72  . Hypertension Father 41  . Hypertension Sister   . Hypertension Brother   . Hypertension Sister   . Hypertension Brother   . Hypertension Brother   . Hypertension Brother   . Hypertension Brother     Social History:  reports that he has been smoking Cigarettes.  He has been smoking about 1.50 packs per day. He does not have any smokeless tobacco history on file. He reports that he drinks  alcohol. He reports that he does not use illicit drugs. He is attempting to quit and using nicotine patch  Allergies:  Allergies  Allergen Reactions  . Plavix [Clopidogrel Bisulfate] Other (See Comments)    Unknown reaction    Medications:    Medication List    ASK your doctor about these medications        albuterol 108 (90 BASE) MCG/ACT inhaler  Commonly known as:  PROVENTIL HFA;VENTOLIN HFA  Inhale 1 puff into the lungs every 6 (six) hours as needed for wheezing or shortness of breath.     apixaban 2.5 MG Tabs tablet  Commonly known as:  ELIQUIS  Take 1 tablet (2.5 mg total) by mouth 2 (two)  times daily.     atorvastatin 40 MG tablet  Commonly known as:  LIPITOR  Take 40 mg by mouth daily.     bicalutamide 50 MG tablet  Commonly known as:  CASODEX  Take 50 mg by mouth daily.     carvedilol 3.125 MG tablet  Commonly known as:  COREG  Take 1 tablet (3.125 mg total) by mouth 2 (two) times daily with a meal.     furosemide 20 MG tablet  Commonly known as:  LASIX  Take 20 mg by mouth daily as needed for fluid.     latanoprost 0.005 % ophthalmic solution  Commonly known as:  XALATAN  Place 1 drop into both eyes at bedtime.     losartan 100 MG tablet  Commonly known as:  COZAAR  Take 100 mg by mouth daily.     magnesium gluconate 500 MG tablet  Commonly known as:  MAGONATE  Take 500 mg by mouth daily.     olmesartan 40 MG tablet  Commonly known as:  BENICAR  Take 40 mg by mouth daily.     omega-3 acid ethyl esters 1 G capsule  Commonly known as:  LOVAZA  Take 1 g by mouth daily.     pantoprazole 40 MG tablet  Commonly known as:  PROTONIX  Take 40 mg by mouth daily.     PRESERVISION AREDS PO  Take 1 tablet by mouth 2 (two) times daily.     thiamine 100 MG tablet  Take 100 mg by mouth daily.        Results for orders placed or performed during the hospital encounter of 05/05/15 (from the past 48 hour(s))  Comprehensive metabolic panel     Status: Abnormal   Collection Time: 05/06/15 12:38 AM  Result Value Ref Range   Sodium 138 135 - 145 mmol/L   Potassium 4.0 3.5 - 5.1 mmol/L   Chloride 108 101 - 111 mmol/L   CO2 19 (L) 22 - 32 mmol/L   Glucose, Bld 117 (H) 65 - 99 mg/dL   BUN 41 (H) 6 - 20 mg/dL   Creatinine, Ser 1.65 (H) 0.61 - 1.24 mg/dL   Calcium 9.7 8.9 - 10.3 mg/dL   Total Protein 7.3 6.5 - 8.1 g/dL   Albumin 4.2 3.5 - 5.0 g/dL   AST 18 15 - 41 U/L   ALT 15 (L) 17 - 63 U/L   Alkaline Phosphatase 88 38 - 126 U/L   Total Bilirubin 0.6 0.3 - 1.2 mg/dL   GFR calc non Af Amer 38 (L) >60 mL/min   GFR calc Af Amer 45 (L) >60 mL/min     Comment: (NOTE) The eGFR has been calculated using the CKD EPI equation. This calculation has not been validated in  all clinical situations. eGFR's persistently <60 mL/min signify possible Chronic Kidney Disease.    Anion gap 11 5 - 15  CBC with Differential     Status: Abnormal   Collection Time: 05/06/15 12:38 AM  Result Value Ref Range   WBC 12.2 (H) 4.0 - 10.5 K/uL   RBC 4.39 4.22 - 5.81 MIL/uL   Hemoglobin 12.9 (L) 13.0 - 17.0 g/dL   HCT 39.9 39.0 - 52.0 %   MCV 90.9 78.0 - 100.0 fL   MCH 29.4 26.0 - 34.0 pg   MCHC 32.3 30.0 - 36.0 g/dL   RDW 13.0 11.5 - 15.5 %   Platelets 130 (L) 150 - 400 K/uL   Neutrophils Relative % 78 (H) 43 - 77 %   Neutro Abs 9.5 (H) 1.7 - 7.7 K/uL   Lymphocytes Relative 15 12 - 46 %   Lymphs Abs 1.8 0.7 - 4.0 K/uL   Monocytes Relative 5 3 - 12 %   Monocytes Absolute 0.7 0.1 - 1.0 K/uL   Eosinophils Relative 1 0 - 5 %   Eosinophils Absolute 0.2 0.0 - 0.7 K/uL   Basophils Relative 1 0 - 1 %   Basophils Absolute 0.1 0.0 - 0.1 K/uL  Protime-INR     Status: None   Collection Time: 05/06/15 12:38 AM  Result Value Ref Range   Prothrombin Time 14.3 11.6 - 15.2 seconds   INR 1.09 0.00 - 1.49  APTT     Status: None   Collection Time: 05/06/15 12:38 AM  Result Value Ref Range   aPTT 31 24 - 37 seconds  Urinalysis, Routine w reflex microscopic     Status: Abnormal   Collection Time: 05/06/15 12:38 AM  Result Value Ref Range   Color, Urine YELLOW YELLOW   APPearance CLEAR CLEAR   Specific Gravity, Urine 1.012 1.005 - 1.030   pH 5.5 5.0 - 8.0   Glucose, UA NEGATIVE NEGATIVE mg/dL   Hgb urine dipstick NEGATIVE NEGATIVE   Bilirubin Urine NEGATIVE NEGATIVE   Ketones, ur NEGATIVE NEGATIVE mg/dL   Protein, ur 100 (A) NEGATIVE mg/dL   Urobilinogen, UA 0.2 0.0 - 1.0 mg/dL   Nitrite NEGATIVE NEGATIVE   Leukocytes, UA NEGATIVE NEGATIVE  Urine microscopic-add on     Status: None   Collection Time: 05/06/15 12:38 AM  Result Value Ref Range   Squamous  Epithelial / LPF RARE RARE  CBC with Differential/Platelet     Status: Abnormal   Collection Time: 05/06/15  4:44 AM  Result Value Ref Range   WBC 11.1 (H) 4.0 - 10.5 K/uL   RBC 4.39 4.22 - 5.81 MIL/uL   Hemoglobin 12.9 (L) 13.0 - 17.0 g/dL   HCT 40.4 39.0 - 52.0 %   MCV 92.0 78.0 - 100.0 fL   MCH 29.4 26.0 - 34.0 pg   MCHC 31.9 30.0 - 36.0 g/dL   RDW 13.0 11.5 - 15.5 %   Platelets 122 (L) 150 - 400 K/uL   Neutrophils Relative % 71 43 - 77 %   Neutro Abs 8.0 (H) 1.7 - 7.7 K/uL   Lymphocytes Relative 18 12 - 46 %   Lymphs Abs 1.9 0.7 - 4.0 K/uL   Monocytes Relative 9 3 - 12 %   Monocytes Absolute 1.0 0.1 - 1.0 K/uL   Eosinophils Relative 1 0 - 5 %   Eosinophils Absolute 0.1 0.0 - 0.7 K/uL   Basophils Relative 1 0 - 1 %   Basophils Absolute 0.1 0.0 -  0.1 K/uL  Comprehensive metabolic panel     Status: Abnormal   Collection Time: 05/06/15  4:44 AM  Result Value Ref Range   Sodium 139 135 - 145 mmol/L   Potassium 4.1 3.5 - 5.1 mmol/L   Chloride 106 101 - 111 mmol/L   CO2 22 22 - 32 mmol/L   Glucose, Bld 115 (H) 65 - 99 mg/dL   BUN 39 (H) 6 - 20 mg/dL   Creatinine, Ser 1.57 (H) 0.61 - 1.24 mg/dL   Calcium 9.9 8.9 - 10.3 mg/dL   Total Protein 7.3 6.5 - 8.1 g/dL   Albumin 4.1 3.5 - 5.0 g/dL   AST 17 15 - 41 U/L   ALT 17 17 - 63 U/L   Alkaline Phosphatase 85 38 - 126 U/L   Total Bilirubin 1.0 0.3 - 1.2 mg/dL   GFR calc non Af Amer 41 (L) >60 mL/min   GFR calc Af Amer 47 (L) >60 mL/min    Comment: (NOTE) The eGFR has been calculated using the CKD EPI equation. This calculation has not been validated in all clinical situations. eGFR's persistently <60 mL/min signify possible Chronic Kidney Disease.    Anion gap 11 5 - 15  Brain natriuretic peptide     Status: Abnormal   Collection Time: 05/06/15  4:44 AM  Result Value Ref Range   B Natriuretic Peptide 510.3 (H) 0.0 - 100.0 pg/mL  Surgical pcr screen     Status: None   Collection Time: 05/06/15  8:10 AM  Result Value Ref  Range   MRSA, PCR NEGATIVE NEGATIVE   Staphylococcus aureus NEGATIVE NEGATIVE    Comment:        The Xpert SA Assay (FDA approved for NASAL specimens in patients over 6 years of age), is one component of a comprehensive surveillance program.  Test performance has been validated by [facility] for patients greater than or equal to 51 year old. It is not intended to diagnose infection nor to guide or monitor treatment.     Dg Chest 1 View  05/06/2015   CLINICAL DATA:  Status post fall.  Initial encounter.  EXAM: CHEST  1 VIEW  COMPARISON:  Chest radiograph performed 09/19/2013  FINDINGS: The lungs are well-aerated. Vascular congestion is noted, with mild bilateral atelectasis. There is no evidence of pleural effusion or pneumothorax.  The cardiomediastinal silhouette is mildly enlarged. No acute osseous abnormalities are seen.  IMPRESSION: Vascular congestion and mild cardiomegaly, with mild bilateral atelectasis. No displaced rib fracture seen.   Electronically Signed   By: Garald Balding M.D.   On: 05/06/2015 01:13   Dg Hip Unilat With Pelvis 2-3 Views Right  05/06/2015   CLINICAL DATA:  Acute onset of right hip pain, status post fall. Initial encounter.  EXAM: RIGHT HIP (WITH PELVIS) 2-3 VIEWS  COMPARISON:  None.  FINDINGS: There is suspicion of a minimally displaced transcervical fracture through the right femoral neck. No additional fractures are seen. The right femoral head remains seated at the acetabulum.  The patient's left femoral hardware is grossly unremarkable in appearance. The left hip joint remains intact. Mild sclerotic change is noted at the sacroiliac joints. Diffuse vascular calcifications are seen. The visualized bowel gas pattern is grossly unremarkable.  IMPRESSION: Suspect minimally displaced transcervical fracture through the right femoral neck.   Electronically Signed   By: Garald Balding M.D.   On: 05/06/2015 01:11    Review of Systems  Constitutional: Negative.     HENT: Negative  for ear discharge and nosebleeds.   Eyes: Negative for discharge and redness.  Respiratory: Negative for cough, hemoptysis, sputum production, shortness of breath, wheezing and stridor.   Cardiovascular: Negative for chest pain and leg swelling.  Gastrointestinal: Negative for nausea, vomiting, abdominal pain, diarrhea and constipation.  Skin: Negative.   Neurological: Negative for dizziness and loss of consciousness.   Blood pressure 134/84, pulse 42, temperature 98.1 F (36.7 C), temperature source Oral, resp. rate 18, height 6' (1.829 m), weight 89.7 kg (197 lb 12 oz), SpO2 96 %. Physical Exam  Constitutional: He is oriented to person, place, and time. He appears well-developed and well-nourished. No distress.  HENT:  Head: Normocephalic and atraumatic.  Right Ear: External ear normal.  Left Ear: External ear normal.  Mouth/Throat: No oropharyngeal exudate.  Eyes: Conjunctivae and EOM are normal. Right eye exhibits no discharge. Left eye exhibits no discharge.  Neck: Normal range of motion.  Respiratory: Effort normal and breath sounds normal. No stridor. He has no wheezes. He has no rales.  GI: Soft. He exhibits no distension. There is no tenderness. There is no rebound.  Musculoskeletal:       Right hip: He exhibits decreased range of motion and tenderness. He exhibits no swelling, no deformity and no laceration.       Legs: Neurological: He is alert and oriented to person, place, and time. He displays normal reflexes. He exhibits normal muscle tone. Coordination normal.  Skin: Skin is warm and dry. No rash noted. He is not diaphoretic. No erythema. No pallor.  Psychiatric: He has a normal mood and affect. His behavior is normal. Judgment and thought content normal.    Assessment/Plan:  Ortho: ND Right femoral neck fx pending operative fixation tomorrow   -Pt comfortable, px well controlled, continue current pain regimen   -NWB RLE until surgery  -NPO midnight  for pending sx tomorrow afternoon  -Will await cardiac clearance for sx pending echo  -Hold elequis until PO, prefer to hold Loveza as well if cardiology OK   Cardiac: Cardiology following pt for substantial cardiac hx, has consulted and rec echo, OK with holding anticoag till PO, currently approved for sx tomorrow pending echo with moderate risk.   Social: Pt is daily tobacco and alcohol user, counseling provided on benefits of smoking cessation for fx healing   ,  J 05/06/2015, 1:59 PM

## 2015-05-07 ENCOUNTER — Encounter (HOSPITAL_COMMUNITY)
Admission: EM | Disposition: A | Discharge status: Skilled Nursing Facility | Payer: Self-pay | Source: Home / Self Care | Attending: Internal Medicine

## 2015-05-07 ENCOUNTER — Inpatient Hospital Stay (HOSPITAL_COMMUNITY): Payer: Medicare Other

## 2015-05-07 ENCOUNTER — Inpatient Hospital Stay (HOSPITAL_COMMUNITY): Payer: Medicare Other | Admitting: Anesthesiology

## 2015-05-07 HISTORY — PX: HIP PINNING,CANNULATED: SHX1758

## 2015-05-07 LAB — CBC
HEMATOCRIT: 41.5 % (ref 39.0–52.0)
HEMOGLOBIN: 13.7 g/dL (ref 13.0–17.0)
MCH: 29.7 pg (ref 26.0–34.0)
MCHC: 33 g/dL (ref 30.0–36.0)
MCV: 90 fL (ref 78.0–100.0)
Platelets: 126 10*3/uL — ABNORMAL LOW (ref 150–400)
RBC: 4.61 MIL/uL (ref 4.22–5.81)
RDW: 12.9 % (ref 11.5–15.5)
WBC: 11.8 10*3/uL — AB (ref 4.0–10.5)

## 2015-05-07 LAB — TYPE AND SCREEN
ABO/RH(D): O POS
ANTIBODY SCREEN: NEGATIVE

## 2015-05-07 LAB — BASIC METABOLIC PANEL
Anion gap: 13 (ref 5–15)
BUN: 36 mg/dL — ABNORMAL HIGH (ref 6–20)
CHLORIDE: 100 mmol/L — AB (ref 101–111)
CO2: 24 mmol/L (ref 22–32)
CREATININE: 1.55 mg/dL — AB (ref 0.61–1.24)
Calcium: 9.8 mg/dL (ref 8.9–10.3)
GFR calc Af Amer: 48 mL/min — ABNORMAL LOW (ref >60)
GFR calc non Af Amer: 41 mL/min — ABNORMAL LOW (ref >60)
GLUCOSE: 116 mg/dL — AB (ref 65–99)
Potassium: 3.8 mmol/L (ref 3.5–5.1)
Sodium: 137 mmol/L (ref 135–145)

## 2015-05-07 SURGERY — FIXATION, FEMUR, NECK, PERCUTANEOUS, USING SCREW
Anesthesia: General | Laterality: Right

## 2015-05-07 MED ORDER — FENTANYL CITRATE (PF) 100 MCG/2ML IJ SOLN
INTRAMUSCULAR | Status: AC
  Filled 2015-05-07: qty 2

## 2015-05-07 MED ORDER — METOCLOPRAMIDE HCL 10 MG PO TABS: 5.0000 mg | ORAL_TABLET | Freq: Three times a day (TID) | ORAL | Status: DC | PRN

## 2015-05-07 MED ORDER — FUROSEMIDE 10 MG/ML IJ SOLN
40.0000 mg | Freq: Once | INTRAMUSCULAR | Status: AC
  Administered 2015-05-07: 40 mg via INTRAVENOUS
  Filled 2015-05-07: qty 4

## 2015-05-07 MED ORDER — METOCLOPRAMIDE HCL 5 MG/ML IJ SOLN
5.0000 mg | Freq: Three times a day (TID) | INTRAMUSCULAR | Status: DC | PRN
Start: 2015-05-07 — End: 2015-05-09

## 2015-05-07 MED ORDER — DOCUSATE SODIUM 100 MG PO CAPS
100.0000 mg | ORAL_CAPSULE | Freq: Two times a day (BID) | ORAL | Status: DC
  Administered 2015-05-07 – 2015-05-09 (×4): 100 mg via ORAL
  Filled 2015-05-07 (×6): qty 1

## 2015-05-07 MED ORDER — PROPOFOL 10 MG/ML IV BOLUS
INTRAVENOUS | Status: DC | PRN
  Administered 2015-05-07: 120 mg via INTRAVENOUS

## 2015-05-07 MED ORDER — PHENOL 1.4 % MT LIQD: 1.0000 | OROMUCOSAL | Status: DC | PRN

## 2015-05-07 MED ORDER — BISACODYL 5 MG PO TBEC: 5.0000 mg | DELAYED_RELEASE_TABLET | Freq: Every day | ORAL | Status: DC | PRN

## 2015-05-07 MED ORDER — EPHEDRINE SULFATE 50 MG/ML IJ SOLN
INTRAMUSCULAR | Status: AC
  Filled 2015-05-07: qty 1

## 2015-05-07 MED ORDER — OMEGA-3-ACID ETHYL ESTERS 1 G PO CAPS
1.0000 g | ORAL_CAPSULE | Freq: Two times a day (BID) | ORAL | Status: DC
  Administered 2015-05-08 – 2015-05-09 (×3): 1 g via ORAL
  Filled 2015-05-07 (×4): qty 1

## 2015-05-07 MED ORDER — LIDOCAINE HCL (CARDIAC) 20 MG/ML IV SOLN
INTRAVENOUS | Status: DC | PRN
  Administered 2015-05-07: 40 mg via INTRAVENOUS

## 2015-05-07 MED ORDER — ALUM & MAG HYDROXIDE-SIMETH 200-200-20 MG/5ML PO SUSP: 30.0000 mL | ORAL | Status: DC | PRN

## 2015-05-07 MED ORDER — ALBUTEROL SULFATE (2.5 MG/3ML) 0.083% IN NEBU
INHALATION_SOLUTION | RESPIRATORY_TRACT | Status: AC
  Filled 2015-05-07: qty 3

## 2015-05-07 MED ORDER — FENTANYL CITRATE (PF) 250 MCG/5ML IJ SOLN
INTRAMUSCULAR | Status: AC
  Filled 2015-05-07: qty 5

## 2015-05-07 MED ORDER — LEVALBUTEROL HCL 1.25 MG/0.5ML IN NEBU
1.2500 mg | INHALATION_SOLUTION | Freq: Four times a day (QID) | RESPIRATORY_TRACT | Status: DC | PRN
  Filled 2015-05-07: qty 0.5

## 2015-05-07 MED ORDER — CEFAZOLIN SODIUM-DEXTROSE 2-3 GM-% IV SOLR
INTRAVENOUS | Status: AC
  Filled 2015-05-07: qty 50

## 2015-05-07 MED ORDER — BUPIVACAINE-EPINEPHRINE 0.25% -1:200000 IJ SOLN
INTRAMUSCULAR | Status: DC | PRN
  Administered 2015-05-07: 6 mL
  Administered 2015-05-07: 10 mL

## 2015-05-07 MED ORDER — PROPOFOL 10 MG/ML IV BOLUS
INTRAVENOUS | Status: AC
  Filled 2015-05-07: qty 20

## 2015-05-07 MED ORDER — 0.9 % SODIUM CHLORIDE (POUR BTL) OPTIME
TOPICAL | Status: DC | PRN
  Administered 2015-05-07: 1000 mL

## 2015-05-07 MED ORDER — ONDANSETRON HCL 4 MG/2ML IJ SOLN
INTRAMUSCULAR | Status: DC | PRN
  Administered 2015-05-07: 4 mg via INTRAVENOUS

## 2015-05-07 MED ORDER — FENTANYL CITRATE (PF) 100 MCG/2ML IJ SOLN
INTRAMUSCULAR | Status: DC | PRN
  Administered 2015-05-07: 25 ug via INTRAVENOUS
  Administered 2015-05-07: 50 ug via INTRAVENOUS
  Administered 2015-05-07 (×2): 25 ug via INTRAVENOUS

## 2015-05-07 MED ORDER — ONDANSETRON HCL 4 MG/2ML IJ SOLN: 4.0000 mg | Freq: Four times a day (QID) | INTRAMUSCULAR | Status: DC | PRN

## 2015-05-07 MED ORDER — ACETAMINOPHEN 650 MG RE SUPP: 650.0000 mg | Freq: Four times a day (QID) | RECTAL | Status: DC | PRN

## 2015-05-07 MED ORDER — ZOLPIDEM TARTRATE 5 MG PO TABS: 5.0000 mg | ORAL_TABLET | Freq: Every evening | ORAL | Status: DC | PRN

## 2015-05-07 MED ORDER — APIXABAN 2.5 MG PO TABS
2.5000 mg | ORAL_TABLET | Freq: Two times a day (BID) | ORAL | Status: DC
  Administered 2015-05-08 – 2015-05-09 (×3): 2.5 mg via ORAL
  Filled 2015-05-07 (×4): qty 1

## 2015-05-07 MED ORDER — MENTHOL 3 MG MT LOZG: 1.0000 | LOZENGE | OROMUCOSAL | Status: DC | PRN

## 2015-05-07 MED ORDER — HYDRALAZINE HCL 10 MG PO TABS
10.0000 mg | ORAL_TABLET | Freq: Three times a day (TID) | ORAL | Status: DC
  Administered 2015-05-07 – 2015-05-09 (×8): 10 mg via ORAL
  Filled 2015-05-07 (×9): qty 1

## 2015-05-07 MED ORDER — MAGNESIUM OXIDE 400 (241.3 MG) MG PO TABS
400.0000 mg | ORAL_TABLET | Freq: Two times a day (BID) | ORAL | Status: DC
  Administered 2015-05-07: 400 mg via ORAL
  Filled 2015-05-07 (×2): qty 1

## 2015-05-07 MED ORDER — ALBUTEROL SULFATE (2.5 MG/3ML) 0.083% IN NEBU
2.5000 mg | INHALATION_SOLUTION | Freq: Once | RESPIRATORY_TRACT | Status: AC
  Administered 2015-05-07: 2.5 mg via RESPIRATORY_TRACT

## 2015-05-07 MED ORDER — ROCURONIUM BROMIDE 100 MG/10ML IV SOLN
INTRAVENOUS | Status: AC
  Filled 2015-05-07: qty 1

## 2015-05-07 MED ORDER — SODIUM CHLORIDE 0.9 % IV SOLN
INTRAVENOUS | Status: DC
Start: 2015-05-07 — End: 2015-05-09
  Administered 2015-05-07 – 2015-05-09 (×3): via INTRAVENOUS

## 2015-05-07 MED ORDER — CEFAZOLIN SODIUM-DEXTROSE 2-3 GM-% IV SOLR
INTRAVENOUS | Status: DC | PRN
  Administered 2015-05-07: 2 g via INTRAVENOUS

## 2015-05-07 MED ORDER — OMEGA-3-ACID ETHYL ESTERS 1 G PO CAPS: 1.0000 g | ORAL_CAPSULE | Freq: Two times a day (BID) | ORAL | Status: DC

## 2015-05-07 MED ORDER — EPHEDRINE SULFATE 50 MG/ML IJ SOLN
INTRAMUSCULAR | Status: DC | PRN
  Administered 2015-05-07: 10 mg via INTRAVENOUS

## 2015-05-07 MED ORDER — ONDANSETRON HCL 4 MG PO TABS: 4.0000 mg | ORAL_TABLET | Freq: Four times a day (QID) | ORAL | Status: DC | PRN

## 2015-05-07 MED ORDER — FENTANYL CITRATE (PF) 100 MCG/2ML IJ SOLN
25.0000 ug | INTRAMUSCULAR | Status: DC | PRN
  Administered 2015-05-07 (×2): 25 ug via INTRAVENOUS

## 2015-05-07 MED ORDER — CEFAZOLIN SODIUM-DEXTROSE 2-3 GM-% IV SOLR
2.0000 g | Freq: Four times a day (QID) | INTRAVENOUS | Status: AC
  Administered 2015-05-07 – 2015-05-08 (×2): 2 g via INTRAVENOUS
  Filled 2015-05-07 (×2): qty 50

## 2015-05-07 MED ORDER — FLEET ENEMA 7-19 GM/118ML RE ENEM: 1.0000 | ENEMA | Freq: Once | RECTAL | Status: AC | PRN

## 2015-05-07 MED ORDER — LACTATED RINGERS IV SOLN
INTRAVENOUS | Status: DC
  Administered 2015-05-07: 1000 mL via INTRAVENOUS
  Administered 2015-05-07: 17:00:00 via INTRAVENOUS

## 2015-05-07 MED ORDER — ONDANSETRON HCL 4 MG/2ML IJ SOLN
INTRAMUSCULAR | Status: AC
  Filled 2015-05-07: qty 2

## 2015-05-07 MED ORDER — SODIUM CHLORIDE 0.9 % IJ SOLN
INTRAMUSCULAR | Status: AC
  Filled 2015-05-07: qty 10

## 2015-05-07 MED ORDER — BUPIVACAINE-EPINEPHRINE (PF) 0.25% -1:200000 IJ SOLN
INTRAMUSCULAR | Status: AC
  Filled 2015-05-07: qty 30

## 2015-05-07 MED ORDER — LIDOCAINE HCL (CARDIAC) 20 MG/ML IV SOLN
INTRAVENOUS | Status: AC
  Filled 2015-05-07: qty 5

## 2015-05-07 MED ORDER — ACETAMINOPHEN 325 MG PO TABS: 650.0000 mg | ORAL_TABLET | Freq: Four times a day (QID) | ORAL | Status: DC | PRN

## 2015-05-07 MED ORDER — SUCCINYLCHOLINE CHLORIDE 20 MG/ML IJ SOLN
INTRAMUSCULAR | Status: DC | PRN
  Administered 2015-05-07: 100 mg via INTRAVENOUS

## 2015-05-07 SURGICAL SUPPLY — 33 items
BIT DRILL 5 ACE CANN QC (BIT) ×2 IMPLANT
BNDG GAUZE ELAST 4 BULKY (GAUZE/BANDAGES/DRESSINGS) ×2 IMPLANT
DRAPE C-ARMOR (DRAPES) ×2 IMPLANT
DRAPE U-SHAPE 47X51 STRL (DRAPES) ×2 IMPLANT
DRSG MEPILEX BORDER 4X8 (GAUZE/BANDAGES/DRESSINGS) ×2 IMPLANT
GAUZE SPONGE 4X4 12PLY STRL (GAUZE/BANDAGES/DRESSINGS) ×2 IMPLANT
GAUZE XEROFORM 1X8 LF (GAUZE/BANDAGES/DRESSINGS) ×2 IMPLANT
GLOVE BIOGEL PI IND STRL 7.0 (GLOVE) ×1 IMPLANT
GLOVE BIOGEL PI IND STRL 8 (GLOVE) ×1 IMPLANT
GLOVE BIOGEL PI INDICATOR 7.0 (GLOVE) ×1
GLOVE BIOGEL PI INDICATOR 8 (GLOVE) ×1
GLOVE ECLIPSE 7.0 STRL STRAW (GLOVE) ×2 IMPLANT
GLOVE SURG ORTHO 8.0 STRL STRW (GLOVE) ×4 IMPLANT
GOWN L4 XXLG W/PAP TWL (GOWN DISPOSABLE) ×2 IMPLANT
GOWN STRL REUS W/ TWL XL LVL3 (GOWN DISPOSABLE) ×1 IMPLANT
GOWN STRL REUS W/TWL XL LVL3 (GOWN DISPOSABLE) ×1
MARKER SKIN DUAL TIP RULER LAB (MISCELLANEOUS) ×2 IMPLANT
NEEDLE HYPO 22GX1.5 SAFETY (NEEDLE) ×2 IMPLANT
PACK GENERAL/GYN (CUSTOM PROCEDURE TRAY) ×2 IMPLANT
PAD ABD 8X10 STRL (GAUZE/BANDAGES/DRESSINGS) ×2 IMPLANT
PIN CLAMP 10 (PIN) ×2 IMPLANT
PIN THREADED GUIDE ACE (PIN) ×8 IMPLANT
SCREW CANN 105X22X6.5 (Screw) ×1 IMPLANT
SCREW CANN 22X6.5X100 (Screw) ×2 IMPLANT
SCREW CANN 6.5 100MM (Screw) ×2 IMPLANT
SCREW CANN 6.5 105MM (Screw) ×1 IMPLANT
STAPLER VISISTAT (STAPLE) ×2 IMPLANT
STAPLER VISISTAT 35W (STAPLE) ×4 IMPLANT
SUT MNCRL AB 3-0 PS2 18 (SUTURE) ×2 IMPLANT
SUT VIC AB 1 CT1 36 (SUTURE) ×6 IMPLANT
SUT VIC AB 2-0 CT2 27 (SUTURE) ×4 IMPLANT
SYRINGE 10CC LL (SYRINGE) ×2 IMPLANT
TAPE CLOTH SURG 6X10 WHT LF (GAUZE/BANDAGES/DRESSINGS) ×2 IMPLANT

## 2015-05-07 NOTE — Care Management Note (Signed)
Case Management Note  Patient Details  Name: Matthew Mahoney MRN: [id number] Date of Birth: December 27, 1935  Subjective/Objective: 79 y/o m admitted w/r hip fx.From home.                   Action/Plan:ortho following for surgery.Cardio following.   Expected Discharge Date:                  Expected Discharge Plan:  [place]  In-House Referral:     Discharge planning Services  CM Consult  Post Acute Care Choice:    Choice offered to:     DME Arranged:    DME Agency:     HH Arranged:    [place] Agency:     Status of Service:  In process, will continue to follow  Medicare Important Message Given:    Date Medicare IM Given:    Medicare IM give by:    Date Additional Medicare IM Given:    Additional Medicare Important Message give by:     If discussed at [place] of Stay Meetings, dates discussed:    Additional Comments:  Dessa Phi, RN 05/07/2015, 12:47 PM

## 2015-05-07 NOTE — Progress Notes (Signed)
TELEMETRY: Reviewed telemetry pt in Atrial fibrillation with controlled rate. Frequent PVCs with some NSVT- longest 5 beats.: Filed Vitals:   05/07/15 0646 05/07/15 0727 05/07/15 0955 05/07/15 1042  BP: 199/91 193/91 186/114 143/85  Pulse:   88 84  Temp:   98.1 F (36.7 C)   TempSrc:   Oral   Resp:   20   Height:      Weight: 90.4 kg (199 lb 4.7 oz)     SpO2:   96%     Intake/Output Summary (Last 24 hours) at 05/07/15 1325 Last data filed at 05/07/15 1040  Gross per 24 hour  Intake    480 ml  Output   2470 ml  Net  -1990 ml   Filed Weights   05/05/15 2239 05/06/15 0339 05/07/15 0646  Weight: 90.719 kg (200 lb) 89.7 kg (197 lb 12 oz) 90.4 kg (199 lb 4.7 oz)    Subjective  Feels ok. No SOB, chest pain, or palpitations.  Marland Kitchen atorvastatin  40 mg Oral q1800  . bicalutamide  50 mg Oral Daily  . carvedilol  3.125 mg Oral BID WC  . ferrous sulfate  325 mg Oral Q breakfast  . folic acid  1 mg Oral Daily  . hydrALAZINE  10 mg Oral 3 times per day  . losartan  100 mg Oral Daily  . multivitamin with minerals  1 tablet Oral Daily  . nicotine  21 mg Transdermal Daily  . pantoprazole  40 mg Oral Daily  . thiamine  100 mg Oral Daily   Or  . thiamine  100 mg Intravenous Daily      LABS: Basic Metabolic Panel:  Recent Labs  05/06/15 1610 05/07/15 0402  NA 139 137  K 3.6 3.8  CL 101 100*  CO2 25 24  GLUCOSE 149* 116*  BUN 35* 36*  CREATININE 1.58* 1.55*  CALCIUM 9.7 9.8  MG 1.4*  --    Liver Function Tests:  Recent Labs  05/06/15 0038 05/06/15 0444  AST 18 17  ALT 15* 17  ALKPHOS 88 85  BILITOT 0.6 1.0  PROT 7.3 7.3  ALBUMIN 4.2 4.1   No results for input(s): LIPASE, AMYLASE in the last 72 hours. CBC:  Recent Labs  05/06/15 0038 05/06/15 0444 05/07/15 0402  WBC 12.2* 11.1* 11.8*  NEUTROABS 9.5* 8.0*  --   HGB 12.9* 12.9* 13.7  HCT 39.9 40.4 41.5  MCV 90.9 92.0 90.0  PLT 130* 122* 126*   Cardiac Enzymes: No results for input(s): CKTOTAL, CKMB,  CKMBINDEX, TROPONINI in the last 72 hours. BNP: No results for input(s): PROBNP in the last 72 hours. D-Dimer: No results for input(s): DDIMER in the last 72 hours. Hemoglobin A1C: No results for input(s): HGBA1C in the last 72 hours. Fasting Lipid Panel: No results for input(s): CHOL, HDL, LDLCALC, TRIG, CHOLHDL, LDLDIRECT in the last 72 hours. Thyroid Function Tests: No results for input(s): TSH, T4TOTAL, T3FREE, THYROIDAB in the last 72 hours.  Invalid input(s): FREET3   Radiology/Studies:  Dg Chest 1 View  05/06/2015   CLINICAL DATA:  Status post fall.  Initial encounter.  EXAM: CHEST  1 VIEW  COMPARISON:  Chest radiograph performed 09/19/2013  FINDINGS: The lungs are well-aerated. Vascular congestion is noted, with mild bilateral atelectasis. There is no evidence of pleural effusion or pneumothorax.  The cardiomediastinal silhouette is mildly enlarged. No acute osseous abnormalities are seen.  IMPRESSION: Vascular congestion and mild cardiomegaly, with mild bilateral atelectasis. No displaced rib  fracture seen.   Electronically Signed   By: Garald Balding M.D.   On: 05/06/2015 01:13   Dg Hip Unilat With Pelvis 2-3 Views Right  05/06/2015   CLINICAL DATA:  Acute onset of right hip pain, status post fall. Initial encounter.  EXAM: RIGHT HIP (WITH PELVIS) 2-3 VIEWS  COMPARISON:  None.  FINDINGS: There is suspicion of a minimally displaced transcervical fracture through the right femoral neck. No additional fractures are seen. The right femoral head remains seated at the acetabulum.  The patient's left femoral hardware is grossly unremarkable in appearance. The left hip joint remains intact. Mild sclerotic change is noted at the sacroiliac joints. Diffuse vascular calcifications are seen. The visualized bowel gas pattern is grossly unremarkable.  IMPRESSION: Suspect minimally displaced transcervical fracture through the right femoral neck.   Electronically Signed   By: Garald Balding M.D.   On:  05/06/2015 01:11   Echo: 05/06/15:Study Conclusions  - Left ventricle: The cavity size was normal. There was mild concentric hypertrophy. Systolic function was normal. The estimated ejection fraction was in the range of 50% to 55%. Wall motion was normal; there were no regional wall motion abnormalities. The study was not technically sufficient to allow evaluation of LV diastolic dysfunction due to atrial fibrillation. - Aortic valve: Trileaflet; mildly thickened, mildly calcified leaflets. There was no regurgitation. - Aortic root: The aortic root was normal in size. - Mitral valve: There was no regurgitation. - Left atrium: The atrium was moderately dilated. - Right ventricle: Systolic function was normal. - Right atrium: The atrium was mildly dilated. - Tricuspid valve: There was mild regurgitation. - Pulmonic valve: There was no regurgitation. - Pulmonary arteries: Systolic pressure was mildly increased. PA peak pressure: 43 mm Hg (S). - Inferior vena cava: The vessel was normal in size. - Pericardium, extracardiac: There was no pericardial effusion.  PHYSICAL EXAM GENERAL: Well appearing WM in NAD HEENT: PERRL, EOMI, sclera are clear. Oropharynx is clear. NECK: No jugular venous distention, carotid upstroke brisk and symmetric, no bruits, no thyromegaly or adenopathy LUNGS: Clear to auscultation bilaterally CHEST: Unremarkable HEART: RRR, PMI not displaced or sustained,S1 and S2 within normal limits, no S3, no S4: no clicks, no rubs, no murmurs ABD: Soft, nontender. BS +, no masses or bruits. No hepatomegaly, no splenomegaly EXT: 2 + pulses throughout, no edema, no cyanosis no clubbing SKIN: Warm and dry. No rashes NEURO: Alert and oriented x 3. Cranial nerves II through XII intact. PSYCH: Cognitively intact  ASSESSMENT AND PLAN: 1. Chronic systolic CHF. Patient symptomatically doing well. Mild congestion noted on CXR. BNP elevated but no  baseline for comparison. Able to lie flat. No edema on exam. Received one dose of lasix yesterday with good response.  Continue Coreg and ARB. Echo shows improvement in LV function since 2014- now normal. 2. Chronic atrial fibrillation. Rate is well controlled on Coreg only. Will need to hold Eliquis for surgery. 3. HTN. Continue Coreg and ARB. 4. CAD. ?remote stents. No active angina. 5. Right femoral neck fracture. 6. Tobacco abuse 7. COPD. 8. History of Etoh abuse. 9. History of CVA 10. History of prostate CA 11. CKD stage 3 12. NSVT asymptomatic. Continue beta blocker.  Patient is at least moderate risk for hip surgery. He does appear to be  well compensated from a cardiac standpoint and has no active symptoms. Echo shows normal EF. OK to proceed with planned hip surgery today.  Will need to resume anticoagulation post op.    Present  on Admission:  . Skin tear of forearm without complication . Closed fracture of femur, neck . Atrial fibrillation . CAD (coronary artery disease) . CKD (chronic kidney disease), stage III . CVA (cerebral infarction) . Hypertension . Chronic combined systolic and diastolic congestive heart failure . Prostate cancer  Signed, Evita Merida [place], [place] 05/07/2015 1:25 PM

## 2015-05-07 NOTE — Anesthesia Procedure Notes (Signed)
Procedure Name: Intubation Date/Time: 05/07/2015 5:41 PM Performed by: Glory Buff Pre-anesthesia Checklist: Patient identified, Emergency Drugs available, Suction available and Patient being monitored Patient Re-evaluated:Patient Re-evaluated prior to inductionOxygen Delivery Method: Circle System Utilized Preoxygenation: Pre-oxygenation with 100% oxygen Intubation Type: IV induction Ventilation: Mask ventilation without difficulty Laryngoscope Size: Miller and 3 Grade View: Grade I Tube type: Oral Tube size: 8.0 mm Number of attempts: 1 Airway Equipment and Method: Stylet and Oral airway Placement Confirmation: ETT inserted through vocal cords under direct vision,  positive ETCO2 and breath sounds checked- equal and bilateral Secured at: 23 cm Tube secured with: Tape Dental Injury: Teeth and Oropharynx as per pre-operative assessment

## 2015-05-07 NOTE — Anesthesia Postprocedure Evaluation (Signed)
  Anesthesia Post-op Note  Patient: Matthew Mahoney  Procedure(s) Performed: Procedure(s): CANNULATED HIP PINNING (Right)  Patient Location: PACU  Anesthesia Type:General  Level of Consciousness: awake, alert  and oriented  Airway and Oxygen Therapy: Patient Spontanous Breathing and Patient connected to face mask oxygen  Post-op Pain: mild  Post-op Assessment: Post-op Vital signs reviewed, Patient's Cardiovascular Status Stable, Respiratory Function Stable, Patent Airway, No signs of Nausea or vomiting and Pain level controlled  Post-op Vital Signs: Reviewed and stable  Last Vitals:  Filed Vitals:   05/07/15 1945  BP: 179/97  Pulse: 69  Temp:   Resp: 22    Complications: No apparent anesthesia complications

## 2015-05-07 NOTE — Transfer of Care (Signed)
Immediate Anesthesia Transfer of Care Note  Patient: Matthew Mahoney  Procedure(s) Performed: Procedure(s): CANNULATED HIP PINNING (Right)  Patient Location: PACU  Anesthesia Type:General  Level of Consciousness: awake, alert  and oriented  Airway & Oxygen Therapy: Patient Spontanous Breathing and Patient connected to face mask oxygen  Post-op Assessment: Report given to RN and Post -op Vital signs reviewed and stable  Post vital signs: Reviewed and stable  Last Vitals:  Filed Vitals:   05/07/15 1526  BP: 160/81  Pulse: 70  Temp: 36.6 C  Resp: 20    Complications: No apparent anesthesia complications

## 2015-05-07 NOTE — Progress Notes (Signed)
Discussed patient's right hip fracture with patient and his sister. Discussed the need for operative intervention, as well as risks and recovery associated with surgery. Patient has agreed to proceed with percutaneous pinning of right hip fracture. Will proceed as soon as is feasible. Patient medically optimized per medical team.

## 2015-05-07 NOTE — Progress Notes (Signed)
Patient is s/p percutaneous pinning of right femoral neck fracture.   Postop plan:   1. WBAT 2. Patient to resume preop anticoagulation 3. PT in AM on 6/1 4. Patient may be discharged home or to SNF depending on progress with PT 5. F/u in my office on 05/22/15 at 8:15 am

## 2015-05-07 NOTE — Progress Notes (Signed)
Patient was given IV Lasix 40mg  x1 dose this am due to pulmonary congestion.  Patient had been voiding throughout the night in the urinal but at 1000 was complaining of increased pain and the need to void but inability to do so with the exception of 50cc.  Bladder scan revealed patient had greater than 700cc.  MD made aware of findings and new order received to place foley catheter.  Patient stated that he sees Dr. Risa Grill outpatient for his prostate. Prior to insertion of foley, patient was able to void 150cc but still had ~700 in his bladder.  After a 16 French foley was placed, patient immediately voided 675cc of clear, yellow urine.  Will continue to monitor.

## 2015-05-07 NOTE — Anesthesia Preprocedure Evaluation (Addendum)
Anesthesia Evaluation  Patient identified by MRN, date of birth, ID band Patient awake    Reviewed: Allergy & Precautions, NPO status , Patient's Chart, lab work & pertinent test results  History of Anesthesia Complications Negative for: history of anesthetic complications  Airway Mallampati: II  TM Distance: >3 FB Neck ROM: Full    Dental no notable dental hx. (+) Edentulous Upper, Edentulous Lower, Dental Advisory Given   Pulmonary COPDCurrent Smoker,  breath sounds clear to auscultation  Pulmonary exam normal       Cardiovascular hypertension, + CAD and +CHF Normal cardiovascular exam+ dysrhythmias Atrial Fibrillation Rhythm:Irregular Rate:Normal     Neuro/Psych negative neurological ROS  negative psych ROS   GI/Hepatic Neg liver ROS, GERD-  Medicated and Controlled,  Endo/Other  negative endocrine ROS  Renal/GU Renal disease  negative genitourinary   Musculoskeletal negative musculoskeletal ROS (+)   Abdominal   Peds negative pediatric ROS (+)  Hematology negative hematology ROS (+)   Anesthesia Other Findings   Reproductive/Obstetrics negative OB ROS                           Anesthesia Physical Anesthesia Plan  ASA: III  Anesthesia Plan: General   Post-op Pain Management:    Induction: Intravenous  Airway Management Planned: Oral ETT  Additional Equipment:   Intra-op Plan:   Post-operative Plan: Extubation in OR  Informed Consent: I have reviewed the patients History and Physical, chart, labs and discussed the procedure including the risks, benefits and alternatives for the proposed anesthesia with the patient or authorized representative who has indicated his/her understanding and acceptance.   Dental advisory given  Plan Discussed with:   Anesthesia Plan Comments:         Anesthesia Quick Evaluation

## 2015-05-07 NOTE — Progress Notes (Signed)
Patient ID: Matthew Mahoney, male   DOB: 1936-01-10, 79 y.o.   MRN: [id number]  [facility] PROGRESS NOTE  Matthew Mahoney YQM:[id number] DOB: May 22, 1936 DOA: 05/05/2015 PCP: Matthew Arnt, MD   Brief narrative:    79 y.o. male with complex and multiple medical conditions including HTN, HLD, combined systolic and diastolic CHF with 2 D ECHO in 2014 with EF 20 - 25% and grade II diastolic CHF, a-fib on AC with Eliquis, known history of alcohol abuse, COPD and currently smoking 2  ppd, CAD, presented to [facility] ED after an episode of fall at home after trying to get out of the chair using his cane. He fell on the right hip and has sustained right hip fracture. Ortho team consulted and [facility] asked to admit for further evaluation. Since pt took Eliquis the am of admission, surgery postponed until 05/07/2015.   Assessment/Plan:    Principal Problem:   Closed fracture of the right femur, neck - Eliquis has been on hold since admission - pt determined to be at mod risk for surgical intervention and pt made aware - plan to take to OR this afternoon  - appreciate ortho team assistance  Active Problems:   Acute on Chronic combined systolic and diastolic CHF - initial CXR with mild congestion adn pt has received on dose of Lasix 40 mg IV - pt reports feeling better this AM, repeat CXR with improving edema - repeat 2 D ECHO with EF 50 - 55% on this admission - appreciate cardiology team following, will follow up on the recommendations    Hypertension - SBP in 150's this AM, continue Coreg and ARB - added hydralazine 10 mg TID as well as hydralazine as needed for better BP control    Chronic A-fib, CHADS2 = 5 - rate controlled - holding Eliquis as surgery planned - continue coreg - monitor on telemetry    Hypomagnesemia - supplement and repeat Mg in AM   Tobacco use, alcohol use  - counseled on cessation - provide Thiamine and folate    Chronic respiratory failure secondary to COPD in pt who is  actively smoking - no wheezing on exam this AM - continue bronchodilators as needed    Chronic thrombocytopenia - mild, possibly secondary to alcohol induced bone marrow damage - no signs of active bleeding - repeat CBC in AM   CKD (chronic kidney disease), stage III - Cr remaining stable ~ 1.5  DVT prophylaxis - on Eliquis at home but this has been on hold due to planned surgery   Code Status: Full.  Family Communication:  plan of care discussed with the patient Disposition Plan: Barrier to discharge - needs surgery for hip repair, SNF upon discharge   IV access:  Peripheral IV  Procedures and diagnostic studies:    Dg Chest 1 View  05/06/2015   Vascular congestion and mild cardiomegaly, with mild bilateral atelectasis. No displaced rib fracture  Dg Chest 2 View  05/07/2015  Resolving pulmonary interstitial edema. Stable scarring in the right lung.     Dg Hip Unilat Right  05/06/2015   Suspect minimally displaced transcervical fracture through the right femoral neck.     Medical Consultants:  Ortho  Cardiology   Other Consultants:  PT  IAnti-Infectives:   None  Matthew Ramsay, MD  [facility] Pager [phone]  If 7PM-7AM, please contact night-coverage [email or web address] Password [facility] 05/07/2015, 6:41 PM   LOS: 1 day   HPI/Subjective: No events overnight. Right hip pain 5/10 in severity  with movement.   Objective: Filed Vitals:   05/07/15 1042 05/07/15 1324 05/07/15 1400 05/07/15 1526  BP: 143/85 151/92 152/73 160/81  Pulse: 84  67 70  Temp:   97.5 F (36.4 C) 97.9 F (36.6 C)  TempSrc:   Oral Oral  Resp:   16 20  Height:      Weight:      SpO2:   97% 96%    Intake/Output Summary (Last 24 hours) at 05/07/15 1841 Last data filed at 05/07/15 1536  Gross per 24 hour  Intake      0 ml  Output   2220 ml  Net  -2220 ml    Exam:   General:  Pt is alert, follows commands appropriately, not in acute distress  Cardiovascular: Irregular rate and rhythm, no rubs,  no gallops  Respiratory: Clear to auscultation bilaterally, no wheezing, minimal bibasilar crackles   Abdomen: Soft, non tender, non distended, bowel sounds present, no guarding  Extremities: pulses DP and PT palpable bilaterally  Neuro: Grossly nonfocal  Data Reviewed: Basic Metabolic Panel:  Recent Labs Lab 05/06/15 0038 05/06/15 0444 05/06/15 1610 05/07/15 0402  NA 138 139 139 137  K 4.0 4.1 3.6 3.8  CL 108 106 101 100*  CO2 19* 22 25 24   GLUCOSE 117* 115* 149* 116*  BUN 41* 39* 35* 36*  CREATININE 1.65* 1.57* 1.58* 1.55*  CALCIUM 9.7 9.9 9.7 9.8  MG  --   --  1.4*  --    Liver Function Tests:  Recent Labs Lab 05/06/15 0038 05/06/15 0444  AST 18 17  ALT 15* 17  ALKPHOS 88 85  BILITOT 0.6 1.0  PROT 7.3 7.3  ALBUMIN 4.2 4.1   CBC:  Recent Labs Lab 05/06/15 0038 05/06/15 0444 05/07/15 0402  WBC 12.2* 11.1* 11.8*  NEUTROABS 9.5* 8.0*  --   HGB 12.9* 12.9* 13.7  HCT 39.9 40.4 41.5  MCV 90.9 92.0 90.0  PLT 130* 122* 126*     Recent Results (from the past 240 hour(s))  Surgical pcr screen     Status: None   Collection Time: 05/06/15  8:10 AM  Result Value Ref Range Status   MRSA, PCR NEGATIVE NEGATIVE Final   Staphylococcus aureus NEGATIVE NEGATIVE Final    Comment:        The Xpert SA Assay (FDA approved for NASAL specimens in patients over 28 years of age), is one component of a comprehensive surveillance program.  Test performance has been validated by [facility] for patients greater than or equal to 7 year old. It is not intended to diagnose infection nor to guide or monitor treatment.      Scheduled Meds: . [START ON 05/08/2015] apixaban  2.5 mg Oral BID  . atorvastatin  40 mg Oral q1800  . bicalutamide  50 mg Oral Daily  . carvedilol  3.125 mg Oral BID WC  . ferrous sulfate  325 mg Oral Q breakfast  . folic acid  1 mg Oral Daily  . hydrALAZINE  10 mg Oral 3 times per day  . losartan  100 mg Oral Daily  . multivitamin with  minerals  1 tablet Oral Daily  . nicotine  21 mg Transdermal Daily  . [START ON 05/08/2015] omega-3 acid ethyl esters  1 g Oral BID  . pantoprazole  40 mg Oral Daily  . thiamine  100 mg Oral Daily   Or  . thiamine  100 mg Intravenous Daily   Continuous Infusions: . lactated  ringers 1,000 mL (05/07/15 1624)

## 2015-05-08 ENCOUNTER — Encounter (HOSPITAL_COMMUNITY): Payer: Self-pay | Admitting: Orthopedic Surgery

## 2015-05-08 DIAGNOSIS — I639 Cerebral infarction, unspecified: Secondary | ICD-10-CM

## 2015-05-08 LAB — CBC
HEMATOCRIT: 41.6 % (ref 39.0–52.0)
HEMOGLOBIN: 13.3 g/dL (ref 13.0–17.0)
MCH: 29.7 pg (ref 26.0–34.0)
MCHC: 32 g/dL (ref 30.0–36.0)
MCV: 92.9 fL (ref 78.0–100.0)
Platelets: 123 10*3/uL — ABNORMAL LOW (ref 150–400)
RBC: 4.48 MIL/uL (ref 4.22–5.81)
RDW: 13.1 % (ref 11.5–15.5)
WBC: 12.2 10*3/uL — ABNORMAL HIGH (ref 4.0–10.5)

## 2015-05-08 LAB — BASIC METABOLIC PANEL
Anion gap: 14 (ref 5–15)
BUN: 42 mg/dL — ABNORMAL HIGH (ref 6–20)
CALCIUM: 9.1 mg/dL (ref 8.9–10.3)
CO2: 26 mmol/L (ref 22–32)
Chloride: 99 mmol/L — ABNORMAL LOW (ref 101–111)
Creatinine, Ser: 2.17 mg/dL — ABNORMAL HIGH (ref 0.61–1.24)
GFR calc Af Amer: 32 mL/min — ABNORMAL LOW (ref >60)
GFR calc non Af Amer: 28 mL/min — ABNORMAL LOW (ref >60)
GLUCOSE: 125 mg/dL — AB (ref 65–99)
POTASSIUM: 3.7 mmol/L (ref 3.5–5.1)
SODIUM: 139 mmol/L (ref 135–145)

## 2015-05-08 LAB — MAGNESIUM: Magnesium: 1.4 mg/dL — ABNORMAL LOW (ref 1.7–2.4)

## 2015-05-08 MED ORDER — POLYETHYLENE GLYCOL 3350 17 G PO PACK
17.0000 g | PACK | Freq: Every day | ORAL | Status: DC
  Administered 2015-05-08 – 2015-05-09 (×2): 17 g via ORAL
  Filled 2015-05-08 (×3): qty 1

## 2015-05-08 MED ORDER — MAGNESIUM SULFATE 2 GM/50ML IV SOLN
2.0000 g | Freq: Once | INTRAVENOUS | Status: AC
  Administered 2015-05-08: 2 g via INTRAVENOUS
  Filled 2015-05-08: qty 50

## 2015-05-08 NOTE — Evaluation (Signed)
Occupational Therapy Evaluation Patient Details Name: Matthew Mahoney MRN: [id number] DOB: March 16, 1936 Today's Date: 05/08/2015    History of Present Illness 79 yo male s/p R hip pinning 5/31.    Clinical Impression   Pt was admitted for the above surgery, secondary to fall.  At baseline, he is independent with adls.  Pt currently needs A x 2 to stand and needs mod to max A for transfers and mod to total A x 2 for LB adls. Goals in acute are set at min A level.  Pt will benefit from continued OT in acute followed by SNF    Follow Up Recommendations  SNF    Equipment Recommendations  3 in 1 bedside comode    Recommendations for Other Services       Precautions / Restrictions Precautions Precautions: Fall Restrictions Weight Bearing Restrictions: No RLE Weight Bearing: Weight bearing as tolerated      Mobility Bed Mobility Overal bed mobility: Needs Assistance Bed Mobility: Supine to Sit     Supine to sit: Mod assist;+2 for physical assistance;+2 for safety/equipment;HOB elevated     General bed mobility comments: Assist for trunk and bil LEs. Increased time. Utilized bed sheet to aid with positioning, scooting.   Transfers Overall transfer level: Needs assistance Equipment used: Rolling walker (2 wheeled) Transfers: Stand Pivot Transfers;Sit to/from Stand Sit to Stand: Max assist;+2 physical assistance;+2 safety/equipment Stand pivot transfers: Mod assist;+2 physical assistance;+2 safety/equipment       General transfer comment: x3. Max assist to rise, even from elevated surface. Mod assist +2 for stand pivot, bed>recliner, bsc<>recliner with RW. Increased time.     Balance                                            ADL Overall ADL's : Needs assistance/impaired             Lower Body Bathing: Moderate assistance;+2 for physical assistance;Sit to/from stand       Lower Body Dressing: Maximal assistance;+2 for physical assistance;Sit  to/from stand   Toilet Transfer: Moderate assistance;+2 for safety/equipment;Stand-pivot   Toileting- Clothing Manipulation and Hygiene: Total assistance;+2 for physical assistance;Sit to/from stand         General ADL Comments: pt is able to perform UB adls with set up.  Needed mod to max A x 2 for sit to stand depending upon surface height.  Pt is familiar with reacher:  need A x 2 for LB adls for the sit to stand componenent     Vision     Perception     Praxis      Pertinent Vitals/Pain Pain Assessment: Faces Pain Score: 6  Faces Pain Scale: Hurts even more Pain Location: RLE with weight bearing Pain Intervention(s): Premedicated before session;Repositioned;Ice applied     Hand Dominance     Extremity/Trunk Assessment Upper Extremity Assessment Upper Extremity Assessment: Overall WFL for tasks assessed      Cervical / Trunk Assessment Cervical / Trunk Assessment: Normal   Communication Communication Communication: No difficulties   Cognition Arousal/Alertness: Awake/alert Behavior During Therapy: WFL for tasks assessed/performed Overall Cognitive Status: Within Functional Limits for tasks assessed                     General Comments       Exercises       Shoulder Instructions  Home Living Family/patient expects to be discharged to:: Private residence Living Arrangements: Alone Available Help at Discharge: Family Type of Home: Apartment Home Access: Stairs to enter [employer] of Steps: 1 curb step   Home Layout: One level     Bathroom Shower/Tub: Tub/shower unit Shower/tub characteristics: Architectural technologist: [place] - single point;Walker - 2 wheels          Prior Functioning/Environment Level of Independence: Needs assistance  Gait / Transfers Assistance Needed: using cane inside home ADL's / Homemaking Assistance Needed: sister does chores; pt peforms bathing/dressing without  assistnce   Comments: eats take out mostly    OT Diagnosis: Generalized weakness   OT Problem List: Decreased strength;Decreased activity tolerance;Impaired balance (sitting and/or standing);Decreased knowledge of use of DME or AE;Pain   OT Treatment/Interventions: Self-care/ADL training;DME and/or AE instruction;Patient/family education;Balance training    OT Goals(Current goals can be found in the care plan section) Acute Rehab OT Goals Patient Stated Goal: to be able to walk OT Goal Formulation: With patient Time For Goal Achievement: 05/15/15 Potential to Achieve Goals: Good ADL Goals Pt Will Perform Lower Body Bathing: with min assist;with adaptive equipment;sit to/from stand Pt Will Perform Lower Body Dressing: with mod assist;sit to/from stand;with adaptive equipment Pt Will Transfer to Toilet: with min assist;bedside commode;stand pivot transfer Pt Will Perform Toileting - Clothing Manipulation and hygiene: with min assist;sit to/from stand  OT Frequency: Min 2X/week   Barriers to D/C:            Co-evaluation PT/OT/SLP Co-Evaluation/Treatment: Yes Reason for Co-Treatment: For patient/therapist safety PT goals addressed during session: Mobility/safety with mobility OT goals addressed during session: ADL's and self-care      End of Session Nurse Communication: Mobility status;Need for lift equipment (lift pad placed)  Activity Tolerance: Patient tolerated treatment well Patient left: in chair;with call bell/phone within reach   Time: 1135-1212 OT Time Calculation (min): 37 min Charges:  OT General Charges $OT Visit: 1 Procedure OT Evaluation $Initial OT Evaluation Tier I: 1 Procedure G-Codes:    Verdis Bassette 06-06-2015, 12:47 PM Lesle Chris, OTR/L [phone] 06/06/15

## 2015-05-08 NOTE — Clinical Social Work Note (Signed)
Clinical Social Work Assessment  Patient Details  Name: Matthew Mahoney MRN: [id number] Date of Birth: February 22, 1936  Date of referral:  05/08/15               Reason for consult:  Facility Placement                Permission sought to share information with:  Facility Art therapist granted to share information::  Yes, Verbal Permission Granted  Name::        Agency::     Relationship::     Contact Information:     Housing/Transportation Living arrangements for the past 2 months:  [place] of Information:  Patient Patient Interpreter Needed:  None Criminal Activity/Legal Involvement Pertinent to Current Situation/Hospitalization:  No - Comment as needed Significant Relationships:  Siblings Lives with:  Self Do you feel safe going back to the place where you live?  No Need for family participation in patient care:  No (Coment)  Care giving concerns:  CSW received consult for SNF placement, reviewed PT evaluation recommending SNF at discharge as well.    Social Worker assessment / plan:  CSW spoke with patient to confirm plan to go to SNF at discharge.   Employment status:  Retired Nurse, adult PT Recommendations:  [place] / Referral to community resources:  [place]  Patient/Family's Response to care:  Patient is agreeable with plan for SNF, informed CSW that he had been to [facility] in the past but would like to look at other facilities. CSW provided SNF bed offers - patient accepted bed at [facility].   Patient/Family's Understanding of and Emotional Response to Diagnosis, Current Treatment, and Prognosis:  Patient states that he is glad surgery is over and is looking forward to rehab so he can go home.   Emotional Assessment Appearance:  Appears stated age Attitude/Demeanor/Rapport:    Affect (typically observed):  Pleasant,  Flat Orientation:  Oriented to Self, Oriented to Place, Oriented to  Time, Oriented to Situation Alcohol / Substance use:    Psych involvement (Current and /or in the community):  No (Comment)  Discharge Needs  Concerns to be addressed:    Readmission within the last 30 days:  No Current discharge risk:    Barriers to Discharge:  Continued Medical Work up   Standley Brooking, LCSW 05/08/2015, 2:26 PM

## 2015-05-08 NOTE — Clinical Social Work Placement (Signed)
   CLINICAL SOCIAL WORK PLACEMENT  NOTE  Date:  05/08/2015  Patient Details  Name: Matthew Mahoney MRN: [id number] Date of Birth: Apr 25, 1936  Clinical Social Work is seeking post-discharge placement for this patient at the [place] level of care (*CSW will initial, date and re-position this form in  chart as items are completed):  Yes   Patient/family provided with [place] Work Department's list of facilities offering this level of care within the geographic area requested by the patient (or if unable, by the patient's family).  Yes   Patient/family informed of their freedom to choose among providers that offer the needed level of care, that participate in Medicare, Medicaid or managed care program needed by the patient, have an available bed and are willing to accept the patient.  Yes   Patient/family informed of [place]'s ownership interest in [facility] and [facility], as well as of the fact that they are under no obligation to receive care at these facilities.  PASRR submitted to EDS on 05/08/15     PASRR number received on 05/08/15     Existing PASRR number confirmed on       FL2 transmitted to all facilities in geographic area requested by pt/family on 05/08/15     FL2 transmitted to all facilities within larger geographic area on       Patient informed that his/her managed care company has contracts with or will negotiate with certain facilities, including the following:        Yes   Patient/family informed of bed offers received.  Patient chooses bed at [facility]     Physician recommends and patient chooses bed at      Patient to be transferred to [facility] on  .  Patient to be transferred to facility by       Patient family notified on   of transfer.  Name of family member notified:        PHYSICIAN       Additional Comment:     _______________________________________________ Standley Brooking, LCSW 05/08/2015, 2:28 PM

## 2015-05-08 NOTE — Evaluation (Signed)
Physical Therapy Evaluation Patient Details Name: Matthew Mahoney MRN: [id number] DOB: Jan 13, 1936 Today's Date: 05/08/2015   History of Present Illness  79 yo male s/p R hip pinning 5/31.   Clinical Impression  On eval, pt required Max assist +2 for mobility-able to perform stand pivot with RW with increased time and effort. Mobility limited by pain and weakness. Recommend ST rehab at SNF.     Follow Up Recommendations SNF    Equipment Recommendations  None recommended by PT    Recommendations for Other Services OT consult     Precautions / Restrictions Precautions Precautions: Fall Restrictions Weight Bearing Restrictions: No RLE Weight Bearing: Weight bearing as tolerated      Mobility  Bed Mobility Overal bed mobility: Needs Assistance Bed Mobility: Supine to Sit     Supine to sit: Mod assist;+2 for physical assistance;+2 for safety/equipment;HOB elevated     General bed mobility comments: Assist for trunk and bil LEs. Increased time. Utilized bed sheet to aid with positioning, scooting.   Transfers Overall transfer level: Needs assistance Equipment used: Rolling walker (2 wheeled) Transfers: Sit to/from [facility] Sit to Stand: Max assist;+2 physical assistance;+2 safety/equipment Stand pivot transfers: Mod assist;+2 physical assistance;+2 safety/equipment       General transfer comment: x3. Max assist to rise, even from elevated surface. Mod assist +2 for stand pivot, bed>recliner, bsc<>recliner with RW. Increased time.   Ambulation/Gait             General Gait Details: NT-pt unable  Stairs            Wheelchair Mobility    Modified Rankin (Stroke Patients Only)       Balance                                             Pertinent Vitals/Pain Pain Assessment: Faces Faces Pain Scale: Hurts even more Pain Location: R LE with activity Pain Intervention(s): Premedicated before  session;Repositioned;Monitored during session;Limited activity within patient's tolerance    Home Living Family/patient expects to be discharged to:: Private residence     Type of Home: Apartment Home Access: Stairs to enter   [employer] of Steps: 1 curb step Home Layout: One level Home Equipment: Cane - single point;Walker - 2 wheels      Prior Function Level of Independence: Needs assistance   Gait / Transfers Assistance Needed: using cane inside home  ADL's / Homemaking Assistance Needed: sister does chores; pt peforms bathing/dressing without assistnce  Comments: eats take out mostly     Hand Dominance        Extremity/Trunk Assessment   Upper Extremity Assessment: Defer to OT evaluation           Lower Extremity Assessment: Generalized weakness;RLE deficits/detail RLE Deficits / Details: moves ankle well. hip flex 2/5. R knee buckling with WBing    Cervical / Trunk Assessment: Normal  Communication   Communication: No difficulties  Cognition Arousal/Alertness: Awake/alert Behavior During Therapy: WFL for tasks assessed/performed Overall Cognitive Status: Within Functional Limits for tasks assessed                      General Comments      Exercises        Assessment/Plan    PT Assessment Patient needs continued PT services  PT Diagnosis Difficulty walking;Abnormality of gait;Generalized weakness;Acute pain  PT Problem List Decreased strength;Decreased range of motion;Decreased activity tolerance;Decreased balance;Decreased mobility;Decreased knowledge of use of DME;Decreased knowledge of precautions;Pain  PT Treatment Interventions DME instruction;Gait training;Functional mobility training;Therapeutic activities;Therapeutic exercise;Patient/family education;Balance training   PT Goals (Current goals can be found in the Care Plan section) Acute Rehab PT Goals Patient Stated Goal: to be able to walk PT Goal Formulation: With  patient Time For Goal Achievement: 05/22/15 Potential to Achieve Goals: Good    Frequency Min 3X/week   Barriers to discharge        Co-evaluation               End of Session Equipment Utilized During Treatment: Gait belt Activity Tolerance: Patient limited by fatigue;Patient limited by pain Patient left: in chair;with call bell/phone within reach           Time: 1132-1212 PT Time Calculation (min) (ACUTE ONLY): 40 min   Charges:   PT Evaluation $Initial PT Evaluation Tier I: 1 Procedure PT Treatments $Therapeutic Activity: 8-22 mins   PT G Codes:        Weston Anna, MPT Pager: [phone]

## 2015-05-08 NOTE — Progress Notes (Signed)
Progress Note   Matthew Mahoney YOV:[id number] DOB: [birth year] DOA: 05/05/2015 PCP: Matthew Arnt, MD   Brief Narrative:   Matthew Mahoney is an 79 y.o. male with complex and multiple medical conditions including HTN, HLD, combined systolic and diastolic CHF with 2 D ECHO in 2014 with EF 20 - 25% and grade II diastolic CHF, a-fib on AC with Eliquis, known history of alcohol abuse, COPD with ongoing tobacco abuse of the 2 pack per day habit, CAD, who was admitted 05/05/15 with a fall resulting in a right hip fracture. Ortho team consulted and TRH asked to admit for further evaluation. Since pt took Eliquis the am of admission, surgery postponed until 05/07/2015.   Assessment/Plan:   Principal Problem:  Closed fracture of the right femur, neck - Eliquis has been on hold since admission. - The patient was determined to be at mod risk for surgical intervention and pt made aware. - Underwent percutaneous pinning of right femoral neck fracture 05/07/15. - Physical therapy with weightbearing as tolerated. - Follow-up with orthopedic surgeon 05/22/15 at 8:15 AM.  Active Problems:  Acute on Chronic combined systolic and diastolic CHF - Initial CXR with mild congestion treated with 1 dose of Lasix 40 mg IV. - Repeat CXR with improving edema. - 2 D ECHO done 05/06/15, EF 50 - 55%. - Cardiology following.   Hypertension - Continue Coreg, and hydralazine. Cozaar on hold secondary to bump in creatinine.   Chronic A-fib, CHADS2 = 5 - The patient is rate controlled. - Resume Eliquis.   Hypomagnesemia - We'll give additional magnesium today.   Tobacco use, alcohol use  - The patient was counseled on cessation. - Continue to supplement thiamine and folic acid given alcohol use.   Chronic respiratory failure secondary to COPD in pt who is actively smoking - Continue broncho-dilators as needed.   Chronic thrombocytopenia - Mild. May be related to toxic effects of alcohol on bone  marrow.   CKD (chronic kidney disease), stage III - Slight postoperative bump in creatinine noted. - Cozaar on hold.    DVT prophylaxis  - Eliquis resumed.   Code Status: Full.  Family Communication:No family at bedside.  Matthew Mahoney (sister) is closest family member.  Called & left message [phone]) at 1:15 pm.  Spoke with her at 3:40 pm when she came to visit patient. Disposition Plan: SNF in 24-48 hours if he remains stable.    IV Access:    Peripheral IV   Procedures and diagnostic studies:   Dg Chest 1 View  05/06/2015   CLINICAL DATA:  Status post fall.  Initial encounter.  EXAM: CHEST  1 VIEW  COMPARISON:  Chest radiograph performed 09/19/2013  FINDINGS: The lungs are well-aerated. Vascular congestion is noted, with mild bilateral atelectasis. There is no evidence of pleural effusion or pneumothorax.  The cardiomediastinal silhouette is mildly enlarged. No acute osseous abnormalities are seen.  IMPRESSION: Vascular congestion and mild cardiomegaly, with mild bilateral atelectasis. No displaced rib fracture seen.   Electronically Signed   By: Garald Balding M.D.   On: 05/06/2015 01:13   Dg Chest 2 View  05/07/2015   CLINICAL DATA:  Dyspnea, history of coronary artery disease, CHF, unspecified lung surgery  EXAM: CHEST  2 VIEW  COMPARISON:  Chest x-ray of May 06, 2015  FINDINGS: The lungs are adequately inflated. The pulmonary interstitium has improved with less vascular congestion noted. There is stable scarring in the right mid and lower lung. The cardiac  silhouette is normal in size. There is tortuosity of the descending thoracic aorta. There is no pleural effusion. The bony thorax exhibits no acute abnormality.  IMPRESSION: Resolving pulmonary interstitial edema. Stable scarring in the right lung.   Electronically Signed   By: David  Martinique M.D.   On: 05/07/2015 13:56   Dg Hip Operative Unilat With Pelvis Right  05/07/2015   CLINICAL DATA:  Right hip fracture fixation.   EXAM: OPERATIVE RIGHT HIP (WITH PELVIS IF PERFORMED) 3 VIEWS  TECHNIQUE: Fluoroscopic spot image(s) were submitted for interpretation post-operatively.  FLUOROSCOPY TIME:  Fluoroscopy Time:  2 minutes 15 seconds  Number of Acquired Images:  3  COMPARISON:  05/06/2015  FINDINGS: Three intraoperative views of the right hip are submitted postoperatively for interpretation.  Three pins traverse the femoral neck fracture, in near-anatomic alignment and position. No complicating features are identified.  IMPRESSION: Internal fixation of femoral neck fracture.   Electronically Signed   By: Margarette Canada M.D.   On: 05/07/2015 19:43   Dg Hip Unilat With Pelvis 2-3 Views Right  05/06/2015   CLINICAL DATA:  Acute onset of right hip pain, status post fall. Initial encounter.  EXAM: RIGHT HIP (WITH PELVIS) 2-3 VIEWS  COMPARISON:  None.  FINDINGS: There is suspicion of a minimally displaced transcervical fracture through the right femoral neck. No additional fractures are seen. The right femoral head remains seated at the acetabulum.  The patient's left femoral hardware is grossly unremarkable in appearance. The left hip joint remains intact. Mild sclerotic change is noted at the sacroiliac joints. Diffuse vascular calcifications are seen. The visualized bowel gas pattern is grossly unremarkable.  IMPRESSION: Suspect minimally displaced transcervical fracture through the right femoral neck.   Electronically Signed   By: Garald Balding M.D.   On: 05/06/2015 01:11     Medical Consultants:    Orthopedic surgery  Cardiology  Anti-Infectives:    None.  Subjective:   Matthew Mahoney denies dyspnea, chest pain.  Some postoperative soreness.  No nausea.  No BM in 1 week, but says he hasn't "eaten much".  Appetite remains poor.  Objective:    Filed Vitals:   05/07/15 2045 05/08/15 0200 05/08/15 0616 05/08/15 0800  BP: 154/94 124/66 173/83   Pulse: 65 63 65   Temp: 97.6 F (36.4 C) 97.7 F (36.5 C) 98 F (36.7  C)   TempSrc:  Oral Oral   Resp: 16 20 20    Height:      Weight:      SpO2: 99% 96% 96% 95%    Intake/Output Summary (Last 24 hours) at 05/08/15 0907 Last data filed at 05/08/15 0746  Gross per 24 hour  Intake 2227.5 ml  Output   1800 ml  Net  427.5 ml    Exam: Gen:  NAD Cardiovascular:  HSIR, No M/R/G Respiratory:  Lungs CTAB Gastrointestinal:  Abdomen soft, NT/ND, + BS Extremities:  No C/E/C, SCDs on   Data Reviewed:    Labs: Basic Metabolic Panel:  Recent Labs Lab 05/06/15 0038 05/06/15 0444 05/06/15 1610 05/07/15 0402 05/08/15 0413  NA 138 139 139 137 139  K 4.0 4.1 3.6 3.8 3.7  CL 108 106 101 100* 99*  CO2 19* 22 25 24 26   GLUCOSE 117* 115* 149* 116* 125*  BUN 41* 39* 35* 36* 42*  CREATININE 1.65* 1.57* 1.58* 1.55* 2.17*  CALCIUM 9.7 9.9 9.7 9.8 9.1  MG  --   --  1.4*  --  1.4*  GFR Estimated Creatinine Clearance: 31.3 mL/min (by C-G formula based on Cr of 2.17). Liver Function Tests:  Recent Labs Lab 05/06/15 0038 05/06/15 0444  AST 18 17  ALT 15* 17  ALKPHOS 88 85  BILITOT 0.6 1.0  PROT 7.3 7.3  ALBUMIN 4.2 4.1   Coagulation profile  Recent Labs Lab 05/06/15 0038  INR 1.09    CBC:  Recent Labs Lab 05/06/15 0038 05/06/15 0444 05/07/15 0402 05/08/15 0413  WBC 12.2* 11.1* 11.8* 12.2*  NEUTROABS 9.5* 8.0*  --   --   HGB 12.9* 12.9* 13.7 13.3  HCT 39.9 40.4 41.5 41.6  MCV 90.9 92.0 90.0 92.9  PLT 130* 122* 126* 123*    Microbiology Recent Results (from the past 240 hour(s))  Surgical pcr screen     Status: None   Collection Time: 05/06/15  8:10 AM  Result Value Ref Range Status   MRSA, PCR NEGATIVE NEGATIVE Final   Staphylococcus aureus NEGATIVE NEGATIVE Final    Comment:        The Xpert SA Assay (FDA approved for NASAL specimens in patients over 66 years of age), is one component of a comprehensive surveillance program.  Test performance has been validated by [facility] for patients greater than or equal to 53  year old. It is not intended to diagnose infection nor to guide or monitor treatment.      Medications:   . apixaban  2.5 mg Oral BID  . atorvastatin  40 mg Oral q1800  . bicalutamide  50 mg Oral Daily  . carvedilol  3.125 mg Oral BID WC  . docusate sodium  100 mg Oral BID  . ferrous sulfate  325 mg Oral Q breakfast  . folic acid  1 mg Oral Daily  . hydrALAZINE  10 mg Oral 3 times per day  . magnesium oxide  400 mg Oral BID  . multivitamin with minerals  1 tablet Oral Daily  . nicotine  21 mg Transdermal Daily  . omega-3 acid ethyl esters  1 g Oral BID  . pantoprazole  40 mg Oral Daily  . thiamine  100 mg Oral Daily   Or  . thiamine  100 mg Intravenous Daily   Continuous Infusions: . sodium chloride 75 mL/hr at 05/08/15 0800    Time spent: 35 minutes with > 50% of time discussing current diagnostic test results, clinical impression and plan of care.   LOS: 2 days   Detron Carras  [facility] Pager [phone]. If unable to reach me by pager, please call my cell phone at [phone].  *Please refer to [email or web address], password [email or web address] to get updated schedule on who will round on this patient, as hospitalists switch teams weekly. If 7PM-7AM, please contact night-coverage at [email or web address], password [email or web address] for any overnight needs.  05/08/2015, 9:07 AM

## 2015-05-08 NOTE — Progress Notes (Signed)
Patient Profile: 79 yo WM with complex cardiac history including CAD, atrial fibrillation and combined systolic + diastolic HF. He was admitted with a right femoral neck fracture following a mechanical fall.  Subjective: No major complaints. Resting well. Denies CP and dyspnea.   Objective: Vital signs in last 24 hours: Temp:  [97.5 F (36.4 C)-100.3 F (37.9 C)] 98 F (36.7 C) (06/01 0616) Pulse Rate:  [62-88] 65 (06/01 0616) Resp:  [14-22] 20 (06/01 0616) BP: (124-186)/(66-114) 173/83 mmHg (06/01 0616) SpO2:  [94 %-99 %] 96 % (06/01 0616) Last BM Date: 05/05/15  Intake/Output from previous day: 05/31 0701 - 06/01 0700 In: 1977.5 [P.O.:360; I.V.:1417.5; IV Piggyback:100] Out: 1920 [Urine:1895; Blood:25] Intake/Output this shift:    Medications Current Facility-Administered Medications  Medication Dose Route Frequency Provider Last Rate Last Dose  . 0.9 %  sodium chloride infusion   Intravenous Continuous Rhetta Mura Schorr, NP 75 mL/hr at 05/07/15 2126    . acetaminophen (TYLENOL) tablet 650 mg  650 mg Oral Q6H PRN Phylliss Bob, MD       Or  . acetaminophen (TYLENOL) suppository 650 mg  650 mg Rectal Q6H PRN Phylliss Bob, MD      . alum & mag hydroxide-simeth (MAALOX/MYLANTA) 200-200-20 MG/5ML suspension 30 mL  30 mL Oral Q4H PRN Phylliss Bob, MD      . apixaban Arne Cleveland) tablet 2.5 mg  2.5 mg Oral BID Phylliss Bob, MD      . atorvastatin (LIPITOR) tablet 40 mg  40 mg Oral q1800 Lavina Hamman, MD   40 mg at 05/07/15 2143  . bicalutamide (CASODEX) tablet 50 mg  50 mg Oral Daily Lavina Hamman, MD   50 mg at 05/07/15 3007  . bisacodyl (DULCOLAX) EC tablet 5 mg  5 mg Oral Daily PRN Phylliss Bob, MD      . carvedilol (COREG) tablet 3.125 mg  3.125 mg Oral BID WC Lavina Hamman, MD   3.125 mg at 05/07/15 1537  . docusate sodium (COLACE) capsule 100 mg  100 mg Oral BID Phylliss Bob, MD   100 mg at 05/07/15 2126  . fentaNYL (SUBLIMAZE) 100 MCG/2ML injection           .  fentaNYL (SUBLIMAZE) injection 25-50 mcg  25-50 mcg Intravenous Q5 min PRN Josephine Igo, MD   25 mcg at 05/07/15 2006  . ferrous sulfate tablet 325 mg  325 mg Oral Q breakfast Lavina Hamman, MD   325 mg at 05/06/15 0753  . folic acid (FOLVITE) tablet 1 mg  1 mg Oral Daily Lavina Hamman, MD   1 mg at 05/06/15 0936  . hydrALAZINE (APRESOLINE) injection 10 mg  10 mg Intravenous Q4H PRN Lavina Hamman, MD   10 mg at 05/07/15 0651  . hydrALAZINE (APRESOLINE) tablet 10 mg  10 mg Oral 3 times per day Theodis Blaze, MD   10 mg at 05/08/15 0607  . HYDROcodone-acetaminophen (NORCO/VICODIN) 5-325 MG per tablet 1-2 tablet  1-2 tablet Oral Q6H PRN Lavina Hamman, MD   2 tablet at 05/07/15 2340  . levalbuterol (XOPENEX) nebulizer solution 1.25 mg  1.25 mg Nebulization Q6H PRN Theodis Blaze, MD      . LORazepam (ATIVAN) tablet 1 mg  1 mg Oral Q6H PRN Lavina Hamman, MD       Or  . LORazepam (ATIVAN) injection 1 mg  1 mg Intravenous Q6H PRN Lavina Hamman, MD      .  losartan (COZAAR) tablet 100 mg  100 mg Oral Daily Azaylea Maves M Martinique, MD   100 mg at 05/07/15 0813  . magnesium oxide (MAG-OX) tablet 400 mg  400 mg Oral BID Theodis Blaze, MD   400 mg at 05/07/15 2126  . menthol-cetylpyridinium (CEPACOL) lozenge 3 mg  1 lozenge Oral PRN Phylliss Bob, MD       Or  . phenol (CHLORASEPTIC) mouth spray 1 spray  1 spray Mouth/Throat PRN Phylliss Bob, MD      . methocarbamol (ROBAXIN) tablet 500 mg  500 mg Oral Q6H PRN Lavina Hamman, MD       Or  . methocarbamol (ROBAXIN) 500 mg in dextrose 5 % 50 mL IVPB  500 mg Intravenous Q6H PRN Lavina Hamman, MD      . metoCLOPramide (REGLAN) tablet 5-10 mg  5-10 mg Oral Q8H PRN Phylliss Bob, MD       Or  . metoCLOPramide (REGLAN) injection 5-10 mg  5-10 mg Intravenous Q8H PRN Phylliss Bob, MD      . morphine 2 MG/ML injection 1-2 mg  1-2 mg Intravenous Q4H PRN Lavina Hamman, MD      . multivitamin with minerals tablet 1 tablet  1 tablet Oral Daily Lavina Hamman, MD   1  tablet at 05/06/15 0936  . nicotine (NICODERM CQ - dosed in mg/24 hours) patch 21 mg  21 mg Transdermal Daily Lavina Hamman, MD   21 mg at 05/07/15 2876  . omega-3 acid ethyl esters (LOVAZA) capsule 1 g  1 g Oral BID Phylliss Bob, MD      . ondansetron [facility]) tablet 4 mg  4 mg Oral Q6H PRN Phylliss Bob, MD       Or  . ondansetron (ZOFRAN) injection 4 mg  4 mg Intravenous Q6H PRN Phylliss Bob, MD      . pantoprazole (PROTONIX) EC tablet 40 mg  40 mg Oral Daily Lavina Hamman, MD   40 mg at 05/07/15 0813  . senna-docusate (Senokot-S) tablet 1 tablet  1 tablet Oral QHS PRN Lavina Hamman, MD      . thiamine (VITAMIN B-1) tablet 100 mg  100 mg Oral Daily Lavina Hamman, MD   100 mg at 05/06/15 8115   Or  . thiamine (B-1) injection 100 mg  100 mg Intravenous Daily Lavina Hamman, MD      . zolpidem (AMBIEN) tablet 5 mg  5 mg Oral QHS PRN Phylliss Bob, MD        PE: General appearance: alert, cooperative and no distress Neck: no carotid bruit and no JVD Lungs: clear to auscultation bilaterally Heart: irregularly irregular rhythm Extremities: no LEE + SCDs Pulses: 2+ and symmetric Skin: warm and dry Neurologic: Grossly normal  Lab Results:   Recent Labs  05/06/15 0444 05/07/15 0402 05/08/15 0413  WBC 11.1* 11.8* 12.2*  HGB 12.9* 13.7 13.3  HCT 40.4 41.5 41.6  PLT 122* 126* 123*   BMET  Recent Labs  05/06/15 1610 05/07/15 0402 05/08/15 0413  NA 139 137 139  K 3.6 3.8 3.7  CL 101 100* 99*  CO2 25 24 26   GLUCOSE 149* 116* 125*  BUN 35* 36* 42*  CREATININE 1.58* 1.55* 2.17*  CALCIUM 9.7 9.8 9.1   PT/INR  Recent Labs  05/06/15 0038  LABPROT 14.3  INR 1.09     Assessment/Plan  Principal Problem:   Closed fracture of femur, neck Active Problems:   Hypertension  Prostate cancer   CVA (cerebral infarction)   CKD (chronic kidney disease), stage III   Atrial fibrillation   CAD (coronary artery disease)   Skin tear of forearm without complication    Chronic combined systolic and diastolic congestive heart failure  1. Right Femoral Neck Fracture: Day 1 s/p surgical repair. Management per ortho.  2. CAD: stable w/o angina. Continue BB and statin.  3. Chronic Combined Systolic + Diastolic CHF:  EF 06-26% by Echo. Stable w/o dyspnea. Euvolemic on exam. Continue BB. Will hold ARB for now given AKI. He has been receiving continuous IVFs. Will need to monitor volume status closely to prevent acute exacerbation.   4. Atrial Fibrillation: Rate is well controlled in the 60s-70s. Continue BB. Eliquis has been re-ordered by primary team.   5. HTN: moderately elevated. Post surgical pain may be contributing. Continue Coreg and hydralazine. Continue to monitor. Can continue to use IV hydralazine PRN if severe hypertension.   6. HLD: continue statin therapy.   7. Acute on Chronic Kidney Disease: SCr 2.17 today. Baseline ~1.5. Will hold ARB. Receiving  hydration Avoid PRN NSAIDs. F/u BMP in the AM.    LOS: 2 days    Brittainy M. Ladoris Gene 05/08/2015 7:44 AM  Patient seen and examined and history reviewed. Agree with above findings and plan. Patient is doing very well post op hip surgery without acute cardiac complications. Eliquis resumed. Agree with holding ARB until renal function returns to baseline. Will sign off now. Please call if needed.  Spencer Cardinal Martinique, [place] 05/08/2015 1:13 PM

## 2015-05-08 NOTE — Op Note (Signed)
NAMEWILLAM, Matthew Mahoney NO.:  [id number]  MEDICAL RECORD NO.:  [id number]  LOCATION:  3546                         FACILITY:  [facility]  PHYSICIAN:  Phylliss Bob, MD      DATE OF BIRTH:  02-Aug-1936  DATE OF PROCEDURE:  05/07/2015                              OPERATIVE REPORT   PREOPERATIVE DIAGNOSIS:  Right femoral neck fracture, nondisplaced.  POSTOPERATIVE DIAGNOSIS:  Right femoral neck fracture, nondisplaced.  PROCEDURE:  Percutaneous pinning of right femoral neck fracture using cannulated 6.5-mm screws.  SURGEON:  Phylliss Bob, MD  ASSISTANT:  Pricilla Holm, PA-C  ANESTHESIA:  General endotracheal anesthesia.  COMPLICATIONS:  None.  DISPOSITION:  Stable.  ESTIMATED BLOOD LOSS:  Minimal.  INDICATIONS FOR SURGERY:  Briefly, Mr. Blando is a pleasant 79 year old male who did sustain a fall.  Radiographs did reveal a nondisplaced right femoral neck fracture.  The patient did have pain in the right hip.  We did discuss proceeding with percutaneous pinning of his fracture.  He did elect to proceed after a full discussion of the risks and benefits of surgery.  OPERATIVE DETAILS:  On May 07, 2015, the patient was brought to surgery and general endotracheal anesthesia was administered.  The patient was placed supine on a radiolucent fracture table.  The patient's right lower extremity was positioned appropriately.  All bony prominences were padded.  The left lower extremity was abducted and the hip was flexed. The region of the right hip was prepped and draped in the usual fashion. An incision was then made over the lateral aspect of the hip just below the greater trochanter.  An initial guidewire was placed inferior and medial.  Two guidewires were then placed superior to the initial guidewire.  One was posterior and one was anterior.  I did feel that the position of the guidewires were excellent on both AP and lateral fluoroscopy.  I then tapped the  lateral cortex.  A 6.5-mm screws were placed over the guidewires.  The superior screws were 100 mm in length, while the inferior screw was 105 mm in length.  Excellent purchase was noted in the superior 2 screws.  The lower screw was noted to have suboptimal purchase, which was felt to be secondary to the patient's osteoporosis.  The guidewires were then removed.  The cortex of the femoral head was never violated with the use of the guidewires or the screws.  The wound was then copiously irrigated.  The fascia was closed using #1 Vicryl.  The subcutaneous layer was closed using 2-0 Vicryl, and the skin was closed using staples.  A bacitracin and a sterile dressing was applied.  The patient was then awakened from general endotracheal anesthesia and transferred to recovery in stable condition.  The postoperative plan will be that the patient is able to weight bear as tolerated.  He will resume his anticoagulation.  He will follow up in my office on May 22, 2015, at 8:15 p.m.     Phylliss Bob, MD     MD/MEDQ  D:  05/07/2015  T:  05/08/2015  Job:  [id number]

## 2015-05-08 NOTE — Progress Notes (Signed)
    Patient doing well, PO day 1 S/P R femoral neck fx percutaneous pinning, reports moderate px, well controlled, no N/V, tolerating PO intake well, denies px or numbness below hip, slept well. Pt has not yet been up with PT/OT  Physical Exam: BP 173/83 mmHg  Pulse 65  Temp(Src) 98 F (36.7 C) (Oral)  Resp 20  Ht 6' (1.829 m)  Wt 90.4 kg (199 lb 4.7 oz)  BMI 27.02 kg/m2  SpO2 95%  Dressing in place, has not soaked through, no surrounding echymosis, ice bag in place over bandage, 2+ DPP, Full ROM and 5/5 strength ankle/foot. Distal compartments soft.  NVI  POD #1 s/p R femoral neck fx percutaneous pinning  - Pt doing well, moderate px well controlled - up with PT/OT, encourage ambulation, WBAT RLE - Percocet for pain, Robaxin for muscle spasms - Pt has resumed elequis and lovaza for DVT prophylaxis and heart history  - likely d/c home to assisted living facility tomorrow vs Friday pending PT/OT progress and px control  - Has help of sister at facility  - F/U [facility] 2 weeks (05/22/15 8:30am) - Printed scripts and appt card placed in pts chart

## 2015-05-09 ENCOUNTER — Encounter (HOSPITAL_COMMUNITY): Payer: Self-pay | Admitting: Internal Medicine

## 2015-05-09 DIAGNOSIS — R278 Other lack of coordination: Secondary | ICD-10-CM | POA: Diagnosis not present

## 2015-05-09 DIAGNOSIS — I5042 Chronic combined systolic (congestive) and diastolic (congestive) heart failure: Secondary | ICD-10-CM | POA: Diagnosis not present

## 2015-05-09 DIAGNOSIS — Z9889 Other specified postprocedural states: Secondary | ICD-10-CM | POA: Diagnosis not present

## 2015-05-09 DIAGNOSIS — I6789 Other cerebrovascular disease: Secondary | ICD-10-CM | POA: Diagnosis not present

## 2015-05-09 DIAGNOSIS — S72001A Fracture of unspecified part of neck of right femur, initial encounter for closed fracture: Secondary | ICD-10-CM | POA: Diagnosis not present

## 2015-05-09 DIAGNOSIS — C61 Malignant neoplasm of prostate: Secondary | ICD-10-CM | POA: Diagnosis not present

## 2015-05-09 DIAGNOSIS — I5022 Chronic systolic (congestive) heart failure: Secondary | ICD-10-CM | POA: Diagnosis not present

## 2015-05-09 DIAGNOSIS — S72009A Fracture of unspecified part of neck of unspecified femur, initial encounter for closed fracture: Secondary | ICD-10-CM | POA: Diagnosis not present

## 2015-05-09 DIAGNOSIS — I482 Chronic atrial fibrillation: Secondary | ICD-10-CM | POA: Diagnosis not present

## 2015-05-09 DIAGNOSIS — N183 Chronic kidney disease, stage 3 (moderate): Secondary | ICD-10-CM | POA: Diagnosis not present

## 2015-05-09 DIAGNOSIS — M21251 Flexion deformity, right hip: Secondary | ICD-10-CM | POA: Diagnosis not present

## 2015-05-09 DIAGNOSIS — Z720 Tobacco use: Secondary | ICD-10-CM | POA: Diagnosis not present

## 2015-05-09 DIAGNOSIS — I2510 Atherosclerotic heart disease of native coronary artery without angina pectoris: Secondary | ICD-10-CM | POA: Diagnosis not present

## 2015-05-09 DIAGNOSIS — F10920 Alcohol use, unspecified with intoxication, uncomplicated: Secondary | ICD-10-CM | POA: Diagnosis not present

## 2015-05-09 DIAGNOSIS — I4729 Other ventricular tachycardia: Secondary | ICD-10-CM

## 2015-05-09 DIAGNOSIS — J441 Chronic obstructive pulmonary disease with (acute) exacerbation: Secondary | ICD-10-CM | POA: Diagnosis not present

## 2015-05-09 DIAGNOSIS — R1311 Dysphagia, oral phase: Secondary | ICD-10-CM | POA: Diagnosis not present

## 2015-05-09 DIAGNOSIS — S7291XD Unspecified fracture of right femur, subsequent encounter for closed fracture with routine healing: Secondary | ICD-10-CM | POA: Diagnosis not present

## 2015-05-09 DIAGNOSIS — Z9181 History of falling: Secondary | ICD-10-CM | POA: Diagnosis not present

## 2015-05-09 DIAGNOSIS — I472 Ventricular tachycardia: Secondary | ICD-10-CM

## 2015-05-09 DIAGNOSIS — J9610 Chronic respiratory failure, unspecified whether with hypoxia or hypercapnia: Secondary | ICD-10-CM | POA: Diagnosis not present

## 2015-05-09 DIAGNOSIS — M6281 Muscle weakness (generalized): Secondary | ICD-10-CM | POA: Diagnosis not present

## 2015-05-09 HISTORY — DX: Other ventricular tachycardia: I47.29

## 2015-05-09 HISTORY — DX: Ventricular tachycardia: I47.2

## 2015-05-09 LAB — BASIC METABOLIC PANEL
Anion gap: 11 (ref 5–15)
BUN: 48 mg/dL — ABNORMAL HIGH (ref 6–20)
CO2: 22 mmol/L (ref 22–32)
CREATININE: 1.66 mg/dL — AB (ref 0.61–1.24)
Calcium: 8.6 mg/dL — ABNORMAL LOW (ref 8.9–10.3)
Chloride: 102 mmol/L (ref 101–111)
GFR calc Af Amer: 44 mL/min — ABNORMAL LOW (ref >60)
GFR calc non Af Amer: 38 mL/min — ABNORMAL LOW (ref >60)
Glucose, Bld: 126 mg/dL — ABNORMAL HIGH (ref 65–99)
POTASSIUM: 4 mmol/L (ref 3.5–5.1)
Sodium: 135 mmol/L (ref 135–145)

## 2015-05-09 LAB — CBC
HCT: 39 % (ref 39.0–52.0)
Hemoglobin: 12.4 g/dL — ABNORMAL LOW (ref 13.0–17.0)
MCH: 29.5 pg (ref 26.0–34.0)
MCHC: 31.8 g/dL (ref 30.0–36.0)
MCV: 92.9 fL (ref 78.0–100.0)
Platelets: 114 10*3/uL — ABNORMAL LOW (ref 150–400)
RBC: 4.2 MIL/uL — ABNORMAL LOW (ref 4.22–5.81)
RDW: 13.2 % (ref 11.5–15.5)
WBC: 10 10*3/uL (ref 4.0–10.5)

## 2015-05-09 LAB — MAGNESIUM: Magnesium: 1.9 mg/dL (ref 1.7–2.4)

## 2015-05-09 MED ORDER — HYDRALAZINE HCL 10 MG PO TABS: 10.0000 mg | ORAL_TABLET | Freq: Three times a day (TID) | ORAL | Status: DC

## 2015-05-09 MED ORDER — POLYETHYLENE GLYCOL 3350 17 G PO PACK: 17.0000 g | PACK | Freq: Every day | ORAL | Status: DC

## 2015-05-09 MED ORDER — NICOTINE 21 MG/24HR TD PT24: 21.0000 mg | MEDICATED_PATCH | Freq: Every day | TRANSDERMAL | Status: DC

## 2015-05-09 MED ORDER — BISACODYL 5 MG PO TBEC: 5.0000 mg | DELAYED_RELEASE_TABLET | Freq: Every day | ORAL | Status: DC | PRN

## 2015-05-09 MED ORDER — HYDROCODONE-ACETAMINOPHEN 5-325 MG PO TABS
1.0000 | ORAL_TABLET | Freq: Four times a day (QID) | ORAL | Status: DC | PRN
Start: 2015-05-09 — End: 2017-02-26

## 2015-05-09 MED ORDER — BISACODYL 10 MG RE SUPP
10.0000 mg | Freq: Once | RECTAL | Status: AC
  Administered 2015-05-09: 10 mg via RECTAL
  Filled 2015-05-09: qty 1

## 2015-05-09 MED ORDER — FERROUS SULFATE 325 (65 FE) MG PO TABS: 325.0000 mg | ORAL_TABLET | Freq: Every day | ORAL | Status: DC

## 2015-05-09 MED ORDER — METHOCARBAMOL 500 MG PO TABS: 500.0000 mg | ORAL_TABLET | Freq: Four times a day (QID) | ORAL | Status: DC | PRN

## 2015-05-09 MED ORDER — ACETAMINOPHEN 325 MG PO TABS: 650.0000 mg | ORAL_TABLET | Freq: Four times a day (QID) | ORAL | Status: DC | PRN

## 2015-05-09 MED ORDER — FOLIC ACID 1 MG PO TABS: 1.0000 mg | ORAL_TABLET | Freq: Every day | ORAL | Status: DC

## 2015-05-09 NOTE — Progress Notes (Signed)
Patient had 5 beats of V-tach. Vitals were:  98.1 F, HR 57, RR 20 , B/P 143/96 . Patient is asymptomatic. PCP on call to be notified.

## 2015-05-09 NOTE — Clinical Social Work Placement (Signed)
Patient is set to discharge to [facility] today. Patient & sister, [name] aware. Discharge packet given to RN, Deidre Ala. [facility] called for transport to pickup at 3:30pm.     Raynaldo Opitz, [place] Social Worker cell #: [phone]    CLINICAL SOCIAL WORK PLACEMENT  NOTE  Date:  05/09/2015  Patient Details  Name: Matthew Mahoney MRN: [id number] Date of Birth: 27-Sep-1936  Clinical Social Work is seeking post-discharge placement for this patient at the [place] level of care (*CSW will initial, date and re-position this form in  chart as items are completed):  Yes   Patient/family provided with [place] Work Department's list of facilities offering this level of care within the geographic area requested by the patient (or if unable, by the patient's family).  Yes   Patient/family informed of their freedom to choose among providers that offer the needed level of care, that participate in Medicare, Medicaid or managed care program needed by the patient, have an available bed and are willing to accept the patient.  Yes   Patient/family informed of [place]'s ownership interest in [facility] and [facility], as well as of the fact that they are under no obligation to receive care at these facilities.  PASRR submitted to EDS on 05/08/15     PASRR number received on 05/08/15     Existing PASRR number confirmed on       FL2 transmitted to all facilities in geographic area requested by pt/family on 05/08/15     FL2 transmitted to all facilities within larger geographic area on       Patient informed that his/her managed care company has contracts with or will negotiate with certain facilities, including the following:        Yes   Patient/family informed of bed offers received.  Patient chooses bed at [facility]     Physician recommends and patient chooses bed at      Patient to be  transferred to [facility] on 05/09/15.  Patient to be transferred to facility by [facility]     Patient family notified on 05/09/15 of transfer.  Name of family member notified:  patient's sister, [name] via phone     PHYSICIAN       Additional Comment:    _______________________________________________ Standley Brooking, LCSW 05/09/2015, 2:24 PM

## 2015-05-09 NOTE — Discharge Summary (Addendum)
Physician Discharge Summary  Matthew Mahoney DOB: [birth year] DOA: 05/05/2015  PCP: Leamon Arnt, MD  Admit date: 05/05/2015 Discharge date: 05/09/2015   Recommendations for Outpatient Follow-Up:   1. The patient is being D/C'd to a SNF for further rehabilitation. 2. Monitor for urinary retention.  Foley D/C'd prior to discharge.   Discharge Diagnosis:   Principal Problem:    Closed fracture of femur, neck Active Problems:    Hypertension    Prostate cancer    CVA (cerebral infarction)    CKD (chronic kidney disease), stage III    Atrial fibrillation    CAD (coronary artery disease)    Skin tear of forearm without complication    Chronic combined systolic and diastolic congestive heart failure    Nonsustained ventricular tachycardia    Urinary retention    Tobacco abuse    Alcohol abuse    Fall    Hypomagnesemia    Chronic respiratory failure    COPD   Discharge disposition:  SNF:  [facility]  Discharge Condition: Improved.  Diet recommendation: Low sodium, heart healthy.   Wound care: Change dressing as needed.   History of Present Illness:   Matthew Mahoney is an 79 y.o. male with complex and multiple medical conditions including HTN, HLD, combined systolic and diastolic CHF with 2 D ECHO in 2014 with EF 20 - 25% and grade II diastolic CHF, a-fib on AC with Eliquis, known history of alcohol abuse, COPD with ongoing tobacco abuse of the 2 pack per day habit, CAD, who was admitted 05/05/15 with a fall resulting in a right hip fracture.   Hospital Course by Problem:   Principal Problem:  Closed fracture of the right femur, neck secondary to fall - The patient was determined to be at mod risk for surgical intervention and pt made aware. - Underwent percutaneous pinning of right femoral neck fracture 05/07/15. - Physical therapy with weightbearing as tolerated. - Follow-up with orthopedic surgeon 05/22/15 at 8:15 AM.  Active  Problems:  Acute on Chronic combined systolic and diastolic CHF - Initial CXR with mild congestion treated with 1 dose of Lasix 40 mg IV. - Repeat CXR with improving edema. - 2 D ECHO done 05/06/15, EF 50 - 55%. - Cardiology following.   Hypertension - Continue Coreg, and hydralazine. Cozaar on hold secondary to bump in creatinine.  Can resume post discharge.   Chronic A-fib, CHADS2 = 5 / NSVT - The patient is rate controlled. Being monitored on telemetry. Had an episode of NSVT. Magnesium repleted. - Resumed Eliquis.   Hypomagnesemia - Supplemented.   Tobacco use, alcohol use  - The patient was counseled on cessation. - Continue to supplement thiamine and folic acid given alcohol use.   Chronic respiratory failure secondary to COPD in pt who is actively smoking - Continue broncho-dilators as needed.   Chronic thrombocytopenia - Mild. May be related to toxic effects of alcohol on bone marrow.   CKD (chronic kidney disease), stage III (baseline creatinine 1.6) - Slight postoperative bump in creatinine noted. - Creatinine improved today with holding Cozaar.  OK to resume.  Creatinine back to usual baseline values    Medical Consultants:    Cardiology, Dr. Martinique  Orthopedic Surgery, Dr. Lynann Bologna   Discharge Exam:   Filed Vitals:   05/09/15 0549  BP: 143/86  Pulse: 60  Temp: 98.1 F (36.7 C)  Resp: 18   Filed Vitals:   05/08/15 1644 05/08/15 1725 05/08/15 2136 05/09/15 0549  BP:  132/64 137/68 143/57 143/86  Pulse: 60 67 61 60  Temp:   98.5 F (36.9 C) 98.1 F (36.7 C)  TempSrc:   Oral Oral  Resp:   18 18  Height:      Weight:      SpO2:   97% 99%    Gen:  NAD Cardiovascular:  RRR, No M/R/G Respiratory: Lungs CTAB Gastrointestinal: Abdomen soft, NT/ND with normal active bowel sounds. Extremities: No C/E/C   The results of significant diagnostics from this hospitalization (including imaging, microbiology, ancillary and laboratory) are  listed below for reference.     Procedures and Diagnostic Studies:   Dg Chest 1 View  05/06/2015   CLINICAL DATA:  Status post fall.  Initial encounter.  EXAM: CHEST  1 VIEW  COMPARISON:  Chest radiograph performed 09/19/2013  FINDINGS: The lungs are well-aerated. Vascular congestion is noted, with mild bilateral atelectasis. There is no evidence of pleural effusion or pneumothorax.  The cardiomediastinal silhouette is mildly enlarged. No acute osseous abnormalities are seen.  IMPRESSION: Vascular congestion and mild cardiomegaly, with mild bilateral atelectasis. No displaced rib fracture seen.   Electronically Signed   By: Garald Balding M.D.   On: 05/06/2015 01:13   Dg Chest 2 View  05/07/2015   CLINICAL DATA:  Dyspnea, history of coronary artery disease, CHF, unspecified lung surgery  EXAM: CHEST  2 VIEW  COMPARISON:  Chest x-ray of May 06, 2015  FINDINGS: The lungs are adequately inflated. The pulmonary interstitium has improved with less vascular congestion noted. There is stable scarring in the right mid and lower lung. The cardiac silhouette is normal in size. There is tortuosity of the descending thoracic aorta. There is no pleural effusion. The bony thorax exhibits no acute abnormality.  IMPRESSION: Resolving pulmonary interstitial edema. Stable scarring in the right lung.   Electronically Signed   By: David  Martinique M.D.   On: 05/07/2015 13:56   Dg Hip Operative Unilat With Pelvis Right  05/07/2015   CLINICAL DATA:  Right hip fracture fixation.  EXAM: OPERATIVE RIGHT HIP (WITH PELVIS IF PERFORMED) 3 VIEWS  TECHNIQUE: Fluoroscopic spot image(s) were submitted for interpretation post-operatively.  FLUOROSCOPY TIME:  Fluoroscopy Time:  2 minutes 15 seconds  Number of Acquired Images:  3  COMPARISON:  05/06/2015  FINDINGS: Three intraoperative views of the right hip are submitted postoperatively for interpretation.  Three pins traverse the femoral neck fracture, in near-anatomic alignment and  position. No complicating features are identified.  IMPRESSION: Internal fixation of femoral neck fracture.   Electronically Signed   By: Margarette Canada M.D.   On: 05/07/2015 19:43   Dg Hip Unilat With Pelvis 2-3 Views Right  05/06/2015   CLINICAL DATA:  Acute onset of right hip pain, status post fall. Initial encounter.  EXAM: RIGHT HIP (WITH PELVIS) 2-3 VIEWS  COMPARISON:  None.  FINDINGS: There is suspicion of a minimally displaced transcervical fracture through the right femoral neck. No additional fractures are seen. The right femoral head remains seated at the acetabulum.  The patient's left femoral hardware is grossly unremarkable in appearance. The left hip joint remains intact. Mild sclerotic change is noted at the sacroiliac joints. Diffuse vascular calcifications are seen. The visualized bowel gas pattern is grossly unremarkable.  IMPRESSION: Suspect minimally displaced transcervical fracture through the right femoral neck.   Electronically Signed   By: Garald Balding M.D.   On: 05/06/2015 01:11     Labs:   Basic Metabolic Panel:  Recent Labs Lab  05/06/15 0444 05/06/15 1610 05/07/15 0402 05/08/15 0413 05/09/15 0435  NA 139 139 137 139 135  K 4.1 3.6 3.8 3.7 4.0  CL 106 101 100* 99* 102  CO2 22 25 24 26 22   GLUCOSE 115* 149* 116* 125* 126*  BUN 39* 35* 36* 42* 48*  CREATININE 1.57* 1.58* 1.55* 2.17* 1.66*  CALCIUM 9.9 9.7 9.8 9.1 8.6*  MG  --  1.4*  --  1.4* 1.9   GFR Estimated Creatinine Clearance: 40.9 mL/min (by C-G formula based on Cr of 1.66). Liver Function Tests:  Recent Labs Lab 05/06/15 0038 05/06/15 0444  AST 18 17  ALT 15* 17  ALKPHOS 88 85  BILITOT 0.6 1.0  PROT 7.3 7.3  ALBUMIN 4.2 4.1   Coagulation profile  Recent Labs Lab 05/06/15 0038  INR 1.09    CBC:  Recent Labs Lab 05/06/15 0038 05/06/15 0444 05/07/15 0402 05/08/15 0413 05/09/15 0435  WBC 12.2* 11.1* 11.8* 12.2* 10.0  NEUTROABS 9.5* 8.0*  --   --   --   HGB 12.9* 12.9* 13.7 13.3  12.4*  HCT 39.9 40.4 41.5 41.6 39.0  MCV 90.9 92.0 90.0 92.9 92.9  PLT 130* 122* 126* 123* 114*   Microbiology Recent Results (from the past 240 hour(s))  Surgical pcr screen     Status: None   Collection Time: 05/06/15  8:10 AM  Result Value Ref Range Status   MRSA, PCR NEGATIVE NEGATIVE Final   Staphylococcus aureus NEGATIVE NEGATIVE Final    Comment:        The Xpert SA Assay (FDA approved for NASAL specimens in patients over 81 years of age), is one component of a comprehensive surveillance program.  Test performance has been validated by [facility] for patients greater than or equal to 77 year old. It is not intended to diagnose infection nor to guide or monitor treatment.      Discharge Instructions:   Discharge Instructions    Call MD for:  extreme fatigue    Complete by:  As directed      Call MD for:  redness, tenderness, or signs of infection (pain, swelling, redness, odor or green/yellow discharge around incision site)    Complete by:  As directed      Call MD for:  severe uncontrolled pain    Complete by:  As directed      Call MD for:  temperature >100.4    Complete by:  As directed      Diet - low sodium heart healthy    Complete by:  As directed      Increase activity slowly    Complete by:  As directed             Medication List    STOP taking these medications        olmesartan 40 MG tablet  Commonly known as:  BENICAR      TAKE these medications        acetaminophen 325 MG tablet  Commonly known as:  TYLENOL  Take 2 tablets (650 mg total) by mouth every 6 (six) hours as needed for mild pain (or Fever >/= 101).     albuterol 108 (90 BASE) MCG/ACT inhaler  Commonly known as:  PROVENTIL HFA;VENTOLIN HFA  Inhale 1 puff into the lungs every 6 (six) hours as needed for wheezing or shortness of breath.     apixaban 2.5 MG Tabs tablet  Commonly known as:  ELIQUIS  Take 1 tablet (2.5  mg total) by mouth 2 (two) times daily.      atorvastatin 40 MG tablet  Commonly known as:  LIPITOR  Take 40 mg by mouth daily.     bicalutamide 50 MG tablet  Commonly known as:  CASODEX  Take 50 mg by mouth daily.     bisacodyl 5 MG EC tablet  Commonly known as:  DULCOLAX  Take 1 tablet (5 mg total) by mouth daily as needed for moderate constipation.     carvedilol 3.125 MG tablet  Commonly known as:  COREG  Take 1 tablet (3.125 mg total) by mouth 2 (two) times daily with a meal.     ferrous sulfate 325 (65 FE) MG tablet  Take 1 tablet (325 mg total) by mouth daily with breakfast.     folic acid 1 MG tablet  Commonly known as:  FOLVITE  Take 1 tablet (1 mg total) by mouth daily.     furosemide 20 MG tablet  Commonly known as:  LASIX  Take 20 mg by mouth daily as needed for fluid.     hydrALAZINE 10 MG tablet  Commonly known as:  APRESOLINE  Take 1 tablet (10 mg total) by mouth every 8 (eight) hours.     HYDROcodone-acetaminophen 5-325 MG per tablet  Commonly known as:  NORCO/VICODIN  Take 1-2 tablets by mouth every 6 (six) hours as needed for moderate pain.     latanoprost 0.005 % ophthalmic solution  Commonly known as:  XALATAN  Place 1 drop into both eyes at bedtime.     losartan 100 MG tablet  Commonly known as:  COZAAR  Take 100 mg by mouth daily.     magnesium gluconate 500 MG tablet  Commonly known as:  MAGONATE  Take 500 mg by mouth daily.     methocarbamol 500 MG tablet  Commonly known as:  ROBAXIN  Take 1 tablet (500 mg total) by mouth every 6 (six) hours as needed for muscle spasms.     nicotine 21 mg/24hr patch  Commonly known as:  NICODERM CQ - dosed in mg/24 hours  Place 1 patch (21 mg total) onto the skin daily.     omega-3 acid ethyl esters 1 G capsule  Commonly known as:  LOVAZA  Take 1 g by mouth daily.     pantoprazole 40 MG tablet  Commonly known as:  PROTONIX  Take 40 mg by mouth daily.     polyethylene glycol packet  Commonly known as:  MIRALAX / GLYCOLAX  Take 17 g by mouth  daily.     PRESERVISION AREDS PO  Take 1 tablet by mouth 2 (two) times daily.     thiamine 100 MG tablet  Take 100 mg by mouth daily.          Time coordinating discharge: 35 minutes.  Signed:  Duy Lemming  Pager [phone] [facility] 05/09/2015, 9:58 AM

## 2015-05-09 NOTE — Care Management Note (Signed)
Case Management Note  Patient Details  Name: Matthew Mahoney MRN: [id number] Date of Birth: Apr 14, 1936  Subjective/Objective:                    Action/Plan:d/c snf.   Expected Discharge Date:                  Expected Discharge Plan:  [place]  In-House Referral:     Discharge planning Services  CM Consult  Post Acute Care Choice:    Choice offered to:     DME Arranged:    DME Agency:     HH Arranged:    [place]:     Status of Service:  Completed, signed off  Medicare Important Message Given:  Yes Date Medicare IM Given:  05/09/15 Medicare IM give by:  Dessa Phi Date Additional Medicare IM Given:    Additional Medicare Important Message give by:     If discussed at [place] of Stay Meetings, dates discussed:    Additional Comments:  Dessa Phi, RN 05/09/2015, 11:36 AM

## 2015-05-09 NOTE — Progress Notes (Signed)
Report called to Dennie Fetters at 1645 at [facility]. Awaiting transport for discharge.  Rosine Beat, RN

## 2015-05-09 NOTE — Discharge Instructions (Signed)
Hip Fracture °A hip fracture is a fracture of the upper part of your thigh bone (femur).  °CAUSES °A hip fracture is caused by a direct blow to the side of your hip. This is usually the result of a fall but can occur in other circumstances, such as an automobile accident. °RISK FACTORS °There is an increased risk of hip fractures in people with: °· An unsteady walking pattern (gait) and those with conditions that contribute to poor balance, such as Parkinson's disease or dementia. °· Osteopenia and osteoporosis. °· Cancer that spreads to the leg bones. °· Certain metabolic diseases. °SYMPTOMS  °Symptoms of hip fracture include: °· Pain over the injured hip. °· Inability to put weight on the leg in which the fracture occurred (although, some patients are able to walk after a hip fracture). °· Toes and foot of the affected leg point outward when you lie down. °DIAGNOSIS °A physical exam can determine if a hip fracture is likely to have occurred. X-ray exams are needed to confirm the fracture and to look for other injuries. The X-ray exam can help to determine the type of hip fracture. Rarely, the fracture is not visible on an X-ray image and a CT scan or MRI will have to be done. °TREATMENT  °The treatment for a fracture is usually surgery. This means using a screw, nail, or rod to hold the bones in place.  °HOME CARE INSTRUCTIONS °Take all medicines as directed by your health care provider. °SEEK MEDICAL CARE IF: °Pain continues, even after taking pain medicine. °MAKE SURE YOU: °· Understand these instructions.   °· Will watch your condition. °· Will get help right away if you are not doing well or get worse. °Document Released: 11/23/2005 Document Revised: 11/28/2013 Document Reviewed: 07/05/2013 °[employer]® Patient Information ©2015 [employer]. This information is not intended to replace advice given to you by your health care provider. Make sure you discuss any questions you have with your health care  provider. ° °

## 2015-05-10 DIAGNOSIS — I482 Chronic atrial fibrillation: Secondary | ICD-10-CM | POA: Diagnosis not present

## 2015-05-10 DIAGNOSIS — I6789 Other cerebrovascular disease: Secondary | ICD-10-CM | POA: Diagnosis not present

## 2015-05-10 DIAGNOSIS — S72001A Fracture of unspecified part of neck of right femur, initial encounter for closed fracture: Secondary | ICD-10-CM | POA: Diagnosis not present

## 2015-05-10 DIAGNOSIS — I5022 Chronic systolic (congestive) heart failure: Secondary | ICD-10-CM | POA: Diagnosis not present

## 2015-05-10 DIAGNOSIS — N183 Chronic kidney disease, stage 3 (moderate): Secondary | ICD-10-CM | POA: Diagnosis not present

## 2015-05-22 DIAGNOSIS — Z9889 Other specified postprocedural states: Secondary | ICD-10-CM | POA: Diagnosis not present

## 2015-05-30 DIAGNOSIS — M6281 Muscle weakness (generalized): Secondary | ICD-10-CM | POA: Diagnosis not present

## 2015-05-30 DIAGNOSIS — I4891 Unspecified atrial fibrillation: Secondary | ICD-10-CM | POA: Diagnosis not present

## 2015-05-30 DIAGNOSIS — S72109E Unspecified trochanteric fracture of unspecified femur, subsequent encounter for open fracture type I or II with routine healing: Secondary | ICD-10-CM | POA: Diagnosis not present

## 2015-05-30 DIAGNOSIS — I5023 Acute on chronic systolic (congestive) heart failure: Secondary | ICD-10-CM | POA: Diagnosis not present

## 2015-05-30 DIAGNOSIS — R278 Other lack of coordination: Secondary | ICD-10-CM | POA: Diagnosis not present

## 2015-06-03 DIAGNOSIS — I4891 Unspecified atrial fibrillation: Secondary | ICD-10-CM | POA: Diagnosis not present

## 2015-06-03 DIAGNOSIS — R278 Other lack of coordination: Secondary | ICD-10-CM | POA: Diagnosis not present

## 2015-06-03 DIAGNOSIS — S72109E Unspecified trochanteric fracture of unspecified femur, subsequent encounter for open fracture type I or II with routine healing: Secondary | ICD-10-CM | POA: Diagnosis not present

## 2015-06-03 DIAGNOSIS — I5023 Acute on chronic systolic (congestive) heart failure: Secondary | ICD-10-CM | POA: Diagnosis not present

## 2015-06-03 DIAGNOSIS — M6281 Muscle weakness (generalized): Secondary | ICD-10-CM | POA: Diagnosis not present

## 2015-06-04 DIAGNOSIS — I5023 Acute on chronic systolic (congestive) heart failure: Secondary | ICD-10-CM | POA: Diagnosis not present

## 2015-06-04 DIAGNOSIS — S72109E Unspecified trochanteric fracture of unspecified femur, subsequent encounter for open fracture type I or II with routine healing: Secondary | ICD-10-CM | POA: Diagnosis not present

## 2015-06-04 DIAGNOSIS — M6281 Muscle weakness (generalized): Secondary | ICD-10-CM | POA: Diagnosis not present

## 2015-06-04 DIAGNOSIS — R278 Other lack of coordination: Secondary | ICD-10-CM | POA: Diagnosis not present

## 2015-06-04 DIAGNOSIS — I4891 Unspecified atrial fibrillation: Secondary | ICD-10-CM | POA: Diagnosis not present

## 2015-06-06 DIAGNOSIS — I4891 Unspecified atrial fibrillation: Secondary | ICD-10-CM | POA: Diagnosis not present

## 2015-06-06 DIAGNOSIS — I5023 Acute on chronic systolic (congestive) heart failure: Secondary | ICD-10-CM | POA: Diagnosis not present

## 2015-06-06 DIAGNOSIS — S72011D Unspecified intracapsular fracture of right femur, subsequent encounter for closed fracture with routine healing: Secondary | ICD-10-CM | POA: Diagnosis not present

## 2015-06-06 DIAGNOSIS — S72109E Unspecified trochanteric fracture of unspecified femur, subsequent encounter for open fracture type I or II with routine healing: Secondary | ICD-10-CM | POA: Diagnosis not present

## 2015-06-06 DIAGNOSIS — R278 Other lack of coordination: Secondary | ICD-10-CM | POA: Diagnosis not present

## 2015-06-06 DIAGNOSIS — M6281 Muscle weakness (generalized): Secondary | ICD-10-CM | POA: Diagnosis not present

## 2015-06-11 DIAGNOSIS — I4891 Unspecified atrial fibrillation: Secondary | ICD-10-CM | POA: Diagnosis not present

## 2015-06-11 DIAGNOSIS — S72109E Unspecified trochanteric fracture of unspecified femur, subsequent encounter for open fracture type I or II with routine healing: Secondary | ICD-10-CM | POA: Diagnosis not present

## 2015-06-11 DIAGNOSIS — I5023 Acute on chronic systolic (congestive) heart failure: Secondary | ICD-10-CM | POA: Diagnosis not present

## 2015-06-11 DIAGNOSIS — M6281 Muscle weakness (generalized): Secondary | ICD-10-CM | POA: Diagnosis not present

## 2015-06-11 DIAGNOSIS — R278 Other lack of coordination: Secondary | ICD-10-CM | POA: Diagnosis not present

## 2015-06-13 DIAGNOSIS — I5023 Acute on chronic systolic (congestive) heart failure: Secondary | ICD-10-CM | POA: Diagnosis not present

## 2015-06-13 DIAGNOSIS — R278 Other lack of coordination: Secondary | ICD-10-CM | POA: Diagnosis not present

## 2015-06-13 DIAGNOSIS — I4891 Unspecified atrial fibrillation: Secondary | ICD-10-CM | POA: Diagnosis not present

## 2015-06-13 DIAGNOSIS — M6281 Muscle weakness (generalized): Secondary | ICD-10-CM | POA: Diagnosis not present

## 2015-06-13 DIAGNOSIS — S72109E Unspecified trochanteric fracture of unspecified femur, subsequent encounter for open fracture type I or II with routine healing: Secondary | ICD-10-CM | POA: Diagnosis not present

## 2015-06-14 DIAGNOSIS — H4011X2 Primary open-angle glaucoma, moderate stage: Secondary | ICD-10-CM | POA: Diagnosis not present

## 2015-06-17 DIAGNOSIS — R278 Other lack of coordination: Secondary | ICD-10-CM | POA: Diagnosis not present

## 2015-06-17 DIAGNOSIS — S72109E Unspecified trochanteric fracture of unspecified femur, subsequent encounter for open fracture type I or II with routine healing: Secondary | ICD-10-CM | POA: Diagnosis not present

## 2015-06-17 DIAGNOSIS — I4891 Unspecified atrial fibrillation: Secondary | ICD-10-CM | POA: Diagnosis not present

## 2015-06-17 DIAGNOSIS — I5023 Acute on chronic systolic (congestive) heart failure: Secondary | ICD-10-CM | POA: Diagnosis not present

## 2015-06-17 DIAGNOSIS — M6281 Muscle weakness (generalized): Secondary | ICD-10-CM | POA: Diagnosis not present

## 2015-06-20 DIAGNOSIS — S72109E Unspecified trochanteric fracture of unspecified femur, subsequent encounter for open fracture type I or II with routine healing: Secondary | ICD-10-CM | POA: Diagnosis not present

## 2015-06-20 DIAGNOSIS — R278 Other lack of coordination: Secondary | ICD-10-CM | POA: Diagnosis not present

## 2015-06-20 DIAGNOSIS — M6281 Muscle weakness (generalized): Secondary | ICD-10-CM | POA: Diagnosis not present

## 2015-06-20 DIAGNOSIS — I4891 Unspecified atrial fibrillation: Secondary | ICD-10-CM | POA: Diagnosis not present

## 2015-06-20 DIAGNOSIS — I5023 Acute on chronic systolic (congestive) heart failure: Secondary | ICD-10-CM | POA: Diagnosis not present

## 2015-06-24 DIAGNOSIS — S72109E Unspecified trochanteric fracture of unspecified femur, subsequent encounter for open fracture type I or II with routine healing: Secondary | ICD-10-CM | POA: Diagnosis not present

## 2015-06-24 DIAGNOSIS — R278 Other lack of coordination: Secondary | ICD-10-CM | POA: Diagnosis not present

## 2015-06-24 DIAGNOSIS — I4891 Unspecified atrial fibrillation: Secondary | ICD-10-CM | POA: Diagnosis not present

## 2015-06-24 DIAGNOSIS — M6281 Muscle weakness (generalized): Secondary | ICD-10-CM | POA: Diagnosis not present

## 2015-06-24 DIAGNOSIS — I5023 Acute on chronic systolic (congestive) heart failure: Secondary | ICD-10-CM | POA: Diagnosis not present

## 2015-06-25 DIAGNOSIS — Z9889 Other specified postprocedural states: Secondary | ICD-10-CM | POA: Diagnosis not present

## 2015-06-26 DIAGNOSIS — R278 Other lack of coordination: Secondary | ICD-10-CM | POA: Diagnosis not present

## 2015-06-26 DIAGNOSIS — M6281 Muscle weakness (generalized): Secondary | ICD-10-CM | POA: Diagnosis not present

## 2015-06-26 DIAGNOSIS — I5023 Acute on chronic systolic (congestive) heart failure: Secondary | ICD-10-CM | POA: Diagnosis not present

## 2015-06-26 DIAGNOSIS — I4891 Unspecified atrial fibrillation: Secondary | ICD-10-CM | POA: Diagnosis not present

## 2015-06-26 DIAGNOSIS — S72109E Unspecified trochanteric fracture of unspecified femur, subsequent encounter for open fracture type I or II with routine healing: Secondary | ICD-10-CM | POA: Diagnosis not present

## 2015-06-27 DIAGNOSIS — S728X1D Other fracture of right femur, subsequent encounter for closed fracture with routine healing: Secondary | ICD-10-CM | POA: Diagnosis not present

## 2015-06-27 DIAGNOSIS — R2689 Other abnormalities of gait and mobility: Secondary | ICD-10-CM | POA: Diagnosis not present

## 2015-06-28 DIAGNOSIS — H4011X3 Primary open-angle glaucoma, severe stage: Secondary | ICD-10-CM | POA: Diagnosis not present

## 2015-06-28 DIAGNOSIS — J441 Chronic obstructive pulmonary disease with (acute) exacerbation: Secondary | ICD-10-CM | POA: Diagnosis not present

## 2015-07-02 DIAGNOSIS — R2689 Other abnormalities of gait and mobility: Secondary | ICD-10-CM | POA: Diagnosis not present

## 2015-07-02 DIAGNOSIS — S728X1D Other fracture of right femur, subsequent encounter for closed fracture with routine healing: Secondary | ICD-10-CM | POA: Diagnosis not present

## 2015-07-03 DIAGNOSIS — R2689 Other abnormalities of gait and mobility: Secondary | ICD-10-CM | POA: Diagnosis not present

## 2015-07-03 DIAGNOSIS — S728X1D Other fracture of right femur, subsequent encounter for closed fracture with routine healing: Secondary | ICD-10-CM | POA: Diagnosis not present

## 2015-07-09 DIAGNOSIS — R2689 Other abnormalities of gait and mobility: Secondary | ICD-10-CM | POA: Diagnosis not present

## 2015-07-09 DIAGNOSIS — S728X1D Other fracture of right femur, subsequent encounter for closed fracture with routine healing: Secondary | ICD-10-CM | POA: Diagnosis not present

## 2015-07-10 DIAGNOSIS — I10 Essential (primary) hypertension: Secondary | ICD-10-CM | POA: Diagnosis not present

## 2015-07-10 DIAGNOSIS — I5020 Unspecified systolic (congestive) heart failure: Secondary | ICD-10-CM | POA: Diagnosis not present

## 2015-07-10 DIAGNOSIS — L0291 Cutaneous abscess, unspecified: Secondary | ICD-10-CM | POA: Diagnosis not present

## 2015-07-10 DIAGNOSIS — J449 Chronic obstructive pulmonary disease, unspecified: Secondary | ICD-10-CM | POA: Diagnosis not present

## 2015-07-11 DIAGNOSIS — S728X1D Other fracture of right femur, subsequent encounter for closed fracture with routine healing: Secondary | ICD-10-CM | POA: Diagnosis not present

## 2015-07-11 DIAGNOSIS — R2689 Other abnormalities of gait and mobility: Secondary | ICD-10-CM | POA: Diagnosis not present

## 2015-07-15 DIAGNOSIS — L0291 Cutaneous abscess, unspecified: Secondary | ICD-10-CM | POA: Diagnosis not present

## 2015-07-16 DIAGNOSIS — R2689 Other abnormalities of gait and mobility: Secondary | ICD-10-CM | POA: Diagnosis not present

## 2015-07-16 DIAGNOSIS — S728X1D Other fracture of right femur, subsequent encounter for closed fracture with routine healing: Secondary | ICD-10-CM | POA: Diagnosis not present

## 2015-07-18 DIAGNOSIS — R2689 Other abnormalities of gait and mobility: Secondary | ICD-10-CM | POA: Diagnosis not present

## 2015-07-18 DIAGNOSIS — S728X1D Other fracture of right femur, subsequent encounter for closed fracture with routine healing: Secondary | ICD-10-CM | POA: Diagnosis not present

## 2015-07-22 DIAGNOSIS — I10 Essential (primary) hypertension: Secondary | ICD-10-CM | POA: Diagnosis not present

## 2015-07-22 DIAGNOSIS — E876 Hypokalemia: Secondary | ICD-10-CM | POA: Diagnosis not present

## 2015-07-23 DIAGNOSIS — R2689 Other abnormalities of gait and mobility: Secondary | ICD-10-CM | POA: Diagnosis not present

## 2015-07-23 DIAGNOSIS — S728X1D Other fracture of right femur, subsequent encounter for closed fracture with routine healing: Secondary | ICD-10-CM | POA: Diagnosis not present

## 2015-07-25 DIAGNOSIS — R2689 Other abnormalities of gait and mobility: Secondary | ICD-10-CM | POA: Diagnosis not present

## 2015-07-25 DIAGNOSIS — S728X1D Other fracture of right femur, subsequent encounter for closed fracture with routine healing: Secondary | ICD-10-CM | POA: Diagnosis not present

## 2015-07-29 DIAGNOSIS — I6789 Other cerebrovascular disease: Secondary | ICD-10-CM | POA: Diagnosis not present

## 2015-07-29 DIAGNOSIS — I10 Essential (primary) hypertension: Secondary | ICD-10-CM | POA: Diagnosis not present

## 2015-07-29 DIAGNOSIS — J441 Chronic obstructive pulmonary disease with (acute) exacerbation: Secondary | ICD-10-CM | POA: Diagnosis not present

## 2015-07-29 DIAGNOSIS — J449 Chronic obstructive pulmonary disease, unspecified: Secondary | ICD-10-CM | POA: Diagnosis not present

## 2015-07-29 DIAGNOSIS — I5020 Unspecified systolic (congestive) heart failure: Secondary | ICD-10-CM | POA: Diagnosis not present

## 2015-07-31 DIAGNOSIS — S728X1D Other fracture of right femur, subsequent encounter for closed fracture with routine healing: Secondary | ICD-10-CM | POA: Diagnosis not present

## 2015-07-31 DIAGNOSIS — R2689 Other abnormalities of gait and mobility: Secondary | ICD-10-CM | POA: Diagnosis not present

## 2015-08-01 DIAGNOSIS — S728X1D Other fracture of right femur, subsequent encounter for closed fracture with routine healing: Secondary | ICD-10-CM | POA: Diagnosis not present

## 2015-08-01 DIAGNOSIS — R2689 Other abnormalities of gait and mobility: Secondary | ICD-10-CM | POA: Diagnosis not present

## 2015-08-06 ENCOUNTER — Ambulatory Visit: Payer: Medicare Other | Admitting: Cardiovascular Disease

## 2015-08-06 DIAGNOSIS — S728X1D Other fracture of right femur, subsequent encounter for closed fracture with routine healing: Secondary | ICD-10-CM | POA: Diagnosis not present

## 2015-08-06 DIAGNOSIS — R2689 Other abnormalities of gait and mobility: Secondary | ICD-10-CM | POA: Diagnosis not present

## 2015-08-06 DIAGNOSIS — Z9889 Other specified postprocedural states: Secondary | ICD-10-CM | POA: Diagnosis not present

## 2015-08-08 DIAGNOSIS — S728X1D Other fracture of right femur, subsequent encounter for closed fracture with routine healing: Secondary | ICD-10-CM | POA: Diagnosis not present

## 2015-08-08 DIAGNOSIS — R2689 Other abnormalities of gait and mobility: Secondary | ICD-10-CM | POA: Diagnosis not present

## 2015-08-13 DIAGNOSIS — S728X1D Other fracture of right femur, subsequent encounter for closed fracture with routine healing: Secondary | ICD-10-CM | POA: Diagnosis not present

## 2015-08-13 DIAGNOSIS — R2689 Other abnormalities of gait and mobility: Secondary | ICD-10-CM | POA: Diagnosis not present

## 2015-08-15 DIAGNOSIS — S728X1D Other fracture of right femur, subsequent encounter for closed fracture with routine healing: Secondary | ICD-10-CM | POA: Diagnosis not present

## 2015-08-15 DIAGNOSIS — R2689 Other abnormalities of gait and mobility: Secondary | ICD-10-CM | POA: Diagnosis not present

## 2015-08-20 DIAGNOSIS — S728X1D Other fracture of right femur, subsequent encounter for closed fracture with routine healing: Secondary | ICD-10-CM | POA: Diagnosis not present

## 2015-08-20 DIAGNOSIS — R2689 Other abnormalities of gait and mobility: Secondary | ICD-10-CM | POA: Diagnosis not present

## 2015-08-22 DIAGNOSIS — R2689 Other abnormalities of gait and mobility: Secondary | ICD-10-CM | POA: Diagnosis not present

## 2015-08-22 DIAGNOSIS — S728X1D Other fracture of right femur, subsequent encounter for closed fracture with routine healing: Secondary | ICD-10-CM | POA: Diagnosis not present

## 2015-08-27 DIAGNOSIS — S728X1D Other fracture of right femur, subsequent encounter for closed fracture with routine healing: Secondary | ICD-10-CM | POA: Diagnosis not present

## 2015-08-27 DIAGNOSIS — R2689 Other abnormalities of gait and mobility: Secondary | ICD-10-CM | POA: Diagnosis not present

## 2015-08-29 DIAGNOSIS — J441 Chronic obstructive pulmonary disease with (acute) exacerbation: Secondary | ICD-10-CM | POA: Diagnosis not present

## 2015-09-02 ENCOUNTER — Encounter: Payer: Self-pay | Admitting: Cardiovascular Disease

## 2015-09-02 ENCOUNTER — Ambulatory Visit (INDEPENDENT_AMBULATORY_CARE_PROVIDER_SITE_OTHER): Payer: Medicare Other | Admitting: Cardiovascular Disease

## 2015-09-02 VITALS — BP 190/90 | HR 50 | Ht 72.0 in | Wt 198.8 lb

## 2015-09-02 DIAGNOSIS — I482 Chronic atrial fibrillation, unspecified: Secondary | ICD-10-CM

## 2015-09-02 DIAGNOSIS — I5042 Chronic combined systolic (congestive) and diastolic (congestive) heart failure: Secondary | ICD-10-CM | POA: Diagnosis not present

## 2015-09-02 DIAGNOSIS — I10 Essential (primary) hypertension: Secondary | ICD-10-CM | POA: Diagnosis not present

## 2015-09-02 DIAGNOSIS — I639 Cerebral infarction, unspecified: Secondary | ICD-10-CM

## 2015-09-02 MED ORDER — HYDROCHLOROTHIAZIDE 25 MG PO TABS: 25.0000 mg | ORAL_TABLET | Freq: Every day | ORAL | Status: DC

## 2015-09-02 MED ORDER — POTASSIUM CHLORIDE ER 10 MEQ PO TBCR: 10.0000 meq | EXTENDED_RELEASE_TABLET | Freq: Every day | ORAL | Status: DC

## 2015-09-02 NOTE — Patient Instructions (Signed)
Medication Instructions:  STOP Lasix START HCTZ (Hydrochlorothiazide) 25 mg once daily START Kdur (potassium supplement) 10 meq once daily   Labwork: Your physician recommends that you return for lab work in: 3 weeks for basic metabolic panel   Testing/Procedures: None Ordered   Follow-Up: Your physician wants you to follow-up in: 6 months with Dr. Acie Fredrickson.  You will receive a reminder letter in the mail two months in advance. If you don't receive a letter, please call our office to schedule the follow-up appointment.

## 2015-09-02 NOTE — Progress Notes (Signed)
[name] Date of Birth: 1936/12/06 Medical Record #[id number]   problem list 1. Essential hypertension 2. History prostate cancer 3. Coronary artery disease  4.  Atrial fib  5. CVA 6. Hip Fracture 7. COPD  8. CKD 9.   History of Present Illness: Matthew Mahoney is seen back today for a one month follow up visit.  He has a history of HTN, smoking and prostate cancer. Known CAD with remote stents and kept on Plavix. His other issues include substance abuse with alcohol and tobacco, prior CVA, AS, COPD, CKD, and thrombocytopenia.   Last seen by Dr. Acie Fredrickson back in 2012. He has had a 1st degree AV block with an incomplete RBBB and left anterior fascicular block reported in the past. Last echo in 2010 with a normal EF and mild aortic sclerosis.   Admitted early in 2014 with a left hip fracture. He had slipped and fell on the ice. Had ORIF on 01/21/13. Tolerated ok. Some concern about his rhythm. He apparently had multiple PAC's on the monitor but it looks like he stayed in sinus for that entire admission. CHADS is only a 1.   I saw him back in March of 2014 for a post hospital visit. He was at [employer]. Seemed to be doing ok.   He was admitted back in October with slurred speech and left sided facial droop. MRI showed 5 mm acute infarct - not treated with TPA due to delay of arrival. He was started on Eliquis, TEE attempted but unsuccessful due to desaturation. Had documented atrial fib/flutter (very first time noted) which was presumed to be the cause of his CVA. EF of 20 to 25% - there was talk of need for a stress test - this has not been carried out. Was given antibiotics for a presumed pneumonia.   Seen a month ago - Told me that he went to [facility] for a few weeks after his stroke. Now at home. His sister comes once a week to clean and fill his pill box. He was not short of breath. Not dizzy or lightheaded. Hds had 2 falls since he has been home - no significant injury noted.  Has chronic swelling in his legs - takes Lasix if it extends up to his thighs - tells me he has not really used any Lasix since he has been home. Saw neurology and was told that things were good. Wanting to get his teeth fixed. He thought he was doing ok. Still smoking 1 pk per day and drinking 3 to 4 oz of bourbon daily. Dr. Theone Murdoch' notes reviewed - his CCB has been cut back due to low HR/BP.   I left him on his Eliquis. We did not update his myoview. His prognosis is felt to be poor - mainly due to his social situation.  Comes back today. Here alone. Says he is doing ok. Not short of breath. He will use Lasix prn if he has more swelling - will do this for about 3 days and then stop. No falls. No chest pain. No awareness of his atrial fib. Not totally convinced that he is taking his medicines right. Asking about a Part D plan. He is smoking and drinking - has "3 good bourbons with sprite" every day. Says he is not driving while drinking.   Sept. 26, 2016: Seen back for visit.  I last saw him 4 years ago.  Was seen by Cecille Rubin 1 year ago. Has atrial fib Eats a  very salty diet - lots of fast foods, canned foods.  Walks with a walker, does not get any exercise.  Has slowed his ETOH intake    Current Outpatient Prescriptions  Medication Sig Dispense Refill  . acetaminophen (TYLENOL) 325 MG tablet Take 2 tablets (650 mg total) by mouth every 6 (six) hours as needed for mild pain (or Fever >/= 101).    Marland Kitchen albuterol (PROVENTIL HFA;VENTOLIN HFA) 108 (90 BASE) MCG/ACT inhaler Inhale 1 puff into the lungs every 6 (six) hours as needed for wheezing or shortness of breath.    Marland Kitchen apixaban (ELIQUIS) 2.5 MG TABS tablet Take 1 tablet (2.5 mg total) by mouth 2 (two) times daily. 60 tablet 0  . atorvastatin (LIPITOR) 40 MG tablet Take 40 mg by mouth daily.    . bicalutamide (CASODEX) 50 MG tablet Take 50 mg by mouth daily.    . bisacodyl (DULCOLAX) 5 MG EC tablet Take 1 tablet (5 mg total) by mouth daily as needed for  moderate constipation. 30 tablet 0  . carvedilol (COREG) 3.125 MG tablet Take 1 tablet (3.125 mg total) by mouth 2 (two) times daily with a meal. 60 tablet 4  . ferrous sulfate 325 (65 FE) MG tablet Take 1 tablet (325 mg total) by mouth daily with breakfast.  3  . folic acid (FOLVITE) 1 MG tablet Take 1 tablet (1 mg total) by mouth daily.    . furosemide (LASIX) 20 MG tablet Take 20 mg by mouth daily as needed for fluid.     . hydrALAZINE (APRESOLINE) 10 MG tablet Take 1 tablet (10 mg total) by mouth every 8 (eight) hours.    Marland Kitchen HYDROcodone-acetaminophen (NORCO/VICODIN) 5-325 MG per tablet Take 1-2 tablets by mouth every 6 (six) hours as needed for moderate pain. 30 tablet 0  . latanoprost (XALATAN) 0.005 % ophthalmic solution Place 1 drop into both eyes at bedtime.    Marland Kitchen losartan (COZAAR) 100 MG tablet Take 100 mg by mouth daily.    . magnesium gluconate (MAGONATE) 500 MG tablet Take 500 mg by mouth daily.    . methocarbamol (ROBAXIN) 500 MG tablet Take 1 tablet (500 mg total) by mouth every 6 (six) hours as needed for muscle spasms.    . Multiple Vitamins-Minerals (PRESERVISION AREDS PO) Take 1 tablet by mouth 2 (two) times daily.    . nicotine (NICODERM CQ - DOSED IN MG/24 HOURS) 21 mg/24hr patch Place 1 patch (21 mg total) onto the skin daily. 28 patch 0  . omega-3 acid ethyl esters (LOVAZA) 1 G capsule Take 1 g by mouth daily.     . pantoprazole (PROTONIX) 40 MG tablet Take 40 mg by mouth daily.    . polyethylene glycol (MIRALAX / GLYCOLAX) packet Take 17 g by mouth daily. 14 each 0  . thiamine 100 MG tablet Take 100 mg by mouth daily.     No current facility-administered medications for this visit.    Allergies  Allergen Reactions  . Plavix [Clopidogrel Bisulfate] Other (See Comments)    Unknown reaction    Past Medical History  Diagnosis Date  . Hypertension   . Edema of leg   . COPD (chronic obstructive pulmonary disease)   . Choledocholithiasis   . Coronary arteriosclerosis     . Hyperlipidemia   . Ichthyosis   . Mitral regurgitation   . Onychomycosis   . Vitamin D deficiency   . Aortic stenosis   . Hyperkalemia   . Hypocalcemia   . Lymphadenopathy   .  Prostate cancer   . Status post lung surgery 12/07/2001    BENIGHN LESION ON RIGHT LUNG REMOVED  . Ascites     HISTORY  . History of colonic diverticulitis   . Olecranon bursitis   . GI bleed     a. 03/2013 a/w hemorrhagic shock - EGD on 03/1013, which revealed active bleeding from proximal duodenum, with complete hemostasis following epinephrine injection therapy. At least 2 duodenal ulcers identified.  . Alcohol abuse 01/21/2013  . Chronic combined systolic and diastolic congestive heart failure 05/06/2015  . UNSPECIFIED PERIPHERAL VASCULAR DISEASE 02/18/2011    Qualifier: Diagnosis of  By: Terald Sleeper    . Hip fracture, left 01/21/2013  . Hemorrhagic shock 03/16/2013  . GIB (gastrointestinal bleeding) 03/16/2013  . CAP (community acquired pneumonia) 09/19/2013  . CVA (cerebral infarction) 09/19/2013  . Atrial fibrillation 09/23/2013  . Secondary cardiomyopathy, unspecified 09/23/2013  . Atrial flutter 09/25/2013  . Conduction disorder of the heart 09/25/2013  . Thrombocytopenia 09/25/2013  . GERD (gastroesophageal reflux disease) 10/11/2013  . CAD (coronary artery disease) 10/11/2013  . Closed fracture of femur, neck 05/06/2015  . Nonsustained ventricular tachycardia 05/09/2015    Past Surgical History  Procedure Laterality Date  . Hemorrhoid surgery  12/07/1998  . Lung surgery  12/07/2001    BENIGN LESION ON RIGHT LUNG REMOVED  . Colonoscopy    . Femur im nail Left 01/21/2013    Procedure: INTRAMEDULLARY (IM) NAIL FEMORAL;  Surgeon: Hessie Dibble, MD;  Location: [facility] ORS;  Service: Orthopedics;  Laterality: Left;  . Esophagogastroduodenoscopy N/A 03/16/2013    Procedure: ESOPHAGOGASTRODUODENOSCOPY (EGD);  Surgeon: Cleotis Nipper, MD;  Location[facility] ENDOSCOPY;  Service: Endoscopy;  Laterality: N/A;  will  probably want to do at bedside in ICU---bed placement pending  . Loop recorder implant N/A 09/22/2013    Procedure: LOOP RECORDER IMPLANT;  Surgeon: Coralyn Mark, MD;  Location: [facility] CATH LAB;  Service: Cardiovascular;  Laterality: N/A;  . Hip pinning,cannulated Right 05/07/2015    Procedure: CANNULATED HIP PINNING;  Surgeon: Phylliss Bob, MD;  Location: [facility] ORS;  Service: Orthopedics;  Laterality: Right;    History  Smoking status  . Current Some Day Smoker -- 1.50 packs/day  . Types: Cigarettes  Smokeless tobacco  . Not on file    History  Alcohol Use  . Yes    Comment: Drinks 2-3 times a week. 2oz of liquor.     Family History  Problem Relation Age of Onset  . Hypertension Mother 88  . Hypertension Father 65  . Hypertension Sister   . Hypertension Brother   . Hypertension Sister   . Hypertension Brother   . Hypertension Brother   . Hypertension Brother   . Hypertension Brother     Review of Systems: The review of systems is per the HPI.  All other systems were reviewed and are negative.  Physical Exam: BP 190/90 mmHg  Pulse 50  Ht 6' (1.829 m)  Wt 90.175 kg (198 lb 12.8 oz)  BMI 26.96 kg/m2  SpO2 96% Patient is very pleasant and in no acute distress.  Skin is warm and dry. Color is normal.   HEENT is unremarkable but very poor dentition, lots of missing teeth, loose teeth, etc. Normocephalic/atraumatic. PERRL. Sclera are nonicteric. Neck is supple. No masses. No JVD.  Lungs are clear.  Cardiac exam shows an irregular rhythm. Rate is ok.   Abdomen is soft. Extremities have chronic stasis changes. . Gait and ROM are intact. Using a  Walker .  No gross neurologic deficits noted.  Wt Readings from Last 3 Encounters:  09/02/15 90.175 kg (198 lb 12.8 oz)  05/09/15 88.5 kg (195 lb 1.7 oz)  12/26/13 80.922 kg (178 lb 6.4 oz)     LABORATORY DATA: BMET and CBC pending  Lab Results  Component Value Date   WBC 10.0 05/09/2015   HGB 12.4* 05/09/2015   HCT 39.0  05/09/2015   PLT 114* 05/09/2015   GLUCOSE 126* 05/09/2015   CHOL 120 09/20/2013   TRIG 68 09/20/2013   HDL 39* 09/20/2013   LDLCALC 67 09/20/2013   ALT 17 05/06/2015   AST 17 05/06/2015   NA 135 05/09/2015   K 4.0 05/09/2015   CL 102 05/09/2015   CREATININE 1.66* 05/09/2015   BUN 48* 05/09/2015   CO2 22 05/09/2015   INR 1.09 05/06/2015   HGBA1C 5.0 09/20/2013   Echo Study Conclusions from October 2014  - Left ventricle: The cavity size was mildly dilated. Wall thickness was normal. Systolic function was severely reduced. The estimated ejection fraction was in the range of 20% to 25%. Diffuse hypokinesis. Features are consistent with a pseudonormal left ventricular filling pattern, with concomitant abnormal relaxation and increased filling pressure (grade 2 diastolic dysfunction). E/medial e' > 15 suggesting LV end diastolic pressure at least 20 mmHg. - Aortic valve: There was no stenosis. - Mitral valve: Mildly calcified annulus. Mildly calcified leaflets . Mild regurgitation. - Left atrium: The atrium was moderately dilated. - Right ventricle: The cavity size was normal. Systolic function was mildly reduced. - Right atrium: The atrium was mildly to moderately dilated. - Pulmonary arteries: No complete TR doppler jet so unable to estimate PA systolic pressure. - Systemic veins: IVC measured 2.1 cm with some respirophasic variation, suggesting RA pressure 10 mmHg    Assessment / Plan:  1. Atrial fib/flutter - he remains in atrial fib - would continue with rate control and anticoagulation. Fortunately, no more falls reported. Will check follow up labs today.   2. CVA - seems to be getting stronger  3. Substance abuse - still with ongoing tobacco .  Michela Pitcher that he is cutting back on drinking but admits to drinking during the game yesterda.    4. Chronic systolic congestive heart failure: patient previous ejection fraction of around 20%. His last ejection fraction is  around 55% by echo. His blood pressure his still elevated. He eats lots of salt. We'll add HCTZ 25 mg a day as well as potassium chloride 10 mEq a day. We'll recheck blood work in 3 weeks.  See him back in about 6 months     Nahser, Wonda Cheng, MD  09/02/2015 3:29 PM    [address],  [place], [place]  [address] Pager [phone] Phone: [phone]; Fax: [phone]   [facility]  [address] [place], [place]  [address] [phone]   Fax [phone]

## 2015-09-17 DIAGNOSIS — M7062 Trochanteric bursitis, left hip: Secondary | ICD-10-CM | POA: Diagnosis not present

## 2015-09-17 DIAGNOSIS — M7061 Trochanteric bursitis, right hip: Secondary | ICD-10-CM | POA: Diagnosis not present

## 2015-09-23 ENCOUNTER — Other Ambulatory Visit (INDEPENDENT_AMBULATORY_CARE_PROVIDER_SITE_OTHER): Payer: Medicare Other | Admitting: *Deleted

## 2015-09-23 DIAGNOSIS — I10 Essential (primary) hypertension: Secondary | ICD-10-CM | POA: Diagnosis not present

## 2015-09-23 LAB — BASIC METABOLIC PANEL
BUN: 52 mg/dL — ABNORMAL HIGH (ref 7–25)
CALCIUM: 9.8 mg/dL (ref 8.6–10.3)
CO2: 23 mmol/L (ref 20–31)
Chloride: 104 mmol/L (ref 98–110)
Creat: 2.2 mg/dL — ABNORMAL HIGH (ref 0.70–1.18)
Glucose, Bld: 98 mg/dL (ref 65–99)
POTASSIUM: 5.4 mmol/L — AB (ref 3.5–5.3)
SODIUM: 137 mmol/L (ref 135–146)

## 2015-09-23 NOTE — Addendum Note (Signed)
Addended by: Eulis Foster on: 09/23/2015 11:14 AM   Modules accepted: Orders

## 2015-09-24 ENCOUNTER — Telehealth: Payer: Self-pay | Admitting: Nurse Practitioner

## 2015-09-24 DIAGNOSIS — I10 Essential (primary) hypertension: Secondary | ICD-10-CM

## 2015-09-24 NOTE — Telephone Encounter (Signed)
Reviewed lab results and plan of care with patient who verbalized understanding and agreement to d/c HCTZ and kdur and avoid high sodium foods.  He wrote down these instructions and is aware of repeat lab appointment on 11/17.  I advised him to call back with questions or concerns prior to that time and he verbalized agreement.

## 2015-09-24 NOTE — Telephone Encounter (Signed)
-----   Message from Thayer Headings, MD sent at 09/23/2015  6:16 PM EDT ----- Creatinine .potassium and BUN are  higher.  He has been started on HCTZ and kdur for his HTN He still eats lots of salty foods.  DC kdur and HCTZ,  please advise him to avoid salty foods.  Recheck BNP in 1 months

## 2015-09-28 DIAGNOSIS — J441 Chronic obstructive pulmonary disease with (acute) exacerbation: Secondary | ICD-10-CM | POA: Diagnosis not present

## 2015-10-11 DIAGNOSIS — C61 Malignant neoplasm of prostate: Secondary | ICD-10-CM | POA: Diagnosis not present

## 2015-10-14 DIAGNOSIS — H401112 Primary open-angle glaucoma, right eye, moderate stage: Secondary | ICD-10-CM | POA: Diagnosis not present

## 2015-10-14 DIAGNOSIS — H2512 Age-related nuclear cataract, left eye: Secondary | ICD-10-CM | POA: Diagnosis not present

## 2015-10-14 DIAGNOSIS — H2513 Age-related nuclear cataract, bilateral: Secondary | ICD-10-CM | POA: Diagnosis not present

## 2015-10-14 DIAGNOSIS — H25013 Cortical age-related cataract, bilateral: Secondary | ICD-10-CM | POA: Diagnosis not present

## 2015-10-14 DIAGNOSIS — H401123 Primary open-angle glaucoma, left eye, severe stage: Secondary | ICD-10-CM | POA: Diagnosis not present

## 2015-10-18 DIAGNOSIS — C61 Malignant neoplasm of prostate: Secondary | ICD-10-CM | POA: Diagnosis not present

## 2015-10-24 ENCOUNTER — Other Ambulatory Visit (INDEPENDENT_AMBULATORY_CARE_PROVIDER_SITE_OTHER): Payer: Medicare Other | Admitting: *Deleted

## 2015-10-24 DIAGNOSIS — I10 Essential (primary) hypertension: Secondary | ICD-10-CM

## 2015-10-24 LAB — BASIC METABOLIC PANEL
BUN: 41 mg/dL — AB (ref 7–25)
CO2: 25 mmol/L (ref 20–31)
CREATININE: 1.6 mg/dL — AB (ref 0.70–1.18)
Calcium: 9.7 mg/dL (ref 8.6–10.3)
Chloride: 103 mmol/L (ref 98–110)
GLUCOSE: 101 mg/dL — AB (ref 65–99)
POTASSIUM: 4.4 mmol/L (ref 3.5–5.3)
Sodium: 138 mmol/L (ref 135–146)

## 2015-10-24 NOTE — Addendum Note (Signed)
Addended by: Eulis Foster on: 10/24/2015 09:35 AM   Modules accepted: Orders

## 2015-10-29 DIAGNOSIS — J441 Chronic obstructive pulmonary disease with (acute) exacerbation: Secondary | ICD-10-CM | POA: Diagnosis not present

## 2015-11-05 DIAGNOSIS — H2512 Age-related nuclear cataract, left eye: Secondary | ICD-10-CM | POA: Diagnosis not present

## 2015-11-21 DIAGNOSIS — H2511 Age-related nuclear cataract, right eye: Secondary | ICD-10-CM | POA: Diagnosis not present

## 2015-11-28 DIAGNOSIS — J441 Chronic obstructive pulmonary disease with (acute) exacerbation: Secondary | ICD-10-CM | POA: Diagnosis not present

## 2015-12-10 DIAGNOSIS — H2511 Age-related nuclear cataract, right eye: Secondary | ICD-10-CM | POA: Diagnosis not present

## 2015-12-20 DIAGNOSIS — I4891 Unspecified atrial fibrillation: Secondary | ICD-10-CM | POA: Diagnosis not present

## 2015-12-20 DIAGNOSIS — I10 Essential (primary) hypertension: Secondary | ICD-10-CM | POA: Diagnosis not present

## 2015-12-20 DIAGNOSIS — J449 Chronic obstructive pulmonary disease, unspecified: Secondary | ICD-10-CM | POA: Diagnosis not present

## 2015-12-20 DIAGNOSIS — K219 Gastro-esophageal reflux disease without esophagitis: Secondary | ICD-10-CM | POA: Diagnosis not present

## 2015-12-29 DIAGNOSIS — J441 Chronic obstructive pulmonary disease with (acute) exacerbation: Secondary | ICD-10-CM | POA: Diagnosis not present

## 2016-01-13 ENCOUNTER — Ambulatory Visit (INDEPENDENT_AMBULATORY_CARE_PROVIDER_SITE_OTHER): Payer: Medicare Other | Admitting: Ophthalmology

## 2016-01-29 DIAGNOSIS — J441 Chronic obstructive pulmonary disease with (acute) exacerbation: Secondary | ICD-10-CM | POA: Diagnosis not present

## 2016-02-26 DIAGNOSIS — J441 Chronic obstructive pulmonary disease with (acute) exacerbation: Secondary | ICD-10-CM | POA: Diagnosis not present

## 2016-02-27 ENCOUNTER — Encounter: Payer: Self-pay | Admitting: Cardiovascular Disease

## 2016-02-27 ENCOUNTER — Ambulatory Visit (INDEPENDENT_AMBULATORY_CARE_PROVIDER_SITE_OTHER): Payer: Medicare Other | Admitting: Cardiovascular Disease

## 2016-02-27 VITALS — BP 128/76 | HR 44 | Ht 72.0 in | Wt 209.0 lb

## 2016-02-27 DIAGNOSIS — I10 Essential (primary) hypertension: Secondary | ICD-10-CM

## 2016-02-27 DIAGNOSIS — I482 Chronic atrial fibrillation, unspecified: Secondary | ICD-10-CM

## 2016-02-27 DIAGNOSIS — E785 Hyperlipidemia, unspecified: Secondary | ICD-10-CM | POA: Diagnosis not present

## 2016-02-27 DIAGNOSIS — I5042 Chronic combined systolic (congestive) and diastolic (congestive) heart failure: Secondary | ICD-10-CM | POA: Diagnosis not present

## 2016-02-27 LAB — COMPREHENSIVE METABOLIC PANEL
ALBUMIN: 3.8 g/dL (ref 3.6–5.1)
ALT: 13 U/L (ref 9–46)
AST: 18 U/L (ref 10–35)
Alkaline Phosphatase: 72 U/L (ref 40–115)
BUN: 48 mg/dL — ABNORMAL HIGH (ref 7–25)
CALCIUM: 8.9 mg/dL (ref 8.6–10.3)
CHLORIDE: 103 mmol/L (ref 98–110)
CO2: 22 mmol/L (ref 20–31)
Creat: 1.89 mg/dL — ABNORMAL HIGH (ref 0.70–1.18)
GLUCOSE: 96 mg/dL (ref 65–99)
Potassium: 4.3 mmol/L (ref 3.5–5.3)
SODIUM: 138 mmol/L (ref 135–146)
Total Bilirubin: 0.7 mg/dL (ref 0.2–1.2)
Total Protein: 6.8 g/dL (ref 6.1–8.1)

## 2016-02-27 LAB — LIPID PANEL
CHOL/HDL RATIO: 2.6 ratio (ref <=5.0)
CHOLESTEROL: 107 mg/dL — AB (ref 125–200)
HDL: 41 mg/dL (ref >=40)
LDL Cholesterol: 43 mg/dL (ref <130)
Triglycerides: 115 mg/dL (ref <150)
VLDL: 23 mg/dL (ref <30)

## 2016-02-27 LAB — TSH: TSH: 1.44 mIU/L (ref 0.40–4.50)

## 2016-02-27 NOTE — Progress Notes (Signed)
[name] Date of Birth: 1936-03-25 Medical Record [id number]   problem list 1. Essential hypertension 2. History prostate cancer 3. Coronary artery disease  4.  Atrial fib  5. CVA 6. Hip Fracture 7. COPD  8. CKD 9.   History of Present Illness: [name] is seen back today for a one month follow up visit.  He has a history of HTN, smoking and prostate cancer. Known CAD with remote stents and kept on Plavix. His other issues include substance abuse with alcohol and tobacco, prior CVA, AS, COPD, CKD, and thrombocytopenia.   Last seen by Dr. Acie Fredrickson back in 2012. He has had a 1st degree AV block with an incomplete RBBB and left anterior fascicular block reported in the past. Last echo in 2010 with a normal EF and mild aortic sclerosis.   Admitted early in 2014 with a left hip fracture. He had slipped and fell on the ice. Had ORIF on 01/21/13. Tolerated ok. Some concern about his rhythm. He apparently had multiple PAC's on the monitor but it looks like he stayed in sinus for that entire admission. CHADS is only a 1.   I saw him back in March of 2014 for a post hospital visit. He was at [employer]. Seemed to be doing ok.   He was admitted back in October with slurred speech and left sided facial droop. MRI showed 5 mm acute infarct - not treated with TPA due to delay of arrival. He was started on Eliquis, TEE attempted but unsuccessful due to desaturation. Had documented atrial fib/flutter (very first time noted) which was presumed to be the cause of his CVA. EF of 20 to 25% - there was talk of need for a stress test - this has not been carried out. Was given antibiotics for a presumed pneumonia.   Seen a month ago - Told me that he went to [facility] for a few weeks after his stroke. Now at home. His sister comes once a week to clean and fill his pill box. He was not short of breath. Not dizzy or lightheaded. Hds had 2 falls since he has been home - no significant injury noted.  Has chronic swelling in his legs - takes Lasix if it extends up to his thighs - tells me he has not really used any Lasix since he has been home. Saw neurology and was told that things were good. Wanting to get his teeth fixed. He thought he was doing ok. Still smoking 1 pk per day and drinking 3 to 4 oz of bourbon daily. Dr. Theone Murdoch' notes reviewed - his CCB has been cut back due to low HR/BP.   I left him on his Eliquis. We did not update his myoview. His prognosis is felt to be poor - mainly due to his social situation.  Comes back today. Here alone. Says he is doing ok. Not short of breath. He will use Lasix prn if he has more swelling - will do this for about 3 days and then stop. No falls. No chest pain. No awareness of his atrial fib. Not totally convinced that he is taking his medicines right. Asking about a Part D plan. He is smoking and drinking - has "3 good bourbons with sprite" every day. Says he is not driving while drinking.   Sept. 26, 2016: Seen back for visit.  I last saw him 4 years ago.  Was seen by Cecille Rubin 1 year ago.   See her notes  above.  Has atrial fib Eats a very salty diet - lots of fast foods, canned foods.  Walks with a walker, does not get any exercise.  Has slowed his ETOH intake   February 27, 2016  Mr. Steel is seen back for follow up visit.  Overall condition has deteriated.  Walks with a cane - very slow gait  No CP , no dyspnea.  Still smoking - 1 ppd Some ETOH - 1-2 every afternoon   No dizziness or syncope    Current Outpatient Prescriptions  Medication Sig Dispense Refill  . acetaminophen (TYLENOL) 325 MG tablet Take 2 tablets (650 mg total) by mouth every 6 (six) hours as needed for mild pain (or Fever >/= 101).    Marland Kitchen albuterol (PROVENTIL HFA;VENTOLIN HFA) 108 (90 BASE) MCG/ACT inhaler Inhale 1 puff into the lungs every 6 (six) hours as needed for wheezing or shortness of breath.    Marland Kitchen apixaban (ELIQUIS) 2.5 MG TABS tablet Take 1 tablet (2.5 mg total) by  mouth 2 (two) times daily. 60 tablet 0  . atorvastatin (LIPITOR) 40 MG tablet Take 40 mg by mouth daily.    . bicalutamide (CASODEX) 50 MG tablet Take 50 mg by mouth daily.    . bisacodyl (DULCOLAX) 5 MG EC tablet Take 1 tablet (5 mg total) by mouth daily as needed for moderate constipation. 30 tablet 0  . carvedilol (COREG) 3.125 MG tablet Take 1 tablet (3.125 mg total) by mouth 2 (two) times daily with a meal. 60 tablet 4  . ferrous sulfate 325 (65 FE) MG tablet Take 1 tablet (325 mg total) by mouth daily with breakfast.  3  . folic acid (FOLVITE) 1 MG tablet Take 1 tablet (1 mg total) by mouth daily.    . hydrALAZINE (APRESOLINE) 10 MG tablet Take 1 tablet (10 mg total) by mouth every 8 (eight) hours.    Marland Kitchen HYDROcodone-acetaminophen (NORCO/VICODIN) 5-325 MG per tablet Take 1-2 tablets by mouth every 6 (six) hours as needed for moderate pain. 30 tablet 0  . latanoprost (XALATAN) 0.005 % ophthalmic solution Place 1 drop into both eyes at bedtime.    Marland Kitchen losartan (COZAAR) 100 MG tablet Take 100 mg by mouth daily.    . magnesium gluconate (MAGONATE) 500 MG tablet Take 500 mg by mouth daily.    . methocarbamol (ROBAXIN) 500 MG tablet Take 1 tablet (500 mg total) by mouth every 6 (six) hours as needed for muscle spasms.    . Multiple Vitamins-Minerals (PRESERVISION AREDS PO) Take 1 tablet by mouth 2 (two) times daily.    . nicotine (NICODERM CQ - DOSED IN MG/24 HOURS) 21 mg/24hr patch Place 1 patch (21 mg total) onto the skin daily. 28 patch 0  . omega-3 acid ethyl esters (LOVAZA) 1 G capsule Take 1 g by mouth daily.     . pantoprazole (PROTONIX) 40 MG tablet Take 40 mg by mouth daily.    . polyethylene glycol (MIRALAX / GLYCOLAX) packet Take 17 g by mouth daily. 14 each 0  . thiamine 100 MG tablet Take 100 mg by mouth daily.     No current facility-administered medications for this visit.    Allergies  Allergen Reactions  . Plavix [Clopidogrel Bisulfate] Other (See Comments)    Unknown reaction      Past Medical History  Diagnosis Date  . Hypertension   . Edema of leg   . COPD (chronic obstructive pulmonary disease) ([place])   . Choledocholithiasis   . Coronary arteriosclerosis   .  Hyperlipidemia   . Ichthyosis   . Mitral regurgitation   . Onychomycosis   . Vitamin D deficiency   . Aortic stenosis   . Hyperkalemia   . Hypocalcemia   . Lymphadenopathy   . Prostate cancer ([place])   . Status post lung surgery 12/07/2001    BENIGHN LESION ON RIGHT LUNG REMOVED  . Ascites     HISTORY  . History of colonic diverticulitis   . Olecranon bursitis   . GI bleed     a. 03/2013 a/w hemorrhagic shock - EGD on 03/1013, which revealed active bleeding from proximal duodenum, with complete hemostasis following epinephrine injection therapy. At least 2 duodenal ulcers identified.  . Alcohol abuse 01/21/2013  . Chronic combined systolic and diastolic congestive heart failure ([place]) 05/06/2015  . UNSPECIFIED PERIPHERAL VASCULAR DISEASE 02/18/2011    Qualifier: Diagnosis of  By: Terald Sleeper    . Hip fracture, left ([place]) 01/21/2013  . Hemorrhagic shock 03/16/2013  . GIB (gastrointestinal bleeding) 03/16/2013  . CAP (community acquired pneumonia) 09/19/2013  . CVA (cerebral infarction) 09/19/2013  . Atrial fibrillation ([place]) 09/23/2013  . Secondary cardiomyopathy, unspecified 09/23/2013  . Atrial flutter ([place]) 09/25/2013  . Conduction disorder of the heart 09/25/2013  . Thrombocytopenia ([place]) 09/25/2013  . GERD (gastroesophageal reflux disease) 10/11/2013  . CAD (coronary artery disease) 10/11/2013  . Closed fracture of femur, neck ([place]) 05/06/2015  . Nonsustained ventricular tachycardia ([place]) 05/09/2015    Past Surgical History  Procedure Laterality Date  . Hemorrhoid surgery  12/07/1998  . Lung surgery  12/07/2001    BENIGN LESION ON RIGHT LUNG REMOVED  . Colonoscopy    . Femur im nail Left 01/21/2013    Procedure: INTRAMEDULLARY (IM) NAIL FEMORAL;  Surgeon: Hessie Dibble, MD;  Location: [facility]  ORS;  Service: Orthopedics;  Laterality: Left;  . Esophagogastroduodenoscopy N/A 03/16/2013    Procedure: ESOPHAGOGASTRODUODENOSCOPY (EGD);  Surgeon: Cleotis Nipper, MD;  Location: [facility] ENDOSCOPY;  Service: Endoscopy;  Laterality: N/A;  will probably want to do at bedside in ICU---bed placement pending  . Loop recorder implant N/A 09/22/2013    Procedure: LOOP RECORDER IMPLANT;  Surgeon: Coralyn Mark, MD;  Location: [facility] CATH LAB;  Service: Cardiovascular;  Laterality: N/A;  . Hip pinning,cannulated Right 05/07/2015    Procedure: CANNULATED HIP PINNING;  Surgeon: Phylliss Bob, MD;  Location: [facility] ORS;  Service: Orthopedics;  Laterality: Right;    History  Smoking status  . Current Some Day Smoker -- 1.50 packs/day  . Types: Cigarettes  Smokeless tobacco  . Not on file    History  Alcohol Use  . Yes    Comment: Drinks 2-3 times a week. 2oz of liquor.     Family History  Problem Relation Age of Onset  . Hypertension Mother 37  . Hypertension Father 32  . Hypertension Sister   . Hypertension Brother   . Hypertension Sister   . Hypertension Brother   . Hypertension Brother   . Hypertension Brother   . Hypertension Brother     Review of Systems: The review of systems is per the HPI.  All other systems were reviewed and are negative.  Physical Exam: BP 128/76 mmHg  Pulse 44  Ht 6' (1.829 m)  Wt 209 lb (94.802 kg)  BMI 28.34 kg/m2  SpO2 99% Patient is very pleasant and in no acute distress.  Skin is warm and dry. Color is normal.   HEENT is unremarkable but very poor dentition, lots of missing teeth, loose  teeth, etc. Normocephalic/atraumatic. PERRL. Sclera are nonicteric. Neck is supple. No masses. No JVD.  Lungs are clear.  Cardiac exam shows an irregular rhythm. Rate is ok.   Abdomen is soft. Extremities have chronic stasis changes. . Gait and ROM are intact. Using a  Walker .  No gross neurologic deficits noted.  Wt Readings from Last 3 Encounters:  02/27/16 209 lb  (94.802 kg)  09/02/15 198 lb 12.8 oz (90.175 kg)  05/09/15 195 lb 1.7 oz (88.5 kg)     LABORATORY DATA: BMET and CBC pending  Lab Results  Component Value Date   WBC 10.0 05/09/2015   HGB 12.4* 05/09/2015   HCT 39.0 05/09/2015   PLT 114* 05/09/2015   GLUCOSE 101* 10/24/2015   CHOL 120 09/20/2013   TRIG 68 09/20/2013   HDL 39* 09/20/2013   LDLCALC 67 09/20/2013   ALT 17 05/06/2015   AST 17 05/06/2015   NA 138 10/24/2015   K 4.4 10/24/2015   CL 103 10/24/2015   CREATININE 1.60* 10/24/2015   BUN 41* 10/24/2015   CO2 25 10/24/2015   INR 1.09 05/06/2015   HGBA1C 5.0 09/20/2013   Echo Study Conclusions from October 2014  - Left ventricle: The cavity size was mildly dilated. Wall thickness was normal. Systolic function was severely reduced. The estimated ejection fraction was in the range of 20% to 25%. Diffuse hypokinesis. Features are consistent with a pseudonormal left ventricular filling pattern, with concomitant abnormal relaxation and increased filling pressure (grade 2 diastolic dysfunction). E/medial e' > 15 suggesting LV end diastolic pressure at least 20 mmHg. - Aortic valve: There was no stenosis. - Mitral valve: Mildly calcified annulus. Mildly calcified leaflets . Mild regurgitation. - Left atrium: The atrium was moderately dilated. - Right ventricle: The cavity size was normal. Systolic function was mildly reduced. - Right atrium: The atrium was mildly to moderately dilated. - Pulmonary arteries: No complete TR doppler jet so unable to estimate PA systolic pressure. - Systemic veins: IVC measured 2.1 cm with some respirophasic variation, suggesting RA pressure 10 mmHg   ECG ordered by me February 27, 2016:  Atrial fib with rate of 44.  Inc. RBBB, previous ant. MI   Assessment / Plan:  1. Atrial fib/flutter - he remains in atrial fib -  HR is slow . Will DC the coreg.  No symptoms of syncope or presyncope  2. CVA - seems to be getting stronger  3.  Substance abuse - still with ongoing tobacco .  Said that he is cutting back on drinking     4. Chronic systolic congestive heart failure: patient previous ejection fraction of around 20%. His last ejection fraction is around 55% by echo. His blood pressure his still elevated. He eats lots of salt.  Advised him to decrease his salt intake   See him back in about 6 months     Safira Proffit, Wonda Cheng, MD  02/27/2016 8:50 AM    [address],  [place], [place]  [address] Pager [phone] Phone: [phone]; Fax: [phone]   [facility]  [address] [place], [place]  [address] [phone]   Fax [phone]

## 2016-02-27 NOTE — Patient Instructions (Signed)
Medication Instructions:  STOP Coreg (Carvedilol)   Labwork: TODAY - cholesterol, complete metabolic panel, TSH   Testing/Procedures: None Ordered   Follow-Up: Your physician wants you to follow-up in: 6 months with Dr. Acie Fredrickson.  You will receive a reminder letter in the mail two months in advance. If you don't receive a letter, please call our office to schedule the follow-up appointment.   If you need a refill on your cardiac medications before your next appointment, please call your pharmacy.   Thank you for choosing [facility]! Christen Bame, RN [phone]

## 2016-03-02 ENCOUNTER — Telehealth: Payer: Self-pay | Admitting: Cardiovascular Disease

## 2016-03-02 NOTE — Telephone Encounter (Signed)
New message   Pt returning call for Rn  About results from last week

## 2016-03-02 NOTE — Telephone Encounter (Signed)
Lab results reviewed with patient who verbalized understanding and gratitude for the call

## 2016-03-24 DIAGNOSIS — Z125 Encounter for screening for malignant neoplasm of prostate: Secondary | ICD-10-CM | POA: Diagnosis not present

## 2016-03-24 DIAGNOSIS — Z0000 Encounter for general adult medical examination without abnormal findings: Secondary | ICD-10-CM | POA: Diagnosis not present

## 2016-03-24 DIAGNOSIS — E782 Mixed hyperlipidemia: Secondary | ICD-10-CM | POA: Diagnosis not present

## 2016-03-26 DIAGNOSIS — Z23 Encounter for immunization: Secondary | ICD-10-CM | POA: Diagnosis not present

## 2016-03-26 DIAGNOSIS — Z0000 Encounter for general adult medical examination without abnormal findings: Secondary | ICD-10-CM | POA: Diagnosis not present

## 2016-03-28 DIAGNOSIS — J441 Chronic obstructive pulmonary disease with (acute) exacerbation: Secondary | ICD-10-CM | POA: Diagnosis not present

## 2016-04-10 DIAGNOSIS — C61 Malignant neoplasm of prostate: Secondary | ICD-10-CM | POA: Diagnosis not present

## 2016-04-17 DIAGNOSIS — C61 Malignant neoplasm of prostate: Secondary | ICD-10-CM | POA: Diagnosis not present

## 2016-04-17 DIAGNOSIS — Z0000 Encounter for general adult medical examination without abnormal findings: Secondary | ICD-10-CM | POA: Diagnosis not present

## 2016-07-09 DIAGNOSIS — H401112 Primary open-angle glaucoma, right eye, moderate stage: Secondary | ICD-10-CM | POA: Diagnosis not present

## 2016-07-09 DIAGNOSIS — Z961 Presence of intraocular lens: Secondary | ICD-10-CM | POA: Diagnosis not present

## 2016-07-09 DIAGNOSIS — H353112 Nonexudative age-related macular degeneration, right eye, intermediate dry stage: Secondary | ICD-10-CM | POA: Diagnosis not present

## 2016-07-09 DIAGNOSIS — H401123 Primary open-angle glaucoma, left eye, severe stage: Secondary | ICD-10-CM | POA: Diagnosis not present

## 2016-07-15 ENCOUNTER — Encounter (INDEPENDENT_AMBULATORY_CARE_PROVIDER_SITE_OTHER): Payer: Medicare Other | Admitting: Ophthalmology

## 2016-07-15 DIAGNOSIS — H43813 Vitreous degeneration, bilateral: Secondary | ICD-10-CM

## 2016-07-15 DIAGNOSIS — H353124 Nonexudative age-related macular degeneration, left eye, advanced atrophic with subfoveal involvement: Secondary | ICD-10-CM | POA: Diagnosis not present

## 2016-07-15 DIAGNOSIS — H353211 Exudative age-related macular degeneration, right eye, with active choroidal neovascularization: Secondary | ICD-10-CM

## 2016-07-15 DIAGNOSIS — I10 Essential (primary) hypertension: Secondary | ICD-10-CM

## 2016-07-15 DIAGNOSIS — H35033 Hypertensive retinopathy, bilateral: Secondary | ICD-10-CM

## 2016-07-15 DIAGNOSIS — D3132 Benign neoplasm of left choroid: Secondary | ICD-10-CM

## 2016-08-13 ENCOUNTER — Encounter (INDEPENDENT_AMBULATORY_CARE_PROVIDER_SITE_OTHER): Payer: Medicare Other | Admitting: Ophthalmology

## 2016-08-13 DIAGNOSIS — I10 Essential (primary) hypertension: Secondary | ICD-10-CM

## 2016-08-13 DIAGNOSIS — H353211 Exudative age-related macular degeneration, right eye, with active choroidal neovascularization: Secondary | ICD-10-CM | POA: Diagnosis not present

## 2016-08-13 DIAGNOSIS — H43813 Vitreous degeneration, bilateral: Secondary | ICD-10-CM

## 2016-08-13 DIAGNOSIS — D3132 Benign neoplasm of left choroid: Secondary | ICD-10-CM | POA: Diagnosis not present

## 2016-08-13 DIAGNOSIS — H35033 Hypertensive retinopathy, bilateral: Secondary | ICD-10-CM

## 2016-08-13 DIAGNOSIS — H353124 Nonexudative age-related macular degeneration, left eye, advanced atrophic with subfoveal involvement: Secondary | ICD-10-CM

## 2016-08-25 DIAGNOSIS — I10 Essential (primary) hypertension: Secondary | ICD-10-CM | POA: Diagnosis not present

## 2016-08-25 DIAGNOSIS — I4891 Unspecified atrial fibrillation: Secondary | ICD-10-CM | POA: Diagnosis not present

## 2016-08-25 DIAGNOSIS — Z23 Encounter for immunization: Secondary | ICD-10-CM | POA: Diagnosis not present

## 2016-08-25 DIAGNOSIS — K219 Gastro-esophageal reflux disease without esophagitis: Secondary | ICD-10-CM | POA: Diagnosis not present

## 2016-08-25 DIAGNOSIS — Z79899 Other long term (current) drug therapy: Secondary | ICD-10-CM | POA: Diagnosis not present

## 2016-08-25 DIAGNOSIS — I509 Heart failure, unspecified: Secondary | ICD-10-CM | POA: Diagnosis not present

## 2016-08-25 DIAGNOSIS — J449 Chronic obstructive pulmonary disease, unspecified: Secondary | ICD-10-CM | POA: Diagnosis not present

## 2016-09-10 ENCOUNTER — Encounter (INDEPENDENT_AMBULATORY_CARE_PROVIDER_SITE_OTHER): Payer: Medicare Other | Admitting: Ophthalmology

## 2016-09-10 DIAGNOSIS — H353211 Exudative age-related macular degeneration, right eye, with active choroidal neovascularization: Secondary | ICD-10-CM | POA: Diagnosis not present

## 2016-09-10 DIAGNOSIS — H353124 Nonexudative age-related macular degeneration, left eye, advanced atrophic with subfoveal involvement: Secondary | ICD-10-CM | POA: Diagnosis not present

## 2016-09-10 DIAGNOSIS — H35033 Hypertensive retinopathy, bilateral: Secondary | ICD-10-CM | POA: Diagnosis not present

## 2016-09-10 DIAGNOSIS — H43813 Vitreous degeneration, bilateral: Secondary | ICD-10-CM | POA: Diagnosis not present

## 2016-09-10 DIAGNOSIS — I10 Essential (primary) hypertension: Secondary | ICD-10-CM

## 2016-09-24 DIAGNOSIS — H401123 Primary open-angle glaucoma, left eye, severe stage: Secondary | ICD-10-CM | POA: Diagnosis not present

## 2016-09-24 DIAGNOSIS — H401112 Primary open-angle glaucoma, right eye, moderate stage: Secondary | ICD-10-CM | POA: Diagnosis not present

## 2016-10-08 ENCOUNTER — Encounter (INDEPENDENT_AMBULATORY_CARE_PROVIDER_SITE_OTHER): Payer: Medicare Other | Admitting: Ophthalmology

## 2016-10-08 DIAGNOSIS — I10 Essential (primary) hypertension: Secondary | ICD-10-CM

## 2016-10-08 DIAGNOSIS — H43813 Vitreous degeneration, bilateral: Secondary | ICD-10-CM | POA: Diagnosis not present

## 2016-10-08 DIAGNOSIS — H353122 Nonexudative age-related macular degeneration, left eye, intermediate dry stage: Secondary | ICD-10-CM

## 2016-10-08 DIAGNOSIS — H353211 Exudative age-related macular degeneration, right eye, with active choroidal neovascularization: Secondary | ICD-10-CM | POA: Diagnosis not present

## 2016-10-08 DIAGNOSIS — H35033 Hypertensive retinopathy, bilateral: Secondary | ICD-10-CM

## 2016-10-08 DIAGNOSIS — D3132 Benign neoplasm of left choroid: Secondary | ICD-10-CM | POA: Diagnosis not present

## 2016-10-16 DIAGNOSIS — C61 Malignant neoplasm of prostate: Secondary | ICD-10-CM | POA: Diagnosis not present

## 2016-10-23 ENCOUNTER — Other Ambulatory Visit: Payer: Self-pay | Admitting: Urology

## 2016-10-23 DIAGNOSIS — C61 Malignant neoplasm of prostate: Secondary | ICD-10-CM

## 2016-10-23 DIAGNOSIS — Z79899 Other long term (current) drug therapy: Secondary | ICD-10-CM

## 2016-11-05 ENCOUNTER — Encounter (INDEPENDENT_AMBULATORY_CARE_PROVIDER_SITE_OTHER): Payer: Medicare Other | Admitting: Ophthalmology

## 2016-11-05 DIAGNOSIS — H43813 Vitreous degeneration, bilateral: Secondary | ICD-10-CM | POA: Diagnosis not present

## 2016-11-05 DIAGNOSIS — H353211 Exudative age-related macular degeneration, right eye, with active choroidal neovascularization: Secondary | ICD-10-CM

## 2016-11-05 DIAGNOSIS — I10 Essential (primary) hypertension: Secondary | ICD-10-CM

## 2016-11-05 DIAGNOSIS — D3132 Benign neoplasm of left choroid: Secondary | ICD-10-CM | POA: Diagnosis not present

## 2016-11-05 DIAGNOSIS — H35033 Hypertensive retinopathy, bilateral: Secondary | ICD-10-CM | POA: Diagnosis not present

## 2016-11-05 DIAGNOSIS — H353124 Nonexudative age-related macular degeneration, left eye, advanced atrophic with subfoveal involvement: Secondary | ICD-10-CM

## 2016-11-09 ENCOUNTER — Ambulatory Visit
Admission: RE | Admit: 2016-11-09 | Discharge: 2016-11-09 | Disposition: A | Discharge status: Home / Self Care | Payer: Medicare Other | Source: Ambulatory Visit | Attending: Urology | Admitting: Urology

## 2016-11-09 DIAGNOSIS — Z79899 Other long term (current) drug therapy: Secondary | ICD-10-CM

## 2016-11-09 DIAGNOSIS — C61 Malignant neoplasm of prostate: Secondary | ICD-10-CM

## 2016-11-09 DIAGNOSIS — M85832 Other specified disorders of bone density and structure, left forearm: Secondary | ICD-10-CM | POA: Diagnosis not present

## 2016-12-10 ENCOUNTER — Encounter (INDEPENDENT_AMBULATORY_CARE_PROVIDER_SITE_OTHER): Payer: Medicare Other | Admitting: Ophthalmology

## 2016-12-10 DIAGNOSIS — D3132 Benign neoplasm of left choroid: Secondary | ICD-10-CM | POA: Diagnosis not present

## 2016-12-10 DIAGNOSIS — H353211 Exudative age-related macular degeneration, right eye, with active choroidal neovascularization: Secondary | ICD-10-CM

## 2016-12-10 DIAGNOSIS — I10 Essential (primary) hypertension: Secondary | ICD-10-CM

## 2016-12-10 DIAGNOSIS — H35033 Hypertensive retinopathy, bilateral: Secondary | ICD-10-CM | POA: Diagnosis not present

## 2016-12-10 DIAGNOSIS — H353124 Nonexudative age-related macular degeneration, left eye, advanced atrophic with subfoveal involvement: Secondary | ICD-10-CM | POA: Diagnosis not present

## 2016-12-29 DIAGNOSIS — I10 Essential (primary) hypertension: Secondary | ICD-10-CM | POA: Diagnosis not present

## 2016-12-29 DIAGNOSIS — N289 Disorder of kidney and ureter, unspecified: Secondary | ICD-10-CM | POA: Diagnosis not present

## 2016-12-31 DIAGNOSIS — E782 Mixed hyperlipidemia: Secondary | ICD-10-CM | POA: Diagnosis not present

## 2016-12-31 DIAGNOSIS — J449 Chronic obstructive pulmonary disease, unspecified: Secondary | ICD-10-CM | POA: Diagnosis not present

## 2016-12-31 DIAGNOSIS — I10 Essential (primary) hypertension: Secondary | ICD-10-CM | POA: Diagnosis not present

## 2017-01-13 IMAGING — CR DG HIP (WITH OR WITHOUT PELVIS) 2-3V*R*
3 series · 3 of 3 positions shown · non-contrast
Comparison: None.

CLINICAL DATA: Acute onset of right hip pain, status post fall.
Initial encounter.

EXAM:
RIGHT HIP (WITH PELVIS) 2-3 VIEWS

[t pelvis ap]
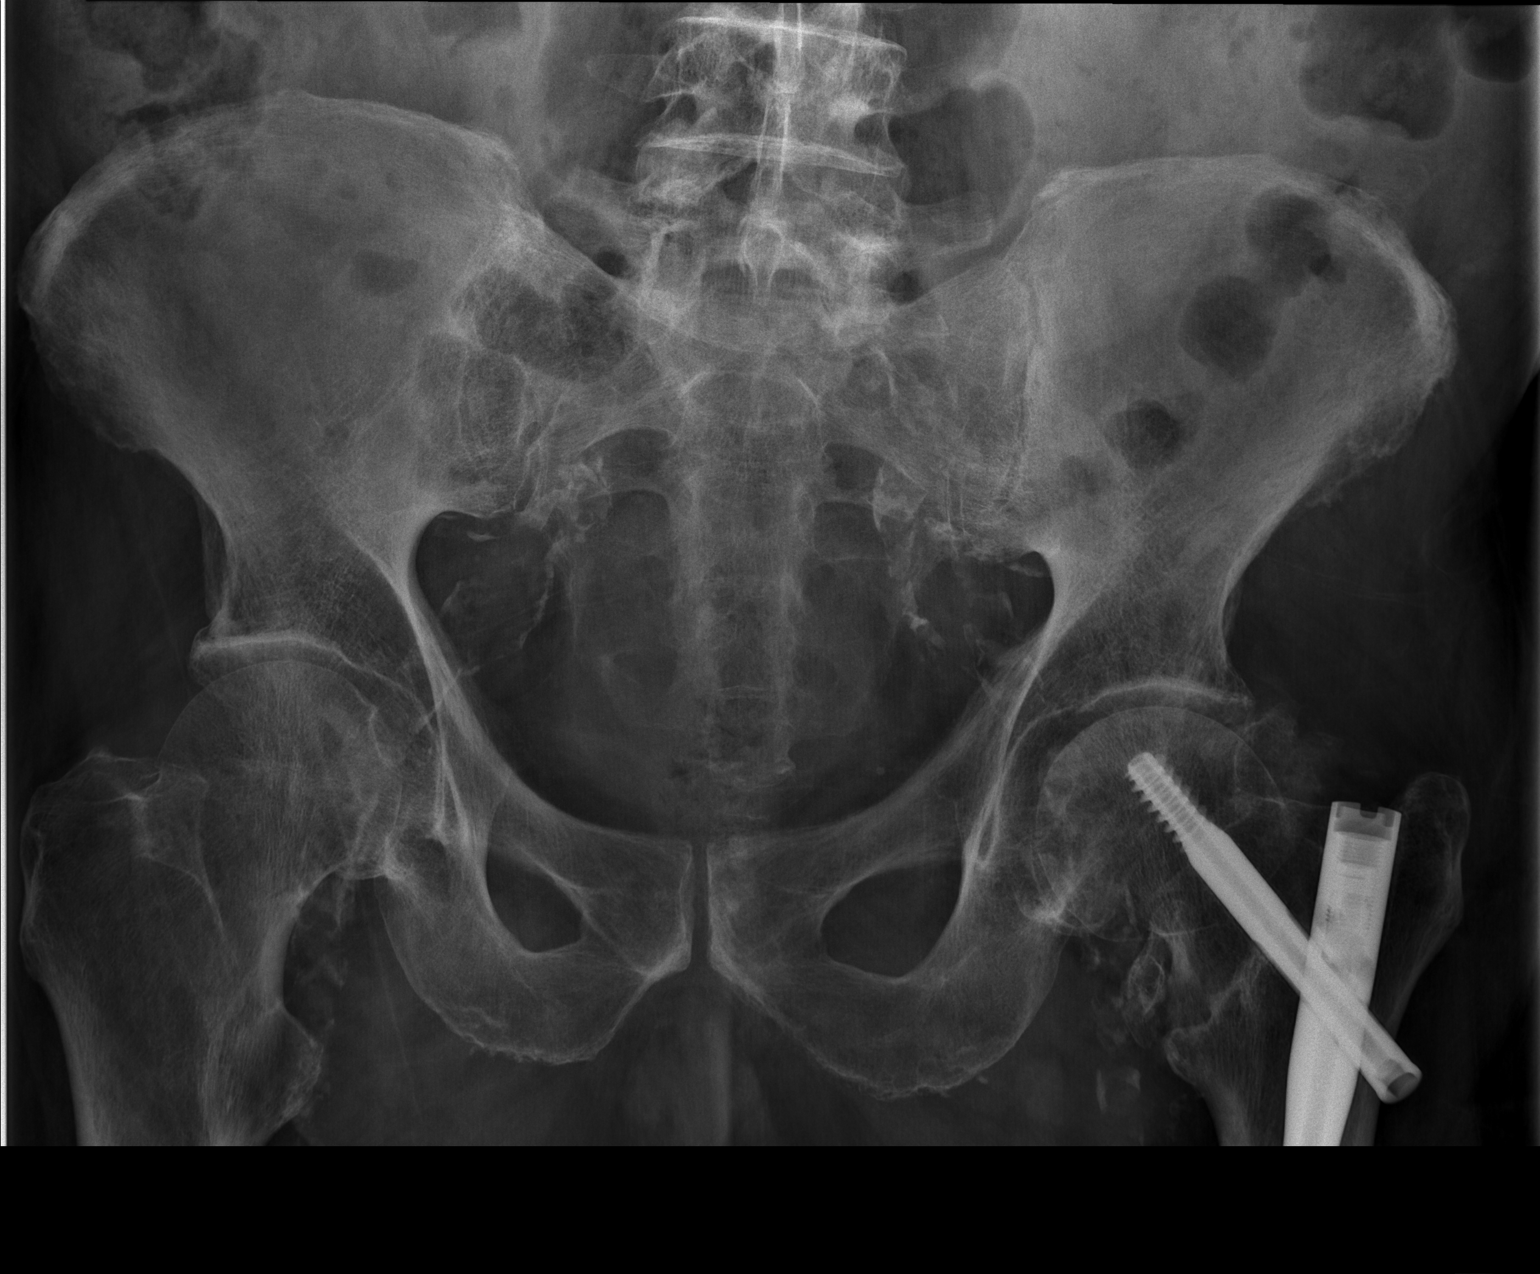

[t hip ap right]
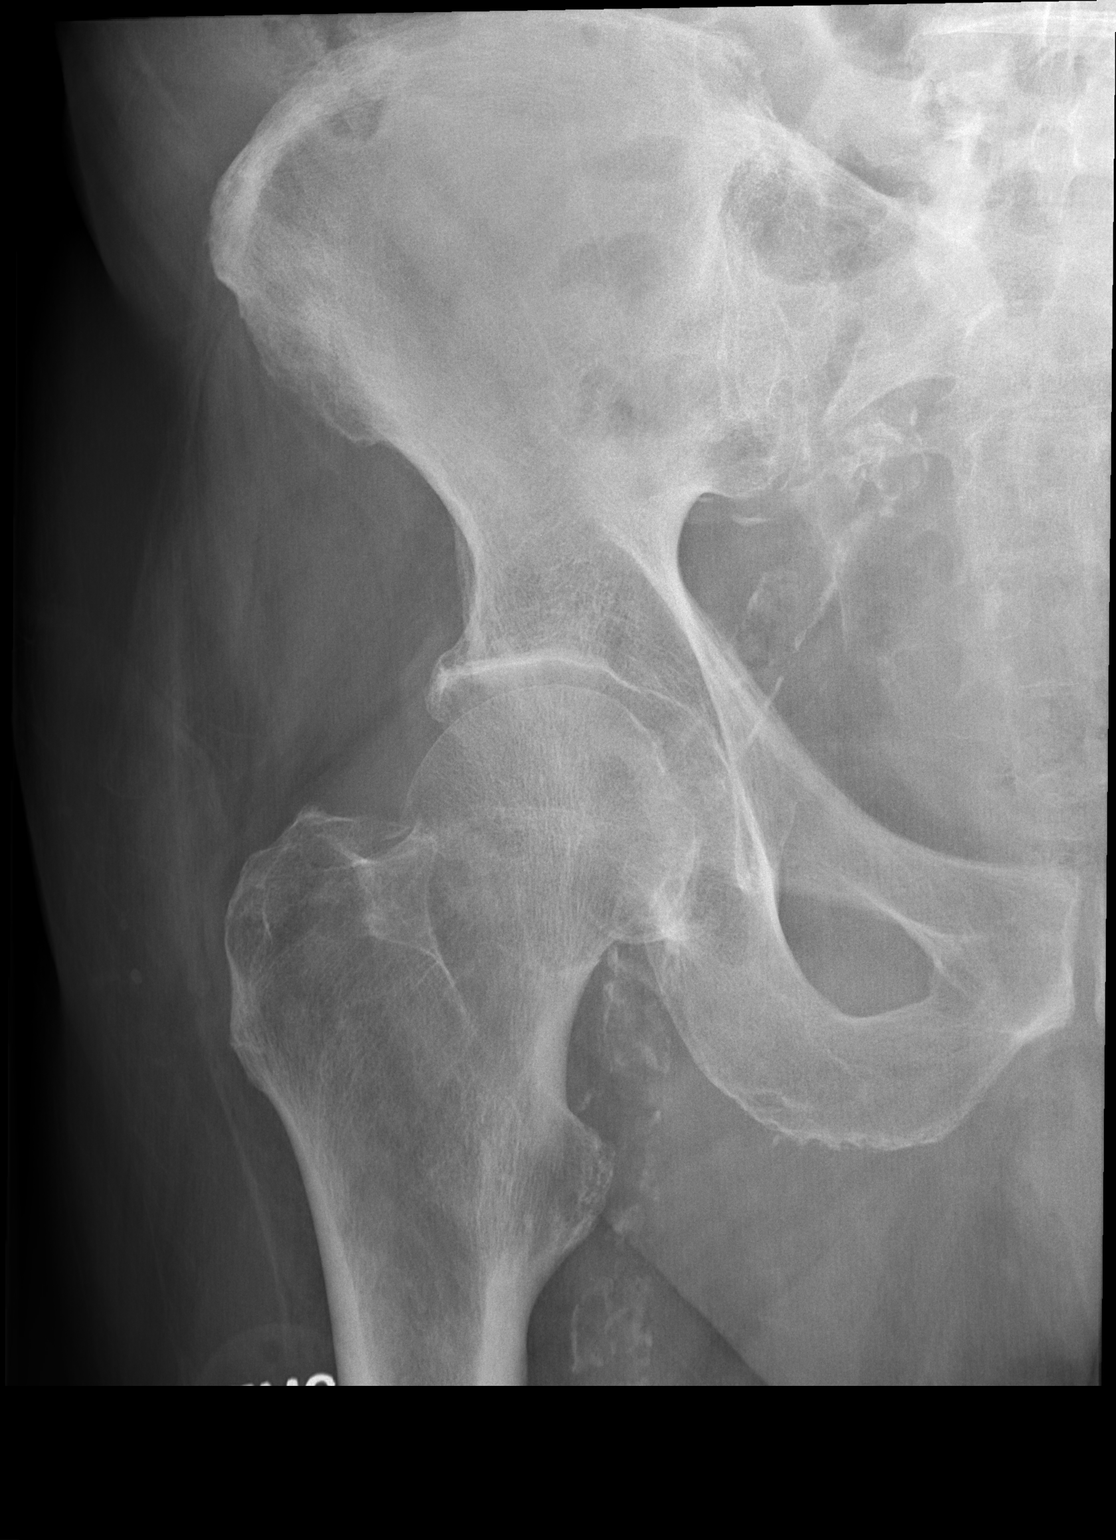

[w hip lat right]
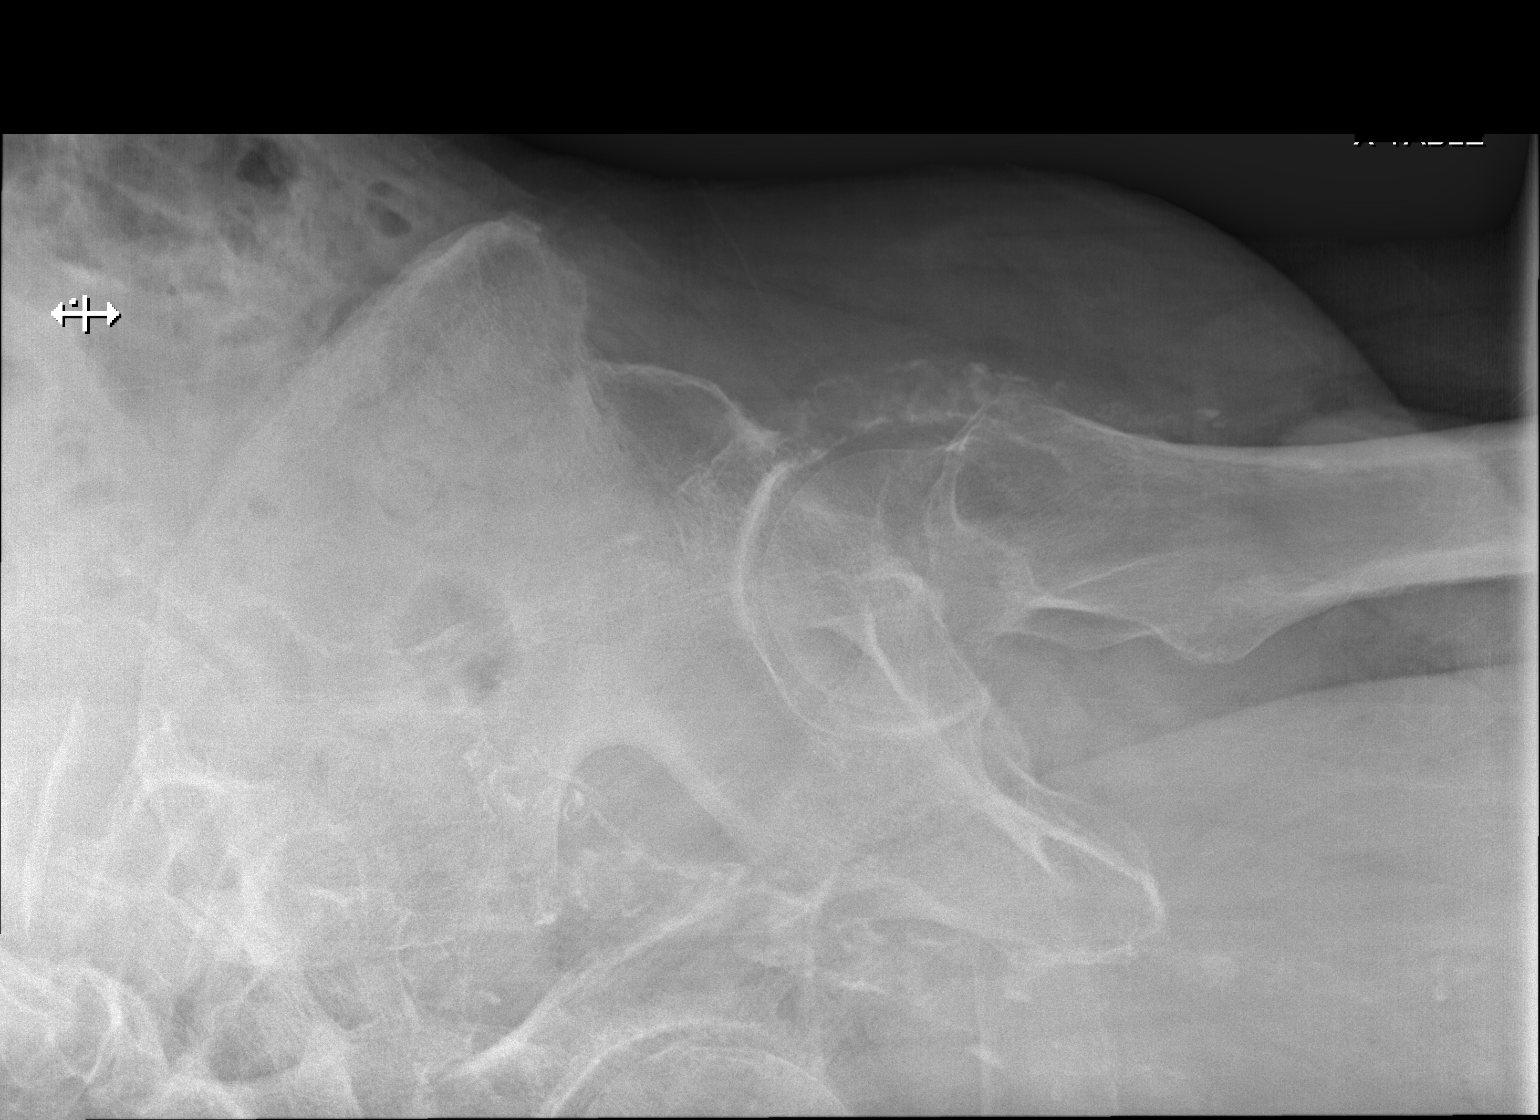

[3 of 3 positions shown; findings below may reference images not displayed]

FINDINGS: There is suspicion of a minimally displaced transcervical fracture
through the right femoral neck. No additional fractures are seen.
The right femoral head remains seated at the acetabulum.

The patient's left femoral hardware is grossly unremarkable in
appearance. The left hip joint remains intact. Mild sclerotic change
is noted at the sacroiliac joints. Diffuse vascular calcifications
are seen. The visualized bowel gas pattern is grossly unremarkable.
IMPRESSION: Suspect minimally displaced transcervical fracture through the right
femoral neck.

## 2017-01-14 ENCOUNTER — Encounter (INDEPENDENT_AMBULATORY_CARE_PROVIDER_SITE_OTHER): Payer: Medicare Other | Admitting: Ophthalmology

## 2017-01-14 DIAGNOSIS — H353122 Nonexudative age-related macular degeneration, left eye, intermediate dry stage: Secondary | ICD-10-CM | POA: Diagnosis not present

## 2017-01-14 DIAGNOSIS — D3132 Benign neoplasm of left choroid: Secondary | ICD-10-CM | POA: Diagnosis not present

## 2017-01-14 DIAGNOSIS — H353211 Exudative age-related macular degeneration, right eye, with active choroidal neovascularization: Secondary | ICD-10-CM | POA: Diagnosis not present

## 2017-01-14 DIAGNOSIS — H43813 Vitreous degeneration, bilateral: Secondary | ICD-10-CM

## 2017-01-14 DIAGNOSIS — H35033 Hypertensive retinopathy, bilateral: Secondary | ICD-10-CM

## 2017-01-14 DIAGNOSIS — I10 Essential (primary) hypertension: Secondary | ICD-10-CM | POA: Diagnosis not present

## 2017-01-14 IMAGING — RF DG HIP (WITH PELVIS) OPERATIVE*R*
1 series · 3 of 3 positions shown · non-contrast
Comparison: 05/06/2015

CLINICAL DATA: Right hip fracture fixation.

EXAM:
OPERATIVE RIGHT HIP (WITH PELVIS IF PERFORMED) 3 VIEWS
TECHNIQUE: Fluoroscopic spot image(s) were submitted for interpretation
post-operatively.
FLUOROSCOPY TIME:  Fluoroscopy Time:  2 minutes 15 seconds
Number of Acquired Images:  3

[Series 1: run · 3 of 3 slices shown]
[im 1/3]
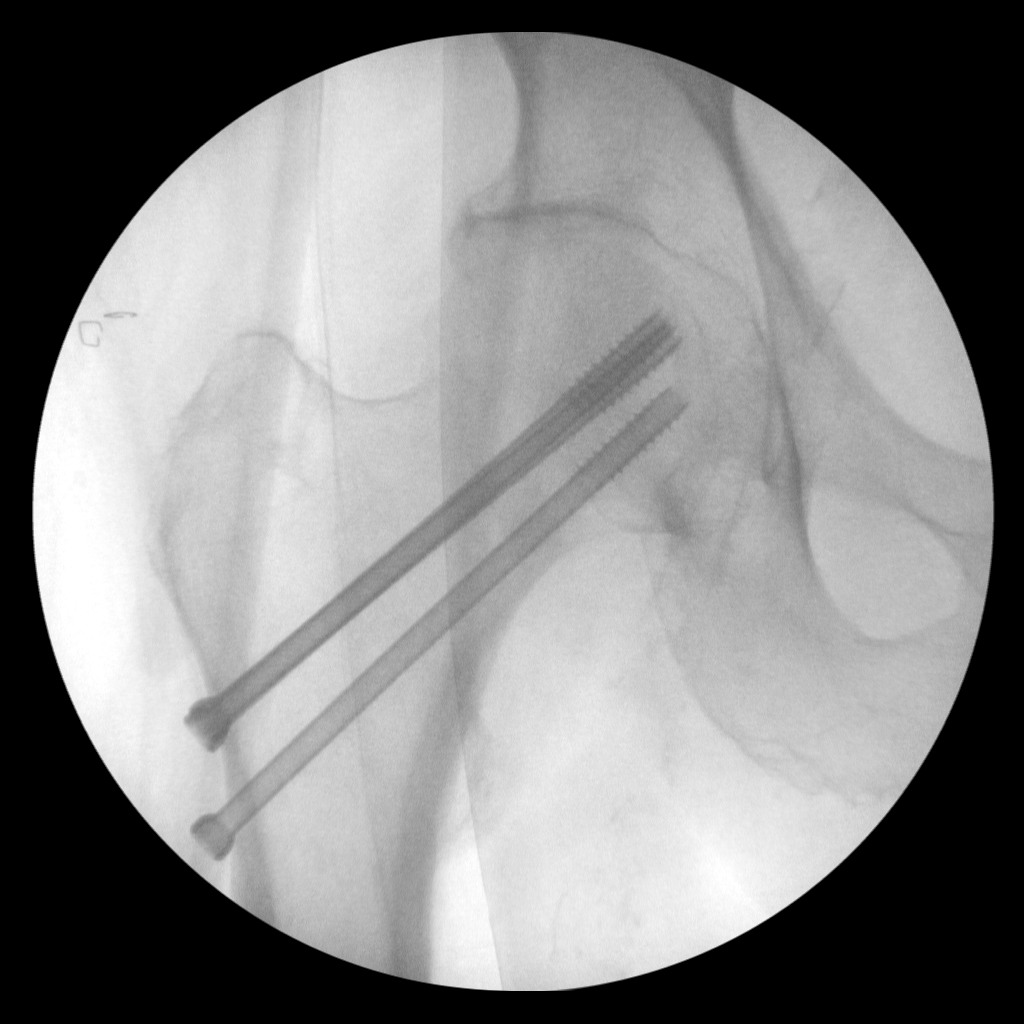
[im 2/3]
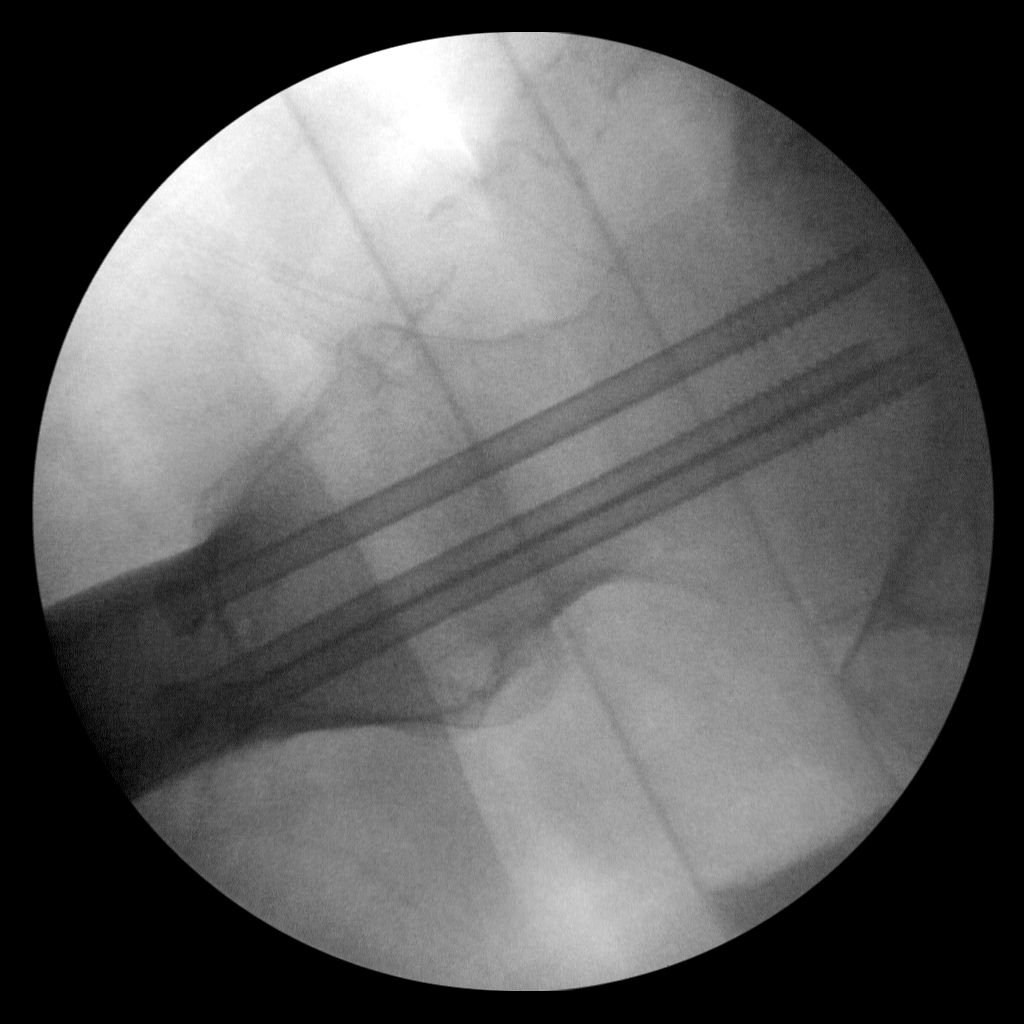
[im 3/3]
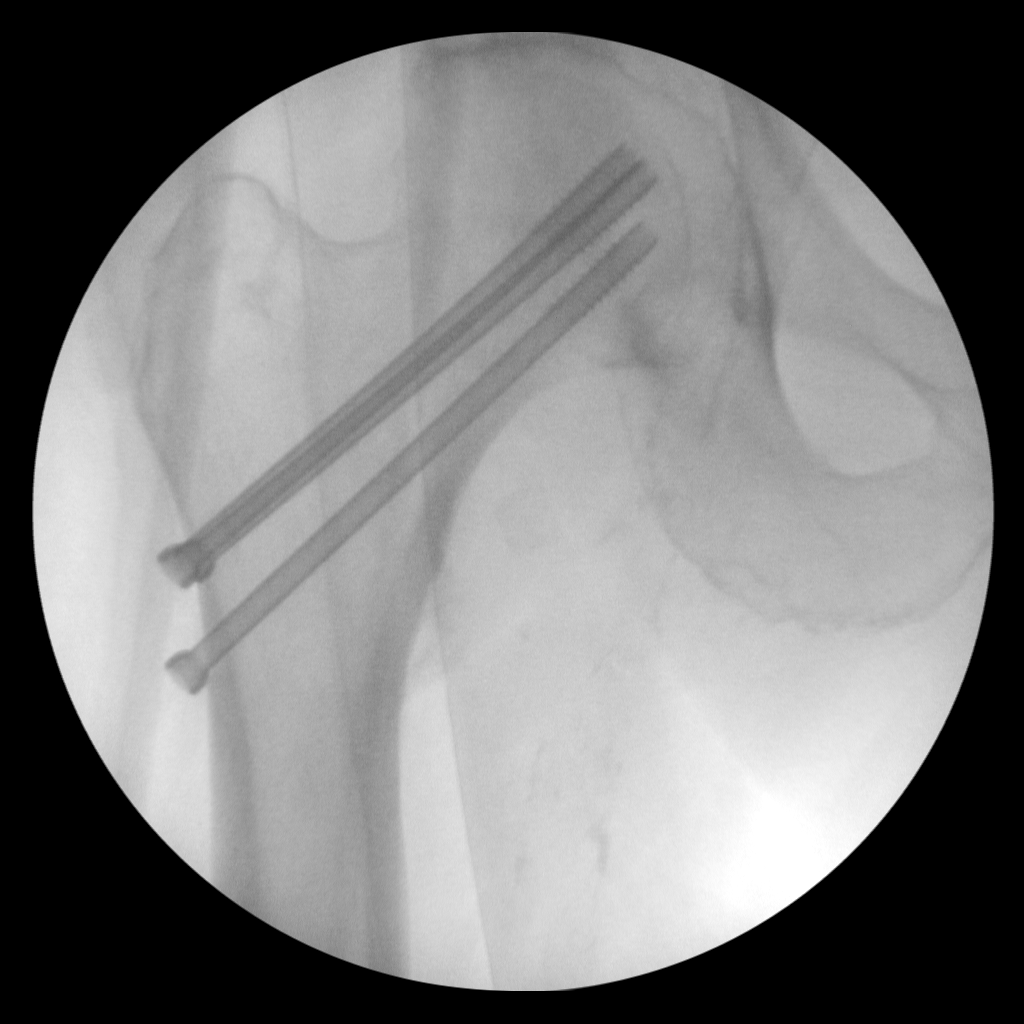

[3 of 3 positions shown; findings below may reference images not displayed]

FINDINGS: Three intraoperative views of the right hip are submitted
postoperatively for interpretation.

Three pins traverse the femoral neck fracture, in near-anatomic
alignment and position. No complicating features are identified.
IMPRESSION: Internal fixation of femoral neck fracture.

## 2017-01-19 DIAGNOSIS — Q667 Congenital pes cavus: Secondary | ICD-10-CM | POA: Diagnosis not present

## 2017-01-19 DIAGNOSIS — S81809A Unspecified open wound, unspecified lower leg, initial encounter: Secondary | ICD-10-CM | POA: Diagnosis not present

## 2017-01-19 DIAGNOSIS — I739 Peripheral vascular disease, unspecified: Secondary | ICD-10-CM | POA: Diagnosis not present

## 2017-01-19 DIAGNOSIS — L602 Onychogryphosis: Secondary | ICD-10-CM | POA: Diagnosis not present

## 2017-01-20 ENCOUNTER — Encounter: Payer: Self-pay | Admitting: Cardiovascular Disease

## 2017-01-20 ENCOUNTER — Ambulatory Visit (INDEPENDENT_AMBULATORY_CARE_PROVIDER_SITE_OTHER): Payer: Medicare Other | Admitting: Cardiovascular Disease

## 2017-01-20 VITALS — BP 146/82 | HR 66 | Ht 72.0 in | Wt 202.4 lb

## 2017-01-20 DIAGNOSIS — I739 Peripheral vascular disease, unspecified: Secondary | ICD-10-CM | POA: Insufficient documentation

## 2017-01-20 DIAGNOSIS — I4891 Unspecified atrial fibrillation: Secondary | ICD-10-CM

## 2017-01-20 NOTE — Progress Notes (Signed)
01/20/2017 Matthew Mahoney   December 04, 1936  [id number]  Primary Physician Rachell Cipro, MD Primary Cardiologist: Lorretta Harp MD Renae Gloss  HPI:  Mr. Escareno is a 81 year old moderately overweight Caucasian male patient of Dr. Cathie Olden who has history of CAD, aortic stenosis, chronic A. fib on oral anticoagulation, COPD with ongoing tobacco abuse, alcohol abuse and chronic kidney disease among other things. He saw his podiatrist, [name], who noticed a small sore on his right great toe at the metatarsal head. He denies claudication. His feet are somewhat cyanotic and cool to the touch although he does have palpable pedal pulses.   Current Outpatient Prescriptions  Medication Sig Dispense Refill  . acetaminophen (TYLENOL) 325 MG tablet Take 2 tablets (650 mg total) by mouth every 6 (six) hours as needed for mild pain (or Fever >/= 101).    Marland Kitchen albuterol (PROVENTIL HFA;VENTOLIN HFA) 108 (90 BASE) MCG/ACT inhaler Inhale 1 puff into the lungs every 6 (six) hours as needed for wheezing or shortness of breath.    Marland Kitchen apixaban (ELIQUIS) 2.5 MG TABS tablet Take 1 tablet (2.5 mg total) by mouth 2 (two) times daily. 60 tablet 0  . atorvastatin (LIPITOR) 40 MG tablet Take 40 mg by mouth daily.    . bicalutamide (CASODEX) 50 MG tablet Take 50 mg by mouth daily.    . bisacodyl (DULCOLAX) 5 MG EC tablet Take 1 tablet (5 mg total) by mouth daily as needed for moderate constipation. 30 tablet 0  . carvedilol (COREG) 3.125 MG tablet Take 1 tablet (3.125 mg total) by mouth 2 (two) times daily with a meal. 60 tablet 4  . ferrous sulfate 325 (65 FE) MG tablet Take 1 tablet (325 mg total) by mouth daily with breakfast.  3  . folic acid (FOLVITE) 1 MG tablet Take 1 tablet (1 mg total) by mouth daily.    . hydrALAZINE (APRESOLINE) 10 MG tablet Take 1 tablet (10 mg total) by mouth every 8 (eight) hours.    Marland Kitchen HYDROcodone-acetaminophen (NORCO/VICODIN) 5-325 MG per tablet Take 1-2 tablets by mouth  every 6 (six) hours as needed for moderate pain. 30 tablet 0  . latanoprost (XALATAN) 0.005 % ophthalmic solution Place 1 drop into both eyes at bedtime.    Marland Kitchen losartan (COZAAR) 100 MG tablet Take 100 mg by mouth daily.    . magnesium gluconate (MAGONATE) 500 MG tablet Take 500 mg by mouth daily.    . methocarbamol (ROBAXIN) 500 MG tablet Take 1 tablet (500 mg total) by mouth every 6 (six) hours as needed for muscle spasms.    . Multiple Vitamins-Minerals (PRESERVISION AREDS PO) Take 1 tablet by mouth 2 (two) times daily.    . nicotine (NICODERM CQ - DOSED IN MG/24 HOURS) 21 mg/24hr patch Place 1 patch (21 mg total) onto the skin daily. 28 patch 0  . omega-3 acid ethyl esters (LOVAZA) 1 G capsule Take 1 g by mouth daily.     . pantoprazole (PROTONIX) 40 MG tablet Take 40 mg by mouth daily.    . polyethylene glycol (MIRALAX / GLYCOLAX) packet Take 17 g by mouth daily. 14 each 0  . thiamine 100 MG tablet Take 100 mg by mouth daily.     No current facility-administered medications for this visit.     Allergies  Allergen Reactions  . Plavix [Clopidogrel Bisulfate] Other (See Comments)    Unknown reaction    Social History   Social History  . Marital status: Widowed  Spouse name: N/A  . Number of children: 0  . Years of education: 12th   Occupational History  . retired    Social History Main Topics  . Smoking status: Current Some Day Smoker    Packs/day: 1.50    Types: Cigarettes  . Smokeless tobacco: Not on file  . Alcohol use Yes     Comment: Drinks 2-3 times a week. 2oz of liquor.   . Drug use: No  . Sexual activity: Not Currently   Other Topics Concern  . Not on file   Social History Narrative  . No narrative on file     Review of Systems: General: negative for chills, fever, night sweats or weight changes.  Cardiovascular: negative for chest pain, dyspnea on exertion, edema, orthopnea, palpitations, paroxysmal nocturnal dyspnea or shortness of  breath Dermatological: negative for rash Respiratory: negative for cough or wheezing Urologic: negative for hematuria Abdominal: negative for nausea, vomiting, diarrhea, bright red blood per rectum, melena, or hematemesis Neurologic: negative for visual changes, syncope, or dizziness All other systems reviewed and are otherwise negative except as noted above.    Blood pressure (!) 146/82, pulse 66, height 6' (1.829 m), weight 202 lb 6.4 oz (91.8 kg).  General appearance: alert and no distress Neck: no adenopathy, no carotid bruit, no JVD, supple, symmetrical, trachea midline and thyroid not enlarged, symmetric, no tenderness/mass/nodules Lungs: clear to auscultation bilaterally Heart: irregularly irregular rhythm Extremities: extremities normal, atraumatic, no cyanosis or edema and Cool to palpation, somewhat cyanotic in appearance with palpable pedal pulses  EKG atrial fibrillation with a ventricular response of 66 and occasional aberrantly conducted beats with septal Q waves. He also had left axis deviation. I personally reviewed this EKG.  ASSESSMENT AND PLAN:   Peripheral arterial disease [facility]) Mr. Massari was referred by [name] ,  his podiatrist, for peripheral vascular evaluation. He has a long history of CAD, valvular heart disease chronic A. fib, ongoing tobacco abuse with COPD and chronic kidney disease with a creatinine that runs in the 2 range. He is on oral anticoagulants. He saw his podiatrist noticed a small sore on his right great toe and was questioning his circulation. His feet are somewhat cool although I can palpate pulses in both feet. He is not a candidate to have an angiogram given his chronic renal insufficiency. I will get lower extremity arterial Doppler studies to further demonstrate his circulation.      Lorretta Harp MD FACP,FACC,FAHA, [facility] 01/20/2017 12:42 PM

## 2017-01-20 NOTE — Patient Instructions (Signed)
Medication Instructions: Your physician recommends that you continue on your current medications as directed. Please refer to the Current Medication list given to you today.   Testing/Procedures: Your physician has requested that you have a lower extremity arterial duplex. During this test, ultrasound is used to evaluate arterial blood flow in the legs. Allow one hour for this exam. There are no restrictions or special instructions.  Your physician has requested that you have an ankle brachial index (ABI). During this test an ultrasound and blood pressure cuff are used to evaluate the arteries that supply the arms and legs with blood. Allow thirty minutes for this exam. There are no restrictions or special instructions.  Follow-Up: Your physician recommends that you schedule a follow-up appointment as needed with Dr. Berry.  If you need a refill on your cardiac medications before your next appointment, please call your pharmacy.  

## 2017-01-20 NOTE — Assessment & Plan Note (Signed)
Matthew Mahoney was referred by [name] ,  his podiatrist, for peripheral vascular evaluation. He has a long history of CAD, valvular heart disease chronic A. fib, ongoing tobacco abuse with COPD and chronic kidney disease with a creatinine that runs in the 2 range. He is on oral anticoagulants. He saw his podiatrist noticed a small sore on his right great toe and was questioning his circulation. His feet are somewhat cool although I can palpate pulses in both feet. He is not a candidate to have an angiogram given his chronic renal insufficiency. I will get lower extremity arterial Doppler studies to further demonstrate his circulation.

## 2017-01-26 ENCOUNTER — Encounter: Payer: Medicare Other | Admitting: Cardiovascular Disease

## 2017-02-04 ENCOUNTER — Inpatient Hospital Stay (HOSPITAL_COMMUNITY)
Admission: RE | Admit: 2017-02-04 | Discharge: 2017-02-04 | Disposition: A | Discharge status: Home / Self Care | Payer: Medicare Other | Source: Ambulatory Visit

## 2017-02-08 DIAGNOSIS — M810 Age-related osteoporosis without current pathological fracture: Secondary | ICD-10-CM | POA: Diagnosis not present

## 2017-02-10 DIAGNOSIS — J449 Chronic obstructive pulmonary disease, unspecified: Secondary | ICD-10-CM | POA: Diagnosis not present

## 2017-02-10 DIAGNOSIS — I10 Essential (primary) hypertension: Secondary | ICD-10-CM | POA: Diagnosis not present

## 2017-02-10 DIAGNOSIS — I708 Atherosclerosis of other arteries: Secondary | ICD-10-CM | POA: Diagnosis not present

## 2017-02-10 DIAGNOSIS — M810 Age-related osteoporosis without current pathological fracture: Secondary | ICD-10-CM | POA: Diagnosis not present

## 2017-02-11 ENCOUNTER — Ambulatory Visit (HOSPITAL_COMMUNITY)
Admission: RE | Admit: 2017-02-11 | Discharge: 2017-02-11 | Disposition: A | Discharge status: Home / Self Care | Payer: Medicare Other | Source: Ambulatory Visit | Attending: Internal Medicine | Admitting: Internal Medicine

## 2017-02-11 DIAGNOSIS — I739 Peripheral vascular disease, unspecified: Secondary | ICD-10-CM | POA: Diagnosis not present

## 2017-02-18 ENCOUNTER — Encounter (INDEPENDENT_AMBULATORY_CARE_PROVIDER_SITE_OTHER): Payer: Medicare Other | Admitting: Ophthalmology

## 2017-02-18 DIAGNOSIS — H353211 Exudative age-related macular degeneration, right eye, with active choroidal neovascularization: Secondary | ICD-10-CM | POA: Diagnosis not present

## 2017-02-18 DIAGNOSIS — H353124 Nonexudative age-related macular degeneration, left eye, advanced atrophic with subfoveal involvement: Secondary | ICD-10-CM

## 2017-02-18 DIAGNOSIS — D3132 Benign neoplasm of left choroid: Secondary | ICD-10-CM

## 2017-02-18 DIAGNOSIS — H43813 Vitreous degeneration, bilateral: Secondary | ICD-10-CM

## 2017-02-18 DIAGNOSIS — H35033 Hypertensive retinopathy, bilateral: Secondary | ICD-10-CM

## 2017-02-18 DIAGNOSIS — I10 Essential (primary) hypertension: Secondary | ICD-10-CM | POA: Diagnosis not present

## 2017-02-26 ENCOUNTER — Ambulatory Visit (INDEPENDENT_AMBULATORY_CARE_PROVIDER_SITE_OTHER): Payer: Medicare Other | Admitting: Cardiovascular Disease

## 2017-02-26 ENCOUNTER — Encounter: Payer: Self-pay | Admitting: Cardiovascular Disease

## 2017-02-26 DIAGNOSIS — I739 Peripheral vascular disease, unspecified: Secondary | ICD-10-CM | POA: Diagnosis not present

## 2017-02-26 NOTE — Progress Notes (Signed)
[name] returns today for follow-up of his right foot wound which has subsequently healed. He denies claudication. He walks with the aid of a cane. He had Dopplers performed 02/11/17 revealing a right ABI of 0.81 left of 1.05. He did have a high-frequency signal at the origin of his right SFA with 1 vessel runoff via posterior tibial artery. Interestingly, he did provide information of a cardiac catheterization performed by Dr. Mancel Bale at [facility]  at which time a Cypher 3 mm x 13 mm drug-eluting stent was placed in his LAD and an Angio-Seal was used for femoral hemostasis after this. This might explain the high-frequency signal at the origin of his right SFA (603 cm/s). At this point, given his lack of claudication and the fact that his wound healed in addition to his moderate renal insufficiency I do not feel angiography or intervention warranted.

## 2017-02-26 NOTE — Assessment & Plan Note (Signed)
Matthew Mahoney returns today for follow-up of his right foot wound which has subsequently healed. He denies claudication. He walks with the aid of a cane. He had Dopplers performed 02/11/17 revealing a right ABI of 0.81 left of 1.05. He did have a high-frequency signal at the origin of his right SFA with 1 vessel runoff via posterior tibial artery. Interestingly, he did provide information of a cardiac catheterization performed by Dr. Mancel Bale at [facility]  at which time a Cypher 3 mm x 13 mm drug-eluting stent was placed in his LAD and an Angio-Seal was used for femoral hemostasis after this. This might explain the high-frequency signal at the origin of his right SFA (603 cm/s). At this point, given his lack of claudication and the fact that his wound healed in addition to his moderate renal insufficiency I do not feel angiography or intervention warranted.

## 2017-02-26 NOTE — Patient Instructions (Signed)
Medication Instructions:  Your physician recommends that you continue on your current medications as directed. Please refer to the Current Medication list given to you today.   Labwork: NONE  Testing/Procedures: NONE  Follow-Up: Your physician recommends that you schedule a follow-up appointment in: PRN    Any Other Special Instructions Will Be Listed Below (If Applicable).     If you need a refill on your cardiac medications before your next appointment, please call your pharmacy.   

## 2017-03-16 ENCOUNTER — Telehealth: Payer: Self-pay | Admitting: General Practice

## 2017-03-16 NOTE — Telephone Encounter (Signed)
I spoke with the patient on 02/23/17 to confirm his PCP.  He stated that he saw his PCP about 2 weeks prior to our call. Duane Lope ([place])

## 2017-03-30 DIAGNOSIS — I10 Essential (primary) hypertension: Secondary | ICD-10-CM | POA: Diagnosis not present

## 2017-03-30 DIAGNOSIS — E785 Hyperlipidemia, unspecified: Secondary | ICD-10-CM | POA: Diagnosis not present

## 2017-03-30 DIAGNOSIS — N289 Disorder of kidney and ureter, unspecified: Secondary | ICD-10-CM | POA: Diagnosis not present

## 2017-04-01 ENCOUNTER — Encounter (INDEPENDENT_AMBULATORY_CARE_PROVIDER_SITE_OTHER): Payer: Medicare Other | Admitting: Ophthalmology

## 2017-04-01 DIAGNOSIS — H35033 Hypertensive retinopathy, bilateral: Secondary | ICD-10-CM | POA: Diagnosis not present

## 2017-04-01 DIAGNOSIS — H43813 Vitreous degeneration, bilateral: Secondary | ICD-10-CM

## 2017-04-01 DIAGNOSIS — H353211 Exudative age-related macular degeneration, right eye, with active choroidal neovascularization: Secondary | ICD-10-CM | POA: Diagnosis not present

## 2017-04-01 DIAGNOSIS — I10 Essential (primary) hypertension: Secondary | ICD-10-CM | POA: Diagnosis not present

## 2017-04-01 DIAGNOSIS — D3132 Benign neoplasm of left choroid: Secondary | ICD-10-CM

## 2017-04-01 DIAGNOSIS — H353124 Nonexudative age-related macular degeneration, left eye, advanced atrophic with subfoveal involvement: Secondary | ICD-10-CM

## 2017-04-02 DIAGNOSIS — J449 Chronic obstructive pulmonary disease, unspecified: Secondary | ICD-10-CM | POA: Diagnosis not present

## 2017-04-02 DIAGNOSIS — I10 Essential (primary) hypertension: Secondary | ICD-10-CM | POA: Diagnosis not present

## 2017-04-02 DIAGNOSIS — E782 Mixed hyperlipidemia: Secondary | ICD-10-CM | POA: Diagnosis not present

## 2017-04-21 DIAGNOSIS — C61 Malignant neoplasm of prostate: Secondary | ICD-10-CM | POA: Diagnosis not present

## 2017-04-28 DIAGNOSIS — C61 Malignant neoplasm of prostate: Secondary | ICD-10-CM | POA: Diagnosis not present

## 2017-04-28 DIAGNOSIS — M818 Other osteoporosis without current pathological fracture: Secondary | ICD-10-CM | POA: Diagnosis not present

## 2017-05-20 ENCOUNTER — Encounter (INDEPENDENT_AMBULATORY_CARE_PROVIDER_SITE_OTHER): Payer: Medicare Other | Admitting: Ophthalmology

## 2017-05-20 DIAGNOSIS — H353124 Nonexudative age-related macular degeneration, left eye, advanced atrophic with subfoveal involvement: Secondary | ICD-10-CM

## 2017-05-20 DIAGNOSIS — H35033 Hypertensive retinopathy, bilateral: Secondary | ICD-10-CM

## 2017-05-20 DIAGNOSIS — D3132 Benign neoplasm of left choroid: Secondary | ICD-10-CM | POA: Diagnosis not present

## 2017-05-20 DIAGNOSIS — I10 Essential (primary) hypertension: Secondary | ICD-10-CM | POA: Diagnosis not present

## 2017-05-20 DIAGNOSIS — H43813 Vitreous degeneration, bilateral: Secondary | ICD-10-CM

## 2017-05-20 DIAGNOSIS — H353211 Exudative age-related macular degeneration, right eye, with active choroidal neovascularization: Secondary | ICD-10-CM | POA: Diagnosis not present

## 2017-05-21 DIAGNOSIS — E785 Hyperlipidemia, unspecified: Secondary | ICD-10-CM | POA: Diagnosis not present

## 2017-05-21 DIAGNOSIS — I10 Essential (primary) hypertension: Secondary | ICD-10-CM | POA: Diagnosis not present

## 2017-05-21 DIAGNOSIS — Z125 Encounter for screening for malignant neoplasm of prostate: Secondary | ICD-10-CM | POA: Diagnosis not present

## 2017-05-24 ENCOUNTER — Other Ambulatory Visit: Payer: Self-pay | Admitting: Family Medicine

## 2017-05-24 DIAGNOSIS — F17200 Nicotine dependence, unspecified, uncomplicated: Secondary | ICD-10-CM

## 2017-05-24 DIAGNOSIS — I10 Essential (primary) hypertension: Secondary | ICD-10-CM | POA: Diagnosis not present

## 2017-05-24 DIAGNOSIS — I6789 Other cerebrovascular disease: Secondary | ICD-10-CM | POA: Diagnosis not present

## 2017-05-24 DIAGNOSIS — Z0000 Encounter for general adult medical examination without abnormal findings: Secondary | ICD-10-CM | POA: Diagnosis not present

## 2017-05-24 DIAGNOSIS — E782 Mixed hyperlipidemia: Secondary | ICD-10-CM | POA: Diagnosis not present

## 2017-06-03 ENCOUNTER — Ambulatory Visit
Admission: RE | Admit: 2017-06-03 | Discharge: 2017-06-03 | Disposition: A | Discharge status: Home / Self Care | Payer: Medicare Other | Source: Ambulatory Visit | Attending: Family Medicine | Admitting: Family Medicine

## 2017-06-03 DIAGNOSIS — Z136 Encounter for screening for cardiovascular disorders: Secondary | ICD-10-CM | POA: Diagnosis not present

## 2017-06-03 DIAGNOSIS — F17200 Nicotine dependence, unspecified, uncomplicated: Secondary | ICD-10-CM

## 2017-07-22 ENCOUNTER — Encounter (INDEPENDENT_AMBULATORY_CARE_PROVIDER_SITE_OTHER): Payer: Medicare Other | Admitting: Ophthalmology

## 2017-07-22 DIAGNOSIS — H353211 Exudative age-related macular degeneration, right eye, with active choroidal neovascularization: Secondary | ICD-10-CM | POA: Diagnosis not present

## 2017-07-22 DIAGNOSIS — H353122 Nonexudative age-related macular degeneration, left eye, intermediate dry stage: Secondary | ICD-10-CM | POA: Diagnosis not present

## 2017-07-22 DIAGNOSIS — I10 Essential (primary) hypertension: Secondary | ICD-10-CM

## 2017-07-22 DIAGNOSIS — H35033 Hypertensive retinopathy, bilateral: Secondary | ICD-10-CM | POA: Diagnosis not present

## 2017-07-22 DIAGNOSIS — H43813 Vitreous degeneration, bilateral: Secondary | ICD-10-CM | POA: Diagnosis not present

## 2017-07-29 ENCOUNTER — Telehealth: Payer: Self-pay

## 2017-07-29 NOTE — Telephone Encounter (Signed)
Patient on quality measure schedule visit list, called pt got voice mail, different name of voice mail. Did not leave message. Not our patient, seen in [place] in 2014. Dr. Ernie Hew listed as PCP.

## 2017-08-18 DIAGNOSIS — Z23 Encounter for immunization: Secondary | ICD-10-CM | POA: Diagnosis not present

## 2017-09-13 DIAGNOSIS — N289 Disorder of kidney and ureter, unspecified: Secondary | ICD-10-CM | POA: Diagnosis not present

## 2017-09-13 DIAGNOSIS — I10 Essential (primary) hypertension: Secondary | ICD-10-CM | POA: Diagnosis not present

## 2017-09-22 ENCOUNTER — Encounter (INDEPENDENT_AMBULATORY_CARE_PROVIDER_SITE_OTHER): Payer: Medicare Other | Admitting: Ophthalmology

## 2017-09-22 DIAGNOSIS — I10 Essential (primary) hypertension: Secondary | ICD-10-CM

## 2017-09-22 DIAGNOSIS — H353211 Exudative age-related macular degeneration, right eye, with active choroidal neovascularization: Secondary | ICD-10-CM | POA: Diagnosis not present

## 2017-09-22 DIAGNOSIS — H353122 Nonexudative age-related macular degeneration, left eye, intermediate dry stage: Secondary | ICD-10-CM | POA: Diagnosis not present

## 2017-09-22 DIAGNOSIS — H43813 Vitreous degeneration, bilateral: Secondary | ICD-10-CM | POA: Diagnosis not present

## 2017-09-22 DIAGNOSIS — H35033 Hypertensive retinopathy, bilateral: Secondary | ICD-10-CM

## 2017-09-29 DIAGNOSIS — I10 Essential (primary) hypertension: Secondary | ICD-10-CM | POA: Diagnosis not present

## 2017-09-29 DIAGNOSIS — E875 Hyperkalemia: Secondary | ICD-10-CM | POA: Diagnosis not present

## 2017-09-29 DIAGNOSIS — J449 Chronic obstructive pulmonary disease, unspecified: Secondary | ICD-10-CM | POA: Diagnosis not present

## 2017-09-29 DIAGNOSIS — N186 End stage renal disease: Secondary | ICD-10-CM | POA: Diagnosis not present

## 2017-10-01 DIAGNOSIS — E875 Hyperkalemia: Secondary | ICD-10-CM | POA: Diagnosis not present

## 2017-10-21 DIAGNOSIS — H353122 Nonexudative age-related macular degeneration, left eye, intermediate dry stage: Secondary | ICD-10-CM | POA: Diagnosis not present

## 2017-10-21 DIAGNOSIS — H353211 Exudative age-related macular degeneration, right eye, with active choroidal neovascularization: Secondary | ICD-10-CM | POA: Diagnosis not present

## 2017-10-21 DIAGNOSIS — H401123 Primary open-angle glaucoma, left eye, severe stage: Secondary | ICD-10-CM | POA: Diagnosis not present

## 2017-10-21 DIAGNOSIS — H401112 Primary open-angle glaucoma, right eye, moderate stage: Secondary | ICD-10-CM | POA: Diagnosis not present

## 2017-10-25 DIAGNOSIS — I10 Essential (primary) hypertension: Secondary | ICD-10-CM | POA: Diagnosis not present

## 2017-10-25 DIAGNOSIS — N289 Disorder of kidney and ureter, unspecified: Secondary | ICD-10-CM | POA: Diagnosis not present

## 2017-10-27 DIAGNOSIS — J449 Chronic obstructive pulmonary disease, unspecified: Secondary | ICD-10-CM | POA: Diagnosis not present

## 2017-10-27 DIAGNOSIS — C61 Malignant neoplasm of prostate: Secondary | ICD-10-CM | POA: Diagnosis not present

## 2017-10-27 DIAGNOSIS — E876 Hypokalemia: Secondary | ICD-10-CM | POA: Diagnosis not present

## 2017-10-27 DIAGNOSIS — N186 End stage renal disease: Secondary | ICD-10-CM | POA: Diagnosis not present

## 2017-11-08 DIAGNOSIS — C61 Malignant neoplasm of prostate: Secondary | ICD-10-CM | POA: Diagnosis not present

## 2017-11-24 ENCOUNTER — Encounter (INDEPENDENT_AMBULATORY_CARE_PROVIDER_SITE_OTHER): Payer: Medicare Other | Admitting: Ophthalmology

## 2017-11-24 DIAGNOSIS — H35033 Hypertensive retinopathy, bilateral: Secondary | ICD-10-CM | POA: Diagnosis not present

## 2017-11-24 DIAGNOSIS — I10 Essential (primary) hypertension: Secondary | ICD-10-CM

## 2017-11-24 DIAGNOSIS — H43813 Vitreous degeneration, bilateral: Secondary | ICD-10-CM

## 2017-11-24 DIAGNOSIS — H353211 Exudative age-related macular degeneration, right eye, with active choroidal neovascularization: Secondary | ICD-10-CM

## 2017-11-24 DIAGNOSIS — H353122 Nonexudative age-related macular degeneration, left eye, intermediate dry stage: Secondary | ICD-10-CM | POA: Diagnosis not present

## 2017-11-24 DIAGNOSIS — D3132 Benign neoplasm of left choroid: Secondary | ICD-10-CM

## 2018-01-26 ENCOUNTER — Encounter (INDEPENDENT_AMBULATORY_CARE_PROVIDER_SITE_OTHER): Payer: Medicare Other | Admitting: Ophthalmology

## 2018-01-26 DIAGNOSIS — H353122 Nonexudative age-related macular degeneration, left eye, intermediate dry stage: Secondary | ICD-10-CM | POA: Diagnosis not present

## 2018-01-26 DIAGNOSIS — H35033 Hypertensive retinopathy, bilateral: Secondary | ICD-10-CM | POA: Diagnosis not present

## 2018-01-26 DIAGNOSIS — I10 Essential (primary) hypertension: Secondary | ICD-10-CM | POA: Diagnosis not present

## 2018-01-26 DIAGNOSIS — H353211 Exudative age-related macular degeneration, right eye, with active choroidal neovascularization: Secondary | ICD-10-CM

## 2018-01-26 DIAGNOSIS — D3132 Benign neoplasm of left choroid: Secondary | ICD-10-CM

## 2018-01-26 DIAGNOSIS — H43813 Vitreous degeneration, bilateral: Secondary | ICD-10-CM | POA: Diagnosis not present

## 2018-03-23 DIAGNOSIS — H401123 Primary open-angle glaucoma, left eye, severe stage: Secondary | ICD-10-CM | POA: Diagnosis not present

## 2018-03-23 DIAGNOSIS — H01003 Unspecified blepharitis right eye, unspecified eyelid: Secondary | ICD-10-CM | POA: Diagnosis not present

## 2018-03-23 DIAGNOSIS — H04129 Dry eye syndrome of unspecified lacrimal gland: Secondary | ICD-10-CM | POA: Diagnosis not present

## 2018-03-23 DIAGNOSIS — H401112 Primary open-angle glaucoma, right eye, moderate stage: Secondary | ICD-10-CM | POA: Diagnosis not present

## 2018-04-06 ENCOUNTER — Encounter (INDEPENDENT_AMBULATORY_CARE_PROVIDER_SITE_OTHER): Payer: Medicare Other | Admitting: Ophthalmology

## 2018-04-06 DIAGNOSIS — H35033 Hypertensive retinopathy, bilateral: Secondary | ICD-10-CM | POA: Diagnosis not present

## 2018-04-06 DIAGNOSIS — D3132 Benign neoplasm of left choroid: Secondary | ICD-10-CM

## 2018-04-06 DIAGNOSIS — H43813 Vitreous degeneration, bilateral: Secondary | ICD-10-CM | POA: Diagnosis not present

## 2018-04-06 DIAGNOSIS — H353211 Exudative age-related macular degeneration, right eye, with active choroidal neovascularization: Secondary | ICD-10-CM | POA: Diagnosis not present

## 2018-04-06 DIAGNOSIS — I10 Essential (primary) hypertension: Secondary | ICD-10-CM

## 2018-04-06 DIAGNOSIS — H353122 Nonexudative age-related macular degeneration, left eye, intermediate dry stage: Secondary | ICD-10-CM | POA: Diagnosis not present

## 2018-05-05 DIAGNOSIS — C61 Malignant neoplasm of prostate: Secondary | ICD-10-CM | POA: Diagnosis not present

## 2018-05-09 DIAGNOSIS — C61 Malignant neoplasm of prostate: Secondary | ICD-10-CM | POA: Diagnosis not present

## 2018-05-16 DIAGNOSIS — M818 Other osteoporosis without current pathological fracture: Secondary | ICD-10-CM | POA: Diagnosis not present

## 2018-05-16 DIAGNOSIS — C61 Malignant neoplasm of prostate: Secondary | ICD-10-CM | POA: Diagnosis not present

## 2018-05-26 ENCOUNTER — Other Ambulatory Visit: Payer: Self-pay | Admitting: Urology

## 2018-06-14 DIAGNOSIS — E785 Hyperlipidemia, unspecified: Secondary | ICD-10-CM | POA: Diagnosis not present

## 2018-06-14 DIAGNOSIS — I10 Essential (primary) hypertension: Secondary | ICD-10-CM | POA: Diagnosis not present

## 2018-06-14 DIAGNOSIS — N289 Disorder of kidney and ureter, unspecified: Secondary | ICD-10-CM | POA: Diagnosis not present

## 2018-06-14 DIAGNOSIS — E876 Hypokalemia: Secondary | ICD-10-CM | POA: Diagnosis not present

## 2018-06-21 DIAGNOSIS — J449 Chronic obstructive pulmonary disease, unspecified: Secondary | ICD-10-CM | POA: Diagnosis not present

## 2018-06-21 DIAGNOSIS — N289 Disorder of kidney and ureter, unspecified: Secondary | ICD-10-CM | POA: Diagnosis not present

## 2018-06-21 DIAGNOSIS — I10 Essential (primary) hypertension: Secondary | ICD-10-CM | POA: Diagnosis not present

## 2018-06-22 ENCOUNTER — Encounter (INDEPENDENT_AMBULATORY_CARE_PROVIDER_SITE_OTHER): Payer: Medicare Other | Admitting: Ophthalmology

## 2018-06-22 DIAGNOSIS — I10 Essential (primary) hypertension: Secondary | ICD-10-CM | POA: Diagnosis not present

## 2018-06-22 DIAGNOSIS — H353211 Exudative age-related macular degeneration, right eye, with active choroidal neovascularization: Secondary | ICD-10-CM

## 2018-06-22 DIAGNOSIS — H35033 Hypertensive retinopathy, bilateral: Secondary | ICD-10-CM | POA: Diagnosis not present

## 2018-06-22 DIAGNOSIS — H353122 Nonexudative age-related macular degeneration, left eye, intermediate dry stage: Secondary | ICD-10-CM

## 2018-06-22 DIAGNOSIS — H43813 Vitreous degeneration, bilateral: Secondary | ICD-10-CM

## 2018-06-22 DIAGNOSIS — D3132 Benign neoplasm of left choroid: Secondary | ICD-10-CM | POA: Diagnosis not present

## 2018-07-05 DIAGNOSIS — E782 Mixed hyperlipidemia: Secondary | ICD-10-CM | POA: Diagnosis not present

## 2018-07-05 DIAGNOSIS — I10 Essential (primary) hypertension: Secondary | ICD-10-CM | POA: Diagnosis not present

## 2018-07-05 DIAGNOSIS — E785 Hyperlipidemia, unspecified: Secondary | ICD-10-CM | POA: Diagnosis not present

## 2018-07-05 DIAGNOSIS — N289 Disorder of kidney and ureter, unspecified: Secondary | ICD-10-CM | POA: Diagnosis not present

## 2018-07-05 DIAGNOSIS — E876 Hypokalemia: Secondary | ICD-10-CM | POA: Diagnosis not present

## 2018-07-05 DIAGNOSIS — Z79899 Other long term (current) drug therapy: Secondary | ICD-10-CM | POA: Diagnosis not present

## 2018-07-12 DIAGNOSIS — I10 Essential (primary) hypertension: Secondary | ICD-10-CM | POA: Diagnosis not present

## 2018-07-12 DIAGNOSIS — E782 Mixed hyperlipidemia: Secondary | ICD-10-CM | POA: Diagnosis not present

## 2018-07-12 DIAGNOSIS — J449 Chronic obstructive pulmonary disease, unspecified: Secondary | ICD-10-CM | POA: Diagnosis not present

## 2018-07-12 DIAGNOSIS — N186 End stage renal disease: Secondary | ICD-10-CM | POA: Diagnosis not present

## 2018-07-12 DIAGNOSIS — I5020 Unspecified systolic (congestive) heart failure: Secondary | ICD-10-CM | POA: Diagnosis not present

## 2018-07-27 DIAGNOSIS — J449 Chronic obstructive pulmonary disease, unspecified: Secondary | ICD-10-CM | POA: Diagnosis not present

## 2018-07-27 DIAGNOSIS — I10 Essential (primary) hypertension: Secondary | ICD-10-CM | POA: Diagnosis not present

## 2018-07-27 DIAGNOSIS — Z23 Encounter for immunization: Secondary | ICD-10-CM | POA: Diagnosis not present

## 2018-08-10 DIAGNOSIS — J449 Chronic obstructive pulmonary disease, unspecified: Secondary | ICD-10-CM | POA: Diagnosis not present

## 2018-08-10 DIAGNOSIS — Z0000 Encounter for general adult medical examination without abnormal findings: Secondary | ICD-10-CM | POA: Diagnosis not present

## 2018-08-17 ENCOUNTER — Other Ambulatory Visit: Payer: Self-pay | Admitting: Family Medicine

## 2018-08-17 DIAGNOSIS — F17200 Nicotine dependence, unspecified, uncomplicated: Secondary | ICD-10-CM

## 2018-08-23 ENCOUNTER — Ambulatory Visit
Admission: RE | Admit: 2018-08-23 | Discharge: 2018-08-23 | Disposition: A | Discharge status: Home / Self Care | Payer: Medicare Other | Source: Ambulatory Visit | Attending: Family Medicine | Admitting: Family Medicine

## 2018-08-23 DIAGNOSIS — Z122 Encounter for screening for malignant neoplasm of respiratory organs: Secondary | ICD-10-CM | POA: Diagnosis not present

## 2018-08-23 DIAGNOSIS — F17200 Nicotine dependence, unspecified, uncomplicated: Secondary | ICD-10-CM

## 2018-08-23 DIAGNOSIS — Z87891 Personal history of nicotine dependence: Secondary | ICD-10-CM | POA: Diagnosis not present

## 2018-08-23 DIAGNOSIS — Z136 Encounter for screening for cardiovascular disorders: Secondary | ICD-10-CM | POA: Diagnosis not present

## 2018-09-21 ENCOUNTER — Encounter (INDEPENDENT_AMBULATORY_CARE_PROVIDER_SITE_OTHER): Payer: Medicare Other | Admitting: Ophthalmology

## 2018-09-21 DIAGNOSIS — H353122 Nonexudative age-related macular degeneration, left eye, intermediate dry stage: Secondary | ICD-10-CM | POA: Diagnosis not present

## 2018-09-21 DIAGNOSIS — H353211 Exudative age-related macular degeneration, right eye, with active choroidal neovascularization: Secondary | ICD-10-CM | POA: Diagnosis not present

## 2018-09-21 DIAGNOSIS — I10 Essential (primary) hypertension: Secondary | ICD-10-CM

## 2018-09-21 DIAGNOSIS — H43813 Vitreous degeneration, bilateral: Secondary | ICD-10-CM

## 2018-09-21 DIAGNOSIS — D3132 Benign neoplasm of left choroid: Secondary | ICD-10-CM

## 2018-09-21 DIAGNOSIS — H35033 Hypertensive retinopathy, bilateral: Secondary | ICD-10-CM | POA: Diagnosis not present

## 2018-10-07 ENCOUNTER — Other Ambulatory Visit: Payer: Self-pay | Admitting: Urology

## 2018-10-07 DIAGNOSIS — Z79899 Other long term (current) drug therapy: Secondary | ICD-10-CM

## 2018-11-21 ENCOUNTER — Encounter: Payer: Self-pay | Admitting: Cardiovascular Disease

## 2018-11-21 ENCOUNTER — Ambulatory Visit: Payer: Medicare Other | Admitting: Cardiovascular Disease

## 2018-11-21 VITALS — BP 118/76 | HR 50 | Ht 72.0 in | Wt 195.0 lb

## 2018-11-21 DIAGNOSIS — I482 Chronic atrial fibrillation, unspecified: Secondary | ICD-10-CM | POA: Diagnosis not present

## 2018-11-21 DIAGNOSIS — I739 Peripheral vascular disease, unspecified: Secondary | ICD-10-CM

## 2018-11-21 NOTE — Progress Notes (Signed)
Matthew Mahoney Date of Birth: [birth year] Medical Record #[id number]   problem list 1. Essential hypertension 2. History prostate cancer 3. Coronary artery disease  4.  Atrial fib  5. CVA 6. Hip Fracture 7. COPD  8. CKD 9.     Matthew Mahoney is seen back today for a one month follow up visit.  He has a history of HTN, smoking and prostate cancer. Known CAD with remote stents and kept on Plavix. His other issues include substance abuse with alcohol and tobacco, prior CVA, AS, COPD, CKD, and thrombocytopenia.   Last seen by Dr. Acie Fredrickson back in 2012. He has had a 1st degree AV block with an incomplete RBBB and left anterior fascicular block reported in the past. Last echo in 2010 with a normal EF and mild aortic sclerosis.   Admitted early in 2014 with a left hip fracture. He had slipped and fell on the ice. Had ORIF on 01/21/13. Tolerated ok. Some concern about his rhythm. He apparently had multiple PAC's on the monitor but it looks like he stayed in sinus for that entire admission. CHADS is only a 1.   I saw him back in March of 2014 for a post hospital visit. He was at [employer]. Seemed to be doing ok.   He was admitted back in October with slurred speech and left sided facial droop. MRI showed 5 mm acute infarct - not treated with TPA due to delay of arrival. He was started on Eliquis, TEE attempted but unsuccessful due to desaturation. Had documented atrial fib/flutter (very first time noted) which was presumed to be the cause of his CVA. EF of 20 to 25% - there was talk of need for a stress test - this has not been carried out. Was given antibiotics for a presumed pneumonia.   Seen a month ago - Told me that he went to [facility] for a few weeks after his stroke. Now at home. His sister comes once a week to clean and fill his pill box. He was not short of breath. Not dizzy or lightheaded. Hds had 2 falls since he has been home - no significant injury noted. Has chronic swelling in  his legs - takes Lasix if it extends up to his thighs - tells me he has not really used any Lasix since he has been home. Saw neurology and was told that things were good. Wanting to get his teeth fixed. He thought he was doing ok. Still smoking 1 pk per day and drinking 3 to 4 oz of bourbon daily. Dr. Theone Murdoch' notes reviewed - his CCB has been cut back due to low HR/BP.   I left him on his Eliquis. We did not update his myoview. His prognosis is felt to be poor - mainly due to his social situation.  Comes back today. Here alone. Says he is doing ok. Not short of breath. He will use Lasix prn if he has more swelling - will do this for about 3 days and then stop. No falls. No chest pain. No awareness of his atrial fib. Not totally convinced that he is taking his medicines right. Asking about a Part D plan. He is smoking and drinking - has "3 good bourbons with sprite" every day. Says he is not driving while drinking.   Sept. 26, 2016: Seen back for visit.  I last saw him 4 years ago.  Was seen by Cecille Rubin 1 year ago.   See her notes above.  Has atrial fib Eats a very salty diet - lots of fast foods, canned foods.  Walks with a walker, does not get any exercise.  Has slowed his ETOH intake   February 27, 2016  Matthew Mahoney is seen back for follow up visit.  Overall condition has deteriated.  Walks with a cane - very slow gait  No CP , no dyspnea.  Still smoking - 1 ppd Some ETOH - 1-2 every afternoon   No dizziness or syncope  November 21, 2018: Matthew Mahoney is seen today for follow-up visit.  I last saw him 2-1/2 years ago. His overall condition continues to gradually deteriorate.  He has a history of hypertension, chronic atrial fibrillation, hyperlipidemia He has a history of peripheral vascular disease and has seen Dr. Gwenlyn Found   No CP or dyspnea.   Still smoking quite a bit  - still smokes 1 ppd.   2-3 cocktails each night  Eating well Has 1-2 + leg edema  Dr. Ernie Hew tried Lasix but this resulted in  acute renal insufficiency   Spends most of the day with his legs down.  Not active   Current Outpatient Medications  Medication Sig Dispense Refill  . acetaminophen (TYLENOL) 325 MG tablet Take 2 tablets (650 mg total) by mouth every 6 (six) hours as needed for mild pain (or Fever >/= 101).    Marland Kitchen albuterol (PROVENTIL HFA;VENTOLIN HFA) 108 (90 BASE) MCG/ACT inhaler Inhale 1 puff into the lungs every 6 (six) hours as needed for wheezing or shortness of breath.    Marland Kitchen alendronate (FOSAMAX) 70 MG tablet Take 1 tablet by mouth once a week.  4  . amLODipine (NORVASC) 5 MG tablet Take 1 tablet by mouth daily.    Marland Kitchen apixaban (ELIQUIS) 2.5 MG TABS tablet Take 1 tablet (2.5 mg total) by mouth 2 (two) times daily. 60 tablet 0  . atorvastatin (LIPITOR) 40 MG tablet Take 40 mg by mouth daily.    . bicalutamide (CASODEX) 50 MG tablet Take 50 mg by mouth daily.    . carvedilol (COREG) 3.125 MG tablet Take 1 tablet (3.125 mg total) by mouth 2 (two) times daily with a meal. 60 tablet 4  . Cholecalciferol (VITAMIN D-1000 MAX ST) 25 MCG (1000 UT) tablet Take 1 tablet by mouth daily.    . diphenhydramine-acetaminophen (TYLENOL PM) 25-500 MG TABS tablet Take 1 tablet by mouth at bedtime as needed.    . Fluticasone-Umeclidin-Vilant (TRELEGY ELLIPTA) 100-62.5-25 MCG/INH AEPB Inhale 1 puff into the lungs daily.    . furosemide (LASIX) 20 MG tablet Take 1 tablet by mouth 2 (two) times daily.    Marland Kitchen losartan (COZAAR) 100 MG tablet Take 100 mg by mouth daily.    . magnesium gluconate (MAGONATE) 500 MG tablet Take 500 mg by mouth daily.    . Multiple Vitamins-Minerals (PRESERVISION AREDS PO) Take 1 tablet by mouth 2 (two) times daily.    Marland Kitchen omega-3 acid ethyl esters (LOVAZA) 1 G capsule Take 1 g by mouth daily.     . pantoprazole (PROTONIX) 40 MG tablet Take 40 mg by mouth daily.    Marland Kitchen thiamine 100 MG tablet Take 100 mg by mouth daily.    . Travoprost, BAK Free, (TRAVATAN Z) 0.004 % SOLN ophthalmic solution Place 1 drop into  both eyes at bedtime.     No current facility-administered medications for this visit.     Allergies  Allergen Reactions  . Plavix [Clopidogrel Bisulfate] Other (See Comments)    Unknown reaction  Past Medical History:  Diagnosis Date  . Alcohol abuse 01/21/2013  . Aortic stenosis   . Ascites    HISTORY  . Atrial fibrillation ([place]) 09/23/2013  . Atrial flutter ([place]) 09/25/2013  . CAD (coronary artery disease) 10/11/2013  . CAP (community acquired pneumonia) 09/19/2013  . Choledocholithiasis   . Chronic combined systolic and diastolic congestive heart failure ([place]) 05/06/2015  . Closed fracture of femur, neck ([place]) 05/06/2015  . Conduction disorder of the heart 09/25/2013  . COPD (chronic obstructive pulmonary disease) ([place])   . Coronary arteriosclerosis   . CVA (cerebral infarction) 09/19/2013  . Edema of leg   . GERD (gastroesophageal reflux disease) 10/11/2013  . GI bleed    a. 03/2013 a/w hemorrhagic shock - EGD on 03/1013, which revealed active bleeding from proximal duodenum, with complete hemostasis following epinephrine injection therapy. At least 2 duodenal ulcers identified.  Marland Kitchen GIB (gastrointestinal bleeding) 03/16/2013  . Hemorrhagic shock ([place]) 03/16/2013  . Hip fracture, left ([place]) 01/21/2013  . History of colonic diverticulitis   . Hyperkalemia   . Hyperlipidemia   . Hypertension   . Hypocalcemia   . Ichthyosis   . Lymphadenopathy   . Mitral regurgitation   . Nonsustained ventricular tachycardia ([place]) 05/09/2015  . Olecranon bursitis   . Onychomycosis   . Prostate cancer ([place])   . Secondary cardiomyopathy, unspecified 09/23/2013  . Status post lung surgery 12/07/2001   BENIGHN LESION ON RIGHT LUNG REMOVED  . Thrombocytopenia ([place]) 09/25/2013  . UNSPECIFIED PERIPHERAL VASCULAR DISEASE 02/18/2011   Qualifier: Diagnosis of  By: Terald Sleeper    . Vitamin D deficiency     Past Surgical History:  Procedure Laterality Date  . COLONOSCOPY    .  ESOPHAGOGASTRODUODENOSCOPY N/A 03/16/2013   Procedure: ESOPHAGOGASTRODUODENOSCOPY (EGD);  Surgeon: Cleotis Nipper, MD;  Location: [facility] ENDOSCOPY;  Service: Endoscopy;  Laterality: N/A;  will probably want to do at bedside in ICU---bed placement pending  . FEMUR IM NAIL Left 01/21/2013   Procedure: INTRAMEDULLARY (IM) NAIL FEMORAL;  Surgeon: Hessie Dibble, MD;  Location: [facility] ORS;  Service: Orthopedics;  Laterality: Left;  . HEMORRHOID SURGERY  12/07/1998  . HIP PINNING,CANNULATED Right 05/07/2015   Procedure: CANNULATED HIP PINNING;  Surgeon: Phylliss Bob, MD;  Location: [facility] ORS;  Service: Orthopedics;  Laterality: Right;  . LOOP RECORDER IMPLANT N/A 09/22/2013   Procedure: LOOP RECORDER IMPLANT;  Surgeon: Coralyn Mark, MD;  Location: [facility] CATH LAB;  Service: Cardiovascular;  Laterality: N/A;  . LUNG SURGERY  12/07/2001   BENIGN LESION ON RIGHT LUNG REMOVED    Social History   Tobacco Use  Smoking Status Current Some Day Smoker  . Packs/day: 1.50  . Types: Cigarettes  Smokeless Tobacco Never Used    Social History   Substance and Sexual Activity  Alcohol Use Yes   Comment: Drinks 2-3 times a week. 2oz of liquor.     Family History  Problem Relation Age of Onset  . Hypertension Mother 43  . Hypertension Father 57  . Hypertension Sister   . Hypertension Brother   . Hypertension Sister   . Hypertension Brother   . Hypertension Brother   . Hypertension Brother   . Hypertension Brother     Review of Systems: The review of systems is per the HPI.  All other systems were reviewed and are negative.  Physical Exam: Blood pressure 118/76, pulse (!) 50, height 6' (1.829 m), weight 195 lb (88.5 kg), SpO2 98 %.  GEN:  Elderly male,  appears to be chronically ill,   Smells strongly of cigarettes .  HEENT: Normal NECK: No JVD; No carotid bruits LYMPHATICS: No lymphadenopathy CARDIAC: irreg. Irreg.  RESPIRATORY:  Clear to auscultation without rales, wheezing or rhonchi  ABDOMEN: Soft,  non-tender, non-distended MUSCULOSKELETAL:  No edema; No deformity  SKIN: Warm and dry NEUROLOGIC:  Alert and oriented x 3   Wt Readings from Last 3 Encounters:  11/21/18 195 lb (88.5 kg)  02/26/17 204 lb 3.2 oz (92.6 kg)  01/20/17 202 lb 6.4 oz (91.8 kg)     LABORATORY DATA: BMET and CBC pending  Lab Results  Component Value Date   WBC 10.0 05/09/2015   HGB 12.4 (L) 05/09/2015   HCT 39.0 05/09/2015   PLT 114 (L) 05/09/2015   GLUCOSE 96 02/27/2016   CHOL 107 (L) 02/27/2016   TRIG 115 02/27/2016   HDL 41 02/27/2016   LDLCALC 43 02/27/2016   ALT 13 02/27/2016   AST 18 02/27/2016   NA 138 02/27/2016   K 4.3 02/27/2016   CL 103 02/27/2016   CREATININE 1.89 (H) 02/27/2016   BUN 48 (H) 02/27/2016   CO2 22 02/27/2016   TSH 1.44 02/27/2016   INR 1.09 05/06/2015   HGBA1C 5.0 09/20/2013     ECG: Dec. 16, 2019: Atrial fibrillation with a slow ventricular response.  Heart rate is 50.  No ST or T wave changes.  Assessment / Plan:  1. Atrial fib/flutter - he remains in atrial fib  Chronic AF HR is slow Will DC coreg continfue Eliquis    2. CVA -   3. Substance abuse -  Advised smoking cessation   4. Chronic systolic congestive heart failure:  Stable     Mertie Moores, MD  11/21/2018 3:08 PM    [address],  [place], [place]  [address] Pager [phone] Phone: [phone]; Fax: [phone]

## 2018-11-21 NOTE — Patient Instructions (Addendum)
Medication Instructions:  Your physician has recommended you make the following change in your medication:  STOP Carvedilol (Coreg)  If you need a refill on your cardiac medications before your next appointment, please call your pharmacy.    Lab work: None Ordered    Testing/Procedures: None Ordered   Follow-Up: At [facility], you and your health needs are our priority.  As part of our continuing mission to provide you with exceptional heart care, we have created designated Provider Care Teams.  These Care Teams include your primary Cardiologist (physician) and Advanced Practice Providers (APPs -  Physician Assistants and Nurse Practitioners) who all work together to provide you with the care you need, when you need it. You will need a follow up appointment in:  1 years.  Please call our office 2 months in advance to schedule this appointment.  You may see Mertie Moores, MD or one of the following Advanced Practice Providers on your designated Care Team: Richardson Dopp, PA-C [place], [place] . Daune Perch, NP   For your  leg edema you  should do  the following 1. Leg elevation - I recommend the Lounge Dr. Leg rest.  See below for details  2. Salt restriction  -  Use potassium chloride instead of regular salt as a salt substitute. 3. Walk regularly 4. Compression hose - [facility] 5. Weight loss    Available on [email or web address] Or  Go to [email or web address]

## 2018-12-13 ENCOUNTER — Ambulatory Visit
Admission: RE | Admit: 2018-12-13 | Discharge: 2018-12-13 | Disposition: A | Discharge status: Home / Self Care | Payer: Medicare Other | Source: Ambulatory Visit | Attending: Urology | Admitting: Urology

## 2018-12-13 DIAGNOSIS — Z79899 Other long term (current) drug therapy: Secondary | ICD-10-CM

## 2019-01-04 ENCOUNTER — Encounter (INDEPENDENT_AMBULATORY_CARE_PROVIDER_SITE_OTHER): Payer: Medicare Other | Admitting: Ophthalmology

## 2019-01-04 DIAGNOSIS — D3132 Benign neoplasm of left choroid: Secondary | ICD-10-CM

## 2019-01-04 DIAGNOSIS — H353211 Exudative age-related macular degeneration, right eye, with active choroidal neovascularization: Secondary | ICD-10-CM

## 2019-01-04 DIAGNOSIS — H43813 Vitreous degeneration, bilateral: Secondary | ICD-10-CM

## 2019-01-04 DIAGNOSIS — I10 Essential (primary) hypertension: Secondary | ICD-10-CM | POA: Diagnosis not present

## 2019-01-04 DIAGNOSIS — H353122 Nonexudative age-related macular degeneration, left eye, intermediate dry stage: Secondary | ICD-10-CM

## 2019-01-04 DIAGNOSIS — H35033 Hypertensive retinopathy, bilateral: Secondary | ICD-10-CM | POA: Diagnosis not present

## 2019-04-19 ENCOUNTER — Other Ambulatory Visit: Payer: Self-pay | Admitting: Family Medicine

## 2019-04-19 DIAGNOSIS — Z136 Encounter for screening for cardiovascular disorders: Secondary | ICD-10-CM

## 2019-05-17 ENCOUNTER — Encounter (INDEPENDENT_AMBULATORY_CARE_PROVIDER_SITE_OTHER): Payer: Medicare Other | Admitting: Ophthalmology

## 2019-05-29 ENCOUNTER — Inpatient Hospital Stay: Admission: RE | Admit: 2019-05-29 | Payer: Medicare Other | Source: Ambulatory Visit

## 2020-06-05 ENCOUNTER — Encounter (INDEPENDENT_AMBULATORY_CARE_PROVIDER_SITE_OTHER): Payer: Medicare Other | Admitting: Ophthalmology

## 2020-06-05 ENCOUNTER — Other Ambulatory Visit: Payer: Self-pay

## 2020-06-05 DIAGNOSIS — H353122 Nonexudative age-related macular degeneration, left eye, intermediate dry stage: Secondary | ICD-10-CM

## 2020-06-05 DIAGNOSIS — H35033 Hypertensive retinopathy, bilateral: Secondary | ICD-10-CM

## 2020-06-05 DIAGNOSIS — D3132 Benign neoplasm of left choroid: Secondary | ICD-10-CM

## 2020-06-05 DIAGNOSIS — H353211 Exudative age-related macular degeneration, right eye, with active choroidal neovascularization: Secondary | ICD-10-CM

## 2020-06-05 DIAGNOSIS — H43813 Vitreous degeneration, bilateral: Secondary | ICD-10-CM

## 2020-06-05 DIAGNOSIS — I10 Essential (primary) hypertension: Secondary | ICD-10-CM

## 2020-12-11 ENCOUNTER — Encounter (INDEPENDENT_AMBULATORY_CARE_PROVIDER_SITE_OTHER): Payer: Medicare Other | Admitting: Ophthalmology

## 2020-12-11 ENCOUNTER — Other Ambulatory Visit: Payer: Self-pay

## 2020-12-11 DIAGNOSIS — H35033 Hypertensive retinopathy, bilateral: Secondary | ICD-10-CM | POA: Diagnosis not present

## 2020-12-11 DIAGNOSIS — I10 Essential (primary) hypertension: Secondary | ICD-10-CM

## 2020-12-11 DIAGNOSIS — H353211 Exudative age-related macular degeneration, right eye, with active choroidal neovascularization: Secondary | ICD-10-CM

## 2020-12-11 DIAGNOSIS — D3132 Benign neoplasm of left choroid: Secondary | ICD-10-CM

## 2020-12-11 DIAGNOSIS — H353122 Nonexudative age-related macular degeneration, left eye, intermediate dry stage: Secondary | ICD-10-CM

## 2020-12-11 DIAGNOSIS — H43813 Vitreous degeneration, bilateral: Secondary | ICD-10-CM

## 2021-06-12 ENCOUNTER — Encounter (INDEPENDENT_AMBULATORY_CARE_PROVIDER_SITE_OTHER): Payer: Medicare Other | Admitting: Ophthalmology

## 2021-06-12 ENCOUNTER — Other Ambulatory Visit: Payer: Self-pay

## 2021-06-12 DIAGNOSIS — I10 Essential (primary) hypertension: Secondary | ICD-10-CM

## 2021-06-12 DIAGNOSIS — H353124 Nonexudative age-related macular degeneration, left eye, advanced atrophic with subfoveal involvement: Secondary | ICD-10-CM | POA: Diagnosis not present

## 2021-06-12 DIAGNOSIS — H353211 Exudative age-related macular degeneration, right eye, with active choroidal neovascularization: Secondary | ICD-10-CM

## 2021-06-12 DIAGNOSIS — D3132 Benign neoplasm of left choroid: Secondary | ICD-10-CM | POA: Diagnosis not present

## 2021-06-12 DIAGNOSIS — H35033 Hypertensive retinopathy, bilateral: Secondary | ICD-10-CM

## 2021-06-12 DIAGNOSIS — H43813 Vitreous degeneration, bilateral: Secondary | ICD-10-CM

## 2021-07-02 ENCOUNTER — Other Ambulatory Visit: Payer: Self-pay | Admitting: Family Medicine

## 2021-07-02 DIAGNOSIS — R634 Abnormal weight loss: Secondary | ICD-10-CM

## 2021-07-02 DIAGNOSIS — C61 Malignant neoplasm of prostate: Secondary | ICD-10-CM

## 2021-07-16 ENCOUNTER — Other Ambulatory Visit: Payer: Self-pay

## 2021-07-16 ENCOUNTER — Encounter (INDEPENDENT_AMBULATORY_CARE_PROVIDER_SITE_OTHER): Payer: Medicare Other | Admitting: Ophthalmology

## 2021-07-16 DIAGNOSIS — H353124 Nonexudative age-related macular degeneration, left eye, advanced atrophic with subfoveal involvement: Secondary | ICD-10-CM | POA: Diagnosis not present

## 2021-07-16 DIAGNOSIS — H35033 Hypertensive retinopathy, bilateral: Secondary | ICD-10-CM

## 2021-07-16 DIAGNOSIS — D3132 Benign neoplasm of left choroid: Secondary | ICD-10-CM

## 2021-07-16 DIAGNOSIS — H353211 Exudative age-related macular degeneration, right eye, with active choroidal neovascularization: Secondary | ICD-10-CM | POA: Diagnosis not present

## 2021-07-16 DIAGNOSIS — I10 Essential (primary) hypertension: Secondary | ICD-10-CM | POA: Diagnosis not present

## 2021-07-16 DIAGNOSIS — H43813 Vitreous degeneration, bilateral: Secondary | ICD-10-CM

## 2021-07-22 ENCOUNTER — Other Ambulatory Visit: Payer: Self-pay | Admitting: Family Medicine

## 2021-07-22 ENCOUNTER — Ambulatory Visit
Admission: RE | Admit: 2021-07-22 | Discharge: 2021-07-22 | Disposition: A | Discharge status: Home / Self Care | Payer: Medicare Other | Source: Ambulatory Visit | Attending: Family Medicine | Admitting: Family Medicine

## 2021-07-22 ENCOUNTER — Other Ambulatory Visit: Payer: Self-pay

## 2021-07-22 DIAGNOSIS — R634 Abnormal weight loss: Secondary | ICD-10-CM

## 2021-07-22 DIAGNOSIS — C61 Malignant neoplasm of prostate: Secondary | ICD-10-CM

## 2021-08-27 ENCOUNTER — Encounter (INDEPENDENT_AMBULATORY_CARE_PROVIDER_SITE_OTHER): Payer: Medicare Other | Admitting: Ophthalmology

## 2021-09-17 ENCOUNTER — Emergency Department (HOSPITAL_COMMUNITY): Payer: Medicare Other

## 2021-09-17 ENCOUNTER — Other Ambulatory Visit: Payer: Self-pay

## 2021-09-17 ENCOUNTER — Inpatient Hospital Stay (HOSPITAL_COMMUNITY)
Admission: EM | Admit: 2021-09-17 | Discharge: 2021-09-24 | DRG: 602 | Disposition: A | Discharge status: Skilled Nursing Facility | Payer: Medicare Other | Attending: Internal Medicine | Admitting: Internal Medicine

## 2021-09-17 ENCOUNTER — Encounter (HOSPITAL_COMMUNITY): Payer: Self-pay | Admitting: Internal Medicine

## 2021-09-17 DIAGNOSIS — S80819A Abrasion, unspecified lower leg, initial encounter: Secondary | ICD-10-CM | POA: Diagnosis present

## 2021-09-17 DIAGNOSIS — H409 Unspecified glaucoma: Secondary | ICD-10-CM | POA: Diagnosis present

## 2021-09-17 DIAGNOSIS — N1832 Chronic kidney disease, stage 3b: Secondary | ICD-10-CM | POA: Diagnosis present

## 2021-09-17 DIAGNOSIS — E1122 Type 2 diabetes mellitus with diabetic chronic kidney disease: Secondary | ICD-10-CM | POA: Diagnosis present

## 2021-09-17 DIAGNOSIS — I130 Hypertensive heart and chronic kidney disease with heart failure and stage 1 through stage 4 chronic kidney disease, or unspecified chronic kidney disease: Secondary | ICD-10-CM | POA: Diagnosis present

## 2021-09-17 DIAGNOSIS — R001 Bradycardia, unspecified: Secondary | ICD-10-CM | POA: Diagnosis present

## 2021-09-17 DIAGNOSIS — L89322 Pressure ulcer of left buttock, stage 2: Secondary | ICD-10-CM | POA: Diagnosis present

## 2021-09-17 DIAGNOSIS — N179 Acute kidney failure, unspecified: Secondary | ICD-10-CM | POA: Diagnosis not present

## 2021-09-17 DIAGNOSIS — Z8249 Family history of ischemic heart disease and other diseases of the circulatory system: Secondary | ICD-10-CM

## 2021-09-17 DIAGNOSIS — F1020 Alcohol dependence, uncomplicated: Secondary | ICD-10-CM | POA: Diagnosis present

## 2021-09-17 DIAGNOSIS — L03115 Cellulitis of right lower limb: Secondary | ICD-10-CM | POA: Diagnosis not present

## 2021-09-17 DIAGNOSIS — L97919 Non-pressure chronic ulcer of unspecified part of right lower leg with unspecified severity: Secondary | ICD-10-CM | POA: Diagnosis present

## 2021-09-17 DIAGNOSIS — I452 Bifascicular block: Secondary | ICD-10-CM | POA: Diagnosis present

## 2021-09-17 DIAGNOSIS — R262 Difficulty in walking, not elsewhere classified: Secondary | ICD-10-CM | POA: Diagnosis present

## 2021-09-17 DIAGNOSIS — Z7902 Long term (current) use of antithrombotics/antiplatelets: Secondary | ICD-10-CM

## 2021-09-17 DIAGNOSIS — L8990 Pressure ulcer of unspecified site, unspecified stage: Secondary | ICD-10-CM | POA: Insufficient documentation

## 2021-09-17 DIAGNOSIS — I4891 Unspecified atrial fibrillation: Secondary | ICD-10-CM | POA: Diagnosis present

## 2021-09-17 DIAGNOSIS — I2510 Atherosclerotic heart disease of native coronary artery without angina pectoris: Secondary | ICD-10-CM | POA: Diagnosis present

## 2021-09-17 DIAGNOSIS — F1010 Alcohol abuse, uncomplicated: Secondary | ICD-10-CM | POA: Diagnosis present

## 2021-09-17 DIAGNOSIS — I480 Paroxysmal atrial fibrillation: Secondary | ICD-10-CM | POA: Diagnosis present

## 2021-09-17 DIAGNOSIS — J449 Chronic obstructive pulmonary disease, unspecified: Secondary | ICD-10-CM | POA: Diagnosis present

## 2021-09-17 DIAGNOSIS — K219 Gastro-esophageal reflux disease without esophagitis: Secondary | ICD-10-CM | POA: Diagnosis present

## 2021-09-17 DIAGNOSIS — L89312 Pressure ulcer of right buttock, stage 2: Secondary | ICD-10-CM | POA: Diagnosis present

## 2021-09-17 DIAGNOSIS — E43 Unspecified severe protein-calorie malnutrition: Secondary | ICD-10-CM | POA: Diagnosis present

## 2021-09-17 DIAGNOSIS — Z20822 Contact with and (suspected) exposure to covid-19: Secondary | ICD-10-CM | POA: Diagnosis not present

## 2021-09-17 DIAGNOSIS — Z8701 Personal history of pneumonia (recurrent): Secondary | ICD-10-CM

## 2021-09-17 DIAGNOSIS — Z8711 Personal history of peptic ulcer disease: Secondary | ICD-10-CM

## 2021-09-17 DIAGNOSIS — I5042 Chronic combined systolic (congestive) and diastolic (congestive) heart failure: Secondary | ICD-10-CM | POA: Diagnosis present

## 2021-09-17 DIAGNOSIS — E785 Hyperlipidemia, unspecified: Secondary | ICD-10-CM | POA: Diagnosis present

## 2021-09-17 DIAGNOSIS — Z888 Allergy status to other drugs, medicaments and biological substances status: Secondary | ICD-10-CM

## 2021-09-17 DIAGNOSIS — Z720 Tobacco use: Secondary | ICD-10-CM | POA: Diagnosis present

## 2021-09-17 DIAGNOSIS — Z7983 Long term (current) use of bisphosphonates: Secondary | ICD-10-CM

## 2021-09-17 DIAGNOSIS — L089 Local infection of the skin and subcutaneous tissue, unspecified: Secondary | ICD-10-CM | POA: Diagnosis present

## 2021-09-17 DIAGNOSIS — E872 Acidosis, unspecified: Secondary | ICD-10-CM | POA: Diagnosis not present

## 2021-09-17 DIAGNOSIS — Z955 Presence of coronary angioplasty implant and graft: Secondary | ICD-10-CM

## 2021-09-17 DIAGNOSIS — K222 Esophageal obstruction: Secondary | ICD-10-CM | POA: Diagnosis present

## 2021-09-17 DIAGNOSIS — F17210 Nicotine dependence, cigarettes, uncomplicated: Secondary | ICD-10-CM | POA: Diagnosis present

## 2021-09-17 DIAGNOSIS — E860 Dehydration: Secondary | ICD-10-CM | POA: Diagnosis present

## 2021-09-17 DIAGNOSIS — D631 Anemia in chronic kidney disease: Secondary | ICD-10-CM | POA: Diagnosis present

## 2021-09-17 DIAGNOSIS — Z8673 Personal history of transient ischemic attack (TIA), and cerebral infarction without residual deficits: Secondary | ICD-10-CM

## 2021-09-17 DIAGNOSIS — E1151 Type 2 diabetes mellitus with diabetic peripheral angiopathy without gangrene: Secondary | ICD-10-CM | POA: Diagnosis present

## 2021-09-17 DIAGNOSIS — Z7951 Long term (current) use of inhaled steroids: Secondary | ICD-10-CM

## 2021-09-17 DIAGNOSIS — I739 Peripheral vascular disease, unspecified: Secondary | ICD-10-CM | POA: Diagnosis present

## 2021-09-17 DIAGNOSIS — K2970 Gastritis, unspecified, without bleeding: Secondary | ICD-10-CM | POA: Diagnosis not present

## 2021-09-17 DIAGNOSIS — Z6829 Body mass index (BMI) 29.0-29.9, adult: Secondary | ICD-10-CM

## 2021-09-17 DIAGNOSIS — Z7901 Long term (current) use of anticoagulants: Secondary | ICD-10-CM

## 2021-09-17 DIAGNOSIS — R296 Repeated falls: Secondary | ICD-10-CM | POA: Diagnosis present

## 2021-09-17 DIAGNOSIS — K449 Diaphragmatic hernia without obstruction or gangrene: Secondary | ICD-10-CM | POA: Diagnosis present

## 2021-09-17 DIAGNOSIS — K2960 Other gastritis without bleeding: Secondary | ICD-10-CM | POA: Diagnosis present

## 2021-09-17 DIAGNOSIS — I10 Essential (primary) hypertension: Secondary | ICD-10-CM | POA: Diagnosis present

## 2021-09-17 DIAGNOSIS — Z79899 Other long term (current) drug therapy: Secondary | ICD-10-CM

## 2021-09-17 DIAGNOSIS — D649 Anemia, unspecified: Secondary | ICD-10-CM | POA: Diagnosis not present

## 2021-09-17 DIAGNOSIS — L0390 Cellulitis, unspecified: Secondary | ICD-10-CM | POA: Diagnosis not present

## 2021-09-17 DIAGNOSIS — Z8546 Personal history of malignant neoplasm of prostate: Secondary | ICD-10-CM

## 2021-09-17 LAB — TROPONIN I (HIGH SENSITIVITY)
Troponin I (High Sensitivity): 10 ng/L (ref <18)
Troponin I (High Sensitivity): 5 ng/L (ref <18)

## 2021-09-17 LAB — CBC WITH DIFFERENTIAL/PLATELET
Abs Immature Granulocytes: 0.02 10*3/uL (ref 0.00–0.07)
Basophils Absolute: 0.1 10*3/uL (ref 0.0–0.1)
Basophils Relative: 2 %
Eosinophils Absolute: 0.1 10*3/uL (ref 0.0–0.5)
Eosinophils Relative: 2 %
HCT: 29.3 % — ABNORMAL LOW (ref 39.0–52.0)
Hemoglobin: 9.4 g/dL — ABNORMAL LOW (ref 13.0–17.0)
Immature Granulocytes: 0 %
Lymphocytes Relative: 20 %
Lymphs Abs: 1.5 10*3/uL (ref 0.7–4.0)
MCH: 31.3 pg (ref 26.0–34.0)
MCHC: 32.1 g/dL (ref 30.0–36.0)
MCV: 97.7 fL (ref 80.0–100.0)
Monocytes Absolute: 0.7 10*3/uL (ref 0.1–1.0)
Monocytes Relative: 9 %
Neutro Abs: 5 10*3/uL (ref 1.7–7.7)
Neutrophils Relative %: 67 %
Platelets: 121 10*3/uL — ABNORMAL LOW (ref 150–400)
RBC: 3 MIL/uL — ABNORMAL LOW (ref 4.22–5.81)
RDW: 15.3 % (ref 11.5–15.5)
WBC: 7.4 10*3/uL (ref 4.0–10.5)
nRBC: 0 % (ref 0.0–0.2)

## 2021-09-17 LAB — COMPREHENSIVE METABOLIC PANEL
ALT: 8 U/L (ref 0–44)
AST: 13 U/L — ABNORMAL LOW (ref 15–41)
Albumin: 2.8 g/dL — ABNORMAL LOW (ref 3.5–5.0)
Alkaline Phosphatase: 67 U/L (ref 38–126)
Anion gap: 10 (ref 5–15)
BUN: 56 mg/dL — ABNORMAL HIGH (ref 8–23)
CO2: 19 mmol/L — ABNORMAL LOW (ref 22–32)
Calcium: 8.3 mg/dL — ABNORMAL LOW (ref 8.9–10.3)
Chloride: 107 mmol/L (ref 98–111)
Creatinine, Ser: 1.99 mg/dL — ABNORMAL HIGH (ref 0.61–1.24)
GFR, Estimated: 33 mL/min — ABNORMAL LOW (ref >60)
Glucose, Bld: 101 mg/dL — ABNORMAL HIGH (ref 70–99)
Potassium: 5.5 mmol/L — ABNORMAL HIGH (ref 3.5–5.1)
Sodium: 136 mmol/L (ref 135–145)
Total Bilirubin: 1 mg/dL (ref 0.3–1.2)
Total Protein: 5.5 g/dL — ABNORMAL LOW (ref 6.5–8.1)

## 2021-09-17 LAB — TYPE AND SCREEN
ABO/RH(D): O POS
Antibody Screen: NEGATIVE

## 2021-09-17 LAB — RESP PANEL BY RT-PCR (FLU A&B, COVID) ARPGX2
Influenza A by PCR: NEGATIVE
Influenza B by PCR: NEGATIVE
SARS Coronavirus 2 by RT PCR: NEGATIVE

## 2021-09-17 LAB — POC OCCULT BLOOD, ED: Fecal Occult Bld: NEGATIVE

## 2021-09-17 LAB — BRAIN NATRIURETIC PEPTIDE: B Natriuretic Peptide: 353.6 pg/mL — ABNORMAL HIGH (ref 0.0–100.0)

## 2021-09-17 LAB — TSH: TSH: 2.224 u[IU]/mL (ref 0.350–4.500)

## 2021-09-17 MED ORDER — METRONIDAZOLE 500 MG/100ML IV SOLN
500.0000 mg | Freq: Two times a day (BID) | INTRAVENOUS | Status: DC
  Administered 2021-09-17 – 2021-09-18 (×3): 500 mg via INTRAVENOUS
  Filled 2021-09-17 (×3): qty 100

## 2021-09-17 MED ORDER — AMLODIPINE BESYLATE 5 MG PO TABS
2.5000 mg | ORAL_TABLET | Freq: Every day | ORAL | Status: DC
  Administered 2021-09-18 – 2021-09-24 (×7): 2.5 mg via ORAL
  Filled 2021-09-17 (×7): qty 1

## 2021-09-17 MED ORDER — HYDROCODONE-ACETAMINOPHEN 5-325 MG PO TABS: 1.0000 | ORAL_TABLET | ORAL | Status: DC | PRN

## 2021-09-17 MED ORDER — NICOTINE 14 MG/24HR TD PT24
14.0000 mg | MEDICATED_PATCH | Freq: Every day | TRANSDERMAL | Status: DC
  Administered 2021-09-17 – 2021-09-24 (×8): 14 mg via TRANSDERMAL
  Filled 2021-09-17 (×8): qty 1

## 2021-09-17 MED ORDER — LORAZEPAM 2 MG/ML IJ SOLN: 1.0000 mg | INTRAMUSCULAR | Status: AC | PRN

## 2021-09-17 MED ORDER — THIAMINE HCL 100 MG/ML IJ SOLN: 100.0000 mg | Freq: Every day | INTRAMUSCULAR | Status: DC

## 2021-09-17 MED ORDER — HYDRALAZINE HCL 20 MG/ML IJ SOLN: 5.0000 mg | INTRAMUSCULAR | Status: DC | PRN

## 2021-09-17 MED ORDER — SODIUM CHLORIDE 0.9% FLUSH
3.0000 mL | Freq: Two times a day (BID) | INTRAVENOUS | Status: DC
  Administered 2021-09-17 – 2021-09-24 (×8): 3 mL via INTRAVENOUS

## 2021-09-17 MED ORDER — ATORVASTATIN CALCIUM 40 MG PO TABS
40.0000 mg | ORAL_TABLET | Freq: Every evening | ORAL | Status: DC
  Administered 2021-09-17 – 2021-09-23 (×7): 40 mg via ORAL
  Filled 2021-09-17 (×8): qty 1

## 2021-09-17 MED ORDER — CEFTRIAXONE SODIUM 2 G IJ SOLR
2.0000 g | INTRAMUSCULAR | Status: DC
  Administered 2021-09-17 – 2021-09-18 (×2): 2 g via INTRAVENOUS
  Filled 2021-09-17 (×2): qty 20

## 2021-09-17 MED ORDER — APIXABAN 2.5 MG PO TABS
2.5000 mg | ORAL_TABLET | Freq: Two times a day (BID) | ORAL | Status: DC
  Administered 2021-09-17: 2.5 mg via ORAL
  Filled 2021-09-17: qty 1

## 2021-09-17 MED ORDER — THIAMINE HCL 100 MG PO TABS
100.0000 mg | ORAL_TABLET | Freq: Every day | ORAL | Status: DC
  Administered 2021-09-17 – 2021-09-24 (×8): 100 mg via ORAL
  Filled 2021-09-17 (×8): qty 1

## 2021-09-17 MED ORDER — DOCUSATE SODIUM 100 MG PO CAPS
100.0000 mg | ORAL_CAPSULE | Freq: Two times a day (BID) | ORAL | Status: DC
  Administered 2021-09-18 – 2021-09-23 (×8): 100 mg via ORAL
  Filled 2021-09-17 (×11): qty 1

## 2021-09-17 MED ORDER — UMECLIDINIUM BROMIDE 62.5 MCG/INH IN AEPB
1.0000 | INHALATION_SPRAY | Freq: Every day | RESPIRATORY_TRACT | Status: DC
  Administered 2021-09-19 – 2021-09-24 (×5): 1 via RESPIRATORY_TRACT
  Filled 2021-09-17 (×2): qty 7

## 2021-09-17 MED ORDER — ONDANSETRON HCL 4 MG/2ML IJ SOLN: 4.0000 mg | Freq: Four times a day (QID) | INTRAMUSCULAR | Status: DC | PRN

## 2021-09-17 MED ORDER — ONDANSETRON HCL 4 MG PO TABS: 4.0000 mg | ORAL_TABLET | Freq: Four times a day (QID) | ORAL | Status: DC | PRN

## 2021-09-17 MED ORDER — ACETAMINOPHEN 650 MG RE SUPP: 650.0000 mg | Freq: Four times a day (QID) | RECTAL | Status: DC | PRN

## 2021-09-17 MED ORDER — ALBUTEROL SULFATE (2.5 MG/3ML) 0.083% IN NEBU: 2.5000 mg | INHALATION_SOLUTION | Freq: Four times a day (QID) | RESPIRATORY_TRACT | Status: DC | PRN

## 2021-09-17 MED ORDER — MORPHINE SULFATE (PF) 2 MG/ML IV SOLN: 2.0000 mg | INTRAVENOUS | Status: DC | PRN

## 2021-09-17 MED ORDER — FOLIC ACID 1 MG PO TABS
1.0000 mg | ORAL_TABLET | Freq: Every day | ORAL | Status: DC
  Administered 2021-09-17 – 2021-09-24 (×8): 1 mg via ORAL
  Filled 2021-09-17 (×8): qty 1

## 2021-09-17 MED ORDER — LORAZEPAM 1 MG PO TABS: 1.0000 mg | ORAL_TABLET | ORAL | Status: AC | PRN

## 2021-09-17 MED ORDER — SODIUM CHLORIDE 0.9 % IV SOLN: INTRAVENOUS | Status: DC

## 2021-09-17 MED ORDER — PANTOPRAZOLE SODIUM 40 MG PO TBEC
40.0000 mg | DELAYED_RELEASE_TABLET | Freq: Every day | ORAL | Status: DC
  Administered 2021-09-18 – 2021-09-20 (×3): 40 mg via ORAL
  Filled 2021-09-17 (×3): qty 1

## 2021-09-17 MED ORDER — FLUTICASONE-UMECLIDIN-VILANT 100-62.5-25 MCG/INH IN AEPB: 1.0000 | INHALATION_SPRAY | Freq: Every day | RESPIRATORY_TRACT | Status: DC

## 2021-09-17 MED ORDER — FLUTICASONE FUROATE-VILANTEROL 100-25 MCG/INH IN AEPB
1.0000 | INHALATION_SPRAY | Freq: Every day | RESPIRATORY_TRACT | Status: DC
  Administered 2021-09-19 – 2021-09-24 (×5): 1 via RESPIRATORY_TRACT
  Filled 2021-09-17: qty 28

## 2021-09-17 MED ORDER — SODIUM ZIRCONIUM CYCLOSILICATE 10 G PO PACK
10.0000 g | PACK | Freq: Once | ORAL | Status: AC
Start: 2021-09-17 — End: 2021-09-17
  Administered 2021-09-17: 10 g via ORAL
  Filled 2021-09-17: qty 1

## 2021-09-17 MED ORDER — LACTATED RINGERS IV SOLN: INTRAVENOUS | Status: DC

## 2021-09-17 MED ORDER — LATANOPROST 0.005 % OP SOLN
1.0000 [drp] | Freq: Every day | OPHTHALMIC | Status: DC
  Administered 2021-09-17 – 2021-09-23 (×7): 1 [drp] via OPHTHALMIC
  Filled 2021-09-17: qty 2.5

## 2021-09-17 MED ORDER — ACETAMINOPHEN 325 MG PO TABS: 650.0000 mg | ORAL_TABLET | Freq: Four times a day (QID) | ORAL | Status: DC | PRN

## 2021-09-17 MED ORDER — BISACODYL 5 MG PO TBEC: 5.0000 mg | DELAYED_RELEASE_TABLET | Freq: Every day | ORAL | Status: DC | PRN

## 2021-09-17 MED ORDER — ADULT MULTIVITAMIN W/MINERALS CH
1.0000 | ORAL_TABLET | Freq: Every day | ORAL | Status: DC
  Administered 2021-09-17 – 2021-09-24 (×8): 1 via ORAL
  Filled 2021-09-17 (×8): qty 1

## 2021-09-17 MED ORDER — POLYETHYLENE GLYCOL 3350 17 G PO PACK
17.0000 g | PACK | Freq: Every day | ORAL | Status: DC | PRN
  Administered 2021-09-23: 17 g via ORAL
  Filled 2021-09-17: qty 1

## 2021-09-17 MED ORDER — SACUBITRIL-VALSARTAN 49-51 MG PO TABS
1.0000 | ORAL_TABLET | Freq: Two times a day (BID) | ORAL | Status: DC
  Administered 2021-09-17 – 2021-09-24 (×14): 1 via ORAL
  Filled 2021-09-17 (×15): qty 1

## 2021-09-17 NOTE — ED Triage Notes (Signed)
Pt calls EMS for weakness x1 week. [facility] finds pt to be hypotensive. Aox4 entire time. HR 34-48. Denies CP, Abd pain. Reports dark stools.  Fall last week- pain to right leg and arm. On thinners.

## 2021-09-17 NOTE — ED Provider Notes (Signed)
[facility] EMERGENCY DEPARTMENT Provider Note   CSN: [id number] Arrival date & time: 09/17/21  1133     History Chief Complaint  Patient presents with   Bradycardia    Matthew Mahoney is a 85 y.o. male.  85 year old male who presents with weakness times several days.  Today was unable to get out of bed.  Denies any fever, cough, congestion.  He has had no emesis.  Has had some dark stools.  But denies any abdominal pain.  He is on Eliquis for history of A. fib.  Also has a history of hyperkalemia.  EMS was called by him and was found to be bradycardic as low was in the 30s as high as in the 40s.  Blood pressure was stable with this with a systolic over 563.  No treatment done by EMS and patient transported here      Past Medical History:  Diagnosis Date   Alcohol abuse 01/21/2013   Aortic stenosis    Ascites    HISTORY   Atrial fibrillation ([place]) 09/23/2013   Atrial flutter ([place]) 09/25/2013   CAD (coronary artery disease) 10/11/2013   CAP (community acquired pneumonia) 09/19/2013   Choledocholithiasis    Chronic combined systolic and diastolic congestive heart failure ([place]) 05/06/2015   Closed fracture of femur, neck ([place]) 05/06/2015   Conduction disorder of the heart 09/25/2013   COPD (chronic obstructive pulmonary disease) ([place])    Coronary arteriosclerosis    CVA (cerebral infarction) 09/19/2013   Edema of leg    GERD (gastroesophageal reflux disease) 10/11/2013   GI bleed    a. 03/2013 a/w hemorrhagic shock - EGD on 03/1013, which revealed active bleeding from proximal duodenum, with complete hemostasis following epinephrine injection therapy. At least 2 duodenal ulcers identified.   GIB (gastrointestinal bleeding) 03/16/2013   Hemorrhagic shock ([place]) 03/16/2013   Hip fracture, left ([place]) 01/21/2013   History of colonic diverticulitis    Hyperkalemia    Hyperlipidemia    Hypertension    Hypocalcemia    Ichthyosis    Lymphadenopathy    Mitral  regurgitation    Nonsustained ventricular tachycardia ([place]) 05/09/2015   Olecranon bursitis    Onychomycosis    Prostate cancer ([place])    Secondary cardiomyopathy, unspecified 09/23/2013   Status post lung surgery 12/07/2001   BENIGHN LESION ON RIGHT LUNG REMOVED   Thrombocytopenia ([place]) 09/25/2013   UNSPECIFIED PERIPHERAL VASCULAR DISEASE 02/18/2011   Qualifier: Diagnosis of  By: Terald Sleeper     Vitamin D deficiency     Patient Active Problem List   Diagnosis Date Noted   Peripheral arterial disease ([place]) 01/20/2017   Nonsustained ventricular tachycardia 05/09/2015   Closed fracture of femur, neck ([place]) 05/06/2015   Chronic combined systolic and diastolic congestive heart failure ([place]) 05/06/2015   GERD (gastroesophageal reflux disease) 10/11/2013   CAD (coronary artery disease) 10/11/2013   Atrial flutter ([place]) 09/25/2013   Conduction disorder of the heart 09/25/2013   Thrombocytopenia ([place]) 09/25/2013   Atrial fibrillation ([place]) 09/23/2013   Secondary cardiomyopathy, unspecified 09/23/2013   CVA (cerebral infarction) 09/19/2013   CKD (chronic kidney disease), stage III ([place]) 09/19/2013   Alcohol abuse 01/21/2013   Tobacco abuse 01/21/2013   Hyponatremia 01/21/2013   Hypertension    COPD (chronic obstructive pulmonary disease) ([place])    Choledocholithiasis    Coronary arteriosclerosis    Hyperlipidemia    Ichthyosis    Mitral regurgitation    Onychomycosis    Vitamin D deficiency  Aortic stenosis    Prostate cancer ([place])    Ascites    History of colonic diverticulitis    Peripheral vascular disease ([place]) 02/18/2011   OTHER SYMPTOMS INVOLVING CARDIOVASCULAR SYSTEM 02/18/2011    Past Surgical History:  Procedure Laterality Date   COLONOSCOPY     ESOPHAGOGASTRODUODENOSCOPY N/A 03/16/2013   Procedure: ESOPHAGOGASTRODUODENOSCOPY (EGD);  Surgeon: Cleotis Nipper, MD;  [facility] END[facility]COPY;  Service: Endoscopy;  Laterality: N/A;  will probably want to do at bedside  in ICU---bed placement pending   FEMUR IM NAIL Left 01/21/2013   Procedure: INTRAMEDULLARY (IM) NAIL FEMORAL;  Surgeon: Hessie Dibble, MD;  Location: [facility] ORS;  Service: Orthopedics;  Laterality: Left;   HEMORRHOID SURGERY  12/07/1998   HIP PINNING,CANNULATED Right 05/07/2015   Procedure: CANNULATED HIP PINNING;  Surgeon: Phylliss Bob, MD;  Location: [facility] ORS;  Service: Orthopedics;  Laterality: Right;   LOOP RECORDER IMPLANT N/A 09/22/2013   Procedure: LOOP RECORDER IMPLANT;  Surgeon: Coralyn Mark, MD;  Location: [facility] CATH LAB;  Service: Cardiovascular;  Laterality: N/A;   LUNG SURGERY  12/07/2001   BENIGN LESION ON RIGHT LUNG REMOVED       Family History  Problem Relation Age of Onset   Hypertension Mother 75   Hypertension Father 33   Hypertension Sister    Hypertension Brother    Hypertension Sister    Hypertension Brother    Hypertension Brother    Hypertension Brother    Hypertension Brother     Social History   Tobacco Use   Smoking status: Some Days    Packs/day: 1.50    Types: Cigarettes   Smokeless tobacco: Never  Substance Use Topics   Alcohol use: Yes    Comment: Drinks 2-3 times a week. 2oz of liquor.    Drug use: No    Home Medications Prior to Admission medications   Medication Sig Start Date End Date Taking? Authorizing Provider  acetaminophen (TYLENOL) 325 MG tablet Take 2 tablets (650 mg total) by mouth every 6 (six) hours as needed for mild pain (or Fever >/= 101). 05/09/15   Rama, Venetia Maxon, MD  albuterol (PROVENTIL HFA;VENTOLIN HFA) 108 (90 BASE) MCG/ACT inhaler Inhale 1 puff into the lungs every 6 (six) hours as needed for wheezing or shortness of breath.    [provider]  alendronate (FOSAMAX) 70 MG tablet Take 1 tablet by mouth once a week. 08/23/18   [provider]  amLODipine (NORVASC) 5 MG tablet Take 1 tablet by mouth daily.    [provider]  apixaban (ELIQUIS) 2.5 MG TABS tablet Take 1 tablet (2.5 mg total) by mouth  2 (two) times daily. 09/25/13   Velvet Bathe, MD  atorvastatin (LIPITOR) 40 MG tablet Take 40 mg by mouth daily.    [provider]  bicalutamide (CASODEX) 50 MG tablet Take 50 mg by mouth daily.    [provider]  Cholecalciferol (VITAMIN D-1000 MAX ST) 25 MCG (1000 UT) tablet Take 1 tablet by mouth daily.    [provider]  diphenhydramine-acetaminophen (TYLENOL PM) 25-500 MG TABS tablet Take 1 tablet by mouth at bedtime as needed.    [provider]  Fluticasone-Umeclidin-Vilant (TRELEGY ELLIPTA) 100-62.5-25 MCG/INH AEPB Inhale 1 puff into the lungs daily.    [provider]  furosemide (LASIX) 20 MG tablet Take 1 tablet by mouth 2 (two) times daily. 05/05/13   [provider]  losartan (COZAAR) 100 MG tablet Take 100 mg by mouth daily.  [provider]  magnesium gluconate (MAGONATE) 500 MG tablet Take 500 mg by mouth daily.    [provider]  Multiple Vitamins-Minerals (PRESERVISION AREDS PO) Take 1 tablet by mouth 2 (two) times daily.    [provider]  omega-3 acid ethyl esters (LOVAZA) 1 G capsule Take 1 g by mouth daily.     [provider]  pantoprazole (PROTONIX) 40 MG tablet Take 40 mg by mouth daily.    [provider]  thiamine 100 MG tablet Take 100 mg by mouth daily.    [provider]  Travoprost, BAK Free, (TRAVATAN Z) 0.004 % SOLN ophthalmic solution Place 1 drop into both eyes at bedtime.    [provider]    Allergies    Plavix [clopidogrel bisulfate]  Review of Systems   Review of Systems  All other systems reviewed and are negative.  Physical Exam Updated Vital Signs BP 110/80 (BP Location: Right Arm)   Pulse (!) 44   Temp (!) 97.4 F (36.3 C) (Oral)   Resp 18   Ht 1.829 m (6')   Wt 80 kg   SpO2 100%   BMI 23.92 kg/m   Physical Exam Vitals and nursing note reviewed.  Constitutional:      General: He is not in acute distress.     Appearance: Normal appearance. He is well-developed. He is not toxic-appearing.  HENT:     Head: Normocephalic and atraumatic.  Eyes:     General: Lids are normal.     Conjunctiva/sclera: Conjunctivae normal.     Pupils: Pupils are equal, round, and reactive to light.  Neck:     Thyroid: No thyroid mass.     Trachea: No tracheal deviation.  Cardiovascular:     Rate and Rhythm: Bradycardia present. Rhythm irregular.     Heart sounds: Normal heart sounds. No murmur heard.   No gallop.  Pulmonary:     Effort: Pulmonary effort is normal. No respiratory distress.     Breath sounds: Normal breath sounds. No stridor. No decreased breath sounds, wheezing, rhonchi or rales.  Abdominal:     General: There is no distension.     Palpations: Abdomen is soft.     Tenderness: There is no abdominal tenderness. There is no rebound.  Musculoskeletal:        General: No tenderness. Normal range of motion.     Cervical back: Normal range of motion and neck supple.  Skin:    General: Skin is warm and dry.     Findings: No abrasion or rash.  Neurological:     Mental Status: He is alert and oriented to person, place, and time. Mental status is at baseline.     GCS: GCS eye subscore is 4. GCS verbal subscore is 5. GCS motor subscore is 6.     Cranial Nerves: Cranial nerves are intact. No cranial nerve deficit.     Sensory: No sensory deficit.     Motor: Motor function is intact.  Psychiatric:        Attention and Perception: Attention normal.        Speech: Speech normal.        Behavior: Behavior normal.    ED Results / Procedures / Treatments   Labs (all labs ordered are listed, but only abnormal results are displayed) Labs Reviewed  RESP PANEL BY RT-PCR (FLU A&B, COVID) ARPGX2  CBC WITH DIFFERENTIAL/PLATELET  COMPREHENSIVE METABOLIC PANEL  TROPONIN I (HIGH SENSITIVITY)    EKG  EKG Interpretation  Date/Time:  Wednesday September 17 2021 11:41:03 EDT Ventricular Rate:  44 PR Interval:     QRS Duration: 156 QT Interval:  590 QTC Calculation: 505 R Axis:   -78 Text Interpretation: Atrial fibrillation RBBB and LAFB Confirmed by Lacretia Leigh ([id number]) on 09/17/2021 11:46:09 AM  Radiology No results found.  Procedures Procedures   Medications Ordered in ED Medications  0.9 %  sodium chloride infusion (has no administration in time range)    ED Course  I have reviewed the triage vital signs and the nursing notes.  Pertinent labs & imaging results that were available during my care of the patient were reviewed by me and considered in my medical decision making (see chart for details).    MDM Rules/Calculators/A&P                          Patient's bradycardia appears to be from A. fib with slow ventricular rate response.  Patient states that he has been short of breath and weak.  Chest x-ray shows small effusion but BNP is elevated.  Perhaps some CHF component of this.  Did have a 3 g drop in his hemoglobin.  He has brown stools which were guaiac.  COVID test and cardiac work-up negative here.  Patient's potassium is at 5.5.  Given dose of Lokelma here.  Plan will be for admission to the hospital at this time Final Clinical Impression(s) / ED Diagnoses Final diagnoses:  None    Rx / DC Orders ED Discharge Orders     None        Lacretia Leigh, MD 09/17/21 1521

## 2021-09-17 NOTE — Progress Notes (Signed)
Pt admitted to rm 12 from ED. Initiated tele. CHG wipe given. VSS except for slow HR. Pt asymptomatic. Call bell within reach.   Lavenia Atlas, RN

## 2021-09-17 NOTE — H&P (Signed)
History and Physical    Matthew Mahoney:[id number] DOB: July 25, 1936 DOA: 09/17/2021  PCP: [name], MD Consultants:  Nahser - cardiology; Zigmund Daniel - ophthalmology; Risa Grill - urology Patient coming from:  Home - lives alone; [facility]: Sister, [phone]  Chief Complaint: Weakness  HPI: Matthew Mahoney is a 85 y.o. male with medical history significant of ETOH abuse; afib on Eliquis; chronic combined CHF; COPD; CVA; HTN; HLD; prostate CA; and PVD presenting with weakness.  He reports that his R leg - he fell about 2 weeks ago - is all bruised up and not healing well and doesn't look good and it hurts all the time and he is having trouble walking on it.  His legs have been "weak and rubbery" for a while and he does not get along well.  Mild chest pain.  No SOB and he does not wear O2 at home.  He does feel like he is weak and dragging.    ED Course: weakness, ?SOB.  Afib with mild bradycardia, on Eliquis.  CXR unimpressive.  K+ slightly high, given Lokelma.  Review of Systems: As per HPI; otherwise review of systems reviewed and negative.   Ambulatory Status:  Ambulates with a walker at home or a cane when walking outside short distances  COVID Vaccine Status:  Complete plus boosters  Past Medical History:  Diagnosis Date   Alcohol abuse 01/21/2013   Aortic stenosis    Ascites    HISTORY   Atrial fibrillation ([place]) 09/23/2013   CAD (coronary artery disease) 10/11/2013   Choledocholithiasis    Chronic combined systolic and diastolic congestive heart failure ([place]) 05/06/2015   Conduction disorder of the heart 09/25/2013   COPD (chronic obstructive pulmonary disease) (HCC)    Coronary arteriosclerosis    CVA (cerebral infarction) 09/19/2013   Edema of leg    GERD (gastroesophageal reflux disease) 10/11/2013   GI bleed    a. 03/2013 a/w hemorrhagic shock - EGD on 03/1013, which revealed active bleeding from proximal duodenum, with complete hemostasis following epinephrine  injection therapy. At least 2 duodenal ulcers identified.   Hemorrhagic shock ([place]) 03/16/2013   History of colonic diverticulitis    Hyperkalemia    Hyperlipidemia    Hypertension    Hypocalcemia    Ichthyosis    Lymphadenopathy    Mitral regurgitation    Nonsustained ventricular tachycardia 05/09/2015   Olecranon bursitis    Onychomycosis    Prostate cancer ([place])    Status post lung surgery 12/07/2001   BENIGHN LESION ON RIGHT LUNG REMOVED   Thrombocytopenia ([place]) 09/25/2013   UNSPECIFIED PERIPHERAL VASCULAR DISEASE 02/18/2011   Qualifier: Diagnosis of  By: Terald Sleeper     Vitamin D deficiency     Past Surgical History:  Procedure Laterality Date   COLONOSCOPY     ESOPHAGOGASTRODUODENOSCOPY N/A 03/16/2013   Procedure: ESOPHAGOGASTRODUODENOSCOPY (EGD);  Surgeon: Cleotis Nipper, MD;  Location: [facility] ENDOSCOPY;  Service: Endoscopy;  Laterality: N/A;  will probably want to do at bedside in ICU---bed placement pending   FEMUR IM NAIL Left 01/21/2013   Procedure: INTRAMEDULLARY (IM) NAIL FEMORAL;  Surgeon: Hessie Dibble, MD;  [facility]: [facility] ORS;  Service: Orthopedics;  Laterality: Left;   HEMORRHOID SURGERY  12/07/1998   HIP PINNING,CANNULATED Right 05/07/2015   Procedure: CANNULATED HIP PINNING;  Surgeon: Phylliss Bob, MD;  Location: [facility] ORS;  Service: Orthopedics;  Laterality: Right;   LOOP RECORDER IMPLANT N/A 09/22/2013   Procedure: LOOP RECORDER IMPLANT;  Surgeon: Coralyn Mark,  MD;  Location: [facility] CATH LAB;  Service: Cardiovascular;  Laterality: N/A;   LUNG SURGERY  12/07/2001   BENIGN LESION ON RIGHT LUNG REMOVED    Social History   Socioeconomic History   Marital status: Widowed    Spouse name: Not on file   Number of children: 0   Years of education: 12th   Highest education level: Not on file  Occupational History   Occupation: retired  Tobacco Use   Smoking status: Every Day    Packs/day: 1.50    Years: 65.00    Pack years: 97.50    Types: Cigarettes    Smokeless tobacco: Never  Substance and Sexual Activity   Alcohol use: Yes    Comment: 1 1/2 fifths a week, 4 shots/day   Drug use: No   Sexual activity: Not Currently  Other Topics Concern   Not on file  Social History Narrative   Not on file   Social Determinants of Health   Financial Resource Strain: Not on file  Food Insecurity: Not on file  Transportation Needs: Not on file  Physical Activity: Not on file  Stress: Not on file  Social Connections: Not on file  Intimate Partner Violence: Not on file    No Active Allergies   Family History  Problem Relation Age of Onset   Hypertension Mother 12   Hypertension Father 13   Hypertension Sister    Hypertension Brother    Hypertension Sister    Hypertension Brother    Hypertension Brother    Hypertension Brother    Hypertension Brother     Prior to Admission medications   Medication Sig Start Date End Date Taking? Authorizing Provider  acetaminophen (TYLENOL) 325 MG tablet Take 2 tablets (650 mg total) by mouth every 6 (six) hours as needed for mild pain (or Fever >/= 101). 05/09/15   Rama, Venetia Maxon, MD  albuterol (PROVENTIL HFA;VENTOLIN HFA) 108 (90 BASE) MCG/ACT inhaler Inhale 1 puff into the lungs every 6 (six) hours as needed for wheezing or shortness of breath.    [provider]  alendronate (FOSAMAX) 70 MG tablet Take 1 tablet by mouth once a week. 08/23/18   [provider]  amLODipine (NORVASC) 5 MG tablet Take 1 tablet by mouth daily.    [provider]  apixaban (ELIQUIS) 2.5 MG TABS tablet Take 1 tablet (2.5 mg total) by mouth 2 (two) times daily. 09/25/13   Velvet Bathe, MD  atorvastatin (LIPITOR) 40 MG tablet Take 40 mg by mouth daily.    [provider]  bicalutamide (CASODEX) 50 MG tablet Take 50 mg by mouth daily.    [provider]  Cholecalciferol (VITAMIN D-1000 MAX ST) 25 MCG (1000 UT) tablet Take 1 tablet by mouth daily.    [provider]   diphenhydramine-acetaminophen (TYLENOL PM) 25-500 MG TABS tablet Take 1 tablet by mouth at bedtime as needed.    [provider]  Fluticasone-Umeclidin-Vilant (TRELEGY ELLIPTA) 100-62.5-25 MCG/INH AEPB Inhale 1 puff into the lungs daily.    [provider]  furosemide (LASIX) 20 MG tablet Take 1 tablet by mouth 2 (two) times daily. 05/05/13   [provider]  losartan (COZAAR) 100 MG tablet Take 100 mg by mouth daily.    [provider]  magnesium gluconate (MAGONATE) 500 MG tablet Take 500 mg by mouth daily.    [provider]  Multiple Vitamins-Minerals (PRESERVISION AREDS PO) Take 1 tablet by mouth 2 (two) times daily.  [provider]  omega-3 acid ethyl esters (LOVAZA) 1 G capsule Take 1 g by mouth daily.     [provider]  pantoprazole (PROTONIX) 40 MG tablet Take 40 mg by mouth daily.    [provider]  thiamine 100 MG tablet Take 100 mg by mouth daily.    [provider]  Travoprost, BAK Free, (TRAVATAN Z) 0.004 % SOLN ophthalmic solution Place 1 drop into both eyes at bedtime.    [provider]    Physical Exam: Vitals:   09/17/21 1515 09/17/21 1615 09/17/21 1816 09/17/21 1846  BP: (!) 109/57 120/60  120/71  Pulse: (!) 54 (!) 52  (!) 46  Resp: (!) 23 14  20   Temp:   (!) 97.3 F (36.3 C) 97.6 F (36.4 C)  TempSrc:   Oral Oral  SpO2: 95% 99%  96%  Weight:      Height:         General:  Appears calm and comfortable and is in NAD, chronically ill appearing Eyes:  EOMI, normal lids, iris ENT:  grossly normal hearing, lips & tongue, mmm Neck:  no LAD, masses or thyromegaly Cardiovascular:  Bradycardia in 40s range. No LE edema.  Respiratory:   CTA bilaterally with no wheezes/rales/rhonchi.  Normal respiratory effort. Abdomen:  soft, NT, ND Skin:  R knee abrasion; R lateral lower leg with significant excoriation and foul-smelling drainage with surrounding  erythema      Musculoskeletal:  decreased tone BUE < BLE, no bony abnormality Psychiatric:  blunted mood and affect, speech fluent and appropriate, AOx3 Neurologic:  CN 2-12 grossly intact, moves all extremities in coordinated fashion    Radiological Exams on Admission: Independently reviewed - see discussion in A/P where applicable  DG Chest Port 1 View  Result Date: 09/17/2021 CLINICAL DATA:  Shortness of breath, weakness, hypotension EXAM: PORTABLE CHEST 1 VIEW COMPARISON:  07/22/2021 FINDINGS: Overlying cardiac leads. Mild cardiomegaly, unchanged. Aortic atherosclerosis. Small right and possible trace left pleural effusion. Minimal streaky bibasilar opacities. No pneumothorax. IMPRESSION: Small right and possible trace left pleural effusion with probable mild bibasilar atelectasis. Electronically Signed   By: Davina Poke D.O.   On: 09/17/2021 12:48    EKG: Independently reviewed.  Afib with rate 44; RBBB and LAFB   Labs on Admission: I have personally reviewed the available labs and imaging studies at the time of the admission.  Pertinent labs:   K+ 5.5 BUN 56/Creatinine 1.99/GFR 33 - stable BNP 353.6 HS troponin 10, 5 WBC 7.4 Hgb 9.4 Platelets 121 COVID/flu negative Heme negative   Assessment/Plan Principal Problem:   Symptomatic bradycardia Active Problems:   Peripheral vascular disease (HCC)   Hypertension   COPD (chronic obstructive pulmonary disease) (HCC)   Hyperlipidemia   Alcohol abuse   Tobacco abuse   Atrial fibrillation (HCC)   Chronic combined systolic and diastolic congestive heart failure (HCC)   Ambulatory dysfunction   Abrasion of lower leg with infection    Symptomatic bradycardia -Afib with rate lability  -Pacer pads were placed upon arrival but have since been removed -TSH is pending  -Consult to cardiology/EP -Will admit to telemetry, holding beta blocker -Patient may require a pacer and would be in agreement with this  plan  Ambulatory dysfunction -Patient with progressively decreased ability to ambulate -Suspect progressive symptomatic bradycardia as the cause -He fell and injured his RLE which likely further contributed -Will need PT/OT consults once no longer with bradycardia  LE wound infection -He has a  h/o PVD and fell prior with ongoing poor wound healing -Leg ulcer is present and draining and now the patient has developed surrounding cellulitis -Normal WBC, no current concerns for sepsis -Will treat with IV antibiotics (Rocephin and Flagyl as per the lower extremity wound infection algorithm) -I have ordered ABIs in case revascularization may be indicated -LE wound order set utilized including consults (TOC team; wound care; and nutrition)   ETOH abuse -Patient with chronic ETOH dependence -Denies history of withdrawal seizures, DTs -CIWA protocol -Folate, thiamine, and MVI ordered -Will provide symptom-triggered BZD (ativan per CIWA protocol) only since the patient is able to communicate; is not showing current signs of delirium; and has no history of severe withdrawal.  Afib -Continue Eliquis -Previously rate controlled with Coreg - now holding due to bradycardia  Chronic combined CHF -Hold Coreg -Continue Entresto  COPD with ongoing tobacco dependence -Continue Albuterol, Trelegy -Tobacco Dependence: encourage cessation.   -Patch ordered   HTN -Continue Norvasc -Holding Coreg  HLD -Continue Lipitor -Hold Lovaza due to limited inpatient utility  Glaucoma -Continue Travatan    Note: This patient has been tested and is negative for the novel coronavirus COVID-19. The patient has been fully vaccinated against COVID-19.   Level of care: Telemetry Cardiac DVT prophylaxis: Eliquis Code Status:  Full - confirmed with patient/family Family Communication: Sister was present throughout evaluation. Disposition Plan:  The patient is from: home  Anticipated d/c is to: home  without [facility] services   Anticipated d/c date will depend on clinical response to treatment, but possibly as early as tomorrow if she has excellent response to treatment  Patient is currently: acutely ill Consults called: Cardiology; Nutrition; TOC team; Wound care; will need PT/OT  Admission status:  Admit - It is my clinical opinion that admission to INPATIENT is reasonable and necessary because of the expectation that this patient will require hospital care that crosses at least 2 midnights to treat this condition based on the medical complexity of the problems presented.  Given the aforementioned information, the predictability of an adverse outcome is felt to be significant.    Karmen Bongo MD [facility]   How to contact the [facility] Attending or Consulting provider [place] or covering provider during after hours [place], for this patient?  Check the care team in [facility] and look for a) attending/consulting [facility] provider listed and b) the [facility] team listed Log into [email or web address] and use [place]'s universal password to access. If you do not have the password, please contact the hospital operator. Locate the [facility] provider you are looking for under [facility] and page to a number that you can be directly reached. If you still have difficulty reaching the provider, please page the [facility] (Director on Call) for the Hospitalists listed on amion for assistance.   09/17/2021, 6:56 PM

## 2021-09-18 ENCOUNTER — Encounter (HOSPITAL_COMMUNITY): Payer: Self-pay | Admitting: Internal Medicine

## 2021-09-18 ENCOUNTER — Inpatient Hospital Stay (HOSPITAL_COMMUNITY): Payer: Medicare Other

## 2021-09-18 DIAGNOSIS — L0390 Cellulitis, unspecified: Secondary | ICD-10-CM | POA: Diagnosis not present

## 2021-09-18 DIAGNOSIS — E43 Unspecified severe protein-calorie malnutrition: Secondary | ICD-10-CM | POA: Insufficient documentation

## 2021-09-18 DIAGNOSIS — L8990 Pressure ulcer of unspecified site, unspecified stage: Secondary | ICD-10-CM | POA: Insufficient documentation

## 2021-09-18 DIAGNOSIS — R001 Bradycardia, unspecified: Secondary | ICD-10-CM | POA: Diagnosis not present

## 2021-09-18 LAB — CBC
HCT: 26.1 % — ABNORMAL LOW (ref 39.0–52.0)
Hemoglobin: 8.7 g/dL — ABNORMAL LOW (ref 13.0–17.0)
MCH: 31.3 pg (ref 26.0–34.0)
MCHC: 33.3 g/dL (ref 30.0–36.0)
MCV: 93.9 fL (ref 80.0–100.0)
Platelets: 112 10*3/uL — ABNORMAL LOW (ref 150–400)
RBC: 2.78 MIL/uL — ABNORMAL LOW (ref 4.22–5.81)
RDW: 15.3 % (ref 11.5–15.5)
WBC: 7.8 10*3/uL (ref 4.0–10.5)
nRBC: 0 % (ref 0.0–0.2)

## 2021-09-18 LAB — BASIC METABOLIC PANEL
Anion gap: 11 (ref 5–15)
BUN: 39 mg/dL — ABNORMAL HIGH (ref 8–23)
CO2: 14 mmol/L — ABNORMAL LOW (ref 22–32)
Calcium: 7.3 mg/dL — ABNORMAL LOW (ref 8.9–10.3)
Chloride: 108 mmol/L (ref 98–111)
Creatinine, Ser: 1.18 mg/dL (ref 0.61–1.24)
GFR, Estimated: >60 mL/min (ref >60)
Glucose, Bld: 83 mg/dL (ref 70–99)
Potassium: 4.4 mmol/L (ref 3.5–5.1)
Sodium: 133 mmol/L — ABNORMAL LOW (ref 135–145)

## 2021-09-18 MED ORDER — ENSURE ENLIVE PO LIQD
237.0000 mL | Freq: Two times a day (BID) | ORAL | Status: DC
  Administered 2021-09-18 – 2021-09-24 (×11): 237 mL via ORAL

## 2021-09-18 MED ORDER — JUVEN PO PACK
1.0000 | PACK | Freq: Two times a day (BID) | ORAL | Status: DC
  Administered 2021-09-18 – 2021-09-24 (×10): 1 via ORAL
  Filled 2021-09-18 (×13): qty 1

## 2021-09-18 MED ORDER — APIXABAN 2.5 MG PO TABS
2.5000 mg | ORAL_TABLET | Freq: Two times a day (BID) | ORAL | Status: DC
  Administered 2021-09-18 – 2021-09-20 (×4): 2.5 mg via ORAL
  Filled 2021-09-18 (×4): qty 1

## 2021-09-18 NOTE — Progress Notes (Signed)
Initial Nutrition Assessment  DOCUMENTATION CODES:   Severe malnutrition in context of chronic illness  INTERVENTION:   - Liberalize diet to Regular, verbal with readback order placed per MD  - Ensure Enlive po BID, each supplement provides 350 kcal and 20 grams of protein  - 1 packet Juven BID, each packet provides 95 calories, 2.5 grams of protein, and 9.8 grams of carbohydrate; also contains L-arginine and L-glutamine, vitamin C, vitamin E, vitamin B-12, zinc, calcium, and calcium Beta-hydroxy-Beta-methylbutyrate to support wound healing  - MVI with minerals daily  - Encourage PO intake  NUTRITION DIAGNOSIS:   Severe Malnutrition related to chronic illness (CHF, COPD) as evidenced by severe fat depletion, severe muscle depletion.  GOAL:   Patient will meet greater than or equal to 90% of their needs  MONITOR:   PO intake, Supplement acceptance, Labs, Weight trends, I & O's, Skin  REASON FOR ASSESSMENT:   Consult Wound healing  ASSESSMENT:   85 year old male who presented to the ED on 10/12 with bradycardia, weakness. PMH of EtOH abuse, atrial fibrillation, CHF, COPD, CVA, HTN, HLD, prostate cancer, PVD, GERD, vitamin D deficiency.  Spoke with pt at bedside. Pt reports that his appetite is good. He states that he hasn't had anything to eat yet since being hospitalized but noted 100% meal completion documented for breakfast today. However, pt with untouched breakfast meal tray in front of him and reports that he hasn't received assistance in sitting up in bed so that he could get positioned properly to eat. RD assisted pt in getting set up to eat breakfast.  Pt shares that at home he has a good appetite and "tries" to eat 3 meals daily. He reports that he does not cook much at home but does get takeout/go out to get something to eat. Pt reports that breakfast typically includes 2 "chop biscuits." Lunch and dinner typically include a cheeseburger or something similar. Pt  states that he drinks water, coffee, coke, and "very little" alcohol. When asked to expand on alcohol intake, pt states that he consumes 1 mixed drink (consisting of 1 shot of liquor) daily.  Pt endorses weight loss. He reports that he used to weigh 200 lbs prior to losing weight. He reports that he is losing weight because food is so expensive and he is not eating as much as he used to. Pt shares that he now weighs 160 lbs. He believes that his weight loss started 1 year ago. Weight history in chart is limited as last available weight is from December 2019 (88.5 kg). Current weight of 80 kg indicates that pt has experienced weight loss but unsure of timeframe. Pt with mild pitting edema to BLE so true dry weight is unknown. Based on NFPE, pt meets criteria for malnutrition.  Pt willing to consume oral nutrition supplements. RD to order. Discussed diet liberalization with MD who agreed. Verbal with readback order placed for Regular diet.  Meal Completion: 100% x 1 documented meal  Medications reviewed and include: colace, folic acid, MVI with minerals, protonix, thiamine, IV abx IVF: LR @ 75 ml/hr  Labs reviewed: sodium 133, BUN 39, hemoglobin 8.7  UOP: 225 ml x 12 hours  NUTRITION - FOCUSED PHYSICAL EXAM:  Flowsheet Row Most Recent Value  Orbital Region Severe depletion  Upper Arm Region Severe depletion  Thoracic and Lumbar Region Severe depletion  Buccal Region Moderate depletion  Temple Region Moderate depletion  Clavicle Bone Region Severe depletion  Clavicle and Acromion Bone Region Severe depletion  Scapular Bone Region Moderate depletion  Dorsal Hand Moderate depletion  Patellar Region Moderate depletion  Anterior Thigh Region Severe depletion  Posterior Calf Region Moderate depletion  Edema (RD Assessment) Mild  [BLE]  Hair Reviewed  Eyes Reviewed  Mouth Reviewed  Skin Reviewed  Nails Reviewed       Diet Order:   Diet Order             Diet regular Room service  appropriate? Yes; Fluid consistency: Thin  Diet effective now                   EDUCATION NEEDS:   Education needs have been addressed  Skin:  Skin Assessment: Skin Integrity Issues: Stage II: buttocks Other: cellulitis to BLE, venous stasis ulcer to R leg x 2, laceration to R knee, skin tear to R knee  Last BM:  09/17/21  Height:   Ht Readings from Last 1 Encounters:  09/17/21 6' (1.829 m)    Weight:   Wt Readings from Last 1 Encounters:  09/17/21 80 kg    BMI:  Body mass index is 23.92 kg/m.  Estimated Nutritional Needs:   Kcal:  2000-2200  Protein:  110-130 grams  Fluid:  >/= 1.8 L    Gustavus Bryant, MS, RD, LDN Inpatient Clinical Dietitian Please see AMiON for contact information.

## 2021-09-18 NOTE — Progress Notes (Signed)
Paged on call MD Dr. Marlowe Sax with critical HGB 6.5. also updated MD that, 10/12 Hbg was 9.4, fecal occult blood was negative. pt does not have any active bleeding visible. See MAR for new orders.

## 2021-09-18 NOTE — Progress Notes (Signed)
Mobility Specialist Progress Note:   09/18/21 1310  Therapy Vitals  Pulse Rate 64  Mobility  Activity Ambulated in room  Level of Assistance Moderate assist, patient does 50-74%  Assistive Device Front wheel walker  Distance Ambulated (ft) 25 ft  Mobility Ambulated with assistance in room  Mobility Response Tolerated fair  Mobility performed by Mobility specialist  $Mobility charge 1 Mobility   Pre Mobility: HR 64 bpm Post Mobility: HR 64 bpm  Pt recevied sitting EOB, agreed to mobility.  Required ModA to stand from EOB. Ambulated to/from BR (25') with RW and min A. Required ModA for pericare. Pt left in bed with all needs met.   Nelta Numbers Mobility Specialist  Phone [phone]

## 2021-09-18 NOTE — Plan of Care (Signed)

## 2021-09-18 NOTE — Progress Notes (Signed)
PROGRESS NOTE    Matthew Mahoney  FYB:[id number] DOB: August 23, 1936 DOA: 09/17/2021 PCP: [name], MD   Brief Narrative:  Matthew Mahoney is a 85 y.o. male with medical history significant of ETOH abuse; afib on Eliquis; chronic combined CHF; COPD; CVA; HTN; HLD; prostate CA; and PVD presenting with weakness.  He reports that his R leg - he fell about 2 weeks ago - is all bruised up and not healing well and doesn't look good and it hurts all the time and he is having trouble walking on it.  His legs have been "weak and rubbery" for a while and he does not get along well.  Mild chest pain.  No SOB and he does not wear O2 at home.  He does feel like he is weak and dragging. [facility] called to admit.  Assessment & Plan:   Symptomatic bradycardia -Afib with rate notably in the 40s and 50s at intake and somewhat ongoing -Pacer pads previously placed and removed but noted at bedside again today still on patient's chest -TSH within normal limits -Cardiology consulted at intake, appreciate insight recommendations, we will continue to hold beta-blocker, if patient's heart rate improves would likely be reasonable for further outpatient follow-up however if bradycardia, most notably if he remains symptomatic, it is ongoing would appreciate cardiology's assistance for medication management or pacemaker evaluation  Ambulatory dysfunction -Patient with progressively decreased ability to ambulate likely in the setting of progressive bradycardia -Patient reports multiple falls, most notably he has a high step in his garage he continually trips and falls over with lower extremity cellulitis and skin wound as below -PT OT to follow recommendations, patient wishes to return home if at all possible   LE wound infection/cellulitis -Multiple falls over the same step with multiple injuries at the same site on his leg multiple falls ongoing poor wound healing -Leg ulcer is present and draining and now the  patient has developed surrounding cellulitis -Normal WBC, no current concerns for sepsis -Continue IV antibiotics in the interim with ceftriaxone and Flagyl  -likely de-escalate to p.o. antibiotics in the next 24 to 48 hours pending improvement in pain symptoms and erythema    ETOH abuse, without signs or symptoms of withdrawal -Patient with chronic ETOH dependence -Denies history of withdrawal seizures, DTs -CIWA protocol ongoing, asymptomatic at this time -Folate, thiamine, and MVI ordered -Will provide symptom-triggered BZD (ativan per CIWA protocol) only since the patient is able to communicate; is not showing current signs of delirium; and has no history of severe withdrawal.   Afib -CHA2DS2-VASc of 6  -Eliquis held overnight in the setting of presumed acute on chronic anemia which appears to be a lab error - FOBT negative - Resume eliquis per cardiology discussion - no tentative plan for procedure at this time. -Currently on low-dose Eliquis 2.5 twice daily due to age of 38 and baseline creatinine greater than 1.5, creatinine currently 1.18, will hold off on transitioning to 5 mg twice daily given no recent labs over the past 5 years but previously baseline appears to be around 1.7-1.8 - Previously rate controlled with Coreg - now holding due to bradycardia   Chronic anemia, likely of chronic disease -Baseline previously around 12, currently 9 downtrending mildly to 8 with IV fluids somewhat dilutional - interim labs overnight show Hgb of 6.5 but were deleted due to being considered erroneous. -FOBT negative, continue to follow clinically  Chronic combined CHF -Hold Coreg -Continue Entresto   COPD with ongoing tobacco  dependence -Continue Albuterol, Trelegy -Tobacco Dependence: encourage cessation.   -Patch ordered    HTN -Continue Norvasc -Holding Coreg   HLD -Continue Lipitor -Hold Lovaza due to limited inpatient utility   Glaucoma -Continue Travatan  DVT  prophylaxis: Eliquis, currently on low-dose twice dail Code Status: Full Family Communication: None available  Status is: Inpatient  Dispo: The patient is from: Home              Anticipated d/c is to: Home              Anticipated d/c date is: 24 to 48 hours pending clinical course              Patient currently not medically stable for discharge, continues to require further work-up and monitoring given symptomatic bradycardia ambulatory dysfunction and falls  Consultants:  Cardiology  Procedures:  None  Antimicrobials:  Ceftriaxone, Flagyl 09/17/2021, ongoing  Subjective: No acute issues or events overnight denies nausea vomiting diarrhea constipation headache fevers chills chest pain or shortness of breath.  Patient looking forward to ambulating with PT as he would like to discharge home if reasonably.  Objective: Vitals:   09/17/21 2102 09/17/21 2314 09/18/21 0455 09/18/21 0748  BP: (!) 100/51 (!) 113/50 (!) 113/53 124/62  Pulse: (!) 50 (!) 53 (!) 51 (!) 52  Resp: 20 20 16 17   Temp:  97.7 F (36.5 C) 97.6 F (36.4 C) (!) 97.3 F (36.3 C)  TempSrc:  Oral Oral Oral  SpO2: 96% 99% 94% 98%  Weight:      Height:        Intake/Output Summary (Last 24 hours) at 09/18/2021 0817 Last data filed at 09/18/2021 0300 Gross per 24 hour  Intake 696.23 ml  Output 225 ml  Net 471.23 ml   Filed Weights   09/17/21 1141  Weight: 80 kg    Examination:  General:  Pleasantly resting in bed, No acute distress. HEENT:  Normocephalic atraumatic.  Sclerae nonicteric, noninjected.  Extraocular movements intact bilaterally. Neck:  Without mass or deformity.  Trachea is midline. Lungs:  Clear to auscultate bilaterally without rhonchi, wheeze, or rales. Heart: Irregularly irregular rate in the 50s.  Without murmurs, rubs, or gallops. Abdomen:  Soft, nontender, nondistended.  Without guarding or rebound. Extremities: Without cyanosis, clubbing, edema, or obvious deformity. Vascular:   Dorsalis pedis and posterior tibial pulses palpable bilaterally. Skin: As below superficial excoriation, blanching erythema without overt purulence.     Data Reviewed: I have personally reviewed following labs and imaging studies  CBC: Recent Labs  Lab 09/17/21 1149 09/18/21 0222 09/18/21 0432  WBC 7.4 QUESTIONABLE RESULTS, RECOMMEND RECOLLECT TO VERIFY 7.8  NEUTROABS 5.0  --   --   HGB 9.4* QUESTIONABLE RESULTS, RECOMMEND RECOLLECT TO VERIFY 8.7*  HCT 29.3* QUESTIONABLE RESULTS, RECOMMEND RECOLLECT TO VERIFY 26.1*  MCV 97.7 QUESTIONABLE RESULTS, RECOMMEND RECOLLECT TO VERIFY 93.9  PLT 121* QUESTIONABLE RESULTS, RECOMMEND RECOLLECT TO VERIFY 161*   Basic Metabolic Panel: Recent Labs  Lab 09/17/21 1149 09/18/21 0222  NA 136 133*  K 5.5* 4.4  CL 107 108  CO2 19* 14*  GLUCOSE 101* 83  BUN 56* 39*  CREATININE 1.99* 1.18  CALCIUM 8.3* 7.3*   GFR: Estimated Creatinine Clearance: 52.1 mL/min (by C-G formula based on SCr of 1.18 mg/dL). Liver Function Tests: Recent Labs  Lab 09/17/21 1149  AST 13*  ALT 8  ALKPHOS 67  BILITOT 1.0  PROT 5.5*  ALBUMIN 2.8*   No results for input(s): LIPASE,  AMYLASE in the last 168 hours. No results for input(s): AMMONIA in the last 168 hours. Coagulation Profile: No results for input(s): INR, PROTIME in the last 168 hours. Cardiac Enzymes: No results for input(s): CKTOTAL, CKMB, CKMBINDEX, TROPONINI in the last 168 hours. BNP (last 3 results) No results for input(s): PROBNP in the last 8760 hours. HbA1C: No results for input(s): HGBA1C in the last 72 hours. CBG: No results for input(s): GLUCAP in the last 168 hours. Lipid Profile: No results for input(s): CHOL, HDL, LDLCALC, TRIG, CHOLHDL, LDLDIRECT in the last 72 hours. Thyroid Function Tests: Recent Labs    09/17/21 1848  TSH 2.224   Anemia Panel: No results for input(s): VITAMINB12, FOLATE, FERRITIN, TIBC, IRON, RETICCTPCT in the last 72 hours. Sepsis Labs: No results  for input(s): PROCALCITON, LATICACIDVEN in the last 168 hours.  Recent Results (from the past 240 hour(s))  Resp Panel by RT-PCR (Flu A&B, Covid) Nasopharyngeal Swab     Status: None   Collection Time: 09/17/21 11:50 AM   Specimen: Nasopharyngeal Swab; Nasopharyngeal(NP) swabs in vial transport medium  Result Value Ref Range Status   SARS Coronavirus 2 by RT PCR NEGATIVE NEGATIVE Final    Comment: (NOTE) SARS-CoV-2 target nucleic acids are NOT DETECTED.  The SARS-CoV-2 RNA is generally detectable in upper respiratory specimens during the acute phase of infection. The lowest concentration of SARS-CoV-2 viral copies this assay can detect is 138 copies/mL. A negative result does not preclude SARS-Cov-2 infection and should not be used as the sole basis for treatment or other patient management decisions. A negative result may occur with  improper specimen collection/handling, submission of specimen other than nasopharyngeal swab, presence of viral mutation(s) within the areas targeted by this assay, and inadequate number of viral copies(<138 copies/mL). A negative result must be combined with clinical observations, patient history, and epidemiological information. The expected result is Negative.  Fact Sheet for Patients:  EntrepreneurPulse.com.au  Fact Sheet for Healthcare Providers:  IncredibleEmployment.be  This test is no t yet approved or cleared by the [place] FDA and  has been authorized for detection and/or diagnosis of SARS-CoV-2 by FDA under an Emergency Use Authorization (EUA). This EUA will remain  in effect (meaning this test can be used) for the duration of the COVID-19 declaration under Section 564(b)(1) of the Act, 21 U.S.C.section 360bbb-3(b)(1), unless the authorization is terminated  or revoked sooner.       Influenza A by PCR NEGATIVE NEGATIVE Final   Influenza B by PCR NEGATIVE NEGATIVE Final    Comment: (NOTE) The  Xpert Xpress SARS-CoV-2/FLU/RSV plus assay is intended as an aid in the diagnosis of influenza from Nasopharyngeal swab specimens and should not be used as a sole basis for treatment. Nasal washings and aspirates are unacceptable for Xpert Xpress SARS-CoV-2/FLU/RSV testing.  Fact Sheet for Patients: EntrepreneurPulse.com.au  Fact Sheet for Healthcare Providers: IncredibleEmployment.be  This test is not yet approved or cleared by the [place] FDA and has been authorized for detection and/or diagnosis of SARS-CoV-2 by FDA under an Emergency Use Authorization (EUA). This EUA will remain in effect (meaning this test can be used) for the duration of the COVID-19 declaration under Section 564(b)(1) of the Act, 21 U.S.C. section 360bbb-3(b)(1), unless the authorization is terminated or revoked.  Performed at [facility], [place] [address] [place], [place] [address]          Radiology Studies: DG Chest Port 1 View  Result Date: 09/17/2021 CLINICAL DATA:  Shortness of  breath, weakness, hypotension EXAM: PORTABLE CHEST 1 VIEW COMPARISON:  07/22/2021 FINDINGS: Overlying cardiac leads. Mild cardiomegaly, unchanged. Aortic atherosclerosis. Small right and possible trace left pleural effusion. Minimal streaky bibasilar opacities. No pneumothorax. IMPRESSION: Small right and possible trace left pleural effusion with probable mild bibasilar atelectasis. Electronically Signed   By: Davina Poke D.O.   On: 09/17/2021 12:48        Scheduled Meds:  amLODipine  2.5 mg Oral Daily   atorvastatin  40 mg Oral QPM   docusate sodium  100 mg Oral BID   fluticasone furoate-vilanterol  1 puff Inhalation Daily   folic acid  1 mg Oral Daily   latanoprost  1 drop Both Eyes QHS   multivitamin with minerals  1 tablet Oral Daily   nicotine  14 mg Transdermal Daily   pantoprazole  40 mg Oral Daily   sacubitril-valsartan  1 tablet Oral BID   sodium  chloride flush  3 mL Intravenous Q12H   thiamine  100 mg Oral Daily   Or   thiamine  100 mg Intravenous Daily   umeclidinium bromide  1 puff Inhalation Daily   Continuous Infusions:  cefTRIAXone (ROCEPHIN)  IV 2 g (09/17/21 2105)   And   metronidazole 500 mg (09/17/21 2104)   lactated ringers 75 mL/hr at 09/17/21 2055     LOS: 1 day    Time spent: 42min   Kadyn Guild C Kelsye Loomer, DO [facility]  If 7PM-7AM, please contact night-coverage [email or web address]  09/18/2021, 8:17 AM

## 2021-09-18 NOTE — Progress Notes (Signed)
Text paged DR. V Rathore and notified re-check Hgb is 8.7. will continue to monitor.

## 2021-09-18 NOTE — Progress Notes (Signed)
VASCULAR LAB    ABIs have been performed.  See CV proc for preliminary results.   Shareena Nusz, RVT 09/18/2021, 3:55 PM

## 2021-09-18 NOTE — Consult Note (Addendum)
Cardiology Consultation:   Patient ID: Matthew Mahoney MRN: [id number]; DOB: [birth year]  Admit date: 09/17/2021 Date of Consult: 09/18/2021  PCP:  Matthew Bien, MD   [facility] Providers Cardiologist:  Mertie Moores, MD      [place] Cardiologist: Ancil Linsey, MD  Patient Profile:   Matthew Mahoney is a 85 y.o. male with a hx of remote stents (former [facility] Cardiology pt, last stent Dr. Mancel Bale at [place] 3 mm x 13 mm drug-eluting stent was placed in his LAD and an Angio-Seal was used for femoral hemostasis, no further info available), S-D-CHF, PVD, atrial fibrillation, CVA 2014 w/ EF 20-25% >> 50-55% 2016, ?Aortic stenosis, GIB w/ shock, prostate CA, LE edema on amlodipine, who is being seen 09/18/2021 for the evaluation of bradycardia, at the request of Dr Avon Gully.  History of Present Illness:   Matthew Mahoney was admitted 10/12 for weakness, mild CP. ECG w/ atrial fib, HR initially 40s. K+ 5.5, Hgb 9.4, BNP 353, Trop 10. LE pain after a fall 2 weeks ago, large ulcer noted. Cards asked to see for bradycardia.  Matthew Mahoney lives alone.  He has family that checks on him regularly.   He has fallen several times, for various reasons. He fell on the ice, he fell coming out of his house because there is a high step, he fell because the power was out and he missed the bed when he tried to sit down on it.   He fell going down the step because it was high and his legs gave out. They give out occasionally, he uses a walker to get around. His legs have been weak since 2015, when he broke his hip (also a mechanical fall). The weakness may have gradually gotten worse, but has been there for years.   He got a RLE wound from that fall that was a large skin tear, according to family. They have been treating it with OTC meds and bandages, it is smaller than it was initially, had been improving. No fevers or chills, only clear drainage from the wound.   Because of leg weakness and  using a walker, his activity level is very low.   He does not get chest pain.  He does not think he gets light-headed or dizzy.  He smokes 2 ppd and has no desire to slow down or quit.   He wishes to be a full code.   He does not have a POA, family was given paperwork for this.   It was strongly suggested that he get a ramp, going up/down steps with a walker is a recipe for disaster.    Past Medical History:  Diagnosis Date   Alcohol abuse 01/21/2013   Aortic stenosis    Ascites    HISTORY   Atrial fibrillation ([place]) 09/23/2013   CAD (coronary artery disease) 10/11/2013   Choledocholithiasis    Chronic combined systolic and diastolic congestive heart failure ([place]) 05/06/2015   Conduction disorder of the heart 09/25/2013   COPD (chronic obstructive pulmonary disease) (HCC)    Coronary arteriosclerosis    CVA (cerebral infarction) 09/19/2013   Edema of leg    GERD (gastroesophageal reflux disease) 10/11/2013   GI bleed    a. 03/2013 a/w hemorrhagic shock - EGD on 03/1013, which revealed active bleeding from proximal duodenum, with complete hemostasis following epinephrine injection therapy. At least 2 duodenal ulcers identified.   Hemorrhagic shock ([place]) 03/16/2013   History of colonic diverticulitis    Hyperkalemia  Hyperlipidemia    Hypertension    Hypocalcemia    Ichthyosis    Lymphadenopathy    Mitral regurgitation    Nonsustained ventricular tachycardia 05/09/2015   Olecranon bursitis    Onychomycosis    Prostate cancer ([place])    Status post lung surgery 12/07/2001   BENIGHN LESION ON RIGHT LUNG REMOVED   Thrombocytopenia ([place]) 09/25/2013   UNSPECIFIED PERIPHERAL VASCULAR DISEASE 02/18/2011   Qualifier: Diagnosis of  By: Terald Sleeper     Vitamin D deficiency     Past Surgical History:  Procedure Laterality Date   COLONOSCOPY     ESOPHAGOGASTRODUODENOSCOPY N/A 03/16/2013   Procedure: ESOPHAGOGASTRODUODENOSCOPY (EGD);  Surgeon: Cleotis Nipper, MD;   [facility]: [facility] ENDOSCOPY;  Service: Endoscopy;  Laterality: N/A;  will probably want to do at bedside in ICU---bed placement pending   FEMUR IM NAIL Left 01/21/2013   Procedure: INTRAMEDULLARY (IM) NAIL FEMORAL;  Surgeon: Hessie Dibble, MD;  Location: [facility] ORS;  Service: Orthopedics;  Laterality: Left;   HEMORRHOID SURGERY  12/07/1998   HIP PINNING,CANNULATED Right 05/07/2015   Procedure: CANNULATED HIP PINNING;  Surgeon: Phylliss Bob, MD;  Location: [facility] ORS;  Service: Orthopedics;  Laterality: Right;   LOOP RECORDER IMPLANT N/A 09/22/2013   Procedure: LOOP RECORDER IMPLANT;  Surgeon: Coralyn Mark, MD;  Location: [facility] CATH LAB;  Service: Cardiovascular;  Laterality: N/A;   LUNG SURGERY  12/07/2001   BENIGN LESION ON RIGHT LUNG REMOVED     Home Medications:  Prior to Admission medications   Medication Sig Start Date End Date Taking? Authorizing Provider  albuterol (PROVENTIL HFA;VENTOLIN HFA) 108 (90 BASE) MCG/ACT inhaler Inhale 1 puff into the lungs every 6 (six) hours as needed for wheezing or shortness of breath.   Yes [provider]  alendronate (FOSAMAX) 70 MG tablet Take 1 tablet by mouth once a week. 08/23/18  Yes [provider]  amLODipine (NORVASC) 2.5 MG tablet Take 2.5 mg by mouth daily.   Yes [provider]  apixaban (ELIQUIS) 2.5 MG TABS tablet Take 1 tablet (2.5 mg total) by mouth 2 (two) times daily. 09/25/13  Yes Velvet Bathe, MD  atorvastatin (LIPITOR) 40 MG tablet Take 40 mg by mouth every evening.   Yes [provider]  carvedilol (COREG) 12.5 MG tablet Take 12.5 mg by mouth 2 (two) times daily with a meal.   Yes [provider]  Cholecalciferol 25 MCG (1000 UT) tablet Take 1 tablet by mouth daily.   Yes [provider]  docusate sodium (COLACE) 100 MG capsule Take 200 mg by mouth daily as needed for mild constipation.   Yes [provider]  Fluticasone-Umeclidin-Vilant 100-62.5-25 MCG/INH AEPB Inhale 1 puff into the  lungs daily.   Yes [provider]  Multiple Vitamins-Minerals (PRESERVISION AREDS PO) Take 1 tablet by mouth 2 (two) times daily.   Yes [provider]  Naproxen Sod-diphenhydrAMINE (ALEVE PM) 220-25 MG TABS Take 1 tablet by mouth at bedtime.   Yes [provider]  omega-3 acid ethyl esters (LOVAZA) 1 G capsule Take 1 g by mouth 2 (two) times daily.   Yes [provider]  pantoprazole (PROTONIX) 40 MG tablet Take 40 mg by mouth daily.   Yes [provider]  sacubitril-valsartan (ENTRESTO) 49-51 MG Take 1 tablet by mouth 2 (two) times daily.   Yes [provider]  thiamine 100 MG tablet Take 100 mg by mouth daily.   Yes [provider]  Travoprost, BAK Free, (TRAVATAN) 0.004 %  SOLN ophthalmic solution Place 1 drop into both eyes at bedtime.   Yes [provider]    Inpatient Medications: Scheduled Meds:  amLODipine  2.5 mg Oral Daily   atorvastatin  40 mg Oral QPM   docusate sodium  100 mg Oral BID   feeding supplement  237 mL Oral BID BM   fluticasone furoate-vilanterol  1 puff Inhalation Daily   folic acid  1 mg Oral Daily   latanoprost  1 drop Both Eyes QHS   multivitamin with minerals  1 tablet Oral Daily   nicotine  14 mg Transdermal Daily   nutrition supplement (JUVEN)  1 packet Oral BID BM   pantoprazole  40 mg Oral Daily   sacubitril-valsartan  1 tablet Oral BID   sodium chloride flush  3 mL Intravenous Q12H   thiamine  100 mg Oral Daily   umeclidinium bromide  1 puff Inhalation Daily   Continuous Infusions:  cefTRIAXone (ROCEPHIN)  IV 2 g (09/17/21 2105)   And   metronidazole 500 mg (09/18/21 0831)   lactated ringers 75 mL/hr at 09/18/21 1123   PRN Meds: acetaminophen **OR** acetaminophen, albuterol, bisacodyl, hydrALAZINE, HYDROcodone-acetaminophen, LORazepam **OR** LORazepam, morphine injection, ondansetron **OR** ondansetron (ZOFRAN) IV, polyethylene glycol  Allergies:   No Active  Allergies  Social History:   Social History   Socioeconomic History   Marital status: Widowed    Spouse name: Not on file   Number of children: 0   Years of education: 12th   Highest education level: Not on file  Occupational History   Occupation: retired  Tobacco Use   Smoking status: Every Day    Packs/day: 1.50    Years: 65.00    Pack years: 97.50    Types: Cigarettes   Smokeless tobacco: Never  Substance and Sexual Activity   Alcohol use: Yes    Comment: 1 1/2 fifths a week, 4 shots/day   Drug use: No   Sexual activity: Not Currently  Other Topics Concern   Not on file  Social History Narrative   Not on file   Social Determinants of Health   Financial Resource Strain: Not on file  Food Insecurity: Not on file  Transportation Needs: Not on file  Physical Activity: Not on file  Stress: Not on file  Social Connections: Not on file  Intimate Partner Violence: Not on file    Family History:    Family History  Problem Relation Age of Onset   Hypertension Mother 29   Hypertension Father 32   Hypertension Sister    Hypertension Brother    Hypertension Sister    Hypertension Brother    Hypertension Brother    Hypertension Brother    Hypertension Brother      ROS:  Please see the history of present illness.  All other ROS reviewed and negative.     Physical Exam/Data:   Vitals:   09/17/21 2314 09/18/21 0455 09/18/21 0748 09/18/21 1200  BP: (!) 113/50 (!) 113/53 124/62 (!) 93/59  Pulse: (!) 53 (!) 51 (!) 52 60  Resp: 20 16 17 18   Temp: 97.7 F (36.5 C) 97.6 F (36.4 C) (!) 97.3 F (36.3 C) (!) 97.4 F (36.3 C)  TempSrc: Oral Oral Oral Oral  SpO2: 99% 94% 98% 100%  Weight:      Height:        Intake/Output Summary (Last 24 hours) at 09/18/2021 1318 Last data filed at 09/18/2021 0800 Gross per 24 hour  Intake 814.23 ml  Output 525 ml  Net 289.23 ml   Last 3 Weights 09/17/2021 11/21/2018 02/26/2017  Weight (lbs) 176 lb 5.9 oz 195 lb 204 lb 3.2  oz  Weight (kg) 80 kg 88.451 kg 92.625 kg     Body mass index is 23.92 kg/m.  General:  Well nourished, frail, elderly male, in no acute distress HEENT: normal Neck: no JVD Vascular: No carotid bruits; Distal pulses 1-2+ bilaterally Cardiac:  normal S1, S2; Irreg R&R; no murmur  Lungs:  Scattered rales to auscultation bilaterally, no wheezing, rhonchi  Abd: soft, nontender, no hepatomegaly  Ext: no edema Musculoskeletal:  No deformities, BUE and BLE strength weak but equal Skin: warm and dry; RLE wound bandaged, not disturbed  Neuro:  CNs 2-12 intact, no focal abnormalities noted Psych:  Normal affect   EKG:  The EKG was personally reviewed and demonstrates:  atrial fib, slow VR, HR 43. RBBB, LAFB, QRS duration 156 ms 2019 ECG is atrial fib, slow VR, HR 50, QRS duration 118 ms Telemetry:  Telemetry was personally reviewed and demonstrates:  atrial fib, generally bradycardic  Relevant CV Studies:  ECHO: 05/06/2015 - Left ventricle: The cavity size was normal. There was mild    concentric hypertrophy. Systolic function was normal. The    estimated ejection fraction was in the range of 50% to 55%. Wall    motion was normal; there were no regional wall motion    abnormalities. The study was not technically sufficient to allow    evaluation of LV diastolic dysfunction due to atrial fibrillation.  - Aortic valve: Trileaflet; mildly thickened, mildly calcified    leaflets. There was no regurgitation.  - Aortic root: The aortic root was normal in size.  - Mitral valve: There was no regurgitation.  - Left atrium: The atrium was moderately dilated.  - Right ventricle: Systolic function was normal.  - Right atrium: The atrium was mildly dilated.  - Tricuspid valve: There was mild regurgitation.  - Pulmonic valve: There was no regurgitation.  - Pulmonary arteries: Systolic pressure was mildly increased. PA    peak pressure: 43 mm Hg (S).  - Inferior vena cava: The vessel was normal in  size.  - Pericardium, extracardiac: There was no pericardial effusion.   Laboratory Data:  High Sensitivity Troponin:   Recent Labs  Lab 09/17/21 1149 09/17/21 1345  TROPONINIHS 10 5     Chemistry Recent Labs  Lab 09/17/21 1149 09/18/21 0222  NA 136 133*  K 5.5* 4.4  CL 107 108  CO2 19* 14*  GLUCOSE 101* 83  BUN 56* 39*  CREATININE 1.99* 1.18  CALCIUM 8.3* 7.3*  GFRNONAA 33* >60  ANIONGAP 10 11    Recent Labs  Lab 09/17/21 1149  PROT 5.5*  ALBUMIN 2.8*  AST 13*  ALT 8  ALKPHOS 67  BILITOT 1.0   Lipids No results for input(s): CHOL, TRIG, HDL, LABVLDL, LDLCALC, CHOLHDL in the last 168 hours.  Hematology Recent Labs  Lab 09/17/21 1149 09/18/21 0222 09/18/21 0432  WBC 7.4 QUESTIONABLE RESULTS, RECOMMEND RECOLLECT TO VERIFY 7.8  RBC 3.00* QUESTIONABLE RESULTS, RECOMMEND RECOLLECT TO VERIFY 2.78*  HGB 9.4* QUESTIONABLE RESULTS, RECOMMEND RECOLLECT TO VERIFY 8.7*  HCT 29.3* QUESTIONABLE RESULTS, RECOMMEND RECOLLECT TO VERIFY 26.1*  MCV 97.7 QUESTIONABLE RESULTS, RECOMMEND RECOLLECT TO VERIFY 93.9  MCH 31.3 QUESTIONABLE RESULTS, RECOMMEND RECOLLECT TO VERIFY 31.3  MCHC 32.1 QUESTIONABLE RESULTS, RECOMMEND RECOLLECT TO VERIFY 33.3  RDW 15.3 QUESTIONABLE RESULTS, RECOMMEND RECOLLECT TO VERIFY 15.3  PLT 121* QUESTIONABLE  RESULTS, RECOMMEND RECOLLECT TO VERIFY 112*   Thyroid  Recent Labs  Lab 09/17/21 1848  TSH 2.224    BNP Recent Labs  Lab 09/17/21 1149  BNP 353.6*    DDimer No results for input(s): DDIMER in the last 168 hours.   Radiology/Studies:  DG Chest Port 1 View  Result Date: 09/17/2021 CLINICAL DATA:  Shortness of breath, weakness, hypotension EXAM: PORTABLE CHEST 1 VIEW COMPARISON:  07/22/2021 FINDINGS: Overlying cardiac leads. Mild cardiomegaly, unchanged. Aortic atherosclerosis. Small right and possible trace left pleural effusion. Minimal streaky bibasilar opacities. No pneumothorax. IMPRESSION: Small right and possible trace left pleural  effusion with probable mild bibasilar atelectasis. Electronically Signed   By: Davina Poke D.O.   On: 09/17/2021 12:48     Assessment and Plan:   Atrial fib, slow VR - pt has hx slow VR, w/ HR 40s at times as far back as 2017 - pta on Coreg 12.5 mg bid >> held on admit - no discernable sx from the bradycardia, but consider PT consult to make sure he does not have chronotropic incompetence - w/ RBBB and LAFB new from 2019, likely has conduction dz - at this time, no clear indication for a PPM.   Risk Assessment/Risk Scores:        CHA2DS2-VASc Score = 6   This indicates a 9.7% annual risk of stroke. The patient's score is based upon: CHF History: 0 HTN History: 1 Diabetes History: 0 Stroke History: 2 Vascular Disease History: 1 Age Score: 2 Gender Score: 0    For questions or updates, please contact [facility] Please consult [email or web address] for contact info under    Signed, Rosaria Ferries, PA-C  09/18/2021 1:18 PM   History and all data above reviewed.  Patient examined.  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  As such she was moved to various locations including [place].  He came here several years ago to retire as he has a brother and sister here but he lives independently.  He has no wife or children.  He gets around his house with a walker.  It sounds like he has a above he falls quite a bit.  He has never had any syncope however.  He has a history of coronary disease but denies chest pressure, neck or arm discomfort.  He does not have shortness of breath, PND or orthopnea.  I agree with the findings as above.  The patient exam reveals [id number]   ,  Lungs: Clear  ,  Abd: Positive bowel sounds, no rebound no guarding, Ext No edema  .  All available labs, radiology testing, previous records reviewed. Agree with documented assessment and plan.   Atrial fib: He has a slow ventricular rate and I agree with  discontinuing the beta-blocker.  At this point I do not see an absolute indication for pacemaker as there is no clear indication that he is passed out.  He seems to be tolerating anticoagulation but he is high risk for trauma and bleeding related to this.  At this point I think his stroke risk is high enough to justify continuing the anticoagulant.  However, he needs a plan for his mobility and assessment of whether he is safe living independently.  If he continues to have multiple falls we may have to make the decision to stop the anticoagulation.   CAD: The patient is having no active anginal symptoms.  No change in therapy or further  ischemia evaluation is indicated.  Matthew Mahoney  2:51 PM  09/18/2021

## 2021-09-19 DIAGNOSIS — R001 Bradycardia, unspecified: Secondary | ICD-10-CM | POA: Diagnosis not present

## 2021-09-19 LAB — BASIC METABOLIC PANEL
Anion gap: 9 (ref 5–15)
BUN: 41 mg/dL — ABNORMAL HIGH (ref 8–23)
CO2: 19 mmol/L — ABNORMAL LOW (ref 22–32)
Calcium: 8 mg/dL — ABNORMAL LOW (ref 8.9–10.3)
Chloride: 108 mmol/L (ref 98–111)
Creatinine, Ser: 1.6 mg/dL — ABNORMAL HIGH (ref 0.61–1.24)
GFR, Estimated: 42 mL/min — ABNORMAL LOW (ref >60)
Glucose, Bld: 107 mg/dL — ABNORMAL HIGH (ref 70–99)
Potassium: 4 mmol/L (ref 3.5–5.1)
Sodium: 136 mmol/L (ref 135–145)

## 2021-09-19 LAB — CBC
HCT: 27.6 % — ABNORMAL LOW (ref 39.0–52.0)
Hemoglobin: 9.2 g/dL — ABNORMAL LOW (ref 13.0–17.0)
MCH: 31.6 pg (ref 26.0–34.0)
MCHC: 33.3 g/dL (ref 30.0–36.0)
MCV: 94.8 fL (ref 80.0–100.0)
Platelets: 133 10*3/uL — ABNORMAL LOW (ref 150–400)
RBC: 2.91 MIL/uL — ABNORMAL LOW (ref 4.22–5.81)
RDW: 15.3 % (ref 11.5–15.5)
WBC: 7.6 10*3/uL (ref 4.0–10.5)
nRBC: 0 % (ref 0.0–0.2)

## 2021-09-19 MED ORDER — SODIUM CHLORIDE 0.9 % IV SOLN
2.0000 g | INTRAVENOUS | Status: DC
  Administered 2021-09-19: 2 g via INTRAVENOUS
  Filled 2021-09-19 (×2): qty 20

## 2021-09-19 MED ORDER — METRONIDAZOLE 500 MG/100ML IV SOLN: 500.0000 mg | Freq: Two times a day (BID) | INTRAVENOUS | Status: DC

## 2021-09-19 MED ORDER — METRONIDAZOLE 500 MG PO TABS
500.0000 mg | ORAL_TABLET | Freq: Two times a day (BID) | ORAL | Status: DC
  Administered 2021-09-19 – 2021-09-20 (×3): 500 mg via ORAL
  Filled 2021-09-19 (×3): qty 1

## 2021-09-19 NOTE — Evaluation (Signed)
Occupational Therapy Evaluation Patient Details Name: Matthew Mahoney MRN: [id number] DOB: [birth year] Today's Date: 09/19/2021   History of Present Illness Pt is an 85 y/o male admitted 10/12 secondary to symptomatic bradycardia, weakness, and hypotension. Pt also with RLE cellulitis. PMH includes prostate cancer, CVA, HTN, COPD, alcohol abuse, tobacco use, CHF, and afib.   Clinical Impression   Pt admitted for concerns listed above. PTA pt reported that he was independent with all ADL's and IADL's, including light housekeeping and meal preparation, as he lives alone. At this time, pt is limited by balance concerns and increased weakness. Pt requires mod A +1-2 for all ADL's and functional mobility using a RW. Recommending SNF level therapies as he has had a decline in function since admission. OT will follow acutely.      Recommendations for follow up therapy are one component of a multi-disciplinary discharge planning process, led by the attending physician.  Recommendations may be updated based on patient status, additional functional criteria and insurance authorization.   Follow Up Recommendations  SNF    Equipment Recommendations  None recommended by OT    Recommendations for Other Services       Precautions / Restrictions Precautions Precautions: Fall Restrictions Weight Bearing Restrictions: No      Mobility Bed Mobility Overal bed mobility: Needs Assistance Bed Mobility: Supine to Sit     Supine to sit: Min assist;+2 for physical assistance;+2 for safety/equipment     General bed mobility comments: Required assist for trunk elevation    Transfers Overall transfer level: Needs assistance Equipment used: Rolling walker (2 wheeled) Transfers: Sit to/from Stand Sit to Stand: Min assist;+2 safety/equipment;+2 physical assistance;Mod assist         General transfer comment: Min A for steadying to stand from EOB. However, from lower toilet surface required mod A  +2 to stand. Pt losing balance when returning to sitting in chair and required mod A +2 for controlled descent.    Balance Overall balance assessment: Needs assistance Sitting-balance support: No upper extremity supported;Feet supported Sitting balance-Leahy Scale: Fair     Standing balance support: Bilateral upper extremity supported;During functional activity Standing balance-Leahy Scale: Poor Standing balance comment: Reliant on BUE support                           ADL either performed or assessed with clinical judgement   ADL Overall ADL's : Needs assistance/impaired Eating/Feeding: Independent;Sitting   Grooming: Min guard;Standing   Upper Body Bathing: Supervision/ safety;Sitting   Lower Body Bathing: Minimal assistance;Moderate assistance;Sitting/lateral leans;Sit to/from stand   Upper Body Dressing : Supervision/safety;Sitting   Lower Body Dressing: Minimal assistance;Moderate assistance;Sitting/lateral leans;Sit to/from stand   Toilet Transfer: Moderate assistance;Ambulation Toilet Transfer Details (indicate cue type and reason): Mod A +2 to stand from toilet Toileting- Clothing Manipulation and Hygiene: Minimal assistance;Sitting/lateral lean;Sit to/from stand       Functional mobility during ADLs: Min guard;Rolling walker General ADL Comments: Pt balance is impacted requring min A for LB ADL's and Mod A for transfers from lower surface     Vision Baseline Vision/History: 1 Wears glasses Ability to See in Adequate Light: 0 Adequate Patient Visual Report: No change from baseline Vision Assessment?: No apparent visual deficits     Perception Perception Perception Tested?: No   Praxis Praxis Praxis tested?: Not tested    Pertinent Vitals/Pain Pain Assessment: Faces Faces Pain Scale: Hurts little more Pain Location: RLE Pain Descriptors / Indicators: Guarding;Grimacing Pain  Intervention(s): Limited activity within patient's tolerance;Monitored  during session;Repositioned     Hand Dominance     Extremity/Trunk Assessment Upper Extremity Assessment Upper Extremity Assessment: Overall WFL for tasks assessed   Lower Extremity Assessment Lower Extremity Assessment: Defer to PT evaluation   Cervical / Trunk Assessment Cervical / Trunk Assessment: Kyphotic   Communication Communication Communication: HOH   Cognition Arousal/Alertness: Awake/alert Behavior During Therapy: WFL for tasks assessed/performed Overall Cognitive Status: Impaired/Different from baseline Area of Impairment: Memory                     Memory: Decreased short-term memory         General Comments: Questionable memory deficits noted throughout.   General Comments  Pt's brother and sister in law present    Exercises     Shoulder Instructions      Home Living Family/patient expects to be discharged to:: Private residence Living Arrangements: Alone Available Help at Discharge: Family;Available PRN/intermittently Type of Home: House Home Access: Stairs to enter [employer] of Steps: 2 Entrance Stairs-Rails: None Home Layout: One level     Bathroom Shower/Tub: Teacher, early years/pre: Standard Bathroom Accessibility: Yes How Accessible: Accessible via walker Home Equipment: [place] - 2 wheels;Cane - single point          Prior Functioning/Environment Level of Independence: Needs assistance  Gait / Transfers Assistance Needed: Uses RW for inside of home. Uses a cane to manage steps. ADL's / Homemaking Assistance Needed: Reports sister assists with medicine management. Still drives.            OT Problem List: Decreased strength;Decreased activity tolerance;Impaired balance (sitting and/or standing);Decreased coordination;Decreased safety awareness;Decreased knowledge of use of DME or AE;Increased edema;Pain      OT Treatment/Interventions: Self-care/ADL training;Therapeutic exercise;Energy  conservation;DME and/or AE instruction;Therapeutic activities;Patient/family education;Balance training    OT Goals(Current goals can be found in the care plan section) Acute Rehab OT Goals Patient Stated Goal: to get stronger OT Goal Formulation: With patient Time For Goal Achievement: 10/03/21 Potential to Achieve Goals: Good ADL Goals Pt Will Perform Grooming: with modified independence;standing Pt Will Perform Lower Body Bathing: with modified independence;sitting/lateral leans;sit to/from stand Pt Will Perform Lower Body Dressing: with modified independence;sitting/lateral leans;sit to/from stand Pt Will Transfer to Toilet: with modified independence;ambulating Pt Will Perform Toileting - Clothing Manipulation and hygiene: with modified independence;sitting/lateral leans;sit to/from stand Additional ADL Goal #1: Pt will maintain dynamic standing balance for 5 mins to prepare for IADL tasks at home.  OT Frequency: Min 2X/week   Barriers to D/C:            Co-evaluation PT/OT/SLP Co-Evaluation/Treatment: Yes Reason for Co-Treatment: For patient/therapist safety;To address functional/ADL transfers   OT goals addressed during session: ADL's and self-care;Strengthening/ROM      AM-PAC OT "6 Clicks" Daily Activity     Outcome Measure Help from another person eating meals?: None Help from another person taking care of personal grooming?: A Little Help from another person toileting, which includes using toliet, bedpan, or urinal?: A Lot Help from another person bathing (including washing, rinsing, drying)?: A Lot Help from another person to put on and taking off regular upper body clothing?: A Little Help from another person to put on and taking off regular lower body clothing?: A Lot 6 Click Score: 16   End of Session Equipment Utilized During Treatment: Gait belt;Rolling walker Nurse Communication: Mobility status  Activity Tolerance: Patient tolerated treatment well Patient  left: in chair;with  call bell/phone within reach;with chair alarm set  OT Visit Diagnosis: Unsteadiness on feet (R26.81);Other abnormalities of gait and mobility (R26.89);Muscle weakness (generalized) (M62.81)                Time: 1572-6203 OT Time Calculation (min): 22 min Charges:  OT General Charges $OT Visit: 1 Visit OT Evaluation $OT Eval Moderate Complexity: 1 Mod  Lanelle Lindo H., OTR/L Acute Rehabilitation  Zafira Munos Elane Yolanda Bonine 09/19/2021, 7:47 PM

## 2021-09-19 NOTE — Progress Notes (Addendum)
PROGRESS NOTE  Matthew Mahoney WPY:[id number] DOB: [birth year] DOA: 09/17/2021 PCP: [name], MD  HPI/Recap of past 24 hours:  Matthew Mahoney is a 85 y.o. male with medical history significant of ETOH abuse; afib on Eliquis; chronic combined CHF; COPD; CVA; HTN; HLD; prostate CA; and PVD presenting with weakness.  He reports that his R leg - he fell about 2 weeks ago - is all bruised up and not healing well and doesn't look good and it hurts all the time and he is having trouble walking on it.  His legs have been "weak and rubbery" for a while and he does not get along well.  Mild chest pain.  No SOB and he does not wear O2 at home.  He does feel like he is weak and dragging. [facility] called to admit.  Seen by cardiology no further plans regarding his bradycardia.  No plan for PPM.  Ongoing IV antibiotics for right lower extremity cellulitis.  09/19/21: Patient was seen and examined at his bedside.  Endorses that his right lower extremity looks improved.  Assessment/Plan: Principal Problem:   Symptomatic bradycardia Active Problems:   Peripheral vascular disease (HCC)   Hypertension   COPD (chronic obstructive pulmonary disease) (HCC)   Hyperlipidemia   Alcohol abuse   Tobacco abuse   Atrial fibrillation (HCC)   Chronic combined systolic and diastolic congestive heart failure (HCC)   Ambulatory dysfunction   Abrasion of lower leg with infection   Pressure injury of skin   Protein-calorie malnutrition, severe  Right lower extremity cellulitis, POA. Currently on Rocephin and p.o. Flagyl.  Plan to de-escalate to p.o. antibiotics in the next 24 hours.  Erythema has improved.  Minimally tender, minimal discharge. Monitor fever curve and WBC.  AKI, suspect prerenal in the setting of dehydration Baseline creatinine appears to be 1.1 with GFR greater than 60 This morning creatinine is rising 1.60 GFR 42 Monitor urine output Repeat BMP in the morning  Non anion gap metabolic  acidosis Anion gap 9 serum bicarb 19 Suspect in the setting of acute renal insufficiency. Is currently on IV fluid hydration, continue judiciously Monitor urine output Repeat renal panel in the morning  Atrial fibrillation with slow ventricular response On Eliquis for CVA prevention He is on 2.5 mg Norvasc, continue  Acute blood loss anemia Hemoglobin dropped 9.4 on admission from 12.4 in 2016 Hemoglobin this morning 9.2 with MCV of 94 Continue to monitor H&H  Resolved symptomatic bradycardia Seen by cardiology, no plan for PPM.  Ambulatory dysfunction with multiple falls Continue to mobilize as tolerated Continue PT OT with assistance and fall precautions  Alcohol use disorder No evidence of alcohol withdrawal at the time of the visit Continue CIWA protocol.  Ambulatory dysfunction PT OT to assess Fall precautions.    Code Status: Full code.  Family Communication: None at bedside  Disposition Plan: Likely will discharge to home with home health services.   Consultants: Cardiology  Procedures: None  Antimicrobials: Rocephin P.o. Flagyl.  DVT prophylaxis: Eliquis  Status is: Inpatient           Objective: Vitals:   09/19/21 0000 09/19/21 0407 09/19/21 0742 09/19/21 1000  BP:  (!) 159/82 137/84   Pulse: 60 66 61 61  Resp:  20 (!) 21   Temp:  98.2 F (36.8 C) 98.3 F (36.8 C)   TempSrc:  Oral Oral   SpO2:  98% 99%   Weight:      Height:  Intake/Output Summary (Last 24 hours) at 09/19/2021 1119 Last data filed at 09/19/2021 0646 Gross per 24 hour  Intake 968 ml  Output 650 ml  Net 318 ml   Filed Weights   09/17/21 1141  Weight: 80 kg    Exam:  General: 85 y.o. year-old male well developed well nourished in no acute distress.  Alert and oriented x3. Cardiovascular: Irregular rate and rhythm with no rubs or gallops.  No thyromegaly or JVD noted.   Respiratory: Clear to auscultation with no wheezes or rales. Good  inspiratory effort. Abdomen: Soft nontender nondistended with normal bowel sounds x4 quadrants. Musculoskeletal: Trace lower extremity edema.  Skin: Right lower extremity erythematous, warm to the touch, mildly tender on palpation. Psychiatry: Mood is appropriate for condition and setting   Data Reviewed: CBC: Recent Labs  Lab 09/17/21 1149 09/18/21 0222 09/18/21 0432 09/19/21 0536  WBC 7.4 QUESTIONABLE RESULTS, RECOMMEND RECOLLECT TO VERIFY 7.8 7.6  NEUTROABS 5.0  --   --   --   HGB 9.4* QUESTIONABLE RESULTS, RECOMMEND RECOLLECT TO VERIFY 8.7* 9.2*  HCT 29.3* QUESTIONABLE RESULTS, RECOMMEND RECOLLECT TO VERIFY 26.1* 27.6*  MCV 97.7 QUESTIONABLE RESULTS, RECOMMEND RECOLLECT TO VERIFY 93.9 94.8  PLT 121* QUESTIONABLE RESULTS, RECOMMEND RECOLLECT TO VERIFY 112* 811*   Basic Metabolic Panel: Recent Labs  Lab 09/17/21 1149 09/18/21 0222 09/19/21 0536  NA 136 133* 136  K 5.5* 4.4 4.0  CL 107 108 108  CO2 19* 14* 19*  GLUCOSE 101* 83 107*  BUN 56* 39* 41*  CREATININE 1.99* 1.18 1.60*  CALCIUM 8.3* 7.3* 8.0*   GFR: Estimated Creatinine Clearance: 38.4 mL/min (A) (by C-G formula based on SCr of 1.6 mg/dL (H)). Liver Function Tests: Recent Labs  Lab 09/17/21 1149  AST 13*  ALT 8  ALKPHOS 67  BILITOT 1.0  PROT 5.5*  ALBUMIN 2.8*   No results for input(s): LIPASE, AMYLASE in the last 168 hours. No results for input(s): AMMONIA in the last 168 hours. Coagulation Profile: No results for input(s): INR, PROTIME in the last 168 hours. Cardiac Enzymes: No results for input(s): CKTOTAL, CKMB, CKMBINDEX, TROPONINI in the last 168 hours. BNP (last 3 results) No results for input(s): PROBNP in the last 8760 hours. HbA1C: No results for input(s): HGBA1C in the last 72 hours. CBG: No results for input(s): GLUCAP in the last 168 hours. Lipid Profile: No results for input(s): CHOL, HDL, LDLCALC, TRIG, CHOLHDL, LDLDIRECT in the last 72 hours. Thyroid Function Tests: Recent Labs     09/17/21 1848  TSH 2.224   Anemia Panel: No results for input(s): VITAMINB12, FOLATE, FERRITIN, TIBC, IRON, RETICCTPCT in the last 72 hours. Urine analysis:    Component Value Date/Time   COLORURINE YELLOW 05/06/2015 0038   APPEARANCEUR CLEAR 05/06/2015 0038   LABSPEC 1.012 05/06/2015 0038   PHURINE 5.5 05/06/2015 0038   GLUCOSEU NEGATIVE 05/06/2015 0038   HGBUR NEGATIVE 05/06/2015 0038   BILIRUBINUR NEGATIVE 05/06/2015 0038   KETONESUR NEGATIVE 05/06/2015 0038   PROTEINUR 100 (A) 05/06/2015 0038   UROBILINOGEN 0.2 05/06/2015 0038   NITRITE NEGATIVE 05/06/2015 0038   LEUKOCYTESUR NEGATIVE 05/06/2015 0038   Sepsis Labs: @LABRCNTIP (procalcitonin:4,lacticidven:4)  ) Recent Results (from the past 240 hour(s))  Resp Panel by RT-PCR (Flu A&B, Covid) Nasopharyngeal Swab     Status: None   Collection Time: 09/17/21 11:50 AM   Specimen: Nasopharyngeal Swab; Nasopharyngeal(NP) swabs in vial transport medium  Result Value Ref Range Status   SARS Coronavirus 2 by RT PCR NEGATIVE NEGATIVE  Final    Comment: (NOTE) SARS-CoV-2 target nucleic acids are NOT DETECTED.  The SARS-CoV-2 RNA is generally detectable in upper respiratory specimens during the acute phase of infection. The lowest concentration of SARS-CoV-2 viral copies this assay can detect is 138 copies/mL. A negative result does not preclude SARS-Cov-2 infection and should not be used as the sole basis for treatment or other patient management decisions. A negative result may occur with  improper specimen collection/handling, submission of specimen other than nasopharyngeal swab, presence of viral mutation(s) within the areas targeted by this assay, and inadequate number of viral copies(<138 copies/mL). A negative result must be combined with clinical observations, patient history, and epidemiological information. The expected result is Negative.  Fact Sheet for Patients:   EntrepreneurPulse.com.au  Fact Sheet for Healthcare Providers:  IncredibleEmployment.be  This test is no t yet approved or cleared by the [place] FDA and  has been authorized for detection and/or diagnosis of SARS-CoV-2 by FDA under an Emergency Use Authorization (EUA). This EUA will remain  in effect (meaning this test can be used) for the duration of the COVID-19 declaration under Section 564(b)(1) of the Act, 21 U.S.C.section 360bbb-3(b)(1), unless the authorization is terminated  or revoked sooner.       Influenza A by PCR NEGATIVE NEGATIVE Final   Influenza B by PCR NEGATIVE NEGATIVE Final    Comment: (NOTE) The Xpert Xpress SARS-CoV-2/FLU/RSV plus assay is intended as an aid in the diagnosis of influenza from Nasopharyngeal swab specimens and should not be used as a sole basis for treatment. Nasal washings and aspirates are unacceptable for Xpert Xpress SARS-CoV-2/FLU/RSV testing.  Fact Sheet for Patients: EntrepreneurPulse.com.au  Fact Sheet for Healthcare Providers: IncredibleEmployment.be  This test is not yet approved or cleared by the [place] FDA and has been authorized for detection and/or diagnosis of SARS-CoV-2 by FDA under an Emergency Use Authorization (EUA). This EUA will remain in effect (meaning this test can be used) for the duration of the COVID-19 declaration under Section 564(b)(1) of the Act, 21 U.S.C. section 360bbb-3(b)(1), unless the authorization is terminated or revoked.  Performed at [facility], [place] [address], [place], [place] [address]       Studies: VAS [place] ABI WITH/WO TBI  Result Date: 09/19/2021  LOWER EXTREMITY DOPPLER STUDY Patient Name:  Matthew Mahoney  Date of Exam:   09/18/2021 Medical Rec #: [id number]        Accession #:    [id number] Date of Birth: January 15, 1936        Patient Gender: M Patient Age:   17 years Exam Location:  [facility] Procedure:      VAS [place] ABI WITH/WO TBI Referring Phys: Anderson Malta YATES --------------------------------------------------------------------------------  Indications: Ulceration, and cellulitis. High Risk         Hypertension, hyperlipidemia, coronary artery disease, prior Factors:          CVA. Other Factors: ETOH abuse. Atrial fibrillation, on Eliquis, CHF, COPD, Prostate                cancer, Aortic stenosis, mitral regurgitation,.  Comparison Study: Prior ABI done 02/11/17 Performing Technologist: Sharion Dove RVS  Examination Guidelines: A complete evaluation includes at minimum, Doppler waveform signals and systolic blood pressure reading at the level of bilateral brachial, anterior tibial, and posterior tibial arteries, when vessel segments are accessible. Bilateral testing is considered an integral part of a complete examination. Photoelectric Plethysmograph (PPG) waveforms and toe systolic pressure readings are included as  required and additional duplex testing as needed. Limited examinations for reoccurring indications may be performed as noted.  ABI Findings: +---------+------------------+-----+----------+--------+ Right    Rt Pressure (mmHg)IndexWaveform  Comment  +---------+------------------+-----+----------+--------+ Brachial 127                    triphasic          +---------+------------------+-----+----------+--------+ PTA      130               1.02 monophasic         +---------+------------------+-----+----------+--------+ DP       125               0.98 monophasic         +---------+------------------+-----+----------+--------+ Great Toe34                0.27                    +---------+------------------+-----+----------+--------+ +---------+------------------+-----+----------+-------+ Left     Lt Pressure (mmHg)IndexWaveform  Comment +---------+------------------+-----+----------+-------+ Brachial 124                    triphasic          +---------+------------------+-----+----------+-------+ PTA      137               1.08 monophasic        +---------+------------------+-----+----------+-------+ DP       127               1.00 monophasic        +---------+------------------+-----+----------+-------+ Great Toe51                0.40                   +---------+------------------+-----+----------+-------+ +-------+-----------+-----------+------------+------------+ ABI/TBIToday's ABIToday's TBIPrevious ABIPrevious TBI +-------+-----------+-----------+------------+------------+ Right  1.02       0.27       0.81        0.66         +-------+-----------+-----------+------------+------------+ Left   1.08       0.40       1.05        0.76         +-------+-----------+-----------+------------+------------+ Arterial wall calcification precludes accurate ankle pressures and ABIs. Right ABIs appear increased compared to prior study on 02/11/17. Left ABIs appear dessentially unchanged compared to prior study on 02/11/17. Bilateral TBIs appear decreased compared to prior study on 02/11/17.  Summary: Right: Resting right ankle-brachial index is within normal range. No evidence of significant right lower extremity arterial disease. The right toe-brachial index is abnormal. Although ankle brachial indices are within normal limits (0.95-1.29), arterial Doppler waveforms at the ankle suggest some component of arterial occlusive disease. Left: Resting left ankle-brachial index is within normal range. No evidence of significant left lower extremity arterial disease. The left toe-brachial index is abnormal. Although ankle brachial indices are within normal limits (0.95-1.29), arterial Doppler waveforms at the ankle suggest some component of arterial occlusive disease.  *See table(s) above for measurements and observations.  Electronically signed by Orlie Pollen on 09/19/2021 at 8:21:26 AM.    Final     Scheduled Meds:  amLODipine  2.5  mg Oral Daily   apixaban  2.5 mg Oral BID   atorvastatin  40 mg Oral QPM   docusate sodium  100 mg Oral BID   feeding supplement  237 mL Oral BID BM   fluticasone furoate-vilanterol  1 puff  Inhalation Daily   folic acid  1 mg Oral Daily   latanoprost  1 drop Both Eyes QHS   metroNIDAZOLE  500 mg Oral Q12H   multivitamin with minerals  1 tablet Oral Daily   nicotine  14 mg Transdermal Daily   nutrition supplement (JUVEN)  1 packet Oral BID BM   pantoprazole  40 mg Oral Daily   sacubitril-valsartan  1 tablet Oral BID   sodium chloride flush  3 mL Intravenous Q12H   thiamine  100 mg Oral Daily   umeclidinium bromide  1 puff Inhalation Daily    Continuous Infusions:  cefTRIAXone (ROCEPHIN)  IV     lactated ringers 75 mL/hr at 09/19/21 0646     LOS: 2 days     Kayleen Memos, MD [facility] Pager [phone]  If 7PM-7AM, please contact night-coverage [email or web address] Password [email or web address] 09/19/2021, 11:19 AM

## 2021-09-19 NOTE — Progress Notes (Addendum)
Progress Note  Patient Name: Matthew Mahoney Date of Encounter: 09/19/2021  Primary Cardiologist: Mertie Moores, MD  Subjective   No CP or SOB. Strength slowly coming back. No other acute complaints.  Inpatient Medications    Scheduled Meds:  amLODipine  2.5 mg Oral Daily   apixaban  2.5 mg Oral BID   atorvastatin  40 mg Oral QPM   docusate sodium  100 mg Oral BID   feeding supplement  237 mL Oral BID BM   fluticasone furoate-vilanterol  1 puff Inhalation Daily   folic acid  1 mg Oral Daily   latanoprost  1 drop Both Eyes QHS   multivitamin with minerals  1 tablet Oral Daily   nicotine  14 mg Transdermal Daily   nutrition supplement (JUVEN)  1 packet Oral BID BM   pantoprazole  40 mg Oral Daily   sacubitril-valsartan  1 tablet Oral BID   sodium chloride flush  3 mL Intravenous Q12H   thiamine  100 mg Oral Daily   umeclidinium bromide  1 puff Inhalation Daily   Continuous Infusions:  cefTRIAXone (ROCEPHIN)  IV 2 g (09/18/21 1847)   And   metronidazole 500 mg (09/18/21 1850)   lactated ringers 75 mL/hr at 09/19/21 0646   PRN Meds: acetaminophen **OR** acetaminophen, albuterol, bisacodyl, hydrALAZINE, HYDROcodone-acetaminophen, LORazepam **OR** LORazepam, morphine injection, ondansetron **OR** ondansetron (ZOFRAN) IV, polyethylene glycol   Vital Signs    Vitals:   09/18/21 2346 09/19/21 0000 09/19/21 0407 09/19/21 0742  BP: 108/64  (!) 159/82 137/84  Pulse: 68 60 66 61  Resp: 17  20 (!) 21  Temp: 97.8 F (36.6 C)  98.2 F (36.8 C) 98.3 F (36.8 C)  TempSrc: Oral  Oral Oral  SpO2: 97%  98% 99%  Weight:      Height:        Intake/Output Summary (Last 24 hours) at 09/19/2021 0944 Last data filed at 09/19/2021 0646 Gross per 24 hour  Intake 968 ml  Output 650 ml  Net 318 ml   Last 3 Weights 09/17/2021 11/21/2018 02/26/2017  Weight (lbs) 176 lb 5.9 oz 195 lb 204 lb 3.2 oz  Weight (kg) 80 kg 88.451 kg 92.625 kg     Telemetry    Atrial fib - brief slow  rates during earlier AM hours 39-42bpm but largely 50s-60s this AM - Personally Reviewed  Physical Exam   GEN: No acute distress.  HEENT: Normocephalic, atraumatic, sclera non-icteric. Neck: No JVD or bruits. Cardiac: Irregularly irregular, no murmurs, rubs, or gallops.  Respiratory: Clear to auscultation bilaterally. Breathing is unlabored. GI: Soft, nontender, non-distended, BS +x 4. MS: no deformity. Extremities: No clubbing or cyanosis. No edema. Chronic venous stasis discoloration. Neuro:  AAOx3. Follows commands. Psych:  Responds to questions appropriately with a normal affect.  Labs    High Sensitivity Troponin:   Recent Labs  Lab 09/17/21 1149 09/17/21 1345  TROPONINIHS 10 5      Cardiac EnzymesNo results for input(s): TROPONINI in the last 168 hours. No results for input(s): TROPIPOC in the last 168 hours.   Chemistry Recent Labs  Lab 09/17/21 1149 09/18/21 0222 09/19/21 0536  NA 136 133* 136  K 5.5* 4.4 4.0  CL 107 108 108  CO2 19* 14* 19*  GLUCOSE 101* 83 107*  BUN 56* 39* 41*  CREATININE 1.99* 1.18 1.60*  CALCIUM 8.3* 7.3* 8.0*  PROT 5.5*  --   --   ALBUMIN 2.8*  --   --  AST 13*  --   --   ALT 8  --   --   ALKPHOS 67  --   --   BILITOT 1.0  --   --   GFRNONAA 33* >60 42*  ANIONGAP 10 11 9      Hematology Recent Labs  Lab 09/18/21 0222 09/18/21 0432 09/19/21 0536  WBC QUESTIONABLE RESULTS, RECOMMEND RECOLLECT TO VERIFY 7.8 7.6  RBC QUESTIONABLE RESULTS, RECOMMEND RECOLLECT TO VERIFY 2.78* 2.91*  HGB QUESTIONABLE RESULTS, RECOMMEND RECOLLECT TO VERIFY 8.7* 9.2*  HCT QUESTIONABLE RESULTS, RECOMMEND RECOLLECT TO VERIFY 26.1* 27.6*  MCV QUESTIONABLE RESULTS, RECOMMEND RECOLLECT TO VERIFY 93.9 94.8  MCH QUESTIONABLE RESULTS, RECOMMEND RECOLLECT TO VERIFY 31.3 31.6  MCHC QUESTIONABLE RESULTS, RECOMMEND RECOLLECT TO VERIFY 33.3 33.3  RDW QUESTIONABLE RESULTS, RECOMMEND RECOLLECT TO VERIFY 15.3 15.3  PLT QUESTIONABLE RESULTS, RECOMMEND RECOLLECT TO  VERIFY 112* 133*    BNP Recent Labs  Lab 09/17/21 1149  BNP 353.6*     DDimer No results for input(s): DDIMER in the last 168 hours.   Radiology    DG Chest Port 1 View  Result Date: 09/17/2021 CLINICAL DATA:  Shortness of breath, weakness, hypotension EXAM: PORTABLE CHEST 1 VIEW COMPARISON:  07/22/2021 FINDINGS: Overlying cardiac leads. Mild cardiomegaly, unchanged. Aortic atherosclerosis. Small right and possible trace left pleural effusion. Minimal streaky bibasilar opacities. No pneumothorax. IMPRESSION: Small right and possible trace left pleural effusion with probable mild bibasilar atelectasis. Electronically Signed   By: Davina Poke D.O.   On: 09/17/2021 12:48   VAS [place] ABI WITH/WO TBI  Result Date: 09/19/2021  LOWER EXTREMITY DOPPLER STUDY Patient Name:  Matthew Mahoney  Date of Exam:   09/18/2021 Medical Rec #: [id number]        Accession #:    [id number] Date of Birth: [birth year]        Patient Gender: M Patient Age:   38 years Exam Location:  [facility] Procedure:      VAS [place] ABI WITH/WO TBI Referring Phys: Anderson Malta YATES --------------------------------------------------------------------------------  Indications: Ulceration, and cellulitis. High Risk         Hypertension, hyperlipidemia, coronary artery disease, prior Factors:          CVA. Other Factors: ETOH abuse. Atrial fibrillation, on Eliquis, CHF, COPD, Prostate                cancer, Aortic stenosis, mitral regurgitation,.  Comparison Study: Prior ABI done 02/11/17 Performing Technologist: Sharion Dove RVS  Examination Guidelines: A complete evaluation includes at minimum, Doppler waveform signals and systolic blood pressure reading at the level of bilateral brachial, anterior tibial, and posterior tibial arteries, when vessel segments are accessible. Bilateral testing is considered an integral part of a complete examination. Photoelectric Plethysmograph (PPG) waveforms and toe systolic pressure readings are  included as required and additional duplex testing as needed. Limited examinations for reoccurring indications may be performed as noted.  ABI Findings: +---------+------------------+-----+----------+--------+ Right    Rt Pressure (mmHg)IndexWaveform  Comment  +---------+------------------+-----+----------+--------+ Brachial 127                    triphasic          +---------+------------------+-----+----------+--------+ PTA      130               1.02 monophasic         +---------+------------------+-----+----------+--------+ DP       125  0.98 monophasic         +---------+------------------+-----+----------+--------+ Great Toe34                0.27                    +---------+------------------+-----+----------+--------+ +---------+------------------+-----+----------+-------+ Left     Lt Pressure (mmHg)IndexWaveform  Comment +---------+------------------+-----+----------+-------+ Brachial 124                    triphasic         +---------+------------------+-----+----------+-------+ PTA      137               1.08 monophasic        +---------+------------------+-----+----------+-------+ DP       127               1.00 monophasic        +---------+------------------+-----+----------+-------+ Great Toe51                0.40                   +---------+------------------+-----+----------+-------+ +-------+-----------+-----------+------------+------------+ ABI/TBIToday's ABIToday's TBIPrevious ABIPrevious TBI +-------+-----------+-----------+------------+------------+ Right  1.02       0.27       0.81        0.66         +-------+-----------+-----------+------------+------------+ Left   1.08       0.40       1.05        0.76         +-------+-----------+-----------+------------+------------+ Arterial wall calcification precludes accurate ankle pressures and ABIs. Right ABIs appear increased compared to prior study on  02/11/17. Left ABIs appear dessentially unchanged compared to prior study on 02/11/17. Bilateral TBIs appear decreased compared to prior study on 02/11/17.  Summary: Right: Resting right ankle-brachial index is within normal range. No evidence of significant right lower extremity arterial disease. The right toe-brachial index is abnormal. Although ankle brachial indices are within normal limits (0.95-1.29), arterial Doppler waveforms at the ankle suggest some component of arterial occlusive disease. Left: Resting left ankle-brachial index is within normal range. No evidence of significant left lower extremity arterial disease. The left toe-brachial index is abnormal. Although ankle brachial indices are within normal limits (0.95-1.29), arterial Doppler waveforms at the ankle suggest some component of arterial occlusive disease.  *See table(s) above for measurements and observations.  Electronically signed by Orlie Pollen on 09/19/2021 at 8:21:26 AM.    Final     Cardiac Studies   2D Echo 2016 - Left ventricle: The cavity size was normal. There was mild    concentric hypertrophy. Systolic function was normal. The    estimated ejection fraction was in the range of 50% to 55%. Wall    motion was normal; there were no regional wall motion    abnormalities. The study was not technically sufficient to allow    evaluation of LV diastolic dysfunction due to atrial    fibrillation.  - Aortic valve: Trileaflet; mildly thickened, mildly calcified    leaflets. There was no regurgitation.  - Aortic root: The aortic root was normal in size.  - Mitral valve: There was no regurgitation.  - Left atrium: The atrium was moderately dilated.  - Right ventricle: Systolic function was normal.  - Right atrium: The atrium was mildly dilated.  - Tricuspid valve: There was mild regurgitation.  - Pulmonic valve: There was no regurgitation.  - Pulmonary arteries: Systolic pressure was mildly increased. PA  peak pressure: 43  mm Hg (S).  - Inferior vena cava: The vessel was normal in size.  - Pericardium, extracardiac: There was no pericardial effusion.   Patient Profile     85 y.o. male with CAD s/p remote stents (including at [facility] to LAD, date unavailable), chronic combined CHF (remote EF 20-25% in 2014, improved to 50-55% in 2016), permanent atrial fibrillation, CKD3b, CVA, prior GIB with shock, prostate CA, LE edema, alcohol abuse, conduction disorder, GERD, NSVT, COPD, diverticulitis, prior ascites who presented with generalized weakness. He fell 2 weeks prior and injured his right leg. He was found to have a lower extremity wound with cellulitis. On admission he reported some mild chest pain. Cardiology consulted for bradycardia.   Assessment & Plan    1. Generalized weakness - suspect multifactorial in setting of deconditioning, ETOH abuse, ambulatory dysfunction, anemia, and bradycardia  2. Atrial fibrillation with slow ventricular response - RBBB and LAFB indicates some degree of underlying conduction disease  - TSH wnl - carvedilol 12.5mg  BID discontinued - brief bradycardia in early AM hours but largely 50s-60s this AM - continue to follow with BB washout - per MD note, decision has been made to continue anticoagulation cautiously but with plan for increasing pt mobility given frequent falling  3. Acute anemia - last Hgb per outpatient [facility] was 13.3 in 05/2017, admitted in the 9 range - FOBT negative - recommend further management by primary team especially in setting of anticoagulation  4. CAD s/p prior PCI - some report of CP on admission but pt denies any recent anginal-type symptoms - troponins negative - no ASA given concomitant Eliquis, continue statin, BB discontinued as above  5. H/o chronic combined CHF - volume status looks ok - continue medical therapy Delene Loll) - hold off spiro given CKD at this time - consider updating echo this admission  6. PAD  - ABIs 09/18/21 -  right ABI Wnl (R TBI is abnormal), L ABI wnl (TBI abnormla) - f/u outpatient  7. CKD 3b - most recent baseline in 04/2021 by [facility] was 1.67 - Cr remains near most recent baseline this AM  Remainder of medical issues per IM.    For questions or updates, please contact [place] Please consult [email or web address] for contact info under Cardiology/STEMI.  Signed, Charlie Pitter, PA-C 09/19/2021, 9:44 AM    History and all data above reviewed.  Patient examined.  I agree with the findings as above.   He thinks that his breathing is not quite at baseline.  Denies any chest pain.  No presyncope or syncope.  The patient exam reveals QIH:[id number]  ,  Lungs: Decreased breath sounds  ,  Abd: Positive bowel sounds, no rebound no guarding, Ext No edema  .  All available labs, radiology testing, previous records reviewed. Agree with documented assessment and plan.   Atrial fib:  continue anticoag for now if no active bleeding.  Do not resume any AV nodal blocking agents.  HF:  Seems to be euvolemic.  I don't think that repeat echo will change management   Minus Breeding  12:27 PM  09/19/2021

## 2021-09-19 NOTE — Progress Notes (Signed)
Mobility Specialist Progress Note:   09/19/21 1000  Therapy Vitals  Pulse Rate 61  Mobility  Activity Ambulated to bathroom  Level of Assistance Moderate assist, patient does 50-74%  Assistive Device Front wheel walker  Distance Ambulated (ft) 32 ft  Mobility Ambulated with assistance in room;Out of bed for toileting  Mobility Response Tolerated well  Mobility performed by Mobility specialist  $Mobility charge 1 Mobility   Pre Mobility: HR 61 bpm During Mobility: HR 85 bpm Post Mobility: HR 73 bpm  Pt received in bed, agreed to mobility. Pt seemed to have a cognitive deficit today. Ambulated to/from BR 32' with RW and minA. Required modA to stand. Pt states he feels shaky and weak. Gets more stable with distance. Pt left in bed with all needs met.   Nelta Numbers Mobility Specialist  Phone [phone]

## 2021-09-19 NOTE — Evaluation (Signed)
Physical Therapy Evaluation Patient Details Name: Matthew Mahoney MRN: [id number] DOB: 1936/01/24 Today's Date: 09/19/2021  History of Present Illness  Pt is an 85 y/o male admitted 10/12 secondary to symptomatic bradycardia, weakness, and hypotension. Pt also with RLE cellulitis. PMH includes prostate cancer, CVA, HTN, COPD, alcohol abuse, tobacco use, CHF, and afib.  Clinical Impression  Pt admitted secondary to problem above with deficits below. Pt very unsteady and with difficulty performing mobility tasks. Required mod A +2 to stand from lower surfaces. Pt with LOB when returning to sitting in chair and required mod A +2 for controlled descent. Pt requiring min A +2 for short distance ambulation and required assist with RW use. Pt currently lives alone and has had a decline in function since admission. Recommending SNF level therapies at d/c. Will continue to follow acutely.        Recommendations for follow up therapy are one component of a multi-disciplinary discharge planning process, led by the attending physician.  Recommendations may be updated based on patient status, additional functional criteria and insurance authorization.  Follow Up Recommendations SNF;Supervision for mobility/OOB    Equipment Recommendations  None recommended by PT    Recommendations for Other Services       Precautions / Restrictions Precautions Precautions: Fall Restrictions Weight Bearing Restrictions: No      Mobility  Bed Mobility Overal bed mobility: Needs Assistance Bed Mobility: Supine to Sit     Supine to sit: Min assist;+2 for physical assistance;+2 for safety/equipment     General bed mobility comments: Required assist for trunk elevation    Transfers Overall transfer level: Needs assistance Equipment used: Rolling walker (2 wheeled) Transfers: Sit to/from Stand Sit to Stand: Min assist;+2 safety/equipment;+2 physical assistance;Mod assist         General transfer  comment: Min A for steadying to stand from EOB. However, from lower toilet surface required mod A +2 to stand. Pt losing balance when returning to sitting in chair and required mod A +2 for controlled descent.  Ambulation/Gait Ambulation/Gait assistance: Min assist;+2 safety/equipment;+2 physical assistance Gait Distance (Feet): 20 Feet Assistive device: Rolling walker (2 wheeled) Gait Pattern/deviations: Step-through pattern;Decreased stride length;Trunk flexed Gait velocity: Decreased   General Gait Details: Unsteady gait and limited endurance noted. Pt very shaky throughout. Required assist for safe RW use as well.  Stairs            Wheelchair Mobility    Modified Rankin (Stroke Patients Only)       Balance Overall balance assessment: Needs assistance Sitting-balance support: No upper extremity supported;Feet supported Sitting balance-Leahy Scale: Fair     Standing balance support: Bilateral upper extremity supported;During functional activity Standing balance-Leahy Scale: Poor Standing balance comment: Reliant on BUE support                             Pertinent Vitals/Pain Pain Assessment: Faces Faces Pain Scale: Hurts little more Pain Location: RLE Pain Descriptors / Indicators: Guarding;Grimacing Pain Intervention(s): Monitored during session;Limited activity within patient's tolerance;Repositioned    Home Living Family/patient expects to be discharged to:: Private residence Living Arrangements: Alone Available Help at Discharge: Family;Available PRN/intermittently Type of Home: House Home Access: Stairs to enter Entrance Stairs-Rails: None Entrance Stairs-Number of Steps: 2 Home Layout: One level Home Equipment: Walker - 2 wheels;Cane - single point      Prior Function Level of Independence: Needs assistance   Gait / Transfers Assistance Needed: Uses RW for  inside of home. Uses a cane to manage steps.  ADL's / Homemaking Assistance  Needed: Reports sister assists with medicine management. Still drives.        Hand Dominance        Extremity/Trunk Assessment   Upper Extremity Assessment Upper Extremity Assessment: Defer to OT evaluation    Lower Extremity Assessment Lower Extremity Assessment: Generalized weakness;RLE deficits/detail RLE Deficits / Details: RLE wrapped    Cervical / Trunk Assessment Cervical / Trunk Assessment: Kyphotic  Communication   Communication: HOH  Cognition Arousal/Alertness: Awake/alert Behavior During Therapy: WFL for tasks assessed/performed Overall Cognitive Status: Impaired/Different from baseline Area of Impairment: Memory                     Memory: Decreased short-term memory         General Comments: Questionable memory deficits noted throughout.      General Comments General comments (skin integrity, edema, etc.): Pt's brother and sister in law present    Exercises     Assessment/Plan    PT Assessment Patient needs continued PT services  PT Problem List Decreased strength;Decreased activity tolerance;Decreased balance;Decreased mobility;Decreased knowledge of use of DME;Decreased knowledge of precautions;Decreased safety awareness;Pain       PT Treatment Interventions DME instruction;Gait training;Stair training;Functional mobility training;Therapeutic activities;Therapeutic exercise;Balance training;Patient/family education    PT Goals (Current goals can be found in the Care Plan section)  Acute Rehab PT Goals Patient Stated Goal: to get stronger PT Goal Formulation: With patient Time For Goal Achievement: 10/03/21 Potential to Achieve Goals: Fair    Frequency Min 2X/week   Barriers to discharge        Co-evaluation PT/OT/SLP Co-Evaluation/Treatment: Yes Reason for Co-Treatment: For patient/therapist safety;To address functional/ADL transfers PT goals addressed during session: Mobility/safety with mobility;Balance;Proper use of DME          AM-PAC PT "6 Clicks" Mobility  Outcome Measure Help needed turning from your back to your side while in a flat bed without using bedrails?: A Little Help needed moving from lying on your back to sitting on the side of a flat bed without using bedrails?: A Lot Help needed moving to and from a bed to a chair (including a wheelchair)?: A Little Help needed standing up from a chair using your arms (e.g., wheelchair or bedside chair)?: Total Help needed to walk in hospital room?: A Lot Help needed climbing 3-5 steps with a railing? : Total 6 Click Score: 12    End of Session Equipment Utilized During Treatment: Gait belt Activity Tolerance: Patient tolerated treatment well Patient left: in chair;with call bell/phone within reach;with chair alarm set;with family/visitor present Nurse Communication: Mobility status PT Visit Diagnosis: Unsteadiness on feet (R26.81);Muscle weakness (generalized) (M62.81);History of falling (Z91.81);Repeated falls (R29.6)    Time: 1761-6073 PT Time Calculation (min) (ACUTE ONLY): 22 min   Charges:   PT Evaluation $PT Eval Moderate Complexity: 1 Mod          Reuel Derby, PT, DPT  Acute Rehabilitation Services  Pager: [phone] Office: [phone]   Rudean Hitt 09/19/2021, 2:36 PM

## 2021-09-20 DIAGNOSIS — R001 Bradycardia, unspecified: Secondary | ICD-10-CM | POA: Diagnosis not present

## 2021-09-20 LAB — CBC
HCT: 25 % — ABNORMAL LOW (ref 39.0–52.0)
Hemoglobin: 8.2 g/dL — ABNORMAL LOW (ref 13.0–17.0)
MCH: 31.1 pg (ref 26.0–34.0)
MCHC: 32.8 g/dL (ref 30.0–36.0)
MCV: 94.7 fL (ref 80.0–100.0)
Platelets: 117 10*3/uL — ABNORMAL LOW (ref 150–400)
RBC: 2.64 MIL/uL — ABNORMAL LOW (ref 4.22–5.81)
RDW: 15.3 % (ref 11.5–15.5)
WBC: 6.1 10*3/uL (ref 4.0–10.5)
nRBC: 0 % (ref 0.0–0.2)

## 2021-09-20 LAB — BASIC METABOLIC PANEL
Anion gap: 10 (ref 5–15)
BUN: 38 mg/dL — ABNORMAL HIGH (ref 8–23)
CO2: 21 mmol/L — ABNORMAL LOW (ref 22–32)
Calcium: 8.4 mg/dL — ABNORMAL LOW (ref 8.9–10.3)
Chloride: 106 mmol/L (ref 98–111)
Creatinine, Ser: 1.41 mg/dL — ABNORMAL HIGH (ref 0.61–1.24)
GFR, Estimated: 49 mL/min — ABNORMAL LOW (ref >60)
Glucose, Bld: 102 mg/dL — ABNORMAL HIGH (ref 70–99)
Potassium: 4.1 mmol/L (ref 3.5–5.1)
Sodium: 137 mmol/L (ref 135–145)

## 2021-09-20 LAB — FERRITIN: Ferritin: 288 ng/mL (ref 24–336)

## 2021-09-20 LAB — RETICULOCYTES
Immature Retic Fract: 16.6 % — ABNORMAL HIGH (ref 2.3–15.9)
RBC.: 2.79 MIL/uL — ABNORMAL LOW (ref 4.22–5.81)
Retic Count, Absolute: 41.6 10*3/uL (ref 19.0–186.0)
Retic Ct Pct: 1.5 % (ref 0.4–3.1)

## 2021-09-20 LAB — HEMOGLOBIN AND HEMATOCRIT, BLOOD
HCT: 26.8 % — ABNORMAL LOW (ref 39.0–52.0)
HCT: 30.4 % — ABNORMAL LOW (ref 39.0–52.0)
Hemoglobin: 8.7 g/dL — ABNORMAL LOW (ref 13.0–17.0)
Hemoglobin: 9.8 g/dL — ABNORMAL LOW (ref 13.0–17.0)

## 2021-09-20 LAB — IRON AND TIBC
Iron: 47 ug/dL (ref 45–182)
Saturation Ratios: 30 % (ref 17.9–39.5)
TIBC: 158 ug/dL — ABNORMAL LOW (ref 250–450)
UIBC: 111 ug/dL

## 2021-09-20 MED ORDER — LACTATED RINGERS IV SOLN: INTRAVENOUS | Status: DC

## 2021-09-20 MED ORDER — AMOXICILLIN-POT CLAVULANATE 875-125 MG PO TABS
1.0000 | ORAL_TABLET | Freq: Two times a day (BID) | ORAL | Status: DC
  Administered 2021-09-20 – 2021-09-24 (×8): 1 via ORAL
  Filled 2021-09-20 (×9): qty 1

## 2021-09-20 MED ORDER — PANTOPRAZOLE SODIUM 40 MG IV SOLR
40.0000 mg | Freq: Two times a day (BID) | INTRAVENOUS | Status: DC
  Administered 2021-09-20 – 2021-09-24 (×8): 40 mg via INTRAVENOUS
  Filled 2021-09-20 (×8): qty 40

## 2021-09-20 MED ORDER — LACTATED RINGERS IV SOLN: INTRAVENOUS | Status: AC

## 2021-09-20 NOTE — NC FL2 (Signed)
[place] LEVEL OF CARE SCREENING TOOL     IDENTIFICATION  Patient Name: Matthew Mahoney Birthdate: 1936-04-28 Sex: male Admission Date (Current Location): 09/17/2021  [facility] and [place] Number:  Herbalist and Address:  [facility] [place] [place] [address] [place], [place] [address]      Provider Number: [id number]  Attending Physician Name and Address:  Kayleen Memos, DO  Relative Name and Phone Number:  Swaim,[name] (Relative)   [phone]    Current Level of Care: Hospital Recommended Level of Care: [place] Prior Approval Number:    Date Approved/Denied:   PASRR Number:    Discharge Plan: SNF    Current Diagnoses: Patient Active Problem List   Diagnosis Date Noted   Pressure injury of skin 09/18/2021   Protein-calorie malnutrition, severe 09/18/2021   Ambulatory dysfunction 09/17/2021   Symptomatic bradycardia 09/17/2021   Abrasion of lower leg with infection 09/17/2021   Peripheral arterial disease ([place]) 01/20/2017   Nonsustained ventricular tachycardia 05/09/2015   Closed fracture of femur, neck ([place]) 05/06/2015   Chronic combined systolic and diastolic congestive heart failure ([place]) 05/06/2015   GERD (gastroesophageal reflux disease) 10/11/2013   CAD (coronary artery disease) 10/11/2013   Atrial flutter ([place]) 09/25/2013   Conduction disorder of the heart 09/25/2013   Thrombocytopenia ([place]) 09/25/2013   Atrial fibrillation ([place]) 09/23/2013   Secondary cardiomyopathy, unspecified 09/23/2013   CVA (cerebral infarction) 09/19/2013   CKD (chronic kidney disease), stage III ([place]) 09/19/2013   Alcohol abuse 01/21/2013   Tobacco abuse 01/21/2013   Hyponatremia 01/21/2013   Hypertension    COPD (chronic obstructive pulmonary disease) ([place])    Choledocholithiasis    Coronary arteriosclerosis    Hyperlipidemia    Ichthyosis    Mitral regurgitation    Onychomycosis    Vitamin D deficiency    Aortic  stenosis    Prostate cancer ([place])    Ascites    History of colonic diverticulitis    Peripheral vascular disease ([place]) 02/18/2011   OTHER SYMPTOMS INVOLVING CARDIOVASCULAR SYSTEM 02/18/2011    Orientation RESPIRATION BLADDER Height & Weight     Self  Normal Incontinent Weight: 176 lb 5.9 oz (80 kg) Height:  6' (182.9 cm)  BEHAVIORAL SYMPTOMS/MOOD NEUROLOGICAL BOWEL NUTRITION STATUS      Incontinent Diet (see d/c summary)  AMBULATORY STATUS COMMUNICATION OF NEEDS Skin   Limited Assist Verbally PU Stage and Appropriate Care                       Personal Care Assistance Level of Assistance  Bathing, Feeding, Dressing Bathing Assistance: Maximum assistance Feeding assistance: Limited assistance Dressing Assistance: Limited assistance     Functional Limitations Info  Sight, Hearing, Speech Sight Info: Impaired Hearing Info: Adequate Speech Info: Adequate    SPECIAL CARE FACTORS FREQUENCY  PT (By licensed PT), OT (By licensed OT)     PT Frequency: 5x/ week OT Frequency: 5x/ week            Contractures Contractures Info: Not present    Additional Factors Info  Code Status, Allergies Code Status Info: Full Allergies Info: No Active Allergies           Current Medications (09/20/2021):  This is the current hospital active medication list Current Facility-Administered Medications  Medication Dose Route Frequency Provider Last Rate Last Admin   acetaminophen (TYLENOL) tablet 650 mg  650 mg Oral Q6H PRN Karmen Bongo, MD  Or   acetaminophen (TYLENOL) suppository 650 mg  650 mg Rectal Q6H PRN Karmen Bongo, MD       albuterol (PROVENTIL) (2.5 MG/3ML) 0.083% nebulizer solution 2.5 mg  2.5 mg Inhalation Q6H PRN Karmen Bongo, MD       amLODipine (NORVASC) tablet 2.5 mg  2.5 mg Oral Daily Karmen Bongo, MD   2.5 mg at 09/20/21 0934   atorvastatin (LIPITOR) tablet 40 mg  40 mg Oral QPM Karmen Bongo, MD   40 mg at 09/19/21 1654   bisacodyl  (DULCOLAX) EC tablet 5 mg  5 mg Oral Daily PRN Karmen Bongo, MD       cefTRIAXone (ROCEPHIN) 2 g in sodium chloride 0.9 % 100 mL IVPB  2 g Intravenous Q24H Hall, Carole N, DO 200 mL/hr at 09/19/21 2039 2 g at 09/19/21 2039   docusate sodium (COLACE) capsule 100 mg  100 mg Oral BID Karmen Bongo, MD   100 mg at 09/20/21 0934   feeding supplement (ENSURE ENLIVE / ENSURE PLUS) liquid 237 mL  237 mL Oral BID BM Little Ishikawa, MD   237 mL at 09/20/21 0935   fluticasone furoate-vilanterol (BREO ELLIPTA) 100-25 MCG/INH 1 puff  1 puff Inhalation Daily Karmen Bongo, MD   1 puff at 70/17/79 3903   folic acid (FOLVITE) tablet 1 mg  1 mg Oral Daily Karmen Bongo, MD   1 mg at 09/20/21 0092   hydrALAZINE (APRESOLINE) injection 5 mg  5 mg Intravenous Q4H PRN Karmen Bongo, MD       HYDROcodone-acetaminophen (NORCO/VICODIN) 5-325 MG per tablet 1-2 tablet  1-2 tablet Oral Q4H PRN Karmen Bongo, MD       lactated ringers infusion   Intravenous Continuous Kayleen Memos, DO 30 mL/hr at 09/20/21 0641 Rate Change at 09/20/21 0641   latanoprost (XALATAN) 0.005 % ophthalmic solution 1 drop  1 drop Both Eyes QHS Karmen Bongo, MD   1 drop at 09/19/21 2044   LORazepam (ATIVAN) tablet 1-4 mg  1-4 mg Oral Q1H PRN Karmen Bongo, MD       Or   LORazepam (ATIVAN) injection 1-4 mg  1-4 mg Intravenous Q1H PRN Karmen Bongo, MD       metroNIDAZOLE (FLAGYL) tablet 500 mg  500 mg Oral Q12H Hall, Carole N, DO   500 mg at 09/20/21 3300   morphine 2 MG/ML injection 2 mg  2 mg Intravenous Q2H PRN Karmen Bongo, MD       multivitamin with minerals tablet 1 tablet  1 tablet Oral Daily Karmen Bongo, MD   1 tablet at 09/20/21 0934   nicotine (NICODERM CQ - dosed in mg/24 hours) patch 14 mg  14 mg Transdermal Daily Karmen Bongo, MD   14 mg at 09/20/21 7622   nutrition supplement (JUVEN) (JUVEN) powder packet 1 packet  1 packet Oral BID BM Little Ishikawa, MD   1 packet at 09/20/21 0935   ondansetron  (ZOFRAN) tablet 4 mg  4 mg Oral Q6H PRN Karmen Bongo, MD       Or   ondansetron [facility]) injection 4 mg  4 mg Intravenous Q6H PRN Karmen Bongo, MD       pantoprazole (PROTONIX) EC tablet 40 mg  40 mg Oral Daily Karmen Bongo, MD   40 mg at 09/20/21 0934   polyethylene glycol (MIRALAX / GLYCOLAX) packet 17 g  17 g Oral Daily PRN Karmen Bongo, MD       sacubitril-valsartan (ENTRESTO) 49-51 mg per tablet  1 tablet Oral BID Karmen Bongo, MD   1 tablet at 09/20/21 0934   sodium chloride flush (NS) 0.9 % injection 3 mL  3 mL Intravenous Q12H Karmen Bongo, MD   3 mL at 09/20/21 0935   thiamine tablet 100 mg  100 mg Oral Daily Karmen Bongo, MD   100 mg at 09/20/21 0934   umeclidinium bromide (INCRUSE ELLIPTA) 62.5 MCG/INH 1 puff  1 puff Inhalation Daily Karmen Bongo, MD   1 puff at 09/20/21 5638     Discharge Medications: Please see discharge summary for a list of discharge medications.  Relevant Imaging Results:  Relevant Lab Results:   Additional Information SSN:  [id number]; 6' 176lbs  Sharon Stapel F Lorina Duffner, LCSWA

## 2021-09-20 NOTE — Progress Notes (Addendum)
PROGRESS NOTE  Matthew Mahoney IPJ:[id number] DOB: [birth year] DOA: 09/17/2021 PCP: [name], MD  HPI/Recap of past 24 hours:  Matthew Mahoney is a 86 y.o. male with medical history significant of ETOH abuse; afib on Eliquis; chronic combined CHF; COPD; CVA; HTN; HLD; prostate CA; and PVD presenting with weakness.  He reports that his R leg - he fell about 2 weeks ago - is all bruised up and not healing well and doesn't look good and it hurts all the time and he is having trouble walking on it.  His legs have been "weak and rubbery" for a while and he does not get along well.  Mild chest pain.  No SOB and he does not wear O2 at home.  He does feel like he is weak and dragging. TR[facility] called to admit.  Seen by cardiology no further plans regarding his bradycardia.  No plan for PPM.  Ongoing IV antibiotics for right lower extremity cellulitis.  09/20/21: Breathing is improved.  No new complaints this morning.  Assessment/Plan: Principal Problem:   Symptomatic bradycardia Active Problems:   Peripheral vascular disease (HCC)   Hypertension   COPD (chronic obstructive pulmonary disease) (HCC)   Hyperlipidemia   Alcohol abuse   Tobacco abuse   Atrial fibrillation (HCC)   Chronic combined systolic and diastolic congestive heart failure (HCC)   Ambulatory dysfunction   Abrasion of lower leg with infection   Pressure injury of skin   Protein-calorie malnutrition, severe  Right lower extremity cellulitis, POA. Currently on Rocephin and p.o. Flagyl, DC'd on 09/18/2021 Augmentin twice daily started on 09/20/2021.  Cellulitis is much improved.  Afebrile with no leukocytosis.  Worsening acute blood loss anemia Hemoglobin dropped 9.4 on admission from 12.4 in 2016 Hemoglobin this morning 8.2 from 9.2 with MCV of 94 No overt bleeding however history of duodenal ulcer from EGD done on 2014 On daily PPI prior to admission. Obtain FOBT Add on iron studies. Eliquis held by cardiology on  09/20/2021 until anemia has resolved. GI consulted  History of duodenal ulcers Will start IV PPI, Protonix 40 mg twice daily until seen by GI Trend H&H every 6 hours Dr. Alessandra Bevels will see in consultation, n.p.o. after midnight for possible endoscopy by GI.  Improving AKI, suspect prerenal in the setting of dehydration Baseline creatinine appears to be 1.1 with GFR greater than 60 Creatinine is downtrending 1.4 from 1.60. Continue to monitor urine output, 1.1 L recorded in the last 24 hours. Continue to avoid nephrotoxic agents.  Resolving Non anion gap metabolic acidosis Anion gap 9 serum bicarb 19 on 09/19/2021. Anion gap 10, serum bicarb 21 on 09/20/2021. Suspect in the setting of acute renal insufficiency. He received gentle IV fluid hydration.  Atrial fibrillation with slow ventricular response, slow ventricular response has resolved, off AV nodal blockade agents. On Eliquis for CVA prevention He is on 2.5 mg Norvasc, continue Per cardiology keep off AV nodal blocking agents.  Resolved symptomatic bradycardia Seen by cardiology, no plan for PPM. Per cardiology keep off AV nodal blocking agents.  Ambulatory dysfunction with multiple falls Continue to mobilize as tolerated Continue PT OT with assistance and fall precautions PT OT recommending SNF TOC assisting with DC planning and SNF placement  Alcohol use disorder No evidence of alcohol withdrawal at the time of the visit Continue CIWA protocol.     Code Status: Full code.  Family Communication: None at bedside  Disposition Plan: Likely will discharge to SNF.  Consultants: Cardiology GI   Procedures: None  Antimicrobials: Rocephin and P.o. Flagyl, DC'd on 09/20/2021. Augmentin started on 09/20/2021.  DVT prophylaxis: SCDs, Eliquis held on 09/20/2021 by cardiology due to anemia.  Status is: Inpatient           Objective: Vitals:   09/20/21 0310 09/20/21 0755 09/20/21 0905 09/20/21  1114  BP: (!) 144/94 138/84  129/66  Pulse: 63 70  61  Resp: 18 17  18   Temp: 98.2 F (36.8 C) 98.1 F (36.7 C)  (!) 97.4 F (36.3 C)  TempSrc: Oral Oral  Oral  SpO2: 97% 98% 97% 100%  Weight:      Height:        Intake/Output Summary (Last 24 hours) at 09/20/2021 1538 Last data filed at 09/20/2021 1314 Gross per 24 hour  Intake 989.36 ml  Output 1150 ml  Net -160.64 ml   Filed Weights   09/17/21 1141  Weight: 80 kg    Exam:  General: 85 y.o. year-old male well-developed well-nourished in no acute distress.  He is alert oriented x3. Neuro: Alert and awake. Cardiovascular: Irregular rate and rhythm no rubs or gallops. Respiratory: Clear to auscultation with no wheezes or rales.  Abdomen: Soft nontender normal bowel sounds present.  Musculoskeletal: Trace lower extremity edema bilaterally. Skin: Right lower extremity wrapped in surgical dressing.  Psychiatry: Mood is appropriate for condition and setting.  Data Reviewed: CBC: Recent Labs  Lab 09/17/21 1149 09/18/21 0222 09/18/21 0432 09/19/21 0536 09/20/21 0131  WBC 7.4 QUESTIONABLE RESULTS, RECOMMEND RECOLLECT TO VERIFY 7.8 7.6 6.1  NEUTROABS 5.0  --   --   --   --   HGB 9.4* QUESTIONABLE RESULTS, RECOMMEND RECOLLECT TO VERIFY 8.7* 9.2* 8.2*  HCT 29.3* QUESTIONABLE RESULTS, RECOMMEND RECOLLECT TO VERIFY 26.1* 27.6* 25.0*  MCV 97.7 QUESTIONABLE RESULTS, RECOMMEND RECOLLECT TO VERIFY 93.9 94.8 94.7  PLT 121* QUESTIONABLE RESULTS, RECOMMEND RECOLLECT TO VERIFY 112* 133* 027*   Basic Metabolic Panel: Recent Labs  Lab 09/17/21 1149 09/18/21 0222 09/19/21 0536 09/20/21 0624  NA 136 133* 136 137  K 5.5* 4.4 4.0 4.1  CL 107 108 108 106  CO2 19* 14* 19* 21*  GLUCOSE 101* 83 107* 102*  BUN 56* 39* 41* 38*  CREATININE 1.99* 1.18 1.60* 1.41*  CALCIUM 8.3* 7.3* 8.0* 8.4*   GFR: Estimated Creatinine Clearance: 43.6 mL/min (A) (by C-G formula based on SCr of 1.41 mg/dL (H)). Liver Function Tests: Recent Labs   Lab 09/17/21 1149  AST 13*  ALT 8  ALKPHOS 67  BILITOT 1.0  PROT 5.5*  ALBUMIN 2.8*   No results for input(s): LIPASE, AMYLASE in the last 168 hours. No results for input(s): AMMONIA in the last 168 hours. Coagulation Profile: No results for input(s): INR, PROTIME in the last 168 hours. Cardiac Enzymes: No results for input(s): CKTOTAL, CKMB, CKMBINDEX, TROPONINI in the last 168 hours. BNP (last 3 results) No results for input(s): PROBNP in the last 8760 hours. HbA1C: No results for input(s): HGBA1C in the last 72 hours. CBG: No results for input(s): GLUCAP in the last 168 hours. Lipid Profile: No results for input(s): CHOL, HDL, LDLCALC, TRIG, CHOLHDL, LDLDIRECT in the last 72 hours. Thyroid Function Tests: Recent Labs    09/17/21 1848  TSH 2.224   Anemia Panel: No results for input(s): VITAMINB12, FOLATE, FERRITIN, TIBC, IRON, RETICCTPCT in the last 72 hours. Urine analysis:    Component Value Date/Time   COLORURINE YELLOW 05/06/2015 0038   APPEARANCEUR CLEAR 05/06/2015  0038   LABSPEC 1.012 05/06/2015 0038   PHURINE 5.5 05/06/2015 0038   GLUCOSEU NEGATIVE 05/06/2015 0038   HGBUR NEGATIVE 05/06/2015 0038   BILIRUBINUR NEGATIVE 05/06/2015 0038   KETONESUR NEGATIVE 05/06/2015 0038   PROTEINUR 100 (A) 05/06/2015 0038   UROBILINOGEN 0.2 05/06/2015 0038   NITRITE NEGATIVE 05/06/2015 0038   LEUKOCYTESUR NEGATIVE 05/06/2015 0038   Sepsis Labs: @LABRCNTIP (procalcitonin:4,lacticidven:4)  ) Recent Results (from the past 240 hour(s))  Resp Panel by RT-PCR (Flu A&B, Covid) Nasopharyngeal Swab     Status: None   Collection Time: 09/17/21 11:50 AM   Specimen: Nasopharyngeal Swab; Nasopharyngeal(NP) swabs in vial transport medium  Result Value Ref Range Status   SARS Coronavirus 2 by RT PCR NEGATIVE NEGATIVE Final    Comment: (NOTE) SARS-CoV-2 target nucleic acids are NOT DETECTED.  The SARS-CoV-2 RNA is generally detectable in upper respiratory specimens during the  acute phase of infection. The lowest concentration of SARS-CoV-2 viral copies this assay can detect is 138 copies/mL. A negative result does not preclude SARS-Cov-2 infection and should not be used as the sole basis for treatment or other patient management decisions. A negative result may occur with  improper specimen collection/handling, submission of specimen other than nasopharyngeal swab, presence of viral mutation(s) within the areas targeted by this assay, and inadequate number of viral copies(<138 copies/mL). A negative result must be combined with clinical observations, patient history, and epidemiological information. The expected result is Negative.  Fact Sheet for Patients:  EntrepreneurPulse.com.au  Fact Sheet for Healthcare Providers:  IncredibleEmployment.be  This test is no t yet approved or cleared by the [place] FDA and  has been authorized for detection and/or diagnosis of SARS-CoV-2 by FDA under an Emergency Use Authorization (EUA). This EUA will remain  in effect (meaning this test can be used) for the duration of the COVID-19 declaration under Section 564(b)(1) of the Act, 21 U.S.C.section 360bbb-3(b)(1), unless the authorization is terminated  or revoked sooner.       Influenza A by PCR NEGATIVE NEGATIVE Final   Influenza B by PCR NEGATIVE NEGATIVE Final    Comment: (NOTE) The Xpert Xpress SARS-CoV-2/FLU/RSV plus assay is intended as an aid in the diagnosis of influenza from Nasopharyngeal swab specimens and should not be used as a sole basis for treatment. Nasal washings and aspirates are unacceptable for Xpert Xpress SARS-CoV-2/FLU/RSV testing.  Fact Sheet for Patients: EntrepreneurPulse.com.au  Fact Sheet for Healthcare Providers: IncredibleEmployment.be  This test is not yet approved or cleared by the [place] FDA and has been authorized for detection and/or  diagnosis of SARS-CoV-2 by FDA under an Emergency Use Authorization (EUA). This EUA will remain in effect (meaning this test can be used) for the duration of the COVID-19 declaration under Section 564(b)(1) of the Act, 21 U.S.C. section 360bbb-3(b)(1), unless the authorization is terminated or revoked.  Performed at [facility], [place] [address] [place], [place] [address]       Studies: No results found.  Scheduled Meds:  amLODipine  2.5 mg Oral Daily   atorvastatin  40 mg Oral QPM   docusate sodium  100 mg Oral BID   feeding supplement  237 mL Oral BID BM   fluticasone furoate-vilanterol  1 puff Inhalation Daily   folic acid  1 mg Oral Daily   latanoprost  1 drop Both Eyes QHS   metroNIDAZOLE  500 mg Oral Q12H   multivitamin with minerals  1 tablet Oral Daily   nicotine  14 mg Transdermal  Daily   nutrition supplement (JUVEN)  1 packet Oral BID BM   pantoprazole  40 mg Oral Daily   sacubitril-valsartan  1 tablet Oral BID   sodium chloride flush  3 mL Intravenous Q12H   thiamine  100 mg Oral Daily   umeclidinium bromide  1 puff Inhalation Daily    Continuous Infusions:  cefTRIAXone (ROCEPHIN)  IV 2 g (09/19/21 2039)   lactated ringers 30 mL/hr at 09/20/21 0641     LOS: 3 days     Kayleen Memos, MD [facility] Pager [phone]  If 7PM-7AM, please contact night-coverage [email or web address] Password [email or web address] 09/20/2021, 3:38 PM

## 2021-09-20 NOTE — Plan of Care (Signed)
  Problem: Clinical Measurements: Goal: Will remain free from infection Outcome: Progressing Goal: Respiratory complications will improve Outcome: Progressing Goal: Cardiovascular complication will be avoided Outcome: Progressing   Problem: Health Behavior/Discharge Planning: Goal: Ability to manage health-related needs will improve Outcome: Not Progressing   

## 2021-09-20 NOTE — Progress Notes (Signed)
Cardiology Progress Note  Patient ID: Matthew Mahoney MRN: [id number] DOB: 05/27/1936 Date of Encounter: 09/20/2021  Primary Cardiologist: Mertie Moores, MD  Subjective   Chief Complaint: None.   HPI: Heart rate 70 to 80s while talking to me.  Goes up to the 90s when he moves in bed.  Did have some bradycardia overnight but not an issue.  ROS:  All other ROS reviewed and negative. Pertinent positives noted in the HPI.     Inpatient Medications  Scheduled Meds:  amLODipine  2.5 mg Oral Daily   apixaban  2.5 mg Oral BID   atorvastatin  40 mg Oral QPM   docusate sodium  100 mg Oral BID   feeding supplement  237 mL Oral BID BM   fluticasone furoate-vilanterol  1 puff Inhalation Daily   folic acid  1 mg Oral Daily   latanoprost  1 drop Both Eyes QHS   metroNIDAZOLE  500 mg Oral Q12H   multivitamin with minerals  1 tablet Oral Daily   nicotine  14 mg Transdermal Daily   nutrition supplement (JUVEN)  1 packet Oral BID BM   pantoprazole  40 mg Oral Daily   sacubitril-valsartan  1 tablet Oral BID   sodium chloride flush  3 mL Intravenous Q12H   thiamine  100 mg Oral Daily   umeclidinium bromide  1 puff Inhalation Daily   Continuous Infusions:  cefTRIAXone (ROCEPHIN)  IV 2 g (09/19/21 2039)   lactated ringers 30 mL/hr at 09/20/21 0641   PRN Meds: acetaminophen **OR** acetaminophen, albuterol, bisacodyl, hydrALAZINE, HYDROcodone-acetaminophen, LORazepam **OR** LORazepam, morphine injection, ondansetron **OR** ondansetron (ZOFRAN) IV, polyethylene glycol   Vital Signs   Vitals:   09/19/21 2308 09/20/21 0310 09/20/21 0755 09/20/21 0905  BP: 119/74 (!) 144/94 138/84   Pulse: 64 63 70   Resp: 18 18 17    Temp: 98.9 F (37.2 C) 98.2 F (36.8 C) 98.1 F (36.7 C)   TempSrc: Oral Oral Oral   SpO2: 97% 97% 98% 97%  Weight:      Height:        Intake/Output Summary (Last 24 hours) at 09/20/2021 1034 Last data filed at 09/20/2021 0303 Gross per 24 hour  Intake 1219.36 ml   Output 1150 ml  Net 69.36 ml   Last 3 Weights 09/17/2021 11/21/2018 02/26/2017  Weight (lbs) 176 lb 5.9 oz 195 lb 204 lb 3.2 oz  Weight (kg) 80 kg 88.451 kg 92.625 kg      Telemetry  Overnight telemetry shows A. fib heart rate in 30s to 90 bpm, he does have values above 100, which I personally reviewed.   ECG  The most recent ECG shows atrial fibrillation heart rate 44, right bundle branch block with left anterior fascicular block, which I personally reviewed.   Physical Exam   Vitals:   09/19/21 2308 09/20/21 0310 09/20/21 0755 09/20/21 0905  BP: 119/74 (!) 144/94 138/84   Pulse: 64 63 70   Resp: 18 18 17    Temp: 98.9 F (37.2 C) 98.2 F (36.8 C) 98.1 F (36.7 C)   TempSrc: Oral Oral Oral   SpO2: 97% 97% 98% 97%  Weight:      Height:        Intake/Output Summary (Last 24 hours) at 09/20/2021 1034 Last data filed at 09/20/2021 0303 Gross per 24 hour  Intake 1219.36 ml  Output 1150 ml  Net 69.36 ml    Last 3 Weights 09/17/2021 11/21/2018 02/26/2017  Weight (lbs) 176 lb 5.9 oz  195 lb 204 lb 3.2 oz  Weight (kg) 80 kg 88.451 kg 92.625 kg    Body mass index is 23.92 kg/m.  General: Well nourished, well developed, in no acute distress Head: Atraumatic, normal size  Eyes: PEERLA, EOMI  Neck: Supple, no JVD Endocrine: No thryomegaly Cardiac: Normal S1, S2; irregular rhythm, no murmurs Lungs: Clear to auscultation bilaterally, no wheezing, rhonchi or rales  Abd: Soft, nontender, no hepatomegaly  Ext: No edema, pulses 2+ Musculoskeletal: No deformities, BUE and BLE strength normal and equal Skin: Warm and dry, no rashes   Neuro: Alert and oriented to person, place, time, and situation, CNII-XII grossly intact, no focal deficits  Psych: Normal mood and affect   Labs  High Sensitivity Troponin:   Recent Labs  Lab 09/17/21 1149 09/17/21 1345  TROPONINIHS 10 5     Cardiac EnzymesNo results for input(s): TROPONINI in the last 168 hours. No results for input(s):  TROPIPOC in the last 168 hours.  Chemistry Recent Labs  Lab 09/17/21 1149 09/18/21 0222 09/19/21 0536 09/20/21 0624  NA 136 133* 136 137  K 5.5* 4.4 4.0 4.1  CL 107 108 108 106  CO2 19* 14* 19* 21*  GLUCOSE 101* 83 107* 102*  BUN 56* 39* 41* 38*  CREATININE 1.99* 1.18 1.60* 1.41*  CALCIUM 8.3* 7.3* 8.0* 8.4*  PROT 5.5*  --   --   --   ALBUMIN 2.8*  --   --   --   AST 13*  --   --   --   ALT 8  --   --   --   ALKPHOS 67  --   --   --   BILITOT 1.0  --   --   --   GFRNONAA 33* >60 42* 49*  ANIONGAP 10 11 9 10     Hematology Recent Labs  Lab 09/18/21 0432 09/19/21 0536 09/20/21 0131  WBC 7.8 7.6 6.1  RBC 2.78* 2.91* 2.64*  HGB 8.7* 9.2* 8.2*  HCT 26.1* 27.6* 25.0*  MCV 93.9 94.8 94.7  MCH 31.3 31.6 31.1  MCHC 33.3 33.3 32.8  RDW 15.3 15.3 15.3  PLT 112* 133* 117*   BNP Recent Labs  Lab 09/17/21 1149  BNP 353.6*    DDimer No results for input(s): DDIMER in the last 168 hours.   Radiology  VAS [place] ABI WITH/WO TBI  Result Date: 09/19/2021  LOWER EXTREMITY DOPPLER STUDY Patient Name:  Matthew Mahoney  Date of Exam:   09/18/2021 Medical Rec #: [id number]        Accession #:    [id number] Date of Birth: [birth year]        Patient Gender: M Patient Age:   85 years Exam Location:  [facility] Procedure:      VAS [place] ABI WITH/WO TBI Referring Phys: Anderson Malta YATES --------------------------------------------------------------------------------  Indications: Ulceration, and cellulitis. High Risk         Hypertension, hyperlipidemia, coronary artery disease, prior Factors:          CVA. Other Factors: ETOH abuse. Atrial fibrillation, on Eliquis, CHF, COPD, Prostate                cancer, Aortic stenosis, mitral regurgitation,.  Comparison Study: Prior ABI done 02/11/17 Performing Technologist: Sharion Dove RVS  Examination Guidelines: A complete evaluation includes at minimum, Doppler waveform signals and systolic blood pressure reading at the level of bilateral brachial,  anterior tibial, and posterior tibial arteries, when vessel segments are accessible. Bilateral testing  is considered an integral part of a complete examination. Photoelectric Plethysmograph (PPG) waveforms and toe systolic pressure readings are included as required and additional duplex testing as needed. Limited examinations for reoccurring indications may be performed as noted.  ABI Findings: +---------+------------------+-----+----------+--------+ Right    Rt Pressure (mmHg)IndexWaveform  Comment  +---------+------------------+-----+----------+--------+ Brachial 127                    triphasic          +---------+------------------+-----+----------+--------+ PTA      130               1.02 monophasic         +---------+------------------+-----+----------+--------+ DP       125               0.98 monophasic         +---------+------------------+-----+----------+--------+ Great Toe34                0.27                    +---------+------------------+-----+----------+--------+ +---------+------------------+-----+----------+-------+ Left     Lt Pressure (mmHg)IndexWaveform  Comment +---------+------------------+-----+----------+-------+ Brachial 124                    triphasic         +---------+------------------+-----+----------+-------+ PTA      137               1.08 monophasic        +---------+------------------+-----+----------+-------+ DP       127               1.00 monophasic        +---------+------------------+-----+----------+-------+ Great Toe51                0.40                   +---------+------------------+-----+----------+-------+ +-------+-----------+-----------+------------+------------+ ABI/TBIToday's ABIToday's TBIPrevious ABIPrevious TBI +-------+-----------+-----------+------------+------------+ Right  1.02       0.27       0.81        0.66         +-------+-----------+-----------+------------+------------+ Left    1.08       0.40       1.05        0.76         +-------+-----------+-----------+------------+------------+ Arterial wall calcification precludes accurate ankle pressures and ABIs. Right ABIs appear increased compared to prior study on 02/11/17. Left ABIs appear dessentially unchanged compared to prior study on 02/11/17. Bilateral TBIs appear decreased compared to prior study on 02/11/17.  Summary: Right: Resting right ankle-brachial index is within normal range. No evidence of significant right lower extremity arterial disease. The right toe-brachial index is abnormal. Although ankle brachial indices are within normal limits (0.95-1.29), arterial Doppler waveforms at the ankle suggest some component of arterial occlusive disease. Left: Resting left ankle-brachial index is within normal range. No evidence of significant left lower extremity arterial disease. The left toe-brachial index is abnormal. Although ankle brachial indices are within normal limits (0.95-1.29), arterial Doppler waveforms at the ankle suggest some component of arterial occlusive disease.  *See table(s) above for measurements and observations.  Electronically signed by Orlie Pollen on 09/19/2021 at 8:21:26 AM.    Final     Cardiac Studies  TTE 05/06/2015  - Left ventricle: The cavity size was normal. There was mild    concentric hypertrophy. Systolic function was normal. The    estimated  ejection fraction was in the range of 50% to 55%. Wall    motion was normal; there were no regional wall motion    abnormalities. The study was not technically sufficient to allow    evaluation of LV diastolic dysfunction due to atrial    fibrillation.  - Aortic valve: Trileaflet; mildly thickened, mildly calcified    leaflets. There was no regurgitation.  - Aortic root: The aortic root was normal in size.  - Mitral valve: There was no regurgitation.  - Left atrium: The atrium was moderately dilated.  - Right ventricle: Systolic function was  normal.  - Right atrium: The atrium was mildly dilated.  - Tricuspid valve: There was mild regurgitation.  - Pulmonic valve: There was no regurgitation.  - Pulmonary arteries: Systolic pressure was mildly increased. PA    peak pressure: 43 mm Hg (S).  - Inferior vena cava: The vessel was normal in size.  - Pericardium, extracardiac: There was no pericardial effusion.   Patient Profile  SHELLIE GOETTL is a 85 y.o. male with CAD status post PCI, systolic heart failure with recovered EF 50-55%, permanent atrial fibrillation,, stroke, prior GI bleed, alcohol abuse 2022 with weakness and fatigue.  Cardiology was consulted for atrial fibrillation with slow ventricular response.   Assessment & Plan   #Weakness/Fatigue -Suspect multifactorial.  He was admitted with likely dehydration and AKI.  He has new onset anemia.  Alcohol abuse is an issue as well.  Being treated for lower extremity cellulitis. He also was in A. fib with SVR on a beta-blocker.   -Heart rates have much improved off beta-blocker therapy.  Heart rate comes up in the 80s while talking with me.  There is no indication for pacemaker therapy.  He does have conduction disease but simply stopping his beta-blocker. -Really suspect his weakness and fatigue were multifactorial.  Heart rate is not an issue anymore.  #Atrial fibrillation with slow ventricular response #Right bundle branch block/left anterior fascicular block -No evidence of congestive heart failure exam.  He clearly has conduction disease.  However his rates are much improved on beta-blocker therapy.  For now would recommend to hold beta-blocker therapy likely indefinitely.  We will arrange hospital follow-up with his primary cardiologist in 2 to 4 weeks. -He is currently on anticoagulation.  He has had a considerable drop in hemoglobin.  Given falls and anemia my recommendation would be to stop anticoagulation at this time.  Would consider GI evaluation to see if it is safe  undergo anticoagulation.  Until this is worked up would recommend hold anticoagulation.  #Anemia -Hemoglobin continues to decline.  Would recommend hold anticoagulation until this is evaluated.  Would consider GI evaluation while he is here.  [facility] will sign off.   Medication Recommendations: Hold AV nodal agents as detailed above.  No indications for pacemaker therapy at this time.  Holding anticoagulation until anemia has resolved and is worked up. Other recommendations (labs, testing, etc): None. Follow up as an outpatient: We will arrange outpatient hospital follow-up in 2 to 4 weeks with his primary cardiologist.  For questions or updates, please contact [place] Please consult [email or web address] for contact info under   Time Spent with Patient: I have spent a total of 35 minutes with patient reviewing hospital notes, telemetry, EKGs, labs and examining the patient as well as establishing an assessment and plan that was discussed with the patient.  > 50% of time was spent in direct patient care.    Signed,  [place] T. Audie Box, MD, [place]  09/20/2021 10:34 AM

## 2021-09-20 NOTE — TOC Initial Note (Addendum)
Transition of Care [facility]) - Initial/Assessment Note    Patient Details  Name: Matthew Mahoney MRN: [id number] Date of Birth: 12-01-1936  [facility] CM/SW Contact:    Bary Castilla, LCSW Phone Number: [phone] 09/20/2021, 12:48 PM  Clinical Narrative:                  CSW met with patient and pt's sister sue to discuss PT recommendation of a SNF. Patient was aware of recommendation and in agreement with going to a ST SNF. CSW discussed the SNF process.CSW provided patient/[name] with medicare.gov rating list.  Patient gave CSW permission to fax referrals out to local facilities.CSW answered questions about the SNF process and the next steps in the process. [name] asked some more process questions and CSW was able to provide answers and clarity. It is being requested that [name] is able to assist with SNF bed choice once bed offers are available.  Pt is vaccinated and has had both boosters.  TOC team will continue to assist with discharge planning needs.      Expected Discharge Plan: Skilled Nursing Facility Barriers to Discharge: Ship broker, Continued Medical Work up, SNF Pending bed offer   Patient Goals and CMS Choice Patient states their goals for this hospitalization and ongoing recovery are:: To be able to return home CMS Medicare.gov Compare Post Acute Care list provided to:: Patient Choice offered to / list presented to : Patient  Expected Discharge Plan and Services Expected Discharge Plan: [place] arrangements for the past 2 months: Single Family Home                                      Prior Living Arrangements/Services Living arrangements for the past 2 months: Single Family Home Lives with:: Self Patient language and need for interpreter reviewed:: Yes          Care giver support system in place?: Yes (comment)      Activities of Daily Living Home Assistive Devices/Equipment: Cane (specify quad  or straight), Walker (specify type) ADL Screening (condition at time of admission) Patient's cognitive ability adequate to safely complete daily activities?: Yes Is the patient deaf or have difficulty hearing?: No Does the patient have difficulty seeing, even when wearing glasses/contacts?: No Does the patient have difficulty concentrating, remembering, or making decisions?: No Patient able to express need for assistance with ADLs?: Yes Does the patient have difficulty dressing or bathing?: No Independently performs ADLs?: Yes (appropriate for developmental age) Does the patient have difficulty walking or climbing stairs?: Yes Weakness of Legs: Both Weakness of Arms/Hands: None  Permission Sought/Granted      Share Information with NAME: [name]  Permission granted to share info w AGENCY: SNFs  Permission granted to share info w Relationship: sister  Permission granted to share info w Contact Information: [phone]  Emotional Assessment Appearance:: Appears stated age Attitude/Demeanor/Rapport: Engaged Affect (typically observed): Accepting, Appropriate, Adaptable Orientation: : Oriented to Self, Oriented to Place, Oriented to  Time, Oriented to Situation      Admission diagnosis:  Generalized weakness [R53.1] Symptomatic bradycardia [R00.1] Patient Active Problem List   Diagnosis Date Noted   Pressure injury of skin 09/18/2021   Protein-calorie malnutrition, severe 09/18/2021   Ambulatory dysfunction 09/17/2021   Symptomatic bradycardia 09/17/2021   Abrasion of lower leg with infection 09/17/2021  Peripheral arterial disease ([place]) 01/20/2017   Nonsustained ventricular tachycardia 05/09/2015   Closed fracture of femur, neck ([place]) 05/06/2015   Chronic combined systolic and diastolic congestive heart failure ([place]) 05/06/2015   GERD (gastroesophageal reflux disease) 10/11/2013   CAD (coronary artery disease) 10/11/2013   Atrial flutter ([place]) 09/25/2013   Conduction disorder  of the heart 09/25/2013   Thrombocytopenia ([place]) 09/25/2013   Atrial fibrillation ([place]) 09/23/2013   Secondary cardiomyopathy, unspecified 09/23/2013   CVA (cerebral infarction) 09/19/2013   CKD (chronic kidney disease), stage III ([place]) 09/19/2013   Alcohol abuse 01/21/2013   Tobacco abuse 01/21/2013   Hyponatremia 01/21/2013   Hypertension    COPD (chronic obstructive pulmonary disease) ([place])    Choledocholithiasis    Coronary arteriosclerosis    Hyperlipidemia    Ichthyosis    Mitral regurgitation    Onychomycosis    Vitamin D deficiency    Aortic stenosis    Prostate cancer ([place])    Ascites    History of colonic diverticulitis    Peripheral vascular disease ([place]) 02/18/2011   OTHER SYMPTOMS INVOLVING CARDIOVASCULAR SYSTEM 02/18/2011   PCP:  [name], MD Pharmacy:   [id number] [place] [address] [place]Phone: [phone]: [phone]    Social Determinants of Health (SDOH) Interventions    Readmission Risk Interventions No flowsheet data found.

## 2021-09-21 DIAGNOSIS — R001 Bradycardia, unspecified: Secondary | ICD-10-CM | POA: Diagnosis not present

## 2021-09-21 DIAGNOSIS — D649 Anemia, unspecified: Secondary | ICD-10-CM | POA: Diagnosis not present

## 2021-09-21 LAB — CBC
HCT: 28.4 % — ABNORMAL LOW (ref 39.0–52.0)
Hemoglobin: 9.2 g/dL — ABNORMAL LOW (ref 13.0–17.0)
MCH: 30.9 pg (ref 26.0–34.0)
MCHC: 32.4 g/dL (ref 30.0–36.0)
MCV: 95.3 fL (ref 80.0–100.0)
Platelets: 132 10*3/uL — ABNORMAL LOW (ref 150–400)
RBC: 2.98 MIL/uL — ABNORMAL LOW (ref 4.22–5.81)
RDW: 15.5 % (ref 11.5–15.5)
WBC: 7.3 10*3/uL (ref 4.0–10.5)
nRBC: 0 % (ref 0.0–0.2)

## 2021-09-21 LAB — HEMOGLOBIN AND HEMATOCRIT, BLOOD
HCT: 30.1 % — ABNORMAL LOW (ref 39.0–52.0)
Hemoglobin: 9.9 g/dL — ABNORMAL LOW (ref 13.0–17.0)

## 2021-09-21 MED ORDER — SODIUM CHLORIDE 0.9 % IV SOLN: INTRAVENOUS | Status: DC

## 2021-09-21 NOTE — Progress Notes (Signed)
Mobility Specialist Progress Note:   09/21/21 1130  Therapy Vitals  Pulse Rate 75  Mobility  Activity Ambulated to bathroom  Level of Assistance Moderate assist, patient does 50-74%  Assistive Device Front wheel walker  Distance Ambulated (ft) 35 ft  Mobility Ambulated with assistance in room;Out of bed to chair with meals  Mobility Response Tolerated well  Mobility performed by Mobility specialist  Bed Position Chair  $Mobility charge 1 Mobility   Pre Mobility: HR 75 bpm Post Mobility: HR 74 bpm  Pt received in bed, agreed to mobility. Required modA to stand from EOB. Ambulated to/from BR with RW and steadying assist. Pt required MaxA for pericare. Left in chair with all needs met.   Nelta Numbers Mobility Specialist  Phone [phone]

## 2021-09-21 NOTE — H&P (View-Only) (Signed)
Consultation  Referring Provider: Dr. Antonieta Pert    Primary Care Physician:  [name], MD Primary Gastroenterologist: Althia Forts       Reason for Consultation: Anemia            HPI:   Matthew Mahoney is a 85 y.o. male with a past medical history of remote stents, CHF, PVD, A. fib on Eliquis, CVA 2014, aortic stenosis, prostate cancer and multiple others listed below, who we are asked to see for anemia.    Patient initially admitted 09/17/2021 for weakness.  Patient found to have symptomatic bradycardia and wound infection on his lower extremity, also noted to have chronic alcohol dependence as well as A. fib on Eliquis and COPD with ongoing tobacco dependence.  We were consulted in regards to her worsening acute blood loss anemia with a drop in hemoglobin from 12.4 in 2016 to 9.4 at time of admission on 10/12.    Today, the patient explains that he did have trouble when swallowing a pill just this morning but "I was leaned back in bed and it was a rather large pill".  Denies any trouble with dysphagia prior to that moment.  Does relay a small amount of epigastric discomfort and 2 episodes of what sounds like diarrhea/fecal incontinence in the bed overnight.  Denies seeing any bright red blood or black tarry stool.    Denies fever, chills, weight loss, change in bowel habits, heartburn, reflux, nausea or vomiting.  GI history: 03/16/2013 EGD in the hospital with Dr. Cristina Gong with active bleeding from the proximal duodenum with complete hemostasis following epinephrine therapy and at least 2 duodenal ulcers  Past Medical History:  Diagnosis Date  . Alcohol abuse 01/21/2013  . Aortic stenosis   . Ascites    HISTORY  . Atrial fibrillation ([place]) 09/23/2013  . Choledocholithiasis   . Chronic combined systolic and diastolic congestive heart failure ([place]) 05/06/2015  . Conduction disorder of the heart 09/25/2013  . COPD (chronic obstructive pulmonary disease) ([place])   . Coronary  arteriosclerosis   . CVA (cerebral infarction) 09/19/2013  . GERD (gastroesophageal reflux disease) 10/11/2013  . GI bleed    a. 03/2013 a/w hemorrhagic shock - EGD on 03/1013, which revealed active bleeding from proximal duodenum, with complete hemostasis following epinephrine injection therapy. At least 2 duodenal ulcers identified.  . Hemorrhagic shock ([place]) 03/16/2013  . History of colonic diverticulitis   . Hyperkalemia   . Hyperlipidemia   . Hypertension   . Hypocalcemia   . Ichthyosis   . Lymphadenopathy   . Mitral regurgitation   . Nonsustained ventricular tachycardia 05/09/2015  . Olecranon bursitis   . Onychomycosis   . Prostate cancer ([place])   . Status post lung surgery 12/07/2001   BENIGHN LESION ON RIGHT LUNG REMOVED  . Thrombocytopenia ([place]) 09/25/2013  . UNSPECIFIED PERIPHERAL VASCULAR DISEASE 02/18/2011   Qualifier: Diagnosis of  By: Terald Sleeper    . Vitamin D deficiency     Past Surgical History:  Procedure Laterality Date  . COLONOSCOPY    . ESOPHAGOGASTRODUODENOSCOPY N/A 03/16/2013   Procedure: ESOPHAGOGASTRODUODENOSCOPY (EGD);  Surgeon: Cleotis Nipper, MD;  Location[facility] ENDOSCOPY;  Service: Endoscopy;  Laterality: N/A;  will probably want to do at bedside in ICU---bed placement pending  . FEMUR IM NAIL Left 01/21/2013   Procedure: INTRAMEDULLARY (IM) NAIL FEMORAL;  Surgeon: Hessie Dibble, MD;  Location: [facility] ORS;  Service: Orthopedics;  Laterality: Left;  . HEMORRHOID SURGERY  12/07/1998  .  HIP PINNING,CANNULATED Right 05/07/2015   Procedure: CANNULATED HIP PINNING;  Surgeon: Phylliss Bob, MD;  Location: [facility] ORS;  Service: Orthopedics;  Laterality: Right;  . LOOP RECORDER IMPLANT N/A 09/22/2013   Procedure: LOOP RECORDER IMPLANT;  Surgeon: Coralyn Mark, MD;  Location: [facility] CATH LAB;  Service: Cardiovascular;  Laterality: N/A;  . LUNG SURGERY  12/07/2001   BENIGN LESION ON RIGHT LUNG REMOVED    Family History  Problem Relation Age of Onset  . Hypertension  Mother 68  . Hypertension Father 75  . Hypertension Sister   . Hypertension Brother   . Hypertension Sister   . Hypertension Brother   . Hypertension Brother   . Hypertension Brother   . Hypertension Brother     Social History   Tobacco Use  . Smoking status: Every Day    Packs/day: 1.50    Years: 65.00    Pack years: 97.50    Types: Cigarettes  . Smokeless tobacco: Never  Substance Use Topics  . Alcohol use: Yes    Comment: 1 1/2 fifths a week, 4 shots/day  . Drug use: No    Prior to Admission medications   Medication Sig Start Date End Date Taking? Authorizing Provider  albuterol (PROVENTIL HFA;VENTOLIN HFA) 108 (90 BASE) MCG/ACT inhaler Inhale 1 puff into the lungs every 6 (six) hours as needed for wheezing or shortness of breath.   Yes [provider]  alendronate (FOSAMAX) 70 MG tablet Take 1 tablet by mouth once a week. 08/23/18  Yes [provider]  amLODipine (NORVASC) 2.5 MG tablet Take 2.5 mg by mouth daily.   Yes [provider]  apixaban (ELIQUIS) 2.5 MG TABS tablet Take 1 tablet (2.5 mg total) by mouth 2 (two) times daily. 09/25/13  Yes Velvet Bathe, MD  atorvastatin (LIPITOR) 40 MG tablet Take 40 mg by mouth every evening.   Yes [provider]  carvedilol (COREG) 12.5 MG tablet Take 12.5 mg by mouth 2 (two) times daily with a meal.   Yes [provider]  Cholecalciferol 25 MCG (1000 UT) tablet Take 1 tablet by mouth daily.   Yes [provider]  docusate sodium (COLACE) 100 MG capsule Take 200 mg by mouth daily as needed for mild constipation.   Yes [provider]  Fluticasone-Umeclidin-Vilant 100-62.5-25 MCG/INH AEPB Inhale 1 puff into the lungs daily.   Yes [provider]  Multiple Vitamins-Minerals (PRESERVISION AREDS PO) Take 1 tablet by mouth 2 (two) times daily.   Yes [provider]  Naproxen Sod-diphenhydrAMINE (ALEVE PM) 220-25 MG TABS Take 1 tablet by mouth at bedtime.    Yes [provider]  omega-3 acid ethyl esters (LOVAZA) 1 G capsule Take 1 g by mouth 2 (two) times daily.   Yes [provider]  pantoprazole (PROTONIX) 40 MG tablet Take 40 mg by mouth daily.   Yes [provider]  sacubitril-valsartan (ENTRESTO) 49-51 MG Take 1 tablet by mouth 2 (two) times daily.   Yes [provider]  thiamine 100 MG tablet Take 100 mg by mouth daily.   Yes [provider]  Travoprost, BAK Free, (TRAVATAN) 0.004 % SOLN ophthalmic solution Place 1 drop into both eyes at bedtime.   Yes [provider]    Current Facility-Administered Medications  Medication Dose Route Frequency Provider Last Rate Last Admin  . acetaminophen (TYLENOL) tablet 650 mg  650 mg Oral Q6H PRN Karmen Bongo, MD       Or  . acetaminophen (TYLENOL)  suppository 650 mg  650 mg Rectal Q6H PRN Karmen Bongo, MD      . albuterol (PROVENTIL) (2.5 MG/3ML) 0.083% nebulizer solution 2.5 mg  2.5 mg Inhalation Q6H PRN Karmen Bongo, MD      . amLODipine (NORVASC) tablet 2.5 mg  2.5 mg Oral Daily Karmen Bongo, MD   2.5 mg at 09/21/21 0835  . amoxicillin-clavulanate (AUGMENTIN) 875-125 MG per tablet 1 tablet  1 tablet Oral Q12H Irene Pap N, DO   1 tablet at 09/21/21 0834  . atorvastatin (LIPITOR) tablet 40 mg  40 mg Oral QPM Karmen Bongo, MD   40 mg at 09/20/21 1607  . bisacodyl (DULCOLAX) EC tablet 5 mg  5 mg Oral Daily PRN Karmen Bongo, MD      . docusate sodium (COLACE) capsule 100 mg  100 mg Oral BID Karmen Bongo, MD   100 mg at 09/20/21 0934  . feeding supplement (ENSURE ENLIVE / ENSURE PLUS) liquid 237 mL  237 mL Oral BID BM Little Ishikawa, MD   237 mL at 09/20/21 1652  . fluticasone furoate-vilanterol (BREO ELLIPTA) 100-25 MCG/INH 1 puff  1 puff Inhalation Daily Karmen Bongo, MD   1 puff at 09/20/21 0904  . folic acid (FOLVITE) tablet 1 mg  1 mg Oral Daily Karmen Bongo, MD   1 mg at 09/21/21 2694  . hydrALAZINE  (APRESOLINE) injection 5 mg  5 mg Intravenous Q4H PRN Karmen Bongo, MD      . HYDROcodone-acetaminophen (NORCO/VICODIN) 5-325 MG per tablet 1-2 tablet  1-2 tablet Oral Q4H PRN Karmen Bongo, MD      . lactated ringers infusion   Intravenous Continuous Kayleen Memos, DO 30 mL/hr at 09/21/21 0357 New Bag at 09/21/21 0357  . latanoprost (XALATAN) 0.005 % ophthalmic solution 1 drop  1 drop Both Eyes QHS Karmen Bongo, MD   1 drop at 09/20/21 2255  . multivitamin with minerals tablet 1 tablet  1 tablet Oral Daily Karmen Bongo, MD   1 tablet at 09/21/21 0834  . nicotine (NICODERM CQ - dosed in mg/24 hours) patch 14 mg  14 mg Transdermal Daily Karmen Bongo, MD   14 mg at 09/21/21 0837  . nutrition supplement (JUVEN) (JUVEN) powder packet 1 packet  1 packet Oral BID BM Little Ishikawa, MD   1 packet at 09/20/21 1257  . ondansetron (ZOFRAN) tablet 4 mg  4 mg Oral Q6H PRN Karmen Bongo, MD       Or  . ondansetron [facility]) injection 4 mg  4 mg Intravenous Q6H PRN Karmen Bongo, MD      . pantoprazole (PROTONIX) injection 40 mg  40 mg Intravenous Q12H Hall, Carole N, DO   40 mg at 09/21/21 0835  . polyethylene glycol (MIRALAX / GLYCOLAX) packet 17 g  17 g Oral Daily PRN Karmen Bongo, MD      . sacubitril-valsartan (ENTRESTO) 49-51 mg per tablet  1 tablet Oral BID Karmen Bongo, MD   1 tablet at 09/21/21 0834  . sodium chloride flush (NS) 0.9 % injection 3 mL  3 mL Intravenous Q12H Karmen Bongo, MD   3 mL at 09/20/21 0935  . thiamine tablet 100 mg  100 mg Oral Daily Karmen Bongo, MD   100 mg at 09/21/21 0835  . umeclidinium bromide (INCRUSE ELLIPTA) 62.5 MCG/INH 1 puff  1 puff Inhalation Daily Karmen Bongo, MD   1 puff at 09/20/21 0904    Allergies as of 09/17/2021  . (No Active Allergies)  Review of Systems:    Constitutional: No weight loss, fever or chills Skin: No rash  Cardiovascular: No chest pain Respiratory: No SOB  Gastrointestinal: See HPI and  otherwise negative Genitourinary: No dysuria  Neurological: No headache, dizziness or syncope Musculoskeletal: No new muscle or joint pain Hematologic: No bleeding or bruising Psychiatric: No history of depression or anxiety    Physical Exam:  Vital signs in last 24 hours: Temp:  [96.2 F (35.7 C)-98 F (36.7 C)] 97.4 F (36.3 C) (10/16 0746) Pulse Rate:  [56-76] 60 (10/16 0746) Resp:  [18-20] 20 (10/16 0746) BP: (115-136)/(66-88) 136/72 (10/16 0746) SpO2:  [96 %-100 %] 99 % (10/16 0746) Last BM Date: 09/20/21 General: Chronically ill-appearing Caucasian male appears to be in NAD, Well developed, Well nourished, alert and cooperative Head:  Normocephalic and atraumatic. Eyes:   PEERL, EOMI. No icterus. Conjunctiva pink. Ears:  Normal auditory acuity. Neck:  Supple Throat: Oral cavity and pharynx without inflammation, swelling or lesion.  Lungs: Respirations even and unlabored. Lungs clear to auscultation bilaterally.   No wheezes, crackles, or rhonchi.  Heart: Normal S1, S2. No MRG. Regular rate and rhythm.  Trace lower extremity edema Abdomen:  Soft, nondistended, nontender. No rebound or guarding. Normal bowel sounds. No appreciable masses or hepatomegaly. Rectal:  Not performed.  Msk:  Symmetrical without gross deformities. Peripheral pulses intact.  Extremities:  Without edema, no deformity or joint abnormality.+ Bruising over extremities, clean bandage on his right lower leg Neurologic:  Alert and  oriented x4;  grossly normal neurologically.  Skin:   Dry and intact without significant lesions or rashes. Psychiatric: Demonstrates good judgement and reason without abnormal affect or behaviors.   LAB RESULTS: Recent Labs    09/19/21 0536 09/20/21 0131 09/20/21 1609 09/20/21 2204 09/21/21 0457  WBC 7.6 6.1  --   --  7.3  HGB 9.2* 8.2* 8.7* 9.8* 9.2*  HCT 27.6* 25.0* 26.8* 30.4* 28.4*  PLT 133* 117*  --   --  132*   BMET Recent Labs    09/19/21 0536 09/20/21 0624   NA 136 137  K 4.0 4.1  CL 108 106  CO2 19* 21*  GLUCOSE 107* 102*  BUN 41* 38*  CREATININE 1.60* 1.41*  CALCIUM 8.0* 8.4*     Impression / Plan:   Impression: 1.  Anemia: In fairness we are comparing a hemoglobin in the normal range in 2016 to a drop to 9.4 on this admission, really hemoglobin has remained stable throughout this admission over the past 4 days, patient has not had evaluation with EGD since 2014 during an acute GI bleed and there is some question about whether he has ever had colonoscopy, also Hemoccult negative this admission, medical team is wanting to start him back on his blood thinner and want to ensure no GI source 2.  History of duodenal ulcers: In 2014, patient has not seen any bleeding since then 3.  Improving AKI 4.  Right lower extremity cellulitis: Currently on Augmentin 5.  A. fib with slow ventricular response: Typically on Eliquis with his last dose on the morning of 10/15 6.  Alcohol use disorder  Plan: 1.  Given Eliquis dosing on 10/15 a.m. we will wait for procedure until tomorrow.  Will plan on EGD with Dr. Lyndel Safe given history of duodenal ulcers.  Did discuss risks, benefits, limitations and alternatives and patient agrees to proceed. 2.  Patient can be on regular diet today and n.p.o. at midnight 3.  Continue to monitor hemoglobin  and transfuse as needed less than 7 4.  Continue antibiotics for cellulitis 5.  Continue Pantoprazole 40 mg IV twice daily  Thank you for your kind consultation, we will continue to follow.  Lavone Nian Amnah Breuer  09/21/2021, 10:30 AM

## 2021-09-21 NOTE — Progress Notes (Signed)
PROGRESS NOTE    DEMITRIOUS MCCANNON  OVF:[id number] DOB: 26-Sep-1936 DOA: 09/17/2021 PCP: [name], MD   Chief Complaint  Patient presents with   Bradycardia    Brief Narrative/Hospital Course:  [name], 85 y.o. male with PMH of alcohol abuse, A. fib on Eliquis, chronic combined CHF, COPD, CVA, HTN, HLD, prostate cancer, PVD presented with weakness, and recent fall about 2 weeks PTA and has been having bruising on the right leg and not healing and was having pain and trouble walking.  Patient was seen in the ED and admitted for further management. Patient had bradycardia seen by cardiology no further plans are planned for PPM.  Patient is being treated for lower extremity cellulitis.  Subjective:  Seen and examined this morning.  Nursing reports patient was coughing/choking on medication earlier this morning Patient had no complaint.  Was intermittently coughing  Assessment & Plan:  RLE cellulitis ACZ:[id number] on Augmentin from 10/15- stop date 10/20.Continue wound care dressing changes.  Overall clinically improving, he is afebrile and no leukocytosis..  Anemia unclear etiology Anemia of chronic disease, rule out acute blood loss History of duodenal ulcers: History of previous duodenal ulcer from EGD done in 2014.Eliquis discontinued by cardiology given the anemia and fall and they are planning for outpatient follow-up in 2 to 4 weeks.anemia panel shows normal iron ferritin. GI has been consulted-they are planning for EGD tomorrow for further evaluation.Continue on PPI twice daily. Recent Labs  Lab 09/20/21 0131 09/20/21 1609 09/20/21 2204 09/21/21 0457 09/21/21 0942  HGB 8.2* 8.7* 9.8* 9.2* 9.9*  HCT 25.0* 26.8* 30.4* 28.4* 30.1*   AKI likely prerenal in the setting of dehydration: Peaked 1.9 now improving, encourage oral hydration avoid nephrotoxic medication. Net IO Since Admission: 1,536.09 mL [09/21/21 1425]  Recent Labs  Lab 09/17/21 1149  09/18/21 0222 09/19/21 0536 09/20/21 0624  BUN 56* 39* 41* 38*  CREATININE 1.99* 1.18 1.60* 1.41*   [place] metabolic acidosis: Likely with AKI.  Monitor  A. fib with slow ventricular response  Right bundle branch block/left anterior fascicular block : Patient had slow ventricular rate/bradycardia bradycardia: Seen by cardiology no plan/no recommendation for PPM, advised to keep off AV node blocking agents.  Eliquis discontinued for now by cardio 10/15 Chronic combined systolic and diastolic congestive heart failure : Not in failure, euvolemic.  Continues Entresto, Lipitor amlodipine. Peripheral vascular disease: Continue Lipitor Hypertension: BP stable on low-dose amlodipine.  Weakness/fatigue/generalized deconditioning/ multiple falls: Continue PT OT, will need a skilled nursing facility upon discharge.  Alcohol use disorder no evidence of withdrawal continue to monitor.  Continue to  COPD: Not in exacerbation, continue Incruse Ellipta and Breo.  Coughing/choking with pills: No previous history of dysphagia reports x 1 episode-If recurs will get a speech eval.He is going for EGD in am.  Tobacco abuse: Continue nicotine patch  Severe malnutrition augment diet as below  Nutrition Problem: Severe Malnutrition Etiology: chronic illness (CHF, COPD) Signs/Symptoms: severe fat depletion, severe muscle depletion Interventions: Ensure Enlive (each supplement provides 350kcal and 20 grams of protein), Juven, MVI, Liberalize Diet Upper lower buttock pressure injury stage II, POA-see below  DVT prophylaxis: Place and maintain sequential compression device Start: 09/21/21 0858NO chemica prophylaxis 2/2 anemia.  Code Status:   Code Status: Full Code Family Communication: plan of care discussed with patient at bedside. Status is: Inpatient  Remains inpatient appropriate because: Ongoing management of anemia, debility and cellulitis treatment and will need placement. Anticipate discharge: to  skilled nursing facility in 2  days.    Objective: Vitals: Today's Vitals   09/21/21 0746 09/21/21 0835 09/21/21 1130 09/21/21 1146  BP: 136/72   133/88  Pulse: 60  75 79  Resp: 20   20  Temp: (!) 97.4 F (36.3 C)   97.6 F (36.4 C)  TempSrc: Oral   Oral  SpO2: 99%   96%  Weight:      Height:      PainSc:  0-No pain     Physical Examination: General exam: AA0 oriented ill and frail looking, weak,older than stated age. HEENT:Oral mucosa moist, Ear/Nose WNL grossly,dentition normal. Respiratory system: B/l diminished BS, no use of accessory muscle, non tender. Cardiovascular system: S1 & S2 +,No JVD. Gastrointestinal system: Abdomen soft, NT,ND, BS+. Nervous System:Alert, awake, moving extremities. Extremities: edema none, distal peripheral pulses palpable.  Skin: No rashes, no icterus. MSK: Normal muscle bulk, tone, power.  Medications reviewed:  Scheduled Meds:  amLODipine  2.5 mg Oral Daily   amoxicillin-clavulanate  1 tablet Oral Q12H   atorvastatin  40 mg Oral QPM   docusate sodium  100 mg Oral BID   feeding supplement  237 mL Oral BID BM   fluticasone furoate-vilanterol  1 puff Inhalation Daily   folic acid  1 mg Oral Daily   latanoprost  1 drop Both Eyes QHS   multivitamin with minerals  1 tablet Oral Daily   nicotine  14 mg Transdermal Daily   nutrition supplement (JUVEN)  1 packet Oral BID BM   pantoprazole (PROTONIX) IV  40 mg Intravenous Q12H   sacubitril-valsartan  1 tablet Oral BID   sodium chloride flush  3 mL Intravenous Q12H   thiamine  100 mg Oral Daily   umeclidinium bromide  1 puff Inhalation Daily   Continuous Infusions:  lactated ringers 30 mL/hr at 09/21/21 0357   Pressure Injury 09/17/21 Buttocks Medial;Upper;Lower Stage 2 -  Partial thickness loss of dermis presenting as a shallow open injury with a red, pink wound bed without slough. redness (Active)  09/17/21 1859  Location: [place]  Location Orientation: Medial;Upper;Lower  Staging:  Stage 2 -  Partial thickness loss of dermis presenting as a shallow open injury with a red, pink wound bed without slough.  Wound Description (Comments): redness  Present on Admission: Yes    Diet Order             Diet NPO time specified  Diet effective midnight           Diet regular Room service appropriate? Yes; Fluid consistency: Thin  Diet effective now                   Nutrition Problem: Severe Malnutrition Etiology: chronic illness (CHF, COPD) Signs/Symptoms: severe fat depletion, severe muscle depletion Interventions: Ensure Enlive (each supplement provides 350kcal and 20 grams of protein), Juven, MVI, Liberalize Diet  Intake/Output  Intake/Output Summary (Last 24 hours) at 09/21/2021 1425 Last data filed at 09/21/2021 0900 Gross per 24 hour  Intake 884.5 ml  Output 625 ml  Net 259.5 ml   Intake/Output from previous day: 10/15 0701 - 10/16 0700 In: 1004.5 [P.O.:720; I.V.:284.5] Out: 400 [Urine:400] Net IO Since Admission: 1,536.09 mL [09/21/21 1425]   Weight change:   Wt Readings from Last 3 Encounters:  09/17/21 80 kg  11/21/18 88.5 kg  02/26/17 92.6 kg     Consultants:see note  Procedures:see note Antimicrobials: Anti-infectives (From admission, onward)    Start     Dose/Rate Route Frequency Ordered Stop  09/20/21 1630  amoxicillin-clavulanate (AUGMENTIN) 875-125 MG per tablet 1 tablet        1 tablet Oral Every 12 hours 09/20/21 1542 09/25/21 2159   09/19/21 1930  cefTRIAXone (ROCEPHIN) 2 g in sodium chloride 0.9 % 100 mL IVPB  Status:  Discontinued       See Hyperspace for full Linked Orders Report.   2 g 200 mL/hr over 30 Minutes Intravenous Every 24 hours 09/19/21 1112 09/20/21 1542   09/19/21 1215  metroNIDAZOLE (FLAGYL) tablet 500 mg  Status:  Discontinued        500 mg Oral Every 12 hours 09/19/21 1116 09/20/21 1542   09/19/21 1200  metroNIDAZOLE (FLAGYL) IVPB 500 mg  Status:  Discontinued       See Hyperspace for full Linked Orders  Report.   500 mg 100 mL/hr over 60 Minutes Intravenous Every 12 hours 09/19/21 1112 09/19/21 1116   09/17/21 1930  cefTRIAXone (ROCEPHIN) 2 g in sodium chloride 0.9 % 100 mL IVPB  Status:  Discontinued       See Hyperspace for full Linked Orders Report.   2 g 200 mL/hr over 30 Minutes Intravenous Every 24 hours 09/17/21 1838 09/19/21 1112   09/17/21 1930  metroNIDAZOLE (FLAGYL) IVPB 500 mg  Status:  Discontinued       See Hyperspace for full Linked Orders Report.   500 mg 100 mL/hr over 60 Minutes Intravenous Every 12 hours 09/17/21 1838 09/19/21 1112      Culture/Microbiology    Component Value Date/Time   SDES URINE, CATHETERIZED 03/16/2013 1129   SPECREQUEST NONE 03/16/2013 1129   CULT NO GROWTH 03/16/2013 1129   REPTSTATUS 03/17/2013 FINAL 03/16/2013 1129    Other culture-see note  Unresulted Labs (From admission, onward)     Start     Ordered   09/22/21 7858  Basic metabolic panel  Daily,   R     Question:  Specimen collection method  Answer:  Lab=Lab collect   09/21/21 1421   09/20/21 1559  Occult blood card to lab, stool  Once,   R        09/20/21 1559   09/19/21 0500  CBC  Every 48 hours,   R     Question:  Specimen collection method  Answer:  Lab=Lab collect   09/18/21 1734            Data Reviewed: I have personally reviewed following labs and imaging studies CBC: Recent Labs  Lab 09/17/21 1149 09/18/21 0222 09/18/21 0432 09/19/21 0536 09/20/21 0131 09/20/21 1609 09/20/21 2204 09/21/21 0457 09/21/21 0942  WBC 7.4 QUESTIONABLE RESULTS, RECOMMEND RECOLLECT TO VERIFY 7.8 7.6 6.1  --   --  7.3  --   NEUTROABS 5.0  --   --   --   --   --   --   --   --   HGB 9.4* QUESTIONABLE RESULTS, RECOMMEND RECOLLECT TO VERIFY 8.7* 9.2* 8.2* 8.7* 9.8* 9.2* 9.9*  HCT 29.3* QUESTIONABLE RESULTS, RECOMMEND RECOLLECT TO VERIFY 26.1* 27.6* 25.0* 26.8* 30.4* 28.4* 30.1*  MCV 97.7 QUESTIONABLE RESULTS, RECOMMEND RECOLLECT TO VERIFY 93.9 94.8 94.7  --   --  95.3  --   PLT  121* QUESTIONABLE RESULTS, RECOMMEND RECOLLECT TO VERIFY 112* 133* 117*  --   --  132*  --    Basic Metabolic Panel: Recent Labs  Lab 09/17/21 1149 09/18/21 0222 09/19/21 0536 09/20/21 0624  NA 136 133* 136 137  K 5.5* 4.4 4.0 4.1  CL  107 108 108 106  CO2 19* 14* 19* 21*  GLUCOSE 101* 83 107* 102*  BUN 56* 39* 41* 38*  CREATININE 1.99* 1.18 1.60* 1.41*  CALCIUM 8.3* 7.3* 8.0* 8.4*   GFR: Estimated Creatinine Clearance: 43.6 mL/min (A) (by C-G formula based on SCr of 1.41 mg/dL (H)). Liver Function Tests: Recent Labs  Lab 09/17/21 1149  AST 13*  ALT 8  ALKPHOS 67  BILITOT 1.0  PROT 5.5*  ALBUMIN 2.8*   No results for input(s): LIPASE, AMYLASE in the last 168 hours. No results for input(s): AMMONIA in the last 168 hours. Coagulation Profile: No results for input(s): INR, PROTIME in the last 168 hours. Cardiac Enzymes: No results for input(s): CKTOTAL, CKMB, CKMBINDEX, TROPONINI in the last 168 hours. BNP (last 3 results) No results for input(s): PROBNP in the last 8760 hours. HbA1C: No results for input(s): HGBA1C in the last 72 hours. CBG: No results for input(s): GLUCAP in the last 168 hours. Lipid Profile: No results for input(s): CHOL, HDL, LDLCALC, TRIG, CHOLHDL, LDLDIRECT in the last 72 hours. Thyroid Function Tests: No results for input(s): TSH, T4TOTAL, FREET4, T3FREE, THYROIDAB in the last 72 hours. Anemia Panel: Recent Labs    09/20/21 0643 09/20/21 1609  FERRITIN  --  288  TIBC  --  158*  IRON  --  47  RETICCTPCT 1.5  --    Sepsis Labs: No results for input(s): PROCALCITON, LATICACIDVEN in the last 168 hours.  Recent Results (from the past 240 hour(s))  Resp Panel by RT-PCR (Flu A&B, Covid) Nasopharyngeal Swab     Status: None   Collection Time: 09/17/21 11:50 AM   Specimen: Nasopharyngeal Swab; Nasopharyngeal(NP) swabs in vial transport medium  Result Value Ref Range Status   SARS Coronavirus 2 by RT PCR NEGATIVE NEGATIVE Final    Comment:  (NOTE) SARS-CoV-2 target nucleic acids are NOT DETECTED.  The SARS-CoV-2 RNA is generally detectable in upper respiratory specimens during the acute phase of infection. The lowest concentration of SARS-CoV-2 viral copies this assay can detect is 138 copies/mL. A negative result does not preclude SARS-Cov-2 infection and should not be used as the sole basis for treatment or other patient management decisions. A negative result may occur with  improper specimen collection/handling, submission of specimen other than nasopharyngeal swab, presence of viral mutation(s) within the areas targeted by this assay, and inadequate number of viral copies(<138 copies/mL). A negative result must be combined with clinical observations, patient history, and epidemiological information. The expected result is Negative.  Fact Sheet for Patients:  EntrepreneurPulse.com.au  Fact Sheet for Healthcare Providers:  IncredibleEmployment.be  This test is no t yet approved or cleared by the [place] FDA and  has been authorized for detection and/or diagnosis of SARS-CoV-2 by FDA under an Emergency Use Authorization (EUA). This EUA will remain  in effect (meaning this test can be used) for the duration of the COVID-19 declaration under Section 564(b)(1) of the Act, 21 U.S.C.section 360bbb-3(b)(1), unless the authorization is terminated  or revoked sooner.       Influenza A by PCR NEGATIVE NEGATIVE Final   Influenza B by PCR NEGATIVE NEGATIVE Final    Comment: (NOTE) The Xpert Xpress SARS-CoV-2/FLU/RSV plus assay is intended as an aid in the diagnosis of influenza from Nasopharyngeal swab specimens and should not be used as a sole basis for treatment. Nasal washings and aspirates are unacceptable for Xpert Xpress SARS-CoV-2/FLU/RSV testing.  Fact Sheet for Patients: EntrepreneurPulse.com.au  Fact Sheet for Healthcare  Providers: IncredibleEmployment.be  This test is not yet approved or cleared by the [place] and has been authorized for detection and/or diagnosis of SARS-CoV-2 by FDA under an Emergency Use Authorization (EUA). This EUA will remain in effect (meaning this test can be used) for the duration of the COVID-19 declaration under Section 564(b)(1) of the Act, 21 U.S.C. section 360bbb-3(b)(1), unless the authorization is terminated or revoked.  Performed at [facility], [place] [address] [place], [place] [address]      Radiology Studies: No results found.   LOS: 4 days   Antonieta Pert, MD [facility]  09/21/2021, 2:25 PM

## 2021-09-21 NOTE — Consult Note (Signed)
Consultation  Referring Provider: Dr. Antonieta Pert    Primary Care Physician:  [name], MD Primary Gastroenterologist: Althia Forts       Reason for Consultation: Anemia            HPI:   Matthew Mahoney is a 85 y.o. male with a past medical history of remote stents, CHF, PVD, A. fib on Eliquis, CVA 2014, aortic stenosis, prostate cancer and multiple others listed below, who we are asked to see for anemia.    Patient initially admitted 09/17/2021 for weakness.  Patient found to have symptomatic bradycardia and wound infection on his lower extremity, also noted to have chronic alcohol dependence as well as A. fib on Eliquis and COPD with ongoing tobacco dependence.  We were consulted in regards to her worsening acute blood loss anemia with a drop in hemoglobin from 12.4 in 2016 to 9.4 at time of admission on 10/12.    Today, the patient explains that he did have trouble when swallowing a pill just this morning but "I was leaned back in bed and it was a rather large pill".  Denies any trouble with dysphagia prior to that moment.  Does relay a small amount of epigastric discomfort and 2 episodes of what sounds like diarrhea/fecal incontinence in the bed overnight.  Denies seeing any bright red blood or black tarry stool.    Denies fever, chills, weight loss, change in bowel habits, heartburn, reflux, nausea or vomiting.  GI history: 03/16/2013 EGD in the hospital with Dr. Cristina Gong with active bleeding from the proximal duodenum with complete hemostasis following epinephrine therapy and at least 2 duodenal ulcers  Past Medical History:  Diagnosis Date  . Alcohol abuse 01/21/2013  . Aortic stenosis   . Ascites    HISTORY  . Atrial fibrillation ([place]) 09/23/2013  . Choledocholithiasis   . Chronic combined systolic and diastolic congestive heart failure ([place]) 05/06/2015  . Conduction disorder of the heart 09/25/2013  . COPD (chronic obstructive pulmonary disease) ([place])   . Coronary  arteriosclerosis   . CVA (cerebral infarction) 09/19/2013  . GERD (gastroesophageal reflux disease) 10/11/2013  . GI bleed    a. 03/2013 a/w hemorrhagic shock - EGD on 03/1013, which revealed active bleeding from proximal duodenum, with complete hemostasis following epinephrine injection therapy. At least 2 duodenal ulcers identified.  . Hemorrhagic shock ([place]) 03/16/2013  . History of colonic diverticulitis   . Hyperkalemia   . Hyperlipidemia   . Hypertension   . Hypocalcemia   . Ichthyosis   . Lymphadenopathy   . Mitral regurgitation   . Nonsustained ventricular tachycardia 05/09/2015  . Olecranon bursitis   . Onychomycosis   . Prostate cancer ([place])   . Status post lung surgery 12/07/2001   BENIGHN LESION ON RIGHT LUNG REMOVED  . Thrombocytopenia ([place]) 09/25/2013  . UNSPECIFIED PERIPHERAL VASCULAR DISEASE 02/18/2011   Qualifier: Diagnosis of  By: Terald Sleeper    . Vitamin D deficiency     Past Surgical History:  Procedure Laterality Date  . COLONOSCOPY    . ESOPHAGOGASTRODUODENOSCOPY N/A 03/16/2013   Procedure: ESOPHAGOGASTRODUODENOSCOPY (EGD);  Surgeon: Cleotis Nipper, MD;  Location: [facility] ENDOSCOPY;  Service: Endoscopy;  Laterality: N/A;  will probably want to do at bedside in ICU---bed placement pending  . FEMUR IM NAIL Left 01/21/2013   Procedure: INTRAMEDULLARY (IM) NAIL FEMORAL;  Surgeon: Hessie Dibble, MD;  Location: [facility] ORS;  Service: Orthopedics;  Laterality: Left;  . HEMORRHOID SURGERY  12/07/1998  .  HIP PINNING,CANNULATED Right 05/07/2015   Procedure: CANNULATED HIP PINNING;  Surgeon: Phylliss Bob, MD;  Location: [facility] ORS;  Service: Orthopedics;  Laterality: Right;  . LOOP RECORDER IMPLANT N/A 09/22/2013   Procedure: LOOP RECORDER IMPLANT;  Surgeon: Coralyn Mark, MD;  Location: [facility] CATH LAB;  Service: Cardiovascular;  Laterality: N/A;  . LUNG SURGERY  12/07/2001   BENIGN LESION ON RIGHT LUNG REMOVED    Family History  Problem Relation Age of Onset  . Hypertension  Mother 4  . Hypertension Father 59  . Hypertension Sister   . Hypertension Brother   . Hypertension Sister   . Hypertension Brother   . Hypertension Brother   . Hypertension Brother   . Hypertension Brother     Social History   Tobacco Use  . Smoking status: Every Day    Packs/day: 1.50    Years: 65.00    Pack years: 97.50    Types: Cigarettes  . Smokeless tobacco: Never  Substance Use Topics  . Alcohol use: Yes    Comment: 1 1/2 fifths a week, 4 shots/day  . Drug use: No    Prior to Admission medications   Medication Sig Start Date End Date Taking? Authorizing Provider  albuterol (PROVENTIL HFA;VENTOLIN HFA) 108 (90 BASE) MCG/ACT inhaler Inhale 1 puff into the lungs every 6 (six) hours as needed for wheezing or shortness of breath.   Yes [provider]  alendronate (FOSAMAX) 70 MG tablet Take 1 tablet by mouth once a week. 08/23/18  Yes [provider]  amLODipine (NORVASC) 2.5 MG tablet Take 2.5 mg by mouth daily.   Yes [provider]  apixaban (ELIQUIS) 2.5 MG TABS tablet Take 1 tablet (2.5 mg total) by mouth 2 (two) times daily. 09/25/13  Yes Velvet Bathe, MD  atorvastatin (LIPITOR) 40 MG tablet Take 40 mg by mouth every evening.   Yes [provider]  carvedilol (COREG) 12.5 MG tablet Take 12.5 mg by mouth 2 (two) times daily with a meal.   Yes [provider]  Cholecalciferol 25 MCG (1000 UT) tablet Take 1 tablet by mouth daily.   Yes [provider]  docusate sodium (COLACE) 100 MG capsule Take 200 mg by mouth daily as needed for mild constipation.   Yes [provider]  Fluticasone-Umeclidin-Vilant 100-62.5-25 MCG/INH AEPB Inhale 1 puff into the lungs daily.   Yes [provider]  Multiple Vitamins-Minerals (PRESERVISION AREDS PO) Take 1 tablet by mouth 2 (two) times daily.   Yes [provider]  Naproxen Sod-diphenhydrAMINE (ALEVE PM) 220-25 MG TABS Take 1 tablet by mouth at bedtime.    Yes [provider]  omega-3 acid ethyl esters (LOVAZA) 1 G capsule Take 1 g by mouth 2 (two) times daily.   Yes [provider]  pantoprazole (PROTONIX) 40 MG tablet Take 40 mg by mouth daily.   Yes [provider]  sacubitril-valsartan (ENTRESTO) 49-51 MG Take 1 tablet by mouth 2 (two) times daily.   Yes [provider]  thiamine 100 MG tablet Take 100 mg by mouth daily.   Yes [provider]  Travoprost, BAK Free, (TRAVATAN) 0.004 % SOLN ophthalmic solution Place 1 drop into both eyes at bedtime.   Yes [provider]    Current Facility-Administered Medications  Medication Dose Route Frequency Provider Last Rate Last Admin  . acetaminophen (TYLENOL) tablet 650 mg  650 mg Oral Q6H PRN Karmen Bongo, MD       Or  . acetaminophen (TYLENOL)  suppository 650 mg  650 mg Rectal Q6H PRN Karmen Bongo, MD      . albuterol (PROVENTIL) (2.5 MG/3ML) 0.083% nebulizer solution 2.5 mg  2.5 mg Inhalation Q6H PRN Karmen Bongo, MD      . amLODipine (NORVASC) tablet 2.5 mg  2.5 mg Oral Daily Karmen Bongo, MD   2.5 mg at 09/21/21 0835  . amoxicillin-clavulanate (AUGMENTIN) 875-125 MG per tablet 1 tablet  1 tablet Oral Q12H Irene Pap N, DO   1 tablet at 09/21/21 0834  . atorvastatin (LIPITOR) tablet 40 mg  40 mg Oral QPM Karmen Bongo, MD   40 mg at 09/20/21 1607  . bisacodyl (DULCOLAX) EC tablet 5 mg  5 mg Oral Daily PRN Karmen Bongo, MD      . docusate sodium (COLACE) capsule 100 mg  100 mg Oral BID Karmen Bongo, MD   100 mg at 09/20/21 0934  . feeding supplement (ENSURE ENLIVE / ENSURE PLUS) liquid 237 mL  237 mL Oral BID BM Little Ishikawa, MD   237 mL at 09/20/21 1652  . fluticasone furoate-vilanterol (BREO ELLIPTA) 100-25 MCG/INH 1 puff  1 puff Inhalation Daily Karmen Bongo, MD   1 puff at 09/20/21 0904  . folic acid (FOLVITE) tablet 1 mg  1 mg Oral Daily Karmen Bongo, MD   1 mg at 09/21/21 6720  . hydrALAZINE  (APRESOLINE) injection 5 mg  5 mg Intravenous Q4H PRN Karmen Bongo, MD      . HYDROcodone-acetaminophen (NORCO/VICODIN) 5-325 MG per tablet 1-2 tablet  1-2 tablet Oral Q4H PRN Karmen Bongo, MD      . lactated ringers infusion   Intravenous Continuous Kayleen Memos, DO 30 mL/hr at 09/21/21 0357 New Bag at 09/21/21 0357  . latanoprost (XALATAN) 0.005 % ophthalmic solution 1 drop  1 drop Both Eyes QHS Karmen Bongo, MD   1 drop at 09/20/21 2255  . multivitamin with minerals tablet 1 tablet  1 tablet Oral Daily Karmen Bongo, MD   1 tablet at 09/21/21 0834  . nicotine (NICODERM CQ - dosed in mg/24 hours) patch 14 mg  14 mg Transdermal Daily Karmen Bongo, MD   14 mg at 09/21/21 0837  . nutrition supplement (JUVEN) (JUVEN) powder packet 1 packet  1 packet Oral BID BM Little Ishikawa, MD   1 packet at 09/20/21 1257  . ondansetron (ZOFRAN) tablet 4 mg  4 mg Oral Q6H PRN Karmen Bongo, MD       Or  . ondansetron [facility]) injection 4 mg  4 mg Intravenous Q6H PRN Karmen Bongo, MD      . pantoprazole (PROTONIX) injection 40 mg  40 mg Intravenous Q12H Hall, Carole N, DO   40 mg at 09/21/21 0835  . polyethylene glycol (MIRALAX / GLYCOLAX) packet 17 g  17 g Oral Daily PRN Karmen Bongo, MD      . sacubitril-valsartan (ENTRESTO) 49-51 mg per tablet  1 tablet Oral BID Karmen Bongo, MD   1 tablet at 09/21/21 0834  . sodium chloride flush (NS) 0.9 % injection 3 mL  3 mL Intravenous Q12H Karmen Bongo, MD   3 mL at 09/20/21 0935  . thiamine tablet 100 mg  100 mg Oral Daily Karmen Bongo, MD   100 mg at 09/21/21 0835  . umeclidinium bromide (INCRUSE ELLIPTA) 62.5 MCG/INH 1 puff  1 puff Inhalation Daily Karmen Bongo, MD   1 puff at 09/20/21 0904    Allergies as of 09/17/2021  . (No Active Allergies)  Review of Systems:    Constitutional: No weight loss, fever or chills Skin: No rash  Cardiovascular: No chest pain Respiratory: No SOB  Gastrointestinal: See HPI and  otherwise negative Genitourinary: No dysuria  Neurological: No headache, dizziness or syncope Musculoskeletal: No new muscle or joint pain Hematologic: No bleeding or bruising Psychiatric: No history of depression or anxiety    Physical Exam:  Vital signs in last 24 hours: Temp:  [96.2 F (35.7 C)-98 F (36.7 C)] 97.4 F (36.3 C) (10/16 0746) Pulse Rate:  [56-76] 60 (10/16 0746) Resp:  [18-20] 20 (10/16 0746) BP: (115-136)/(66-88) 136/72 (10/16 0746) SpO2:  [96 %-100 %] 99 % (10/16 0746) Last BM Date: 09/20/21 General: Chronically ill-appearing Caucasian male appears to be in NAD, Well developed, Well nourished, alert and cooperative Head:  Normocephalic and atraumatic. Eyes:   PEERL, EOMI. No icterus. Conjunctiva pink. Ears:  Normal auditory acuity. Neck:  Supple Throat: Oral cavity and pharynx without inflammation, swelling or lesion.  Lungs: Respirations even and unlabored. Lungs clear to auscultation bilaterally.   No wheezes, crackles, or rhonchi.  Heart: Normal S1, S2. No MRG. Regular rate and rhythm.  Trace lower extremity edema Abdomen:  Soft, nondistended, nontender. No rebound or guarding. Normal bowel sounds. No appreciable masses or hepatomegaly. Rectal:  Not performed.  Msk:  Symmetrical without gross deformities. Peripheral pulses intact.  Extremities:  Without edema, no deformity or joint abnormality.+ Bruising over extremities, clean bandage on his right lower leg Neurologic:  Alert and  oriented x4;  grossly normal neurologically.  Skin:   Dry and intact without significant lesions or rashes. Psychiatric: Demonstrates good judgement and reason without abnormal affect or behaviors.   LAB RESULTS: Recent Labs    09/19/21 0536 09/20/21 0131 09/20/21 1609 09/20/21 2204 09/21/21 0457  WBC 7.6 6.1  --   --  7.3  HGB 9.2* 8.2* 8.7* 9.8* 9.2*  HCT 27.6* 25.0* 26.8* 30.4* 28.4*  PLT 133* 117*  --   --  132*   BMET Recent Labs    09/19/21 0536 09/20/21 0624   NA 136 137  K 4.0 4.1  CL 108 106  CO2 19* 21*  GLUCOSE 107* 102*  BUN 41* 38*  CREATININE 1.60* 1.41*  CALCIUM 8.0* 8.4*     Impression / Plan:   Impression: 1.  Anemia: In fairness we are comparing a hemoglobin in the normal range in 2016 to a drop to 9.4 on this admission, really hemoglobin has remained stable throughout this admission over the past 4 days, patient has not had evaluation with EGD since 2014 during an acute GI bleed and there is some question about whether he has ever had colonoscopy, also Hemoccult negative this admission, medical team is wanting to start him back on his blood thinner and want to ensure no GI source 2.  History of duodenal ulcers: In 2014, patient has not seen any bleeding since then 3.  Improving AKI 4.  Right lower extremity cellulitis: Currently on Augmentin 5.  A. fib with slow ventricular response: Typically on Eliquis with his last dose on the morning of 10/15 6.  Alcohol use disorder  Plan: 1.  Given Eliquis dosing on 10/15 a.m. we will wait for procedure until tomorrow.  Will plan on EGD with Dr. Lyndel Safe given history of duodenal ulcers.  Did discuss risks, benefits, limitations and alternatives and patient agrees to proceed. 2.  Patient can be on regular diet today and n.p.o. at midnight 3.  Continue to monitor hemoglobin  and transfuse as needed less than 7 4.  Continue antibiotics for cellulitis 5.  Continue Pantoprazole 40 mg IV twice daily  Thank you for your kind consultation, we will continue to follow.  Lavone Nian Dione Petron  09/21/2021, 10:30 AM

## 2021-09-21 NOTE — Progress Notes (Signed)
Informed consent completed for EGD on 09/21/21. Sister of patient Matthew Mahoney provided a telephone verbal consent with myself and another RN. Hard copy of informed consent plated in patients chart.  Daymon Larsen, RN

## 2021-09-22 ENCOUNTER — Encounter (HOSPITAL_COMMUNITY): Payer: Self-pay | Admitting: Internal Medicine

## 2021-09-22 ENCOUNTER — Encounter (HOSPITAL_COMMUNITY)
Admission: EM | Disposition: A | Discharge status: Skilled Nursing Facility | Payer: Self-pay | Source: Home / Self Care | Attending: Internal Medicine

## 2021-09-22 ENCOUNTER — Inpatient Hospital Stay (HOSPITAL_COMMUNITY): Payer: Medicare Other | Admitting: Certified Registered"

## 2021-09-22 DIAGNOSIS — R001 Bradycardia, unspecified: Secondary | ICD-10-CM | POA: Diagnosis not present

## 2021-09-22 DIAGNOSIS — K222 Esophageal obstruction: Secondary | ICD-10-CM

## 2021-09-22 DIAGNOSIS — K2970 Gastritis, unspecified, without bleeding: Secondary | ICD-10-CM

## 2021-09-22 HISTORY — PX: BIOPSY: SHX5522

## 2021-09-22 HISTORY — PX: ESOPHAGEAL DILATION: SHX303

## 2021-09-22 HISTORY — PX: ESOPHAGOGASTRODUODENOSCOPY (EGD) WITH PROPOFOL: SHX5813

## 2021-09-22 LAB — CBC
HCT: 28.7 % — ABNORMAL LOW (ref 39.0–52.0)
Hemoglobin: 9.3 g/dL — ABNORMAL LOW (ref 13.0–17.0)
MCH: 31 pg (ref 26.0–34.0)
MCHC: 32.4 g/dL (ref 30.0–36.0)
MCV: 95.7 fL (ref 80.0–100.0)
Platelets: 142 10*3/uL — ABNORMAL LOW (ref 150–400)
RBC: 3 MIL/uL — ABNORMAL LOW (ref 4.22–5.81)
RDW: 15.4 % (ref 11.5–15.5)
WBC: 7.9 10*3/uL (ref 4.0–10.5)
nRBC: 0 % (ref 0.0–0.2)

## 2021-09-22 LAB — BASIC METABOLIC PANEL
Anion gap: 7 (ref 5–15)
BUN: 31 mg/dL — ABNORMAL HIGH (ref 8–23)
CO2: 22 mmol/L (ref 22–32)
Calcium: 8.9 mg/dL (ref 8.9–10.3)
Chloride: 107 mmol/L (ref 98–111)
Creatinine, Ser: 1.35 mg/dL — ABNORMAL HIGH (ref 0.61–1.24)
GFR, Estimated: 52 mL/min — ABNORMAL LOW (ref >60)
Glucose, Bld: 103 mg/dL — ABNORMAL HIGH (ref 70–99)
Potassium: 4.5 mmol/L (ref 3.5–5.1)
Sodium: 136 mmol/L (ref 135–145)

## 2021-09-22 SURGERY — ESOPHAGOGASTRODUODENOSCOPY (EGD) WITH PROPOFOL
Anesthesia: Monitor Anesthesia Care

## 2021-09-22 MED ORDER — PROPOFOL 10 MG/ML IV BOLUS
INTRAVENOUS | Status: DC | PRN
  Administered 2021-09-22: 20 mg via INTRAVENOUS

## 2021-09-22 MED ORDER — PROPOFOL 500 MG/50ML IV EMUL
INTRAVENOUS | Status: DC | PRN
  Administered 2021-09-22: 125 ug/kg/min via INTRAVENOUS

## 2021-09-22 SURGICAL SUPPLY — 15 items

## 2021-09-22 NOTE — Progress Notes (Signed)
Physical Therapy Treatment Patient Details Name: Matthew Mahoney MRN: [id number] DOB: 1936-06-30 Today's Date: 09/22/2021   History of Present Illness Pt is an 85 y/o male admitted 10/12 secondary to symptomatic bradycardia, weakness, and hypotension. Pt also with RLE cellulitis. PMH includes prostate cancer, CVA, HTN, COPD, alcohol abuse, tobacco use, CHF, and afib.    PT Comments    Pt making gradual progress.  Do continue to recommend SNF as pt lives alone, requiring assist, fall risk, and fatigues easily.     Recommendations for follow up therapy are one component of a multi-disciplinary discharge planning process, led by the attending physician.  Recommendations may be updated based on patient status, additional functional criteria and insurance authorization.  Follow Up Recommendations  SNF;Supervision for mobility/OOB     Equipment Recommendations       Recommendations for Other Services       Precautions / Restrictions Precautions Precautions: Fall     Mobility  Bed Mobility               General bed mobility comments: in chair    Transfers Overall transfer level: Needs assistance Equipment used: Rolling walker (2 wheeled) Transfers: Sit to/from Stand Sit to Stand: Mod assist         General transfer comment: Cues for hand placement with mod A to rise; cues for controlled descent and backing all the way to chair.  Ambulation/Gait Ambulation/Gait assistance: Min assist Gait Distance (Feet): 35 Feet Assistive device: Rolling walker (2 wheeled) Gait Pattern/deviations: Step-to pattern;Decreased stride length;Shuffle     General Gait Details: Unsteadiness requiring min A for balance and RW.  Cues for RW proximity and direction   Stairs             Wheelchair Mobility    Modified Rankin (Stroke Patients Only)       Balance Overall balance assessment: Needs assistance Sitting-balance support: No upper extremity supported;Feet  supported Sitting balance-Leahy Scale: Good     Standing balance support: Bilateral upper extremity supported;During functional activity Standing balance-Leahy Scale: Poor Standing balance comment: Reliant on BUE support                            Cognition Arousal/Alertness: Awake/alert Behavior During Therapy: WFL for tasks assessed/performed Overall Cognitive Status: Within Functional Limits for tasks assessed                                        Exercises General Exercises - Lower Extremity Ankle Circles/Pumps: AROM;Both;10 reps;Seated Long Arc Quad: AROM;Both;10 reps;Seated Hip Flexion/Marching: AAROM;Both;10 reps;Seated Other Exercises Other Exercises: Hip abd pillow squeeze x 10 Other Exercises: For all AROM exercises - cues for controlled motion through full range Other Exercises: Also did 5x sit to stand (improving to min A)    General Comments General comments (skin integrity, edema, etc.): VSS      Pertinent Vitals/Pain Pain Assessment: No/denies pain    Home Living                      Prior Function            PT Goals (current goals can now be found in the care plan section) Progress towards PT goals: Progressing toward goals    Frequency    Min 2X/week      PT Plan Current plan  remains appropriate    Co-evaluation              AM-PAC PT "6 Clicks" Mobility   Outcome Measure  Help needed turning from your back to your side while in a flat bed without using bedrails?: A Little Help needed moving from lying on your back to sitting on the side of a flat bed without using bedrails?: A Lot Help needed moving to and from a bed to a chair (including a wheelchair)?: A Little Help needed standing up from a chair using your arms (e.g., wheelchair or bedside chair)?: A Lot Help needed to walk in hospital room?: A Little Help needed climbing 3-5 steps with a railing? : Total 6 Click Score: 14    End of  Session Equipment Utilized During Treatment: Gait belt Activity Tolerance: Patient tolerated treatment well Patient left: in chair;with call bell/phone within reach;with chair alarm set Nurse Communication: Mobility status PT Visit Diagnosis: Unsteadiness on feet (R26.81);Muscle weakness (generalized) (M62.81);History of falling (Z91.81);Repeated falls (R29.6)     Time: 1278-7183 PT Time Calculation (min) (ACUTE ONLY): 21 min  Charges:  $Gait Training: 8-22 mins                     Abran Richard, PT Acute Rehab Services Pager [phone] [facility] 09/22/2021, 5:21 PM

## 2021-09-22 NOTE — Progress Notes (Signed)
Mobility Specialist Progress Note:   09/22/21 1100  Therapy Vitals  Pulse Rate 73  Mobility  Activity Ambulated in room  Level of Assistance Moderate assist, patient does 50-74%  Assistive Device Front wheel walker  Distance Ambulated (ft) 20 ft  Mobility Out of bed to chair with meals  Mobility Response Tolerated well  Mobility performed by Mobility specialist  Bed Position Chair  $Mobility charge 1 Mobility   Pre Mobility: HR 73 bpm Post Mobility: HR 86 bpm  Pt received in bed, agreed to mobility. Required modA to stand from EOB. Pt stated he felt weak today. Ambulated 20' in room with RW and minA. Left in chair with all needs met, eating breakfast.   Nelta Numbers Mobility Specialist  Phone [phone]

## 2021-09-22 NOTE — Interval H&P Note (Signed)
History and Physical Interval Note:  09/22/2021 7:40 AM  Matthew Mahoney  has presented today for surgery, with the diagnosis of Anemia.  The various methods of treatment have been discussed with the patient and family. After consideration of risks, benefits and other options for treatment, the patient has consented to  Procedure(s): ESOPHAGOGASTRODUODENOSCOPY (EGD) WITH PROPOFOL (N/A) as a surgical intervention.  The patient's history has been reviewed, patient examined, no change in status, stable for surgery.  I have reviewed the patient's chart and labs.  Questions were answered to the patient's satisfaction.     Jackquline Denmark

## 2021-09-22 NOTE — TOC Progression Note (Signed)
Transition of Care [facility]) - Progression Note    Patient Details  Name: Matthew Mahoney MRN: [id number] Date of Birth: 03-23-1936  Transition of [facility] CM/SW [place], [place] Phone Number: 09/22/2021, 4:35 PM  Clinical Narrative:     Family accepted bed offer with [facility] updated RN- patient needs to be seen by PT and requested covid test.   CSW will continue to follow and assist with discharge planning.  [name], MSW, LCSW Clinical Social Worker    Expected Discharge Plan: Skilled Nursing Facility Barriers to Discharge: Ship broker, Continued Medical Work up, SNF Pending bed offer  Expected Discharge Plan and Services Expected Discharge Plan: [place] In-house Referral: Clinical Social Work     Living arrangements for the past 2 months: Single Family Home                                       Social Determinants of Health (SDOH) Interventions    Readmission Risk Interventions No flowsheet data found.

## 2021-09-22 NOTE — Progress Notes (Signed)
Patient refused his midnight vitals

## 2021-09-22 NOTE — TOC Initial Note (Signed)
Transition of Care [facility]) - Initial/Assessment Note    Patient Details  Name: Matthew Mahoney MRN: [id number] Date of Birth: 01-30-1936  Transition of Care [facility]) CM/SW Contact:    Vinie Sill, LCSW Phone Number: 09/22/2021, 3:10 PM  Clinical Narrative:                  CSW met with patient and his sister,[name].CSW introduced self and explained role. CSW discussed with family therapy recommendation of short term rehab at [facility]. Patient states he understands and agrees with the recommendation. CSW explained the SNF process. Patient states he has been to SNF before and prefers  [facility] or [facility] Patient has received covid vaccine. No questions or concerns noted at this time.   CSW will provide bed offers once available. CSW will keep following and assist with discharge planning.   [name], MSW, LCSW Clinical Social Worker    Expected Discharge Plan: Skilled Nursing Facility Barriers to Discharge: Ship broker, Continued Medical Work up, SNF Pending bed offer   Patient Goals and CMS Choice Patient states their goals for this hospitalization and ongoing recovery are:: To be able to return home CMS Medicare.gov Compare Post Acute Care list provided to:: Patient Choice offered to / list presented to : Patient  Expected Discharge Plan and Services Expected Discharge Plan: [place] In-house Referral: Clinical Social Work     Living arrangements for the past 2 months: [place]                                      Prior Living Arrangements/Services Living arrangements for the past 2 months: Single Family Home Lives with:: Self Patient language and need for interpreter reviewed:: No        Need for Family Participation in Patient Care: Yes (Comment) Care giver support system in place?: Yes (comment)   Criminal Activity/Legal Involvement Pertinent to Current Situation/Hospitalization: No - Comment as needed  Activities  of Daily Living Home Assistive Devices/Equipment: Cane (specify quad or straight), Walker (specify type) ADL Screening (condition at time of admission) Patient's cognitive ability adequate to safely complete daily activities?: Yes Is the patient deaf or have difficulty hearing?: No Does the patient have difficulty seeing, even when wearing glasses/contacts?: No Does the patient have difficulty concentrating, remembering, or making decisions?: No Patient able to express need for assistance with ADLs?: Yes Does the patient have difficulty dressing or bathing?: No Independently performs ADLs?: Yes (appropriate for developmental age) Does the patient have difficulty walking or climbing stairs?: Yes Weakness of Legs: Both Weakness of Arms/Hands: None  Permission Sought/Granted Permission sought to share information with : Family Supports Permission granted to share information with : Yes, Verbal Permission Granted  Share Information with NAME: Tessie Eke  Permission granted to share info w AGENCY: SNFs  Permission granted to share info w Relationship: sister  Permission granted to share info w Contact Information: [phone]  Emotional Assessment Appearance:: Appears stated age Attitude/Demeanor/Rapport: Engaged Affect (typically observed): Appropriate Orientation: : Oriented to Self, Oriented to Place, Oriented to  Time, Oriented to Situation Alcohol / Substance Use: Alcohol Use, Tobacco Use Psych Involvement: No (comment)  Admission diagnosis:  Generalized weakness [R53.1] Symptomatic bradycardia [R00.1] Patient Active Problem List   Diagnosis Date Noted   Pressure injury of skin 09/18/2021   Protein-calorie malnutrition, severe 09/18/2021   Ambulatory dysfunction 09/17/2021   Symptomatic bradycardia 09/17/2021  Abrasion of lower leg with infection 09/17/2021   Peripheral arterial disease ([place]) 01/20/2017   Nonsustained ventricular tachycardia 05/09/2015   Closed fracture of  femur, neck ([place]) 05/06/2015   Chronic combined systolic and diastolic congestive heart failure ([place]) 05/06/2015   GERD (gastroesophageal reflux disease) 10/11/2013   CAD (coronary artery disease) 10/11/2013   Atrial flutter ([place]) 09/25/2013   Conduction disorder of the heart 09/25/2013   Thrombocytopenia ([place]) 09/25/2013   Atrial fibrillation ([place]) 09/23/2013   Secondary cardiomyopathy, unspecified 09/23/2013   CVA (cerebral infarction) 09/19/2013   CKD (chronic kidney disease), stage III ([place]) 09/19/2013   Alcohol abuse 01/21/2013   Tobacco abuse 01/21/2013   Hyponatremia 01/21/2013   Hypertension    COPD (chronic obstructive pulmonary disease) ([place])    Choledocholithiasis    Coronary arteriosclerosis    Hyperlipidemia    Ichthyosis    Mitral regurgitation    Onychomycosis    Vitamin D deficiency    Aortic stenosis    Prostate cancer ([place])    Ascites    History of colonic diverticulitis    Peripheral vascular disease ([place]) 02/18/2011   OTHER SYMPTOMS INVOLVING CARDIOVASCULAR SYSTEM 02/18/2011   PCP:  [name], MD Pharmacy:   [id number] [id number]: [phone]: [phone]    Social Determinants of Health (SDOH) Interventions    Readmission Risk Interventions No flowsheet data found.

## 2021-09-22 NOTE — Transfer of Care (Signed)
Immediate Anesthesia Transfer of Care Note  Patient: Matthew Mahoney  Procedure(s) Performed: ESOPHAGOGASTRODUODENOSCOPY (EGD) WITH PROPOFOL BIOPSY ESOPHAGEAL DILATION  Patient Location: PACU  Anesthesia Type:MAC  Level of Consciousness: drowsy and patient cooperative  Airway & Oxygen Therapy: Patient Spontanous Breathing and Patient connected to nasal cannula oxygen  Post-op Assessment: Report given to RN and Post -op Vital signs reviewed and stable  Post vital signs: Reviewed and stable  Last Vitals:  Vitals Value Taken Time  BP 98/59 09/22/21 0807  Temp    Pulse 69 09/22/21 0809  Resp 15 09/22/21 0809  SpO2 100 % 09/22/21 0809  Vitals shown include unvalidated device data.  Last Pain:  Vitals:   09/22/21 0725  TempSrc: Temporal  PainSc: 0-No pain         Complications: No notable events documented.

## 2021-09-22 NOTE — Progress Notes (Signed)
PROGRESS NOTE    Matthew Mahoney  WIO:[id number] DOB: May 16, 1936 DOA: 09/17/2021 PCP: Matthew Bien, MD   Chief Complaint  Patient presents with   Bradycardia    Brief Narrative/Hospital Course:  Matthew Mahoney, 85 y.o. male with PMH of alcohol abuse, A. fib on Eliquis, chronic combined CHF, COPD, CVA, HTN, HLD, prostate cancer, PVD presented with weakness, and recent fall about 2 weeks PTA and has been having bruising on the right leg and not healing and was having pain and trouble walking.  Patient was seen in the ED and admitted for further management. Patient had bradycardia seen by cardiology no further plans and does not need PPM.  Patient is being treated for lower extremity cellulitis and anemia.  Underwent EGD 10/17 with Schatzki's ring, small hiatal hernia, erosive gastritis no active GI bleeding, esophagus dilated.  Patient declined colonoscopy. Subjective:  Seen this morning he just back from endoscopy and appears somewhat groggy.  No complaints.  Lower leg with wrapping in place mild redness hyperpigmentation  present  Assessment & Plan:  RLE cellulitis LAG:TXMIWOEHO on Augmentin from 10/15- stop date 10/20.LE cellulitis improving continue dressing. he is afebrile and no leukocytosis..  Anemia unclear etiology Anemia of chronic disease History of duodenal ulcers: History of previous duodenal ulcer from EGD done in 2014.Eliquis discontinued by cardiology given the anemia and fall and they are planning for outpatient follow-up in 2 to 4 weeks. Underwent EGD 10/17 with Schatzki's ring, small hiatal hernia, erosive gastritis no active GI bleeding, esophagus dilated.  Patient declined colonoscopy.  GI recommended to  resume Eliquis from 10/18.continue PPI daily and follow-up biopsy results with GI.   Recent Labs  Lab 09/20/21 1609 09/20/21 2204 09/21/21 0457 09/21/21 0942 09/22/21 0101  HGB 8.7* 9.8* 9.2* 9.9* 9.3*  HCT 26.8* 30.4* 28.4* 30.1* 28.7*    AKI likely  prerenal in the setting of dehydration: Peaked 1.9 now improved to 1.3 encourage oral hydration.  Net IO Since Admission: 1,436.09 mL [09/22/21 1056]  Recent Labs  Lab 09/17/21 1149 09/18/21 0222 09/19/21 0536 09/20/21 0624 09/22/21 0101  BUN 56* 39* 41* 38* 31*  CREATININE 1.99* 1.18 1.60* 1.41* 1.35*    [place] metabolic acidosis: Likely with AKI.  Monitor  A. fib with slow ventricular response  Right bundle branch block/left anterior fascicular block : Patient had slow ventricular rate/bradycardia bradycardia: Seen by cardiology no plan/no recommendation for PPM, advised to keep off AV node blocking agents.  Eliquis discontinued due to anemia, underwent EGD and GI okay to resume Eliquis 10/18.    Chronic combined systolic and diastolic congestive heart failure : Not in failure, euvolemic.  Continues Entresto, Lipitor,  amlodipine. Peripheral vascular disease: Continue Lipitor Hypertension: Controlled on low-dose amlodipine.  Weakness/fatigue/generalized deconditioning/ multiple falls: Continue PT OT, will need SNF. TOC working on it. Alcohol use disorder no evidence of withdrawal continue to monitor.  Continue to  COPD: Not in exacerbation, continue Incruse Ellipta and Breo.  Coughing/choking with pills: No previous history of dysphagia reports x 1 episode-If recurs will get a speech eval.He is going for EGD in am.  Tobacco abuse: Continue nicotine patch  Severe malnutrition augment diet as below  Nutrition Problem: Severe Malnutrition Etiology: chronic illness (CHF, COPD) Signs/Symptoms: severe fat depletion, severe muscle depletion Interventions: Ensure Enlive (each supplement provides 350kcal and 20 grams of protein), Juven, MVI, Liberalize Diet Upper lower buttock pressure injury stage II, POA-see below  DVT prophylaxis: Place and maintain sequential compression device Start: 09/21/21 0858NO chemica  prophylaxis 2/2 anemia.  Code Status:   Code Status: Full Code Family  Communication: plan of care discussed with patient at bedside. Status is: Inpatient  Remains inpatient appropriate because: Ongoing management of anemia, debility and cellulitis treatment and will need placement. Anticipate discharge: SNF hopefully tomorrow in 2 days.    Objective: Vitals: Today's Vitals   09/22/21 0725 09/22/21 0820 09/22/21 0835 09/22/21 0904  BP: (!) 157/74   (!) 156/91  Pulse: 71   76  Resp: (!) 23   16  Temp: (!) 97.5 F (36.4 C) 97.8 F (36.6 C) (!) 97 F (36.1 C) (!) 97.1 F (36.2 C)  TempSrc: Temporal   Axillary  SpO2: 97%   98%  Weight:      Height:      PainSc: 0-No pain 0-No pain 0-No pain    Physical Examination: General exam: AAO, slightly groggy post endoscopy older than stated age, weak appearing. HEENT:Oral mucosa moist, Ear/Nose WNL grossly, dentition normal. Respiratory system: bilaterally diminished, no use of accessory muscle Cardiovascular system: S1 & S2 +, No JVD,. Gastrointestinal system: Abdomen soft, NT,ND, BS+ Nervous System:Alert, awake, moving extremities and grossly nonfocal Extremities: Lower leg with wrapping in place mild redness hyperpigmentation  present Skin: No rashes,no icterus. MSK: Normal muscle bulk,tone, power   Medications reviewed:  Scheduled Meds:  amLODipine  2.5 mg Oral Daily   amoxicillin-clavulanate  1 tablet Oral Q12H   atorvastatin  40 mg Oral QPM   docusate sodium  100 mg Oral BID   feeding supplement  237 mL Oral BID BM   fluticasone furoate-vilanterol  1 puff Inhalation Daily   folic acid  1 mg Oral Daily   latanoprost  1 drop Both Eyes QHS   multivitamin with minerals  1 tablet Oral Daily   nicotine  14 mg Transdermal Daily   nutrition supplement (JUVEN)  1 packet Oral BID BM   pantoprazole (PROTONIX) IV  40 mg Intravenous Q12H   sacubitril-valsartan  1 tablet Oral BID   sodium chloride flush  3 mL Intravenous Q12H   thiamine  100 mg Oral Daily   umeclidinium bromide  1 puff Inhalation Daily    Continuous Infusions:   Pressure Injury 09/17/21 Buttocks Medial;Upper;Lower Stage 2 -  Partial thickness loss of dermis presenting as a shallow open injury with a red, pink wound bed without slough. redness (Active)  09/17/21 1859  Location: [place]  Location Orientation: Medial;Upper;Lower  Staging: Stage 2 -  Partial thickness loss of dermis presenting as a shallow open injury with a red, pink wound bed without slough.  Wound Description (Comments): redness  Present on Admission: Yes    Diet Order             Diet Heart Room service appropriate? Yes; Fluid consistency: Thin  Diet effective now                   Nutrition Problem: Severe Malnutrition Etiology: chronic illness (CHF, COPD) Signs/Symptoms: severe fat depletion, severe muscle depletion Interventions: Ensure Enlive (each supplement provides 350kcal and 20 grams of protein), Juven, MVI, Liberalize Diet  Intake/Output  Intake/Output Summary (Last 24 hours) at 09/22/2021 1056 Last data filed at 09/22/2021 0809 Gross per 24 hour  Intake 100 ml  Output 200 ml  Net -100 ml    Intake/Output from previous day: 10/16 0701 - 10/17 0700 In: -  Out: 225 [Urine:225] Net IO Since Admission: 1,436.09 mL [09/22/21 1056]   Weight change:   Wt Readings from  Last 3 Encounters:  09/17/21 80 kg  11/21/18 88.5 kg  02/26/17 92.6 kg     Consultants:see note  Procedures:see note Antimicrobials: Anti-infectives (From admission, onward)    Start     Dose/Rate Route Frequency Ordered Stop   09/20/21 1630  amoxicillin-clavulanate (AUGMENTIN) 875-125 MG per tablet 1 tablet        1 tablet Oral Every 12 hours 09/20/21 1542 09/25/21 2159   09/19/21 1930  cefTRIAXone (ROCEPHIN) 2 g in sodium chloride 0.9 % 100 mL IVPB  Status:  Discontinued       See Hyperspace for full Linked Orders Report.   2 g 200 mL/hr over 30 Minutes Intravenous Every 24 hours 09/19/21 1112 09/20/21 1542   09/19/21 1215  metroNIDAZOLE (FLAGYL)  tablet 500 mg  Status:  Discontinued        500 mg Oral Every 12 hours 09/19/21 1116 09/20/21 1542   09/19/21 1200  metroNIDAZOLE (FLAGYL) IVPB 500 mg  Status:  Discontinued       See Hyperspace for full Linked Orders Report.   500 mg 100 mL/hr over 60 Minutes Intravenous Every 12 hours 09/19/21 1112 09/19/21 1116   09/17/21 1930  cefTRIAXone (ROCEPHIN) 2 g in sodium chloride 0.9 % 100 mL IVPB  Status:  Discontinued       See Hyperspace for full Linked Orders Report.   2 g 200 mL/hr over 30 Minutes Intravenous Every 24 hours 09/17/21 1838 09/19/21 1112   09/17/21 1930  metroNIDAZOLE (FLAGYL) IVPB 500 mg  Status:  Discontinued       See Hyperspace for full Linked Orders Report.   500 mg 100 mL/hr over 60 Minutes Intravenous Every 12 hours 09/17/21 1838 09/19/21 1112      Culture/Microbiology    Component Value Date/Time   SDES URINE, CATHETERIZED 03/16/2013 1129   SPECREQUEST NONE 03/16/2013 1129   CULT NO GROWTH 03/16/2013 1129   REPTSTATUS 03/17/2013 FINAL 03/16/2013 1129    Other culture-see note  Unresulted Labs (From admission, onward)     Start     Ordered   09/22/21 0981  Basic metabolic panel  Daily,   R     Question:  Specimen collection method  Answer:  Lab=Lab collect   09/21/21 1421   09/22/21 0500  CBC  Daily,   R     Question:  Specimen collection method  Answer:  Lab=Lab collect   09/21/21 1425   09/20/21 1559  Occult blood card to lab, stool  Once,   R        09/20/21 1559            Data Reviewed: I have personally reviewed following labs and imaging studies CBC: Recent Labs  Lab 09/17/21 1149 09/18/21 0222 09/18/21 0432 09/19/21 0536 09/20/21 0131 09/20/21 1609 09/20/21 2204 09/21/21 0457 09/21/21 0942 09/22/21 0101  WBC 7.4   < > 7.8 7.6 6.1  --   --  7.3  --  7.9  NEUTROABS 5.0  --   --   --   --   --   --   --   --   --   HGB 9.4*   < > 8.7* 9.2* 8.2* 8.7* 9.8* 9.2* 9.9* 9.3*  HCT 29.3*   < > 26.1* 27.6* 25.0* 26.8* 30.4* 28.4* 30.1*  28.7*  MCV 97.7   < > 93.9 94.8 94.7  --   --  95.3  --  95.7  PLT 121*   < > 112* 133* 117*  --   --  132*  --  142*   < > = values in this interval not displayed.    Basic Metabolic Panel: Recent Labs  Lab 09/17/21 1149 09/18/21 0222 09/19/21 0536 09/20/21 0624 09/22/21 0101  NA 136 133* 136 137 136  K 5.5* 4.4 4.0 4.1 4.5  CL 107 108 108 106 107  CO2 19* 14* 19* 21* 22  GLUCOSE 101* 83 107* 102* 103*  BUN 56* 39* 41* 38* 31*  CREATININE 1.99* 1.18 1.60* 1.41* 1.35*  CALCIUM 8.3* 7.3* 8.0* 8.4* 8.9    GFR: Estimated Creatinine Clearance: 45.5 mL/min (A) (by C-G formula based on SCr of 1.35 mg/dL (H)). Liver Function Tests: Recent Labs  Lab 09/17/21 1149  AST 13*  ALT 8  ALKPHOS 67  BILITOT 1.0  PROT 5.5*  ALBUMIN 2.8*    No results for input(s): LIPASE, AMYLASE in the last 168 hours. No results for input(s): AMMONIA in the last 168 hours. Coagulation Profile: No results for input(s): INR, PROTIME in the last 168 hours. Cardiac Enzymes: No results for input(s): CKTOTAL, CKMB, CKMBINDEX, TROPONINI in the last 168 hours. BNP (last 3 results) No results for input(s): PROBNP in the last 8760 hours. HbA1C: No results for input(s): HGBA1C in the last 72 hours. CBG: No results for input(s): GLUCAP in the last 168 hours. Lipid Profile: No results for input(s): CHOL, HDL, LDLCALC, TRIG, CHOLHDL, LDLDIRECT in the last 72 hours. Thyroid Function Tests: No results for input(s): TSH, T4TOTAL, FREET4, T3FREE, THYROIDAB in the last 72 hours. Anemia Panel: Recent Labs    09/20/21 0643 09/20/21 1609  FERRITIN  --  288  TIBC  --  158*  IRON  --  47  RETICCTPCT 1.5  --     Sepsis Labs: No results for input(s): PROCALCITON, LATICACIDVEN in the last 168 hours.  Recent Results (from the past 240 hour(s))  Resp Panel by RT-PCR (Flu A&B, Covid) Nasopharyngeal Swab     Status: None   Collection Time: 09/17/21 11:50 AM   Specimen: Nasopharyngeal Swab; Nasopharyngeal(NP)  swabs in vial transport medium  Result Value Ref Range Status   SARS Coronavirus 2 by RT PCR NEGATIVE NEGATIVE Final    Comment: (NOTE) SARS-CoV-2 target nucleic acids are NOT DETECTED.  The SARS-CoV-2 RNA is generally detectable in upper respiratory specimens during the acute phase of infection. The lowest concentration of SARS-CoV-2 viral copies this assay can detect is 138 copies/mL. A negative result does not preclude SARS-Cov-2 infection and should not be used as the sole basis for treatment or other patient management decisions. A negative result may occur with  improper specimen collection/handling, submission of specimen other than nasopharyngeal swab, presence of viral mutation(s) within the areas targeted by this assay, and inadequate number of viral copies(<138 copies/mL). A negative result must be combined with clinical observations, patient history, and epidemiological information. The expected result is Negative.  Fact Sheet for Patients:  EntrepreneurPulse.com.au  Fact Sheet for Healthcare Providers:  IncredibleEmployment.be  This test is no t yet approved or cleared by the [place] FDA and  has been authorized for detection and/or diagnosis of SARS-CoV-2 by FDA under an Emergency Use Authorization (EUA). This EUA will remain  in effect (meaning this test can be used) for the duration of the COVID-19 declaration under Section 564(b)(1) of the Act, 21 U.S.C.section 360bbb-3(b)(1), unless the authorization is terminated  or revoked sooner.       Influenza A by PCR NEGATIVE NEGATIVE Final   Influenza B by PCR NEGATIVE  NEGATIVE Final    Comment: (NOTE) The Xpert Xpress SARS-CoV-2/FLU/RSV plus assay is intended as an aid in the diagnosis of influenza from Nasopharyngeal swab specimens and should not be used as a sole basis for treatment. Nasal washings and aspirates are unacceptable for Xpert Xpress  SARS-CoV-2/FLU/RSV testing.  Fact Sheet for Patients: EntrepreneurPulse.com.au  Fact Sheet for Healthcare Providers: IncredibleEmployment.be  This test is not yet approved or cleared by the [place] FDA and has been authorized for detection and/or diagnosis of SARS-CoV-2 by FDA under an Emergency Use Authorization (EUA). This EUA will remain in effect (meaning this test can be used) for the duration of the COVID-19 declaration under Section 564(b)(1) of the Act, 21 U.S.C. section 360bbb-3(b)(1), unless the authorization is terminated or revoked.  Performed at [facility], [place] [address], [place], [place] [address]       Radiology Studies: No results found.   LOS: 5 days   Antonieta Pert, MD [facility]  09/22/2021, 10:56 AM

## 2021-09-22 NOTE — Anesthesia Preprocedure Evaluation (Signed)
Anesthesia Evaluation  Patient identified by MRN, date of birth, ID band Patient awake    Reviewed: Allergy & Precautions, NPO status , Patient's Chart, lab work & pertinent test results  Airway Mallampati: II  TM Distance: >3 FB Neck ROM: Full    Dental   Pulmonary COPD, Current Smoker,    breath sounds clear to auscultation       Cardiovascular hypertension, Pt. on medications and Pt. on home beta blockers + CAD, + Peripheral Vascular Disease and +CHF  + dysrhythmias Atrial Fibrillation  Rhythm:Irregular Rate:Normal     Neuro/Psych negative neurological ROS     GI/Hepatic Neg liver ROS, GERD  ,  Endo/Other  negative endocrine ROS  Renal/GU Renal InsufficiencyRenal disease     Musculoskeletal   Abdominal   Peds  Hematology negative hematology ROS (+)   Anesthesia Other Findings   Reproductive/Obstetrics                             Anesthesia Physical Anesthesia Plan  ASA: 3  Anesthesia Plan: MAC   Post-op Pain Management:    Induction:   PONV Risk Score and Plan: 0 and Propofol infusion and Treatment may vary due to age or medical condition  Airway Management Planned: Natural Airway, Nasal Cannula and Simple Face Mask  Additional Equipment: None  Intra-op Plan:   Post-operative Plan:   Informed Consent: I have reviewed the patients History and Physical, chart, labs and discussed the procedure including the risks, benefits and alternatives for the proposed anesthesia with the patient or authorized representative who has indicated his/her understanding and acceptance.       Plan Discussed with: CRNA  Anesthesia Plan Comments:         Anesthesia Quick Evaluation

## 2021-09-22 NOTE — Op Note (Addendum)
[facility] Patient Name: Matthew Mahoney Procedure Date : 09/22/2021 MRN: [id number] Attending MD: Jackquline Denmark , MD Date of Birth: 11/29/1936 CSN: [id number] Age: 85 Admit Type: Inpatient Procedure:                Upper GI endoscopy Indications:              Iron deficiency anemia. H/O DU and dysphagia. Pt                            with Afib in need of eliquis. Neg CT AP without                            contrast this adm Providers:                Jackquline Denmark, MD, Doristine Johns, RN, Fransico Setters                            Mbumina, Technician Referring MD:              Medicines:                Monitored Anesthesia Care Complications:            No immediate complications. Estimated Blood Loss:     Estimated blood loss: none. Procedure:                Pre-Anesthesia Assessment:                           - Prior to the procedure, a History and Physical                            was performed, and patient medications and                            allergies were reviewed. The patient's tolerance of                            previous anesthesia was also reviewed. The risks                            and benefits of the procedure and the sedation                            options and risks were discussed with the patient.                            All questions were answered, and informed consent                            was obtained. Prior Anticoagulants: The patient has                            taken Eliquis (apixaban), last dose was 2 days  prior to procedure. ASA Grade Assessment: III - A                            patient with severe systemic disease. After                            reviewing the risks and benefits, the patient was                            deemed in satisfactory condition to undergo the                            procedure.                           After obtaining informed consent, the endoscope was                             passed under direct vision. Throughout the                            procedure, the patient's blood pressure, pulse, and                            oxygen saturations were monitored continuously. The                            GIF-H190 ([id number] Olympus endoscope was introduced                            through the mouth, and advanced to the second part                            of duodenum. The upper GI endoscopy was                            accomplished without difficulty. The patient                            tolerated the procedure well. Scope In: Scope Out: Findings:      A non-obstructing Schatzki ring was found at the gastroesophageal       junction, 40 cm from the anal verge with luminal diameter of       approximately 12 mm. The scope was withdrawn. Dilation was performed       with a Maloney dilator with mild resistance at 48 Fr.      A small hiatal hernia was present.      Scattered moderate inflammation characterized by erosions and erythema       was found in the gastric body and in the gastric antrum. Biopsies were       taken with a cold forceps for histology.      The examined duodenum was normal. Biopsies for histology were taken with       a cold forceps for evaluation of celiac disease. Impression:               -  Non-obstructing and moderate Schatzki ring.                            Dilated.                           - Small hiatal hernia.                           - Erosive gastritis. No active UGI bleeding. Recommendation:           - Continue Protonix 40 mg p.o. QD.                           - Resume previous diet.                           - Resume Eliquis (apixaban) at prior dose tomorrow.                           - Await pathology results.                           - Trend CBC.                           - His anemia can be explained, in part, due to                            CKD3. He doesnot want colonoscopy at this time. If                             he continues to have unexplained anemia or heme                            positive stools in future, colon can be considered                            as outpt.                           - Return to GI clinic in 12 weeks in APP clinic/Dr                            Dorsey.                           - The findings and recommendations were discussed                            with the patient's family (sister [name]).                           - Will sign off for now. Pl call with any ?/concerns Procedure Code(s):        --- Professional ---  35825, Esophagogastroduodenoscopy, flexible,                            transoral; with biopsy, single or multiple                           43450, Dilation of esophagus, by unguided sound or                            bougie, single or multiple passes Diagnosis Code(s):        --- Professional ---                           K22.2, Esophageal obstruction                           K44.9, Diaphragmatic hernia without obstruction or                            gangrene                           K29.70, Gastritis, unspecified, without bleeding                           D50.9, Iron deficiency anemia, unspecified CPT copyright 2019 [employer]. All rights reserved. The codes documented in this report are preliminary and upon coder review may  be revised to meet current compliance requirements. Jackquline Denmark, MD 09/22/2021 8:13:28 AM This report has been signed electronically. Number of Addenda: 0

## 2021-09-23 DIAGNOSIS — R001 Bradycardia, unspecified: Secondary | ICD-10-CM | POA: Diagnosis not present

## 2021-09-23 LAB — BASIC METABOLIC PANEL
Anion gap: 7 (ref 5–15)
BUN: 34 mg/dL — ABNORMAL HIGH (ref 8–23)
CO2: 23 mmol/L (ref 22–32)
Calcium: 8.9 mg/dL (ref 8.9–10.3)
Chloride: 109 mmol/L (ref 98–111)
Creatinine, Ser: 1.31 mg/dL — ABNORMAL HIGH (ref 0.61–1.24)
GFR, Estimated: 54 mL/min — ABNORMAL LOW (ref >60)
Glucose, Bld: 99 mg/dL (ref 70–99)
Potassium: 5 mmol/L (ref 3.5–5.1)
Sodium: 139 mmol/L (ref 135–145)

## 2021-09-23 LAB — CBC
HCT: 28 % — ABNORMAL LOW (ref 39.0–52.0)
Hemoglobin: 9.3 g/dL — ABNORMAL LOW (ref 13.0–17.0)
MCH: 31.5 pg (ref 26.0–34.0)
MCHC: 33.2 g/dL (ref 30.0–36.0)
MCV: 94.9 fL (ref 80.0–100.0)
Platelets: 141 10*3/uL — ABNORMAL LOW (ref 150–400)
RBC: 2.95 MIL/uL — ABNORMAL LOW (ref 4.22–5.81)
RDW: 15.3 % (ref 11.5–15.5)
WBC: 8.4 10*3/uL (ref 4.0–10.5)
nRBC: 0 % (ref 0.0–0.2)

## 2021-09-23 LAB — SURGICAL PATHOLOGY

## 2021-09-23 LAB — SARS CORONAVIRUS 2 (TAT 6-24 HRS): SARS Coronavirus 2: NEGATIVE

## 2021-09-23 MED ORDER — JUVEN PO PACK: 1.0000 | PACK | Freq: Two times a day (BID) | ORAL | 0 refills | Status: DC

## 2021-09-23 MED ORDER — FOLIC ACID 1 MG PO TABS: 1.0000 mg | ORAL_TABLET | Freq: Every day | ORAL | Status: DC

## 2021-09-23 MED ORDER — AMOXICILLIN-POT CLAVULANATE 875-125 MG PO TABS: 1.0000 | ORAL_TABLET | Freq: Two times a day (BID) | ORAL | Status: AC

## 2021-09-23 MED ORDER — ENSURE ENLIVE PO LIQD: 237.0000 mL | Freq: Two times a day (BID) | ORAL | 12 refills | Status: DC

## 2021-09-23 MED ORDER — APIXABAN 2.5 MG PO TABS
2.5000 mg | ORAL_TABLET | Freq: Two times a day (BID) | ORAL | Status: DC
  Administered 2021-09-23 – 2021-09-24 (×3): 2.5 mg via ORAL
  Filled 2021-09-23 (×3): qty 1

## 2021-09-23 MED ORDER — BISACODYL 5 MG PO TBEC
5.0000 mg | DELAYED_RELEASE_TABLET | Freq: Every day | ORAL | 0 refills | Status: DC | PRN
Start: 2021-09-23 — End: 2021-10-24

## 2021-09-23 NOTE — Anesthesia Postprocedure Evaluation (Signed)
Anesthesia Post Note  Patient: Matthew Mahoney  Procedure(s) Performed: ESOPHAGOGASTRODUODENOSCOPY (EGD) WITH PROPOFOL BIOPSY ESOPHAGEAL DILATION     Patient location during evaluation: PACU Anesthesia Type: MAC Level of consciousness: awake and alert Pain management: pain level controlled Vital Signs Assessment: post-procedure vital signs reviewed and stable Respiratory status: spontaneous breathing, nonlabored ventilation, respiratory function stable and patient connected to nasal cannula oxygen Cardiovascular status: stable and blood pressure returned to baseline Postop Assessment: no apparent nausea or vomiting Anesthetic complications: no   No notable events documented.  Last Vitals:  Vitals:   09/23/21 1530 09/23/21 1947  BP: 134/79 123/72  Pulse: 77 90  Resp: 19 16  Temp: 36.8 C 36.7 C  SpO2: 94% 92%    Last Pain:  Vitals:   09/23/21 1947  TempSrc: Axillary  PainSc:                  [name]

## 2021-09-23 NOTE — Progress Notes (Signed)
Occupational Therapy Treatment Patient Details Name: Matthew Mahoney MRN: [id number] DOB: [birth year] Today's Date: 09/23/2021   History of present illness Pt is an 85 y/o male admitted 10/12 secondary to symptomatic bradycardia, weakness, and hypotension. Pt also with RLE cellulitis. PMH includes prostate cancer, CVA, HTN, COPD, alcohol abuse, tobacco use, CHF, and afib.   OT comments  Pt noted with increased confusion during session though pleasant and participatory. Assisted RN with [facility] care [facility] transfers and ADLs after bowel incontinence. Pt benefits from cues for safety due to impulsive behaviors. Pt overall max A for LB ADLs due to legs feeling "weak" and "heavy" per pt. Pt able to progress to Min A for basic pivots though notably fatigued after these tasks. Continue to recommend SNF rehab at [place].   HR 80s    Recommendations for follow up therapy are one component of a multi-disciplinary discharge planning process, led by the attending physician.  Recommendations may be updated based on patient status, additional functional criteria and insurance authorization.    Follow Up Recommendations  SNF    Equipment Recommendations  None recommended by OT    Recommendations for Other Services      Precautions / Restrictions Precautions Precautions: Fall Restrictions Weight Bearing Restrictions: No       Mobility Bed Mobility Overal bed mobility: Needs Assistance Bed Mobility: Sit to Supine       Sit to supine: Mod assist   General bed mobility comments: to get B LE back into bed    Transfers Overall transfer level: Needs assistance Equipment used: Rolling walker (2 wheeled) Transfers: Sit to/from [facility] Sit to Stand: Min assist Stand pivot transfers: Min assist       General transfer comment: Min A to power up from bedside and BSC, Min A to manuever RW and maintain balance with pivot to/from [facility]    Balance Overall balance assessment: Needs  assistance Sitting-balance support: No upper extremity supported;Feet supported Sitting balance-Leahy Scale: Good     Standing balance support: Bilateral upper extremity supported;During functional activity Standing balance-Leahy Scale: Poor Standing balance comment: Reliant on BUE support                           ADL either performed or assessed with clinical judgement   ADL Overall ADL's : Needs assistance/impaired             Lower Body Bathing: Maximal assistance;Sit to/from stand Lower Body Bathing Details (indicate cue type and reason): assist after bowel incontinence to maintain optimal hygiene Upper Body Dressing : Minimal assistance;Sitting Upper Body Dressing Details (indicate cue type and reason): to don clean gown Lower Body Dressing: Maximal assistance;Sit to/from stand Lower Body Dressing Details (indicate cue type and reason): able to assist in donning L sock but unable to don around heel, assist needed to cross LE. Assist to don R sock. pt noted with B LE swelling and reports feet feel heavy/weak Toilet Transfer: Minimal assistance;Stand-pivot;BSC;RW Toilet Transfer Details (indicate cue type and reason): assist to maintain balance, manuver RW Toileting- Clothing Manipulation and Hygiene: Maximal assistance Toileting - Clothing Manipulation Details (indicate cue type and reason): after bowel incontinence       General ADL Comments: Increased confusion noted during session. entering as RN assisting pt who was standing impulsively from chair     Vision   Vision Assessment?: No apparent visual deficits   Perception     Praxis  Cognition Arousal/Alertness: Awake/alert Behavior During Therapy: WFL for tasks assessed/performed Overall Cognitive Status: Impaired/Different from baseline Area of Impairment: Orientation;Memory;Safety/judgement;Awareness                 Orientation Level: Disoriented to;Time   Memory: Decreased short-term  memory   Safety/Judgement: Decreased awareness of safety;Decreased awareness of deficits Awareness: Emergent   General Comments: Questionable memory deficits noted throughout, cues for safety and sequencing  as pt impulsive at times, attempting to get up without assist. reports october with increased time, reports 13th rather than 18th of the month        Exercises     Shoulder Instructions       General Comments HR 80s    Pertinent Vitals/ Pain       Pain Assessment: No/denies pain Pain Intervention(s): Monitored during session  Home Living                                          Prior Functioning/Environment              Frequency  Min 2X/week        Progress Toward Goals  OT Goals(current goals can now be found in the care plan section)  Progress towards OT goals: Progressing toward goals  Acute Rehab OT Goals Patient Stated Goal: to get stronger OT Goal Formulation: With patient Time For Goal Achievement: 10/03/21 Potential to Achieve Goals: Good ADL Goals Pt Will Perform Grooming: with modified independence;standing Pt Will Perform Lower Body Bathing: with modified independence;sitting/lateral leans;sit to/from stand Pt Will Perform Lower Body Dressing: with modified independence;sitting/lateral leans;sit to/from stand Pt Will Transfer to Toilet: with modified independence;ambulating Pt Will Perform Toileting - Clothing Manipulation and hygiene: with modified independence;sitting/lateral leans;sit to/from stand Additional ADL Goal #1: Pt will maintain dynamic standing balance for 5 mins to prepare for IADL tasks at home.  Plan Discharge plan remains appropriate    Co-evaluation                 AM-PAC OT "6 Clicks" Daily Activity     Outcome Measure   Help from another person eating meals?: None Help from another person taking care of personal grooming?: A Little Help from another person toileting, which includes using  toliet, bedpan, or urinal?: A Lot Help from another person bathing (including washing, rinsing, drying)?: A Lot Help from another person to put on and taking off regular upper body clothing?: A Little Help from another person to put on and taking off regular lower body clothing?: A Lot 6 Click Score: 16    End of Session Equipment Utilized During Treatment: Rolling walker  OT Visit Diagnosis: Unsteadiness on feet (R26.81);Other abnormalities of gait and mobility (R26.89);Muscle weakness (generalized) (M62.81)   Activity Tolerance Patient tolerated treatment well   Patient Left in bed;with call bell/phone within reach;with bed alarm set   Nurse Communication Mobility status        Time: 7026-3785 OT Time Calculation (min): 16 min  Charges: OT General Charges $OT Visit: 1 Visit OT Treatments $Self Care/Home Management : 8-22 mins  Malachy Chamber, OTR/L Acute Rehab Services Office: [phone]   Layla Maw 09/23/2021, 2:12 PM

## 2021-09-23 NOTE — Progress Notes (Signed)
Mobility Specialist Progress Note:   09/23/21 1120  Mobility  Activity Ambulated to bathroom  Level of Assistance Moderate assist, patient does 50-74%  Assistive Device Front wheel walker  Distance Ambulated (ft) 40 ft  Mobility Out of bed to chair with meals;Ambulated with assistance in room  Mobility Response Tolerated well  Mobility performed by Mobility specialist  Bed Position Chair  $Mobility charge 1 Mobility   Pt received in bed, agreed to mobility. Pt showed cognitive deficits today. Required modA to stand from EOB. Ambulated to BR, successful BM. BLE very weak and shaky today. Pt left in chair with RN and NT present.   Nelta Numbers Mobility Specialist  Phone [phone]

## 2021-09-23 NOTE — Discharge Summary (Addendum)
Physician Discharge Summary  Matthew Mahoney NGE:[id number] DOB: [birth year] DOA: 09/17/2021  PCP: [name], MD  Admit date: 09/17/2021 Discharge date: 09/24/2021  Admitted From:Home Disposition:SNF  Recommendations for Outpatient Follow-up:  Follow up with PCP in 1-2 weeks Follow-up with cardiology in few weeks and with GI regarding biopsy results Please obtain BMP/CBC in one week Please follow up on the following pending results:  Home Health:NO  Equipment/Devices: NO  Discharge Condition: Stable Code Status:   Code Status: Full Code Diet recommendation:  Diet Order             Diet Heart Room service appropriate? Yes with Assist; Fluid consistency: Thin  Diet effective now                   Brief/Interim Summary: Matthew Mahoney, 85 y.o. male with PMH of alcohol abuse, A. fib on Eliquis, chronic combined CHF, COPD, CVA, HTN, HLD, prostate cancer, PVD presented with weakness, and recent fall about 2 weeks PTA and has been having bruising on the right leg and not healing and was having pain and trouble walking.  Patient was seen in the ED and admitted for further management. Patient had bradycardia seen by cardiology no further plans and does not need PPM.  Patient is being treated for lower extremity cellulitis and anemia.  Underwent EGD 10/17 with Schatzki's ring, small hiatal hernia, erosive gastritis no active GI bleeding, esophagus dilated.  Patient declined colonoscopy Patient has been doing well overall.  He remains weak deconditioned and will benefit with a skilled nursing facility placement pending insurance approval. D/C date 10/19 and SNF not able to take him on 10/18. Seen this am and no change in his plan, stable for discharge to SNF.  Discharge Diagnoses:   RLE cellulitis MWN:[id number] on Augmentin from 10/15- stop date 10/20.overall improving continue local wound care and dressing change.    Anemia unclear etiology Anemia of chronic disease History  of duodenal ulcers: History of previous duodenal ulcer from EGD done in 2014.Eliquis discontinued by cardiology given the anemia and fall and they are planning for outpatient follow-up in 2 to 4 weeks. Underwent EGD 10/17 with Schatzki's ring, small hiatal hernia, erosive gastritis no active GI bleeding, esophagus dilated.  Patient declined colonoscopy.  GI recommended to  resume Eliquis from 10/18.continue PPI daily and follow-up biopsy results with GI as OP.   Recent Labs  Lab 09/21/21 0457 09/21/21 0942 09/22/21 0101 09/23/21 0153 09/24/21 0153  HGB 9.2* 9.9* 9.3* 9.3* 9.0*  HCT 28.4* 30.1* 28.7* 28.0* 27.5*   AKI likely prerenal in the setting of dehydration: Peaked 1.9 now improved to 1.3 encourage oral hydration.  Net IO Since Admission: 672.09 mL [09/24/21 0957]  Recent Labs  Lab 09/19/21 0536 09/20/21 0624 09/22/21 0101 09/23/21 0153 09/24/21 0153  BUN 41* 38* 31* 34* 37*  CREATININE 1.60* 1.41* 1.35* 1.31* 1.26*   [place] metabolic acidosis: Likely with AKI.    A. fib with slow ventricular response  Right bundle branch block/left anterior fascicular block : Patient had slow ventricular rate/bradycardia bradycardia: Seen by cardiology no plan/no recommendation for PPM, advised to keep off AV node blocking agents.  Eliquis discontinued due to anemia, underwent EGD and GI okay to resume Eliquis 10/18-check CBC in 1 week to make sure hemoglobin remains a stable while on Eliquis.  He needs to check with GI if he can go back on 5 mg bid, for no resumed home dose 2.5 mg bid esp with bleeding issues.  Chronic combined systolic and diastolic congestive heart failure : Not in failure, euvolemic.  Continues Entresto, Lipitor,  amlodipine. Peripheral vascular disease: Continue Lipitor Hypertension: Controlled on low-dose amlodipine.  Weakness/fatigue/generalized deconditioning/ multiple falls: Continue PT OT, will need SNF. TOC working on it. Alcohol use disorder no evidence of  withdrawal continue to monitor.  Continue to  COPD: Not in exacerbation, continue Incruse Ellipta and Breo.  Coughing/choking with pills: No previous history of dysphagia reports x 1 episode-If recurs will get a speech eval.He is going for EGD in am.  Tobacco abuse: Continue nicotine patch  Severe malnutrition augment diet as below  Nutrition Problem: Severe Malnutrition Etiology: chronic illness (CHF, COPD) Signs/Symptoms: severe fat depletion, severe muscle depletion Interventions: Ensure Enlive (each supplement provides 350kcal and 20 grams of protein), Juven, MVI, Liberalize Diet Upper lower buttock pressure injury stage II, POA-see below Pressure Injury 09/17/21 Buttocks Medial;Upper;Lower Stage 2 -  Partial thickness loss of dermis presenting as a shallow open injury with a red, pink wound bed without slough. redness (Active)  09/17/21 1859  [facility]  Location Orientation: Medial;Upper;Lower  Staging: Stage 2 -  Partial thickness loss of dermis presenting as a shallow open injury with a red, pink wound bed without slough.  Wound Description (Comments): redness  Present on Admission: Yes   Consults: Cardiology, gastroenterology  Subjective: Alert awake oriented feels better.  Waiting for rehab. Discharge Exam: Vitals:   09/24/21 0731 09/24/21 0843  BP:  (!) 132/55  Pulse:  61  Resp:  15  Temp:  97.6 F (36.4 C)  SpO2: 97% 95%   General: Pt is alert, awake, not in acute distress Cardiovascular: RRR, S1/S2 +, no rubs, no gallops Respiratory: CTA bilaterally, no wheezing, no rhonchi Abdominal: Soft, NT, ND, bowel sounds + Extremities: no edema, no cyanosis  Discharge Instructions  Discharge Instructions     Discharge instructions   Complete by: As directed    Please call call MD or return to ER for similar or worsening recurring problem that brought you to hospital or if any fever,nausea/vomiting,abdominal pain, uncontrolled pain, chest pain,  shortness of  breath or any other alarming symptoms.  Check CBC  in 5 days to make sure hemoglobin is stable while you are getting blood thinner.  Follow-up with cardiology, follow-up with GI regarding biopsy results.  Please follow-up your doctor as instructed in a week time and call the office for appointment.  Please avoid alcohol, smoking, or any other illicit substance and maintain healthy habits including taking your regular medications as prescribed.  You were cared for by a hospitalist during your hospital stay. If you have any questions about your discharge medications or the care you received while you were in the hospital after you are discharged, you can call the unit and ask to speak with the hospitalist on call if the hospitalist that took care of you is not available.  Once you are discharged, your primary care physician will handle any further medical issues. Please note that NO REFILLS for any discharge medications will be authorized once you are discharged, as it is imperative that you return to your primary care physician (or establish a relationship with a primary care physician if you do not have one) for your aftercare needs so that they can reassess your need for medications and monitor your lab values   Discharge wound care:   Complete by: As directed    Silicone foam dressings to the right knee wound change every 3 days. Assess  under dressings each shift for any acute changes in the wounds.    Wound care  Daily      Comments: Clean RLE wounds with saline, pat dry. Cover open wounds with xeroform gauze (single layer) top with foam dressing. Wrap RLE with kerlix and ACE wrap from toes to patellar notch daily   Increase activity slowly   Complete by: As directed       Allergies as of 09/24/2021   No Active Allergies      Medication List     STOP taking these medications    Aleve PM 220-25 MG Tabs Generic drug: Naproxen Sod-diphenhydrAMINE   carvedilol 12.5 MG  tablet Commonly known as: COREG       TAKE these medications    albuterol 108 (90 Base) MCG/ACT inhaler Commonly known as: VENTOLIN HFA Inhale 1 puff into the lungs every 6 (six) hours as needed for wheezing or shortness of breath.   alendronate 70 MG tablet Commonly known as: FOSAMAX Take 1 tablet by mouth once a week.   amLODipine 2.5 MG tablet Commonly known as: NORVASC Take 2.5 mg by mouth daily.   amoxicillin-clavulanate 875-125 MG tablet Commonly known as: AUGMENTIN Take 1 tablet by mouth every 12 (twelve) hours for 2 days. Stop date 09/25/2021   apixaban 2.5 MG Tabs tablet Commonly known as: Eliquis Take 1 tablet (2.5 mg total) by mouth 2 (two) times daily.   atorvastatin 40 MG tablet Commonly known as: LIPITOR Take 40 mg by mouth every evening.   bisacodyl 5 MG EC tablet Commonly known as: DULCOLAX Take 1 tablet (5 mg total) by mouth daily as needed for moderate constipation.   Cholecalciferol 25 MCG (1000 UT) tablet Take 1 tablet by mouth daily.   docusate sodium 100 MG capsule Commonly known as: COLACE Take 200 mg by mouth daily as needed for mild constipation.   Entresto 49-51 MG Generic drug: sacubitril-valsartan Take 1 tablet by mouth 2 (two) times daily.   feeding supplement Liqd Take 237 mLs by mouth 2 (two) times daily between meals.   nutrition supplement (JUVEN) Pack Take 1 packet by mouth 2 (two) times daily between meals.   Fluticasone-Umeclidin-Vilant 100-62.5-25 MCG/INH Aepb Inhale 1 puff into the lungs daily.   folic acid 1 MG tablet Commonly known as: FOLVITE Take 1 tablet (1 mg total) by mouth daily.   omega-3 acid ethyl esters 1 g capsule Commonly known as: LOVAZA Take 1 g by mouth 2 (two) times daily.   pantoprazole 40 MG tablet Commonly known as: PROTONIX Take 40 mg by mouth daily.   PRESERVISION AREDS PO Take 1 tablet by mouth 2 (two) times daily.   thiamine 100 MG tablet Take 100 mg by mouth daily.   Travoprost  (BAK Free) 0.004 % Soln ophthalmic solution Commonly known as: TRAVATAN Place 1 drop into both eyes at bedtime.               Discharge Care Instructions  (From admission, onward)           Start     Ordered   09/23/21 0000  Discharge wound care:       Comments: Silicone foam dressings to the right knee wound change every 3 days. Assess under dressings each shift for any acute changes in the wounds.    Wound care  Daily      Comments: Clean RLE wounds with saline, pat dry. Cover open wounds with xeroform gauze (single layer) top with foam dressing. Wrap  RLE with kerlix and ACE wrap from toes to patellar notch daily   09/23/21 1008            Follow-up Information     Nahser, Wonda Cheng, MD Follow up.   Specialty: Cardiology Why: The office should contact you within 2-3 business days to arrange follow-up.  If you do not hear from them within this timeframe, please contact the office. Contact information: [address] [place] [address] [phone]                No Active Allergies  The results of significant diagnostics from this hospitalization (including imaging, microbiology, ancillary and laboratory) are listed below for reference.    Microbiology: Recent Results (from the past 240 hour(s))  Resp Panel by RT-PCR (Flu A&B, Covid) Nasopharyngeal Swab     Status: None   Collection Time: 09/17/21 11:50 AM   Specimen: Nasopharyngeal Swab; Nasopharyngeal(NP) swabs in vial transport medium  Result Value Ref Range Status   SARS Coronavirus 2 by RT PCR NEGATIVE NEGATIVE Final    Comment: (NOTE) SARS-CoV-2 target nucleic acids are NOT DETECTED.  The SARS-CoV-2 RNA is generally detectable in upper respiratory specimens during the acute phase of infection. The lowest concentration of SARS-CoV-2 viral copies this assay can detect is 138 copies/mL. A negative result does not preclude SARS-Cov-2 infection and should not be used as the sole  basis for treatment or other patient management decisions. A negative result may occur with  improper specimen collection/handling, submission of specimen other than nasopharyngeal swab, presence of viral mutation(s) within the areas targeted by this assay, and inadequate number of viral copies(<138 copies/mL). A negative result must be combined with clinical observations, patient history, and epidemiological information. The expected result is Negative.  Fact Sheet for Patients:  EntrepreneurPulse.com.au  Fact Sheet for Healthcare Providers:  IncredibleEmployment.be  This test is no t yet approved or cleared by the [place] FDA and  has been authorized for detection and/or diagnosis of SARS-CoV-2 by FDA under an Emergency Use Authorization (EUA). This EUA will remain  in effect (meaning this test can be used) for the duration of the COVID-19 declaration under Section 564(b)(1) of the Act, 21 U.S.C.section 360bbb-3(b)(1), unless the authorization is terminated  or revoked sooner.       Influenza A by PCR NEGATIVE NEGATIVE Final   Influenza B by PCR NEGATIVE NEGATIVE Final    Comment: (NOTE) The Xpert Xpress SARS-CoV-2/FLU/RSV plus assay is intended as an aid in the diagnosis of influenza from Nasopharyngeal swab specimens and should not be used as a sole basis for treatment. Nasal washings and aspirates are unacceptable for Xpert Xpress SARS-CoV-2/FLU/RSV testing.  Fact Sheet for Patients: EntrepreneurPulse.com.au  Fact Sheet for Healthcare Providers: IncredibleEmployment.be  This test is not yet approved or cleared by the [place] FDA and has been authorized for detection and/or diagnosis of SARS-CoV-2 by FDA under an Emergency Use Authorization (EUA). This EUA will remain in effect (meaning this test can be used) for the duration of the COVID-19 declaration under Section 564(b)(1) of the Act,  21 U.S.C. section 360bbb-3(b)(1), unless the authorization is terminated or revoked.  Performed at [facility], [place] [address], [place], [place] [address]   SARS CORONAVIRUS 2 (TAT 6-24 HRS) Nasopharyngeal Nasopharyngeal Swab     Status: None   Collection Time: 09/23/21  6:34 AM   Specimen: Nasopharyngeal Swab  Result Value Ref Range Status   SARS Coronavirus 2 NEGATIVE NEGATIVE  Final    Comment: (NOTE) SARS-CoV-2 target nucleic acids are NOT DETECTED.  The SARS-CoV-2 RNA is generally detectable in upper and lower respiratory specimens during the acute phase of infection. Negative results do not preclude SARS-CoV-2 infection, do not rule out co-infections with other pathogens, and should not be used as the sole basis for treatment or other patient management decisions. Negative results must be combined with clinical observations, patient history, and epidemiological information. The expected result is Negative.  Fact Sheet for Patients: SugarRoll.be  Fact Sheet for Healthcare Providers: https://www.woods-mathews.com/  This test is not yet approved or cleared by the [place] FDA and  has been authorized for detection and/or diagnosis of SARS-CoV-2 by FDA under an Emergency Use Authorization (EUA). This EUA will remain  in effect (meaning this test can be used) for the duration of the COVID-19 declaration under Se ction 564(b)(1) of the Act, 21 U.S.C. section 360bbb-3(b)(1), unless the authorization is terminated or revoked sooner.  Performed at [facility], [place] [address] [place], [place] [address]     Procedures/Studies: DG Chest Port 1 View  Result Date: 09/17/2021 CLINICAL DATA:  Shortness of breath, weakness, hypotension EXAM: PORTABLE CHEST 1 VIEW COMPARISON:  07/22/2021 FINDINGS: Overlying cardiac leads. Mild cardiomegaly, unchanged. Aortic atherosclerosis. Small right and possible trace left pleural  effusion. Minimal streaky bibasilar opacities. No pneumothorax. IMPRESSION: Small right and possible trace left pleural effusion with probable mild bibasilar atelectasis. Electronically Signed   By: Davina Poke D.O.   On: 09/17/2021 12:48   VAS [place] ABI WITH/WO TBI  Result Date: 09/19/2021  LOWER EXTREMITY DOPPLER STUDY Patient Name:  NYKEEM CITRO  Date of Exam:   09/18/2021 Medical Rec #: [id number]        Accession #:    [id number] Date of Birth: 10-03-1936        Patient Gender: M Patient Age:   28 years Exam Location:  [facility] Procedure:      VAS [place] ABI WITH/WO TBI Referring Phys: Anderson Malta YATES --------------------------------------------------------------------------------  Indications: Ulceration, and cellulitis. High Risk         Hypertension, hyperlipidemia, coronary artery disease, prior Factors:          CVA. Other Factors: ETOH abuse. Atrial fibrillation, on Eliquis, CHF, COPD, Prostate                cancer, Aortic stenosis, mitral regurgitation,.  Comparison Study: Prior ABI done 02/11/17 Performing Technologist: Sharion Dove RVS  Examination Guidelines: A complete evaluation includes at minimum, Doppler waveform signals and systolic blood pressure reading at the level of bilateral brachial, anterior tibial, and posterior tibial arteries, when vessel segments are accessible. Bilateral testing is considered an integral part of a complete examination. Photoelectric Plethysmograph (PPG) waveforms and toe systolic pressure readings are included as required and additional duplex testing as needed. Limited examinations for reoccurring indications may be performed as noted.  ABI Findings: +---------+------------------+-----+----------+--------+ Right    Rt Pressure (mmHg)IndexWaveform  Comment  +---------+------------------+-----+----------+--------+ Brachial 127                    triphasic          +---------+------------------+-----+----------+--------+ PTA      130                1.02 monophasic         +---------+------------------+-----+----------+--------+ DP       125  0.98 monophasic         +---------+------------------+-----+----------+--------+ Great Toe34                0.27                    +---------+------------------+-----+----------+--------+ +---------+------------------+-----+----------+-------+ Left     Lt Pressure (mmHg)IndexWaveform  Comment +---------+------------------+-----+----------+-------+ Brachial 124                    triphasic         +---------+------------------+-----+----------+-------+ PTA      137               1.08 monophasic        +---------+------------------+-----+----------+-------+ DP       127               1.00 monophasic        +---------+------------------+-----+----------+-------+ Great Toe51                0.40                   +---------+------------------+-----+----------+-------+ +-------+-----------+-----------+------------+------------+ ABI/TBIToday's ABIToday's TBIPrevious ABIPrevious TBI +-------+-----------+-----------+------------+------------+ Right  1.02       0.27       0.81        0.66         +-------+-----------+-----------+------------+------------+ Left   1.08       0.40       1.05        0.76         +-------+-----------+-----------+------------+------------+ Arterial wall calcification precludes accurate ankle pressures and ABIs. Right ABIs appear increased compared to prior study on 02/11/17. Left ABIs appear dessentially unchanged compared to prior study on 02/11/17. Bilateral TBIs appear decreased compared to prior study on 02/11/17.  Summary: Right: Resting right ankle-brachial index is within normal range. No evidence of significant right lower extremity arterial disease. The right toe-brachial index is abnormal. Although ankle brachial indices are within normal limits (0.95-1.29), arterial Doppler waveforms at the ankle suggest some  component of arterial occlusive disease. Left: Resting left ankle-brachial index is within normal range. No evidence of significant left lower extremity arterial disease. The left toe-brachial index is abnormal. Although ankle brachial indices are within normal limits (0.95-1.29), arterial Doppler waveforms at the ankle suggest some component of arterial occlusive disease.  *See table(s) above for measurements and observations.  Electronically signed by Orlie Pollen on 09/19/2021 at 8:21:26 AM.    Final     Labs: BNP (last 3 results) Recent Labs    09/17/21 1149  BNP 427.0*   Basic Metabolic Panel: Recent Labs  Lab 09/19/21 0536 09/20/21 0624 09/22/21 0101 09/23/21 0153 09/24/21 0153  NA 136 137 136 139 137  K 4.0 4.1 4.5 5.0 3.9  CL 108 106 107 109 106  CO2 19* 21* 22 23 22   GLUCOSE 107* 102* 103* 99 96  BUN 41* 38* 31* 34* 37*  CREATININE 1.60* 1.41* 1.35* 1.31* 1.26*  CALCIUM 8.0* 8.4* 8.9 8.9 8.8*   Liver Function Tests: Recent Labs  Lab 09/17/21 1149  AST 13*  ALT 8  ALKPHOS 67  BILITOT 1.0  PROT 5.5*  ALBUMIN 2.8*   No results for input(s): LIPASE, AMYLASE in the last 168 hours. No results for input(s): AMMONIA in the last 168 hours. CBC: Recent Labs  Lab 09/17/21 1149 09/18/21 0222 09/20/21 0131 09/20/21 1609 09/21/21 0457 09/21/21 0942 09/22/21 0101 09/23/21 0153 09/24/21 0153  WBC 7.4   < >  6.1  --  7.3  --  7.9 8.4 7.3  NEUTROABS 5.0  --   --   --   --   --   --   --   --   HGB 9.4*   < > 8.2*   < > 9.2* 9.9* 9.3* 9.3* 9.0*  HCT 29.3*   < > 25.0*   < > 28.4* 30.1* 28.7* 28.0* 27.5*  MCV 97.7   < > 94.7  --  95.3  --  95.7 94.9 94.8  PLT 121*   < > 117*  --  132*  --  142* 141* 125*   < > = values in this interval not displayed.   Cardiac Enzymes: No results for input(s): CKTOTAL, CKMB, CKMBINDEX, TROPONINI in the last 168 hours. BNP: Invalid input(s): POCBNP CBG: No results for input(s): GLUCAP in the last 168 hours. D-Dimer No results for  input(s): DDIMER in the last 72 hours. Hgb A1c No results for input(s): HGBA1C in the last 72 hours. Lipid Profile No results for input(s): CHOL, HDL, LDLCALC, TRIG, CHOLHDL, LDLDIRECT in the last 72 hours. Thyroid function studies No results for input(s): TSH, T4TOTAL, T3FREE, THYROIDAB in the last 72 hours.  Invalid input(s): FREET3 Anemia work up No results for input(s): VITAMINB12, FOLATE, FERRITIN, TIBC, IRON, RETICCTPCT in the last 72 hours.  Urinalysis    Component Value Date/Time   COLORURINE YELLOW 05/06/2015 0038   APPEARANCEUR CLEAR 05/06/2015 0038   LABSPEC 1.012 05/06/2015 0038   PHURINE 5.5 05/06/2015 0038   GLUCOSEU NEGATIVE 05/06/2015 0038   HGBUR NEGATIVE 05/06/2015 0038   BILIRUBINUR NEGATIVE 05/06/2015 0038   KETONESUR NEGATIVE 05/06/2015 0038   PROTEINUR 100 (A) 05/06/2015 0038   UROBILINOGEN 0.2 05/06/2015 0038   NITRITE NEGATIVE 05/06/2015 0038   LEUKOCYTESUR NEGATIVE 05/06/2015 0038   Sepsis Labs Invalid input(s): PROCALCITONIN,  WBC,  LACTICIDVEN Microbiology Recent Results (from the past 240 hour(s))  Resp Panel by RT-PCR (Flu A&B, Covid) Nasopharyngeal Swab     Status: None   Collection Time: 09/17/21 11:50 AM   Specimen: Nasopharyngeal Swab; Nasopharyngeal(NP) swabs in vial transport medium  Result Value Ref Range Status   SARS Coronavirus 2 by RT PCR NEGATIVE NEGATIVE Final    Comment: (NOTE) SARS-CoV-2 target nucleic acids are NOT DETECTED.  The SARS-CoV-2 RNA is generally detectable in upper respiratory specimens during the acute phase of infection. The lowest concentration of SARS-CoV-2 viral copies this assay can detect is 138 copies/mL. A negative result does not preclude SARS-Cov-2 infection and should not be used as the sole basis for treatment or other patient management decisions. A negative result may occur with  improper specimen collection/handling, submission of specimen other than nasopharyngeal swab, presence of viral  mutation(s) within the areas targeted by this assay, and inadequate number of viral copies(<138 copies/mL). A negative result must be combined with clinical observations, patient history, and epidemiological information. The expected result is Negative.  Fact Sheet for Patients:  EntrepreneurPulse.com.au  Fact Sheet for Healthcare Providers:  IncredibleEmployment.be  This test is no t yet approved or cleared by the [place] FDA and  has been authorized for detection and/or diagnosis of SARS-CoV-2 by FDA under an Emergency Use Authorization (EUA). This EUA will remain  in effect (meaning this test can be used) for the duration of the COVID-19 declaration under Section 564(b)(1) of the Act, 21 U.S.C.section 360bbb-3(b)(1), unless the authorization is terminated  or revoked sooner.       Influenza A by PCR  NEGATIVE NEGATIVE Final   Influenza B by PCR NEGATIVE NEGATIVE Final    Comment: (NOTE) The Xpert Xpress SARS-CoV-2/FLU/RSV plus assay is intended as an aid in the diagnosis of influenza from Nasopharyngeal swab specimens and should not be used as a sole basis for treatment. Nasal washings and aspirates are unacceptable for Xpert Xpress SARS-CoV-2/FLU/RSV testing.  Fact Sheet for Patients: EntrepreneurPulse.com.au  Fact Sheet for Healthcare Providers: [employer]  This test is not yet approved or cleared by the [place] FDA and has been authorized for detection and/or diagnosis of SARS-CoV-2 by FDA under an Emergency Use Authorization (EUA). This EUA will remain in effect (meaning this test can be used) for the duration of the COVID-19 declaration under Section 564(b)(1) of the Act, 21 U.S.C. section 360bbb-3(b)(1), unless the authorization is terminated or revoked.  Performed at [facility], [place] [address], [place], [place] [address]   SARS CORONAVIRUS 2 (TAT 6-24 HRS)  Nasopharyngeal Nasopharyngeal Swab     Status: None   Collection Time: 09/23/21  6:34 AM   Specimen: Nasopharyngeal Swab  Result Value Ref Range Status   SARS Coronavirus 2 NEGATIVE NEGATIVE Final    Comment: (NOTE) SARS-CoV-2 target nucleic acids are NOT DETECTED.  The SARS-CoV-2 RNA is generally detectable in upper and lower respiratory specimens during the acute phase of infection. Negative results do not preclude SARS-CoV-2 infection, do not rule out co-infections with other pathogens, and should not be used as the sole basis for treatment or other patient management decisions. Negative results must be combined with clinical observations, patient history, and epidemiological information. The expected result is Negative.  Fact Sheet for Patients: SugarRoll.be  Fact Sheet for Healthcare Providers: https://www.woods-mathews.com/  This test is not yet approved or cleared by the [place] FDA and  has been authorized for detection and/or diagnosis of SARS-CoV-2 by FDA under an Emergency Use Authorization (EUA). This EUA will remain  in effect (meaning this test can be used) for the duration of the COVID-19 declaration under Se ction 564(b)(1) of the Act, 21 U.S.C. section 360bbb-3(b)(1), unless the authorization is terminated or revoked sooner.  Performed at [facility], [place] [address] [place], [place] [address]      Time coordinating discharge: 35 minutes  SIGNED: Antonieta Pert, MD  [facility] 09/24/2021, 9:57 AM  If 7PM-7AM, please contact night-coverage [email or web address]

## 2021-09-24 ENCOUNTER — Encounter (HOSPITAL_COMMUNITY): Payer: Self-pay | Admitting: Gastroenterology

## 2021-09-24 LAB — BASIC METABOLIC PANEL
Anion gap: 9 (ref 5–15)
BUN: 37 mg/dL — ABNORMAL HIGH (ref 8–23)
CO2: 22 mmol/L (ref 22–32)
Calcium: 8.8 mg/dL — ABNORMAL LOW (ref 8.9–10.3)
Chloride: 106 mmol/L (ref 98–111)
Creatinine, Ser: 1.26 mg/dL — ABNORMAL HIGH (ref 0.61–1.24)
GFR, Estimated: 57 mL/min — ABNORMAL LOW (ref >60)
Glucose, Bld: 96 mg/dL (ref 70–99)
Potassium: 3.9 mmol/L (ref 3.5–5.1)
Sodium: 137 mmol/L (ref 135–145)

## 2021-09-24 LAB — CBC
HCT: 27.5 % — ABNORMAL LOW (ref 39.0–52.0)
Hemoglobin: 9 g/dL — ABNORMAL LOW (ref 13.0–17.0)
MCH: 31 pg (ref 26.0–34.0)
MCHC: 32.7 g/dL (ref 30.0–36.0)
MCV: 94.8 fL (ref 80.0–100.0)
Platelets: 125 10*3/uL — ABNORMAL LOW (ref 150–400)
RBC: 2.9 MIL/uL — ABNORMAL LOW (ref 4.22–5.81)
RDW: 15.1 % (ref 11.5–15.5)
WBC: 7.3 10*3/uL (ref 4.0–10.5)
nRBC: 0 % (ref 0.0–0.2)

## 2021-09-24 NOTE — Care Management Important Message (Signed)
Important Message  Patient Details  Name: Matthew Mahoney MRN: [id number] Date of Birth: Mar 11, 1936   Medicare Important Message Given:  Yes     Shelda Altes 09/24/2021, 10:44 AM

## 2021-09-24 NOTE — TOC Transition Note (Signed)
Transition of Care [facility]) - CM/SW Discharge Note   Patient Details  Name: Matthew Mahoney MRN: [id number] Date of Birth: 11-16-1936  Transition of Care [facility] CM/SW Contact:  Vinie Sill, LCSW Phone Number: 09/24/2021, 10:41 AM   Clinical Narrative:     Patient will Discharge to: [place] Discharge Date: 09/24/2021 Family Notified: Sister,[name] Transport BZ:MCEY  Per MD patient is ready for discharge. RN, patient, and facility notified of discharge. Discharge Summary sent to facility. RN given number for [phone]. Ambulance transport requested for patient.   Clinical Social Worker signing off.  [name], MSW, LCSW Clinical Social Worker     Final next level of care: Skilled Nursing Facility Barriers to Discharge: Barriers Resolved   Patient Goals and CMS Choice Patient states their goals for this hospitalization and ongoing recovery are:: To be able to return home CMS Medicare.gov Compare Post Acute Care list provided to:: Patient Choice offered to / list presented to : Patient  Discharge Placement              Patient chooses bed at: [facility] Patient to be transferred to facility by: [facility] Name of family member notified: sister,[name] Patient and family notified of of transfer: 09/24/21  Discharge Plan and Services In-house Referral: Clinical Social Work                                   Social Determinants of Health ([place]) Interventions     Readmission Risk Interventions No flowsheet data found.

## 2021-09-24 NOTE — Progress Notes (Signed)
Patient discharge instructions printed and given to PTAR. [place] for report with no answer. PIV removed. Telemetry box removed. [facility] picked up patient for transport to [facility]. Patients sister notified.  Daymon Larsen, RN

## 2021-09-24 NOTE — Progress Notes (Signed)
Mobility Specialist Progress Note:   09/24/21 1120  Mobility  Activity Refused mobility   Pt refused mobility, stating he did not want to get up and that he was too comfortable. Displayed more confusion today. Pt left with call bell in reach.  Nelta Numbers Mobility Specialist  Phone [phone]

## 2021-09-24 NOTE — Progress Notes (Signed)
  D/C date 10/19 and SNF not able to take him on 10/18. Seen this am and no change in his plan. He is AA0x3, no complaints. He is stable for discharge to SNF.

## 2021-09-24 NOTE — Progress Notes (Addendum)
Attempted to call report on patient to [facility] with no answer at [phone]. Will try again.  *attempted X 3  Daymon Larsen, RN

## 2021-10-10 NOTE — Progress Notes (Deleted)
Cardiology Office Note    Date:  10/10/2021   ID:  Matthew Mahoney, DOB [birth year], MRN [id number]  PCP:  [name], MD  Cardiologist:  Mertie Moores, MD  Electrophysiologist:  None   Chief Complaint: ***  History of Present Illness:   Matthew Mahoney is a 85 y.o. male with history of CAD s/p remote stents (including at [facility] to LAD, date unavailable), chronic combined CHF (remote EF 20-25% in 2014, improved to 50-55% in 2016), permanent atrial fibrillation, bradycardia,  conduction disorder with bifascicular block, CKD3b, CVA, prior GIB with shock, prostate CA, LE edema, alcohol abuse, GERD, NSVT, COPD, diverticulitis, prior ascites, suspected PAD (managed conservatively), mild carotid disease (1-39% BICA 2014), thrombocytopenia who presents for follow-up.   He has followed with Dr. Acie Fredrickson and Dr. Gwenlyn Found previously. Last echo was in 2016 showed EF 50-55%, mild LVH, insufficient for diastolic dysfunction, moderate LAE, mild RAE, mild TR, mildly increased PASP. He was previously suspected to have possible PAD by abnormal ABIs but managed conservatively given CKD and lack of claudication. He was recently admitted in 09/2021 with generalized weakness felt multifactorial in setting of deconditioning, RLE cellulitis, ETOH abuse, ambulatory dysfunction, anemia, and bradycardia. He was bradycardic in the 40s so carvedilol was discontinued. ABIs showed right ABI Wnl (R TBI is abnormal), L ABI wnl (TBI abnormal). He underwent EGD 10/17 with Schatzki's ring, small hiatal hernia, erosive gastritis no active GI bleeding, esophagus dilated. Patient declined colonoscopy. GI recommended to  resume Eliquis and continue PPI with OP GI f/u.  Permanent atrial fib, recently with bradycardia and known bifascicular block CAD s/p prior PCI Chronic combined CHF PAD, prior mild carotid disease 2014 CKD stage 3b  Labwork independently reviewed: 09/2021 K 3.9, Cr 1.26, Hgb 9.0, plt 125, TSH wnl,  AST/ALT wnl, albumin 2.8   Past Medical History:  Diagnosis Date   Alcohol abuse 01/21/2013   Aortic stenosis    Ascites    HISTORY   Atrial fibrillation ([place]) 09/23/2013   Choledocholithiasis    Chronic combined systolic and diastolic congestive heart failure ([place]) 05/06/2015   Conduction disorder of the heart 09/25/2013   COPD (chronic obstructive pulmonary disease) (H[facility])    Coronary arteriosclerosis    CVA (cerebral infarction) 09/19/2013   GERD (gastroesophageal reflux disease) 10/11/2013   GI bleed    a. 03/2013 a/w hemorrhagic shock - EGD on 03/1013, which revealed active bleeding from proximal duodenum, with complete hemostasis following epinephrine injection therapy. At least 2 duodenal ulcers identified.   Hemorrhagic shock ([place]) 03/16/2013   History of colonic diverticulitis    Hyperkalemia    Hyperlipidemia    Hypertension    Hypocalcemia    Ichthyosis    Lymphadenopathy    Mitral regurgitation    Nonsustained ventricular tachycardia 05/09/2015   Olecranon bursitis    Onychomycosis    Prostate cancer ([place])    Status post lung surgery 12/07/2001   BENIGHN LESION ON RIGHT LUNG REMOVED   Thrombocytopenia ([place]) 09/25/2013   UNSPECIFIED PERIPHERAL VASCULAR DISEASE 02/18/2011   Qualifier: Diagnosis of  By: Terald Sleeper     Vitamin D deficiency     Past Surgical History:  Procedure Laterality Date   BIOPSY  09/22/2021   Procedure: BIOPSY;  Surgeon: Jackquline Denmark, MD;  Location: [facility] ENDOSCOPY;  Service: Endoscopy;;   COLONOSCOPY     ESOPHAGEAL DILATION  09/22/2021   Procedure: ESOPHAGEAL DILATION;  Surgeon: Jackquline Denmark, MD;  Location: [facility] ENDOSCOPY;  Service: Endoscopy;;   ESOPHAGOGASTRODUODENOSCOPY  N/A 03/16/2013   Procedure: ESOPHAGOGASTRODUODENOSCOPY (EGD);  Surgeon: Cleotis Nipper, MD;  Location: [facility] ENDOSCOPY;  Service: Endoscopy;  Laterality: N/A;  will probably want to do at bedside in ICU---bed placement pending   ESOPHAGOGASTRODUODENOSCOPY (EGD) WITH  PROPOFOL N/A 09/22/2021   Procedure: ESOPHAGOGASTRODUODENOSCOPY (EGD) WITH PROPOFOL;  Surgeon: Jackquline Denmark, MD;  Location: [facility] ENDOSCOPY;  Service: Endoscopy;  Laterality: N/A;   FEMUR IM NAIL Left 01/21/2013   Procedure: INTRAMEDULLARY (IM) NAIL FEMORAL;  Surgeon: Hessie Dibble, MD;  Location: [facility] ORS;  Service: Orthopedics;  Laterality: Left;   HEMORRHOID SURGERY  12/07/1998   HIP PINNING,CANNULATED Right 05/07/2015   Procedure: CANNULATED HIP PINNING;  Surgeon: Phylliss Bob, MD;  Location: [facility] ORS;  Service: Orthopedics;  Laterality: Right;   LOOP RECORDER IMPLANT N/A 09/22/2013   Procedure: LOOP RECORDER IMPLANT;  Surgeon: Coralyn Mark, MD;  Location: [facility] CATH LAB;  Service: Cardiovascular;  Laterality: N/A;   LUNG SURGERY  12/07/2001   BENIGN LESION ON RIGHT LUNG REMOVED    Current Medications: No outpatient medications have been marked as taking for the 10/16/21 encounter (Appointment) with Charlie Pitter, PA-C.   ***   Allergies:   Patient has no active allergies.   Social History   Socioeconomic History   Marital status: Widowed    Spouse name: Not on file   Number of children: 0   Years of education: 12th   Highest education level: Not on file  Occupational History   Occupation: retired  Tobacco Use   Smoking status: Every Day    Packs/day: 1.50    Years: 65.00    Pack years: 97.50    Types: Cigarettes   Smokeless tobacco: Never  Substance and Sexual Activity   Alcohol use: Yes    Comment: 1 1/2 fifths a week, 4 shots/day   Drug use: No   Sexual activity: Not Currently  Other Topics Concern   Not on file  Social History Narrative   Not on file   Social Determinants of Health   Financial Resource Strain: Not on file  Food Insecurity: Not on file  Transportation Needs: Not on file  Physical Activity: Not on file  Stress: Not on file  Social Connections: Not on file     Family History:  The patient's ***family history includes Hypertension in his brother,  brother, brother, brother, brother, sister, and sister; Hypertension (age of onset: 46) in his father; Hypertension (age of onset: 60) in his mother.  ROS:   Please see the history of present illness. Otherwise, review of systems is positive for ***.  All other systems are reviewed and otherwise negative.    EKGs/Labs/Other Studies Reviewed:    Studies reviewed are outlined and summarized above. Reports included below if pertinent.  2D echo 2016 - Left ventricle: The cavity size was normal. There was mild    concentric hypertrophy. Systolic function was normal. The    estimated ejection fraction was in the range of 50% to 55%. Wall    motion was normal; there were no regional wall motion    abnormalities. The study was not technically sufficient to allow    evaluation of LV diastolic dysfunction due to atrial    fibrillation.  - Aortic valve: Trileaflet; mildly thickened, mildly calcified    leaflets. There was no regurgitation.  - Aortic root: The aortic root was normal in size.  - Mitral valve: There was no regurgitation.  - Left atrium: The atrium was moderately dilated.  -  Right ventricle: Systolic function was normal.  - Right atrium: The atrium was mildly dilated.  - Tricuspid valve: There was mild regurgitation.  - Pulmonic valve: There was no regurgitation.  - Pulmonary arteries: Systolic pressure was mildly increased. PA    peak pressure: 43 mm Hg (S).  - Inferior vena cava: The vessel was normal in size.  - Pericardium, extracardiac: There was no pericardial effusion.     EKG:  EKG is ordered today, personally reviewed, demonstrating ***  Recent Labs: 09/17/2021: ALT 8; B Natriuretic Peptide 353.6; TSH 2.224 09/24/2021: BUN 37; Creatinine, Ser 1.26; Hemoglobin 9.0; Platelets 125; Potassium 3.9; Sodium 137  Recent Lipid Panel    Component Value Date/Time   CHOL 107 (L) 02/27/2016 0908   TRIG 115 02/27/2016 0908   HDL 41 02/27/2016 0908   CHOLHDL 2.6 02/27/2016  0908   VLDL 23 02/27/2016 0908   LDLCALC 43 02/27/2016 0908    PHYSICAL EXAM:    VS:  There were no vitals taken for this visit.  BMI: There is no height or weight on file to calculate BMI.  GEN: Well nourished, well developed male in no acute distress HEENT: normocephalic, atraumatic Neck: no JVD, carotid bruits, or masses Cardiac: ***RRR; no murmurs, rubs, or gallops, no edema  Respiratory:  clear to auscultation bilaterally, normal work of breathing GI: soft, nontender, nondistended, + BS MS: no deformity or atrophy Skin: warm and dry, no rash Neuro:  Alert and Oriented x 3, Strength and sensation are intact, follows commands Psych: euthymic mood, full affect  Wt Readings from Last 3 Encounters:  09/17/21 176 lb 5.9 oz (80 kg)  11/21/18 195 lb (88.5 kg)  02/26/17 204 lb 3.2 oz (92.6 kg)     ASSESSMENT & PLAN:   ***     Disposition: F/u with ***   Medication Adjustments/Labs and Tests Ordered: Current medicines are reviewed at length with the patient today.  Concerns regarding medicines are outlined above. Medication changes, Labs and Tests ordered today are summarized above and listed in the Patient Instructions accessible in Encounters.   Signed, Charlie Pitter, PA-C  10/10/2021 1:12 PM    [facility] [place], [place], [place]  [address] Phone: [phone]; Fax: [phone]

## 2021-10-13 ENCOUNTER — Encounter: Payer: Self-pay | Admitting: Physician Assistant

## 2021-10-16 ENCOUNTER — Ambulatory Visit: Payer: Medicare Other | Admitting: Physician Assistant

## 2021-10-16 DIAGNOSIS — R001 Bradycardia, unspecified: Secondary | ICD-10-CM

## 2021-10-16 DIAGNOSIS — N1832 Chronic kidney disease, stage 3b: Secondary | ICD-10-CM

## 2021-10-16 DIAGNOSIS — I2510 Atherosclerotic heart disease of native coronary artery without angina pectoris: Secondary | ICD-10-CM

## 2021-10-16 DIAGNOSIS — I739 Peripheral vascular disease, unspecified: Secondary | ICD-10-CM

## 2021-10-16 DIAGNOSIS — I5042 Chronic combined systolic (congestive) and diastolic (congestive) heart failure: Secondary | ICD-10-CM

## 2021-10-16 DIAGNOSIS — I4821 Permanent atrial fibrillation: Secondary | ICD-10-CM

## 2021-10-16 DIAGNOSIS — I452 Bifascicular block: Secondary | ICD-10-CM

## 2021-10-17 ENCOUNTER — Inpatient Hospital Stay (HOSPITAL_COMMUNITY)
Admission: EM | Admit: 2021-10-17 | Discharge: 2021-11-06 | DRG: 371 | Disposition: E | Payer: Medicare Other | Source: Skilled Nursing Facility | Attending: Internal Medicine | Admitting: Internal Medicine

## 2021-10-17 ENCOUNTER — Inpatient Hospital Stay (HOSPITAL_COMMUNITY): Payer: Medicare Other

## 2021-10-17 ENCOUNTER — Emergency Department (HOSPITAL_COMMUNITY): Payer: Medicare Other

## 2021-10-17 DIAGNOSIS — N179 Acute kidney failure, unspecified: Secondary | ICD-10-CM | POA: Diagnosis present

## 2021-10-17 DIAGNOSIS — Z6825 Body mass index (BMI) 25.0-25.9, adult: Secondary | ICD-10-CM

## 2021-10-17 DIAGNOSIS — L89159 Pressure ulcer of sacral region, unspecified stage: Secondary | ICD-10-CM | POA: Diagnosis present

## 2021-10-17 DIAGNOSIS — E876 Hypokalemia: Secondary | ICD-10-CM | POA: Diagnosis not present

## 2021-10-17 DIAGNOSIS — R0609 Other forms of dyspnea: Secondary | ICD-10-CM | POA: Diagnosis not present

## 2021-10-17 DIAGNOSIS — I5043 Acute on chronic combined systolic (congestive) and diastolic (congestive) heart failure: Secondary | ICD-10-CM | POA: Diagnosis present

## 2021-10-17 DIAGNOSIS — J9601 Acute respiratory failure with hypoxia: Secondary | ICD-10-CM | POA: Diagnosis not present

## 2021-10-17 DIAGNOSIS — F0390 Unspecified dementia without behavioral disturbance: Secondary | ICD-10-CM | POA: Diagnosis present

## 2021-10-17 DIAGNOSIS — F1011 Alcohol abuse, in remission: Secondary | ICD-10-CM | POA: Diagnosis present

## 2021-10-17 DIAGNOSIS — D631 Anemia in chronic kidney disease: Secondary | ICD-10-CM | POA: Diagnosis present

## 2021-10-17 DIAGNOSIS — E778 Other disorders of glycoprotein metabolism: Secondary | ICD-10-CM | POA: Diagnosis present

## 2021-10-17 DIAGNOSIS — L8990 Pressure ulcer of unspecified site, unspecified stage: Secondary | ICD-10-CM | POA: Diagnosis not present

## 2021-10-17 DIAGNOSIS — K2960 Other gastritis without bleeding: Secondary | ICD-10-CM | POA: Diagnosis present

## 2021-10-17 DIAGNOSIS — N1832 Chronic kidney disease, stage 3b: Secondary | ICD-10-CM | POA: Diagnosis present

## 2021-10-17 DIAGNOSIS — J90 Pleural effusion, not elsewhere classified: Secondary | ICD-10-CM | POA: Diagnosis not present

## 2021-10-17 DIAGNOSIS — I4821 Permanent atrial fibrillation: Secondary | ICD-10-CM | POA: Diagnosis present

## 2021-10-17 DIAGNOSIS — Z20822 Contact with and (suspected) exposure to covid-19: Secondary | ICD-10-CM | POA: Diagnosis present

## 2021-10-17 DIAGNOSIS — L89152 Pressure ulcer of sacral region, stage 2: Secondary | ICD-10-CM | POA: Diagnosis present

## 2021-10-17 DIAGNOSIS — Z7901 Long term (current) use of anticoagulants: Secondary | ICD-10-CM

## 2021-10-17 DIAGNOSIS — Z720 Tobacco use: Secondary | ICD-10-CM | POA: Diagnosis not present

## 2021-10-17 DIAGNOSIS — Z79899 Other long term (current) drug therapy: Secondary | ICD-10-CM

## 2021-10-17 DIAGNOSIS — R1310 Dysphagia, unspecified: Secondary | ICD-10-CM | POA: Diagnosis present

## 2021-10-17 DIAGNOSIS — I130 Hypertensive heart and chronic kidney disease with heart failure and stage 1 through stage 4 chronic kidney disease, or unspecified chronic kidney disease: Secondary | ICD-10-CM | POA: Diagnosis present

## 2021-10-17 DIAGNOSIS — I739 Peripheral vascular disease, unspecified: Secondary | ICD-10-CM | POA: Diagnosis present

## 2021-10-17 DIAGNOSIS — K269 Duodenal ulcer, unspecified as acute or chronic, without hemorrhage or perforation: Secondary | ICD-10-CM | POA: Diagnosis present

## 2021-10-17 DIAGNOSIS — I472 Ventricular tachycardia, unspecified: Secondary | ICD-10-CM | POA: Diagnosis not present

## 2021-10-17 DIAGNOSIS — I4510 Unspecified right bundle-branch block: Secondary | ICD-10-CM | POA: Diagnosis present

## 2021-10-17 DIAGNOSIS — Z66 Do not resuscitate: Secondary | ICD-10-CM | POA: Diagnosis present

## 2021-10-17 DIAGNOSIS — Z515 Encounter for palliative care: Secondary | ICD-10-CM

## 2021-10-17 DIAGNOSIS — J690 Pneumonitis due to inhalation of food and vomit: Secondary | ICD-10-CM | POA: Diagnosis not present

## 2021-10-17 DIAGNOSIS — Z8679 Personal history of other diseases of the circulatory system: Secondary | ICD-10-CM | POA: Diagnosis not present

## 2021-10-17 DIAGNOSIS — A0472 Enterocolitis due to Clostridium difficile, not specified as recurrent: Secondary | ICD-10-CM | POA: Diagnosis present

## 2021-10-17 DIAGNOSIS — K219 Gastro-esophageal reflux disease without esophagitis: Secondary | ICD-10-CM | POA: Diagnosis present

## 2021-10-17 DIAGNOSIS — R571 Hypovolemic shock: Secondary | ICD-10-CM | POA: Diagnosis present

## 2021-10-17 DIAGNOSIS — Z7983 Long term (current) use of bisphosphonates: Secondary | ICD-10-CM

## 2021-10-17 DIAGNOSIS — R627 Adult failure to thrive: Secondary | ICD-10-CM | POA: Diagnosis present

## 2021-10-17 DIAGNOSIS — Z8249 Family history of ischemic heart disease and other diseases of the circulatory system: Secondary | ICD-10-CM

## 2021-10-17 DIAGNOSIS — J449 Chronic obstructive pulmonary disease, unspecified: Secondary | ICD-10-CM | POA: Diagnosis present

## 2021-10-17 DIAGNOSIS — L89616 Pressure-induced deep tissue damage of right heel: Secondary | ICD-10-CM | POA: Diagnosis present

## 2021-10-17 DIAGNOSIS — I959 Hypotension, unspecified: Secondary | ICD-10-CM | POA: Diagnosis not present

## 2021-10-17 DIAGNOSIS — E43 Unspecified severe protein-calorie malnutrition: Secondary | ICD-10-CM | POA: Diagnosis present

## 2021-10-17 DIAGNOSIS — Z7189 Other specified counseling: Secondary | ICD-10-CM | POA: Diagnosis not present

## 2021-10-17 DIAGNOSIS — F17210 Nicotine dependence, cigarettes, uncomplicated: Secondary | ICD-10-CM | POA: Diagnosis present

## 2021-10-17 DIAGNOSIS — S81801D Unspecified open wound, right lower leg, subsequent encounter: Secondary | ICD-10-CM

## 2021-10-17 DIAGNOSIS — B351 Tinea unguium: Secondary | ICD-10-CM | POA: Diagnosis present

## 2021-10-17 DIAGNOSIS — K449 Diaphragmatic hernia without obstruction or gangrene: Secondary | ICD-10-CM | POA: Diagnosis present

## 2021-10-17 DIAGNOSIS — D72829 Elevated white blood cell count, unspecified: Secondary | ICD-10-CM | POA: Diagnosis not present

## 2021-10-17 DIAGNOSIS — Z8546 Personal history of malignant neoplasm of prostate: Secondary | ICD-10-CM

## 2021-10-17 DIAGNOSIS — E860 Dehydration: Secondary | ICD-10-CM | POA: Diagnosis present

## 2021-10-17 DIAGNOSIS — I2510 Atherosclerotic heart disease of native coronary artery without angina pectoris: Secondary | ICD-10-CM | POA: Diagnosis present

## 2021-10-17 DIAGNOSIS — L89626 Pressure-induced deep tissue damage of left heel: Secondary | ICD-10-CM | POA: Diagnosis present

## 2021-10-17 DIAGNOSIS — Z789 Other specified health status: Secondary | ICD-10-CM | POA: Diagnosis not present

## 2021-10-17 DIAGNOSIS — J9811 Atelectasis: Secondary | ICD-10-CM | POA: Diagnosis present

## 2021-10-17 DIAGNOSIS — R0602 Shortness of breath: Secondary | ICD-10-CM

## 2021-10-17 DIAGNOSIS — Z8673 Personal history of transient ischemic attack (TIA), and cerebral infarction without residual deficits: Secondary | ICD-10-CM

## 2021-10-17 DIAGNOSIS — R54 Age-related physical debility: Secondary | ICD-10-CM | POA: Diagnosis present

## 2021-10-17 DIAGNOSIS — Z8711 Personal history of peptic ulcer disease: Secondary | ICD-10-CM

## 2021-10-17 DIAGNOSIS — E8809 Other disorders of plasma-protein metabolism, not elsewhere classified: Secondary | ICD-10-CM | POA: Diagnosis present

## 2021-10-17 DIAGNOSIS — E785 Hyperlipidemia, unspecified: Secondary | ICD-10-CM | POA: Diagnosis present

## 2021-10-17 DIAGNOSIS — K222 Esophageal obstruction: Secondary | ICD-10-CM | POA: Diagnosis present

## 2021-10-17 LAB — COMPREHENSIVE METABOLIC PANEL
ALT: 9 U/L (ref 0–44)
AST: 10 U/L — ABNORMAL LOW (ref 15–41)
Albumin: 1.8 g/dL — ABNORMAL LOW (ref 3.5–5.0)
Alkaline Phosphatase: 78 U/L (ref 38–126)
Anion gap: 13 (ref 5–15)
BUN: 45 mg/dL — ABNORMAL HIGH (ref 8–23)
CO2: 20 mmol/L — ABNORMAL LOW (ref 22–32)
Calcium: 7.8 mg/dL — ABNORMAL LOW (ref 8.9–10.3)
Chloride: 107 mmol/L (ref 98–111)
Creatinine, Ser: 2.17 mg/dL — ABNORMAL HIGH (ref 0.61–1.24)
GFR, Estimated: 29 mL/min — ABNORMAL LOW (ref >60)
Glucose, Bld: 87 mg/dL (ref 70–99)
Potassium: 2.4 mmol/L — CL (ref 3.5–5.1)
Sodium: 140 mmol/L (ref 135–145)
Total Bilirubin: 1 mg/dL (ref 0.3–1.2)
Total Protein: 5 g/dL — ABNORMAL LOW (ref 6.5–8.1)

## 2021-10-17 LAB — CBC WITH DIFFERENTIAL/PLATELET
Abs Immature Granulocytes: 0.15 K/uL — ABNORMAL HIGH (ref 0.00–0.07)
Basophils Absolute: 0.1 K/uL (ref 0.0–0.1)
Basophils Relative: 0 %
Eosinophils Absolute: 0.3 K/uL (ref 0.0–0.5)
Eosinophils Relative: 2 %
HCT: 33 % — ABNORMAL LOW (ref 39.0–52.0)
Hemoglobin: 10.5 g/dL — ABNORMAL LOW (ref 13.0–17.0)
Immature Granulocytes: 1 %
Lymphocytes Relative: 11 %
Lymphs Abs: 2.1 K/uL (ref 0.7–4.0)
MCH: 29.8 pg (ref 26.0–34.0)
MCHC: 31.8 g/dL (ref 30.0–36.0)
MCV: 93.8 fL (ref 80.0–100.0)
Monocytes Absolute: 0.7 K/uL (ref 0.1–1.0)
Monocytes Relative: 4 %
Neutro Abs: 15.7 K/uL — ABNORMAL HIGH (ref 1.7–7.7)
Neutrophils Relative %: 82 %
Platelets: 246 K/uL (ref 150–400)
RBC: 3.52 MIL/uL — ABNORMAL LOW (ref 4.22–5.81)
RDW: 14.2 % (ref 11.5–15.5)
WBC: 19.1 K/uL — ABNORMAL HIGH (ref 4.0–10.5)
nRBC: 0 % (ref 0.0–0.2)

## 2021-10-17 LAB — C DIFFICILE QUICK SCREEN W PCR REFLEX
C Diff antigen: POSITIVE — AB
C Diff toxin: NEGATIVE

## 2021-10-17 LAB — PROTIME-INR
INR: 1.3 — ABNORMAL HIGH (ref 0.8–1.2)
Prothrombin Time: 16 seconds — ABNORMAL HIGH (ref 11.4–15.2)

## 2021-10-17 LAB — RESP PANEL BY RT-PCR (FLU A&B, COVID) ARPGX2
Influenza A by PCR: NEGATIVE
Influenza B by PCR: NEGATIVE
SARS Coronavirus 2 by RT PCR: NEGATIVE

## 2021-10-17 LAB — LACTIC ACID, PLASMA: Lactic Acid, Venous: 1.1 mmol/L (ref 0.5–1.9)

## 2021-10-17 LAB — APTT: aPTT: 35 seconds (ref 24–36)

## 2021-10-17 LAB — MAGNESIUM: Magnesium: 1.1 mg/dL — ABNORMAL LOW (ref 1.7–2.4)

## 2021-10-17 LAB — PREALBUMIN: Prealbumin: <5 mg/dL — ABNORMAL LOW (ref 18–38)

## 2021-10-17 LAB — CLOSTRIDIUM DIFFICILE BY PCR, REFLEXED: Toxigenic C. Difficile by PCR: POSITIVE — AB

## 2021-10-17 MED ORDER — SODIUM CHLORIDE 0.9 % IV SOLN: Freq: Once | INTRAVENOUS | Status: AC

## 2021-10-17 MED ORDER — PANTOPRAZOLE SODIUM 40 MG PO TBEC
40.0000 mg | DELAYED_RELEASE_TABLET | Freq: Every day | ORAL | Status: DC
  Administered 2021-10-17 – 2021-10-24 (×7): 40 mg via ORAL
  Filled 2021-10-17 (×8): qty 1

## 2021-10-17 MED ORDER — ENSURE ENLIVE PO LIQD
237.0000 mL | Freq: Two times a day (BID) | ORAL | Status: DC
  Administered 2021-10-19 – 2021-10-23 (×7): 237 mL via ORAL

## 2021-10-17 MED ORDER — ALBUMIN HUMAN 25 % IV SOLN
25.0000 g | INTRAVENOUS | Status: AC
  Administered 2021-10-17: 25 g via INTRAVENOUS
  Filled 2021-10-17: qty 100

## 2021-10-17 MED ORDER — POTASSIUM CHLORIDE 10 MEQ/100ML IV SOLN
10.0000 meq | INTRAVENOUS | Status: AC
  Administered 2021-10-17 (×3): 10 meq via INTRAVENOUS
  Filled 2021-10-17 (×3): qty 100

## 2021-10-17 MED ORDER — SODIUM CHLORIDE 0.9 % IV SOLN
INTRAVENOUS | Status: DC
  Administered 2021-10-17: 100 mL/h via INTRAVENOUS

## 2021-10-17 MED ORDER — LACTATED RINGERS IV BOLUS (SEPSIS)
1000.0000 mL | Freq: Once | INTRAVENOUS | Status: AC
  Administered 2021-10-17: 1000 mL via INTRAVENOUS

## 2021-10-17 MED ORDER — LACTATED RINGERS IV BOLUS
1000.0000 mL | Freq: Once | INTRAVENOUS | Status: AC
  Administered 2021-10-17: 1000 mL via INTRAVENOUS

## 2021-10-17 MED ORDER — SODIUM CHLORIDE 0.9% FLUSH
3.0000 mL | Freq: Two times a day (BID) | INTRAVENOUS | Status: DC
  Administered 2021-10-17 – 2021-10-24 (×12): 3 mL via INTRAVENOUS

## 2021-10-17 MED ORDER — PROSIGHT PO TABS
1.0000 | ORAL_TABLET | Freq: Every day | ORAL | Status: DC
Start: 2021-10-17 — End: 2021-10-23
  Administered 2021-10-17 – 2021-10-23 (×6): 1 via ORAL
  Filled 2021-10-17 (×7): qty 1

## 2021-10-17 MED ORDER — LATANOPROST 0.005 % OP SOLN
1.0000 [drp] | Freq: Every day | OPHTHALMIC | Status: DC
  Administered 2021-10-18 – 2021-10-23 (×6): 1 [drp] via OPHTHALMIC
  Filled 2021-10-17: qty 2.5

## 2021-10-17 MED ORDER — OCUVITE-LUTEIN PO CAPS
1.0000 | ORAL_CAPSULE | Freq: Every day | ORAL | Status: DC
  Filled 2021-10-17: qty 1

## 2021-10-17 MED ORDER — MAGNESIUM SULFATE 2 GM/50ML IV SOLN
2.0000 g | Freq: Once | INTRAVENOUS | Status: AC
  Administered 2021-10-17: 2 g via INTRAVENOUS
  Filled 2021-10-17: qty 50

## 2021-10-17 MED ORDER — VANCOMYCIN HCL 125 MG PO CAPS
125.0000 mg | ORAL_CAPSULE | Freq: Four times a day (QID) | ORAL | Status: DC
  Administered 2021-10-17 – 2021-10-23 (×22): 125 mg via ORAL
  Filled 2021-10-17 (×27): qty 1

## 2021-10-17 MED ORDER — SODIUM CHLORIDE 0.9 % IV BOLUS
1000.0000 mL | Freq: Once | INTRAVENOUS | Status: AC
  Administered 2021-10-17: 1000 mL via INTRAVENOUS

## 2021-10-17 MED ORDER — GUAIFENESIN ER 600 MG PO TB12
600.0000 mg | ORAL_TABLET | Freq: Two times a day (BID) | ORAL | Status: DC
  Administered 2021-10-17 – 2021-10-23 (×10): 600 mg via ORAL
  Filled 2021-10-17 (×12): qty 1

## 2021-10-17 MED ORDER — ATORVASTATIN CALCIUM 40 MG PO TABS
40.0000 mg | ORAL_TABLET | Freq: Every evening | ORAL | Status: DC
  Administered 2021-10-17 – 2021-10-22 (×6): 40 mg via ORAL
  Filled 2021-10-17 (×7): qty 1

## 2021-10-17 MED ORDER — UMECLIDINIUM BROMIDE 62.5 MCG/ACT IN AEPB
1.0000 | INHALATION_SPRAY | Freq: Every day | RESPIRATORY_TRACT | Status: DC
  Administered 2021-10-18 – 2021-10-22 (×4): 1 via RESPIRATORY_TRACT
  Filled 2021-10-17: qty 7

## 2021-10-17 MED ORDER — JUVEN PO PACK
1.0000 | PACK | Freq: Two times a day (BID) | ORAL | Status: DC
  Administered 2021-10-19 – 2021-10-23 (×9): 1 via ORAL
  Filled 2021-10-17 (×10): qty 1

## 2021-10-17 MED ORDER — FLUTICASONE FUROATE-VILANTEROL 100-25 MCG/ACT IN AEPB
1.0000 | INHALATION_SPRAY | Freq: Every day | RESPIRATORY_TRACT | Status: DC
  Administered 2021-10-18 – 2021-10-22 (×4): 1 via RESPIRATORY_TRACT
  Filled 2021-10-17: qty 28

## 2021-10-17 MED ORDER — POTASSIUM CHLORIDE CRYS ER 20 MEQ PO TBCR
60.0000 meq | EXTENDED_RELEASE_TABLET | ORAL | Status: AC
  Administered 2021-10-17: 60 meq via ORAL
  Filled 2021-10-17: qty 3

## 2021-10-17 MED ORDER — THIAMINE HCL 100 MG PO TABS
100.0000 mg | ORAL_TABLET | Freq: Every day | ORAL | Status: DC
  Administered 2021-10-17 – 2021-10-23 (×6): 100 mg via ORAL
  Filled 2021-10-17 (×7): qty 1

## 2021-10-17 MED ORDER — APIXABAN 2.5 MG PO TABS
2.5000 mg | ORAL_TABLET | Freq: Two times a day (BID) | ORAL | Status: DC
  Administered 2021-10-17 – 2021-10-21 (×7): 2.5 mg via ORAL
  Filled 2021-10-17 (×8): qty 1

## 2021-10-17 MED ORDER — SACCHAROMYCES BOULARDII 250 MG PO CAPS
250.0000 mg | ORAL_CAPSULE | Freq: Two times a day (BID) | ORAL | Status: DC
  Administered 2021-10-17 – 2021-10-24 (×13): 250 mg via ORAL
  Filled 2021-10-17 (×16): qty 1

## 2021-10-17 MED ORDER — PRESERVISION AREDS PO CAPS: ORAL_CAPSULE | Freq: Two times a day (BID) | ORAL | Status: DC

## 2021-10-17 MED ORDER — FLUTICASONE-UMECLIDIN-VILANT 100-62.5-25 MCG/INH IN AEPB: 1.0000 | INHALATION_SPRAY | Freq: Every day | RESPIRATORY_TRACT | Status: DC

## 2021-10-17 NOTE — ED Provider Notes (Signed)
[facility] EMERGENCY DEPARTMENT Provider Note   CSN: [id number] Arrival date & time: 10/26/2021  0805     History Chief Complaint  Patient presents with   Sepsis    Matthew Mahoney is a 85 y.o. male.  HPI Level 5 caveat to  patient has a history of dementia.  However patient is awake and alert and appears to be answering her questions appropriately. 85 year old male history of stroke, presents today from nursing home with reports of diarrhea, hypotension and concern for sepsis.  Patient reported been hypotensive with systolic blood pressure in the 70s prehospital and received Prehospital fluid boluspatient Dors is that he has been having loose stools for several months.  There is no report of blood in his stool.  He denies headache, head injury, chest pain, dyspnea, nausea, or vomiting. Review of recent records reveal that patient was discharged October 19 with diagnoses of right lower extremity cellulitis, anemia, history of duodenal ulcer, A. fib with slow ventricular response, patient has been on Eliquis and it was held and restarted just prior to discharge. Patient has a pink MOST form that is DNR with limited treatment noted.  He reports that his next of kin is his sister.  Past Medical History:  Diagnosis Date   Alcohol abuse 01/21/2013   Ascites    HISTORY   CAD in native artery    Choledocholithiasis    Chronic combined systolic and diastolic congestive heart failure ([place]) 05/06/2015   Chronic kidney disease, stage 3b (HCC)    Conduction disorder of the heart 09/25/2013   COPD (chronic obstructive pulmonary disease) (HCC)    CVA (cerebral infarction) 09/19/2013   GERD (gastroesophageal reflux disease) 10/11/2013   GI bleed    a. 03/2013 a/w hemorrhagic shock - EGD on 03/1013, which revealed active bleeding from proximal duodenum, with complete hemostasis following epinephrine injection therapy. At least 2 duodenal ulcers identified.   Hemorrhagic shock  ([place]) 03/16/2013   History of colonic diverticulitis    Hyperkalemia    Hyperlipidemia    Hypertension    Hypocalcemia    Ichthyosis    Lymphadenopathy    Mild carotid artery disease (HCC)    Mitral regurgitation    Nonsustained ventricular tachycardia 05/09/2015   Olecranon bursitis    Onychomycosis    PAD (peripheral artery disease) (HCC)    a. suspected by noninvasive testing -> conservative rx.   Permanent atrial fibrillation ([place]) 09/23/2013   Prostate cancer ([place])    Status post lung surgery 12/07/2001   BENIGHN LESION ON RIGHT LUNG REMOVED   Thrombocytopenia ([place]) 09/25/2013   UNSPECIFIED PERIPHERAL VASCULAR DISEASE 02/18/2011   Qualifier: Diagnosis of  By: Terald Sleeper     Vitamin D deficiency     Patient Active Problem List   Diagnosis Date Noted   C. difficile colitis 10/13/2021   Hypotension 11/02/2021   Leukocytosis 10/07/2021   Hypokalemia 10/31/2021   History of CHF (congestive heart failure) 10/23/2021   Pressure injury of skin 09/18/2021   Protein-calorie malnutrition, severe 09/18/2021   Ambulatory dysfunction 09/17/2021   Symptomatic bradycardia 09/17/2021   Abrasion of lower leg with infection 09/17/2021   Peripheral arterial disease ([place]) 01/20/2017   Nonsustained ventricular tachycardia 05/09/2015   Closed fracture of femur, neck ([place]) 05/06/2015   Chronic combined systolic and diastolic congestive heart failure ([place]) 05/06/2015   GERD (gastroesophageal reflux disease) 10/11/2013   CAD (coronary artery disease) 10/11/2013   Atrial flutter ([place]) 09/25/2013   Conduction disorder of the  heart 09/25/2013   Thrombocytopenia ([place]) 09/25/2013   Atrial fibrillation ([place]) 09/23/2013   Secondary cardiomyopathy, unspecified 09/23/2013   CVA (cerebral infarction) 09/19/2013   CKD (chronic kidney disease), stage III ([place]) 09/19/2013   Alcohol abuse 01/21/2013   Tobacco abuse 01/21/2013   Hyponatremia 01/21/2013   Hypertension    COPD (chronic  obstructive pulmonary disease) ([place])    Choledocholithiasis    Coronary arteriosclerosis    Hyperlipidemia    Ichthyosis    Mitral regurgitation    Onychomycosis    Vitamin D deficiency    Aortic stenosis    Prostate cancer ([place])    Ascites    History of colonic diverticulitis    Peripheral vascular disease ([place]) 02/18/2011   OTHER SYMPTOMS INVOLVING CARDIOVASCULAR SYSTEM 02/18/2011    Past Surgical History:  Procedure Laterality Date   BIOPSY  09/22/2021   Procedure: BIOPSY;  Surgeon: Jackquline Denmark, MD;  Location: [place];  Service: Endoscopy;;   COLONOSCOPY     ESOPHAGEAL DILATION  09/22/2021   Procedure: ESOPHAGEAL DILATION;  Surgeon: Jackquline Denmark, MD;  Location: [facility] ENDOSCOPY;  Service: Endoscopy;;   ESOPHAGOGASTRODUODENOSCOPY N/A 03/16/2013   Procedure: ESOPHAGOGASTRODUODENOSCOPY (EGD);  Surgeon: Cleotis Nipper, MD;  Location: [facility] ENDOSCOPY;  Service: Endoscopy;  Laterality: N/A;  will probably want to do at bedside in ICU---bed placement pending   ESOPHAGOGASTRODUODENOSCOPY (EGD) WITH PROPOFOL N/A 09/22/2021   Procedure: ESOPHAGOGASTRODUODENOSCOPY (EGD) WITH PROPOFOL;  Surgeon: Jackquline Denmark, MD;  Location: [facility] ENDOSCOPY;  Service: Endoscopy;  Laterality: N/A;   FEMUR IM NAIL Left 01/21/2013   Procedure: INTRAMEDULLARY (IM) NAIL FEMORAL;  Surgeon: Hessie Dibble, MD;  Location: [facility] ORS;  Service: Orthopedics;  Laterality: Left;   HEMORRHOID SURGERY  12/07/1998   HIP PINNING,CANNULATED Right 05/07/2015   Procedure: CANNULATED HIP PINNING;  Surgeon: Phylliss Bob, MD;  Location: [facility] ORS;  Service: Orthopedics;  Laterality: Right;   LOOP RECORDER IMPLANT N/A 09/22/2013   Procedure: LOOP RECORDER IMPLANT;  Surgeon: Coralyn Mark, MD;  Location: [place] CATH LAB;  Service: Cardiovascular;  Laterality: N/A;   LUNG SURGERY  12/07/2001   BENIGN LESION ON RIGHT LUNG REMOVED       Family History  Problem Relation Age of Onset   Hypertension Mother 6   Hypertension Father 5    Hypertension Sister    Hypertension Brother    Hypertension Sister    Hypertension Brother    Hypertension Brother    Hypertension Brother    Hypertension Brother     Social History   Tobacco Use   Smoking status: Every Day    Packs/day: 1.50    Years: 65.00    Pack years: 97.50    Types: Cigarettes   Smokeless tobacco: Never  Substance Use Topics   Alcohol use: Yes    Comment: 1 1/2 fifths a week, 4 shots/day   Drug use: No    Home Medications Prior to Admission medications   Medication Sig Start Date End Date Taking? Authorizing Provider  albuterol (PROVENTIL HFA;VENTOLIN HFA) 108 (90 BASE) MCG/ACT inhaler Inhale 1 puff into the lungs every 6 (six) hours as needed for wheezing or shortness of breath.   Yes [provider]  alendronate (FOSAMAX) 70 MG tablet Take 1 tablet by mouth once a week. Mondays 08/23/18  Yes [provider]  amLODipine (NORVASC) 2.5 MG tablet Take 2.5 mg by mouth daily.   Yes [provider]  apixaban (ELIQUIS) 2.5 MG TABS tablet Take 1 tablet (2.5 mg total) by mouth  2 (two) times daily. 09/25/13  Yes Velvet Bathe, MD  atorvastatin (LIPITOR) 40 MG tablet Take 40 mg by mouth every evening.   Yes [provider]  bisacodyl (DULCOLAX) 5 MG EC tablet Take 1 tablet (5 mg total) by mouth daily as needed for moderate constipation. 09/23/21  Yes Antonieta Pert, MD  Cholecalciferol 25 MCG (1000 UT) tablet Take 1 tablet by mouth daily.   Yes [provider]  docusate sodium (COLACE) 100 MG capsule Take 200 mg by mouth daily as needed for mild constipation.   Yes [provider]  feeding supplement (ENSURE ENLIVE / ENSURE PLUS) LIQD Take 237 mLs by mouth 2 (two) times daily between meals. 09/23/21  Yes Antonieta Pert, MD  Fluticasone-Umeclidin-Vilant 100-62.5-25 MCG/INH AEPB Inhale 1 puff into the lungs daily.   Yes [provider]  folic acid (FOLVITE) 1 MG tablet Take 1 tablet (1 mg total) by mouth daily.  09/24/21  Yes Antonieta Pert, MD  guaiFENesin (MUCINEX) 600 MG 12 hr tablet Take 600 mg by mouth 2 (two) times daily.   Yes [provider]  Multiple Vitamins-Minerals (PRESERVISION AREDS PO) Take 1 tablet by mouth 2 (two) times daily.   Yes [provider]  nicotine (NICODERM CQ - DOSED IN MG/24 HOURS) 14 mg/24hr patch Place 14 mg onto the skin daily.   Yes [provider]  nutrition supplement, JUVEN, (JUVEN) PACK Take 1 packet by mouth 2 (two) times daily between meals. 09/23/21  Yes Antonieta Pert, MD  nystatin cream (MYCOSTATIN) Apply 1 application topically 3 (three) times daily.   Yes [provider]  omega-3 acid ethyl esters (LOVAZA) 1 G capsule Take 1 g by mouth 2 (two) times daily.   Yes [provider]  pantoprazole (PROTONIX) 40 MG tablet Take 40 mg by mouth daily.   Yes [provider]  saccharomyces boulardii (FLORASTOR) 250 MG capsule Take 250 mg by mouth 2 (two) times daily.   Yes [provider]  sacubitril-valsartan (ENTRESTO) 49-51 MG Take 1 tablet by mouth 2 (two) times daily.   Yes [provider]  thiamine 100 MG tablet Take 100 mg by mouth daily.   Yes [provider]  Travoprost, BAK Free, (TRAVATAN) 0.004 % SOLN ophthalmic solution Place 1 drop into both eyes at bedtime.   Yes [provider]  triamcinolone ointment (KENALOG) 0.1 % Apply 1 application topically 3 (three) times daily.   Yes [provider]  ZINC OXIDE EX Apply 1 application topically in the morning, at noon, and at bedtime.   Yes [provider]    Allergies    Patient has no active allergies.  Review of Systems   Review of Systems  Constitutional:  Positive for appetite change.  Gastrointestinal:  Positive for abdominal pain and diarrhea.  All other systems reviewed and are negative.  Physical Exam Updated Vital Signs BP 107/62 (BP Location: Right Arm)   Pulse 62   Temp (!) 97.3 F (36.3 C)  (Oral)   Resp 20   Wt 75.1 kg   SpO2 95%   BMI 22.45 kg/m   Physical Exam Vitals and nursing note reviewed.  HENT:     Head: Normocephalic.     Right Ear: External ear normal.     Nose: Nose normal.     Mouth/Throat:     Mouth: Mucous membranes are dry.     Pharynx: Oropharynx is clear.  Eyes:     Pupils: Pupils are equal, round, and reactive  to light.  Cardiovascular:     Rate and Rhythm: Normal rate. Rhythm irregular.  Pulmonary:     Effort: Pulmonary effort is normal.     Breath sounds: Normal breath sounds.  Abdominal:     General: Abdomen is flat. Bowel sounds are normal.     Palpations: Abdomen is soft.     Tenderness: There is abdominal tenderness.     Comments: Diffuse suprapubic and epigastric to left-sided and left lower quadrant abdominal pain  Genitourinary:    Comments: Rectum is flaccid There is copious amount of yellowish colored fluid No bleeding noted Musculoskeletal:     Cervical back: Normal range of motion.     Comments: Upper extremities with normal movement Lower extremities have some mild erythema with a small area of skin breakdown on the right ankle  Skin:    General: Skin is warm.     Capillary Refill: Capillary refill takes 2 to 3 seconds.     Comments: Sacral ulcer with some  sacral erythema  Neurological:     Mental Status: He is alert.     Motor: Weakness present.     Comments: Bilateral lower extremity weakness Arms appear to have full active range of motion with good bilateral strength  Psychiatric:        Mood and Affect: Mood normal.    ED Results / Procedures / Treatments   Labs (all labs ordered are listed, but only abnormal results are displayed) Labs Reviewed  C DIFFICILE QUICK SCREEN W PCR REFLEX   - Abnormal; Notable for the following components:      Result Value   C Diff antigen POSITIVE (*)    All other components within normal limits  CLOSTRIDIUM DIFFICILE BY PCR, REFLEXED - Abnormal; Notable for the following  components:   Toxigenic C. Difficile by PCR POSITIVE (*)    All other components within normal limits  COMPREHENSIVE METABOLIC PANEL - Abnormal; Notable for the following components:   Potassium 2.4 (*)    CO2 20 (*)    BUN 45 (*)    Creatinine, Ser 2.17 (*)    Calcium 7.8 (*)    Total Protein 5.0 (*)    Albumin 1.8 (*)    AST 10 (*)    GFR, Estimated 29 (*)    All other components within normal limits  CBC WITH DIFFERENTIAL/PLATELET - Abnormal; Notable for the following components:   WBC 19.1 (*)    RBC 3.52 (*)    Hemoglobin 10.5 (*)    HCT 33.0 (*)    Neutro Abs 15.7 (*)    Abs Immature Granulocytes 0.15 (*)    All other components within normal limits  PROTIME-INR - Abnormal; Notable for the following components:   Prothrombin Time 16.0 (*)    INR 1.3 (*)    All other components within normal limits  MAGNESIUM - Abnormal; Notable for the following components:   Magnesium 1.1 (*)    All other components within normal limits  CBC - Abnormal; Notable for the following components:   WBC 12.8 (*)    RBC 2.49 (*)    Hemoglobin 7.4 (*)    HCT 23.4 (*)    All other components within normal limits  BASIC METABOLIC PANEL - Abnormal; Notable for the following components:   Potassium 3.0 (*)    Chloride 112 (*)    CO2 14 (*)    Glucose, Bld 56 (*)    BUN 42 (*)    Creatinine, Ser  2.01 (*)    Calcium 7.2 (*)    GFR, Estimated 32 (*)    All other components within normal limits  PREALBUMIN - Abnormal; Notable for the following components:   Prealbumin <5 (*)    All other components within normal limits  GLUCOSE, CAPILLARY - Abnormal; Notable for the following components:   Glucose-Capillary 61 (*)    All other components within normal limits  MAGNESIUM - Abnormal; Notable for the following components:   Magnesium 1.5 (*)    All other components within normal limits  BASIC METABOLIC PANEL - Abnormal; Notable for the following components:   Potassium 3.4 (*)    Chloride  114 (*)    CO2 15 (*)    BUN 41 (*)    Creatinine, Ser 2.07 (*)    Calcium 7.4 (*)    GFR, Estimated 31 (*)    All other components within normal limits  GLUCOSE, CAPILLARY - Abnormal; Notable for the following components:   Glucose-Capillary 110 (*)    All other components within normal limits  BASIC METABOLIC PANEL - Abnormal; Notable for the following components:   CO2 18 (*)    Glucose, Bld 119 (*)    BUN 36 (*)    Creatinine, Ser 1.74 (*)    Calcium 7.4 (*)    GFR, Estimated 38 (*)    All other components within normal limits  CBC WITH DIFFERENTIAL/PLATELET - Abnormal; Notable for the following components:   WBC 10.9 (*)    RBC 2.71 (*)    Hemoglobin 8.1 (*)    HCT 25.6 (*)    Eosinophils Absolute 0.7 (*)    Abs Immature Granulocytes 0.08 (*)    All other components within normal limits  GLUCOSE, CAPILLARY - Abnormal; Notable for the following components:   Glucose-Capillary 105 (*)    All other components within normal limits  GLUCOSE, CAPILLARY - Abnormal; Notable for the following components:   Glucose-Capillary 113 (*)    All other components within normal limits  GLUCOSE, CAPILLARY - Abnormal; Notable for the following components:   Glucose-Capillary 109 (*)    All other components within normal limits  CULTURE, BLOOD (ROUTINE X 2)  CULTURE, BLOOD (ROUTINE X 2)  RESP PANEL BY RT-PCR (FLU A&B, COVID) ARPGX2  URINE CULTURE  LACTIC ACID, PLASMA  APTT  GLUCOSE, CAPILLARY  GLUCOSE, CAPILLARY  MAGNESIUM  PHOSPHORUS  URINALYSIS, ROUTINE W REFLEX MICROSCOPIC    EKG EKG Interpretation  Date/Time:  Friday October 17 2021 08:17:41 EST Ventricular Rate:  87 PR Interval:    QRS Duration: 172 QT Interval:  416 QTC Calculation: 501 R Axis:   -85 Text Interpretation: Atrial fibrillation Ventricular premature complex RBBB and LAFB Confirmed by Pattricia Boss [phone]) on 10/20/2021 8:32:58 AM  Radiology CT ABDOMEN PELVIS WO CONTRAST  Result Date:  10/23/2021 CLINICAL DATA:  Diverticulitis suspected a sepsis EXAM: CT ABDOMEN AND PELVIS WITHOUT CONTRAST TECHNIQUE: Multidetector CT imaging of the abdomen and pelvis was performed following the standard protocol without IV contrast. COMPARISON:  CT abdomen and pelvis 07/22/2021 FINDINGS: Lower chest: Moderate size bilateral pleural effusions with adjacent basilar atelectasis. Coronary artery calcifications. Aortic valve calcifications. Hepatobiliary: No focal liver abnormality is seen. The gallbladder is likely decompressed. Pancreas: Unremarkable. No pancreatic ductal dilatation or surrounding inflammatory changes. Spleen: Normal in size without focal abnormality. Adrenals/Urinary Tract: Adrenal glands are unremarkable. The kidneys appear mildly atrophic. No hydronephrosis or nephrolithiasis. Unchanged bilateral bladder diverticula. Stomach/Bowel: The stomach is within normal limits. There is  no evidence of bowel obstruction. The appendix is normal. There are multiple fluid-filled loops of small bowel throughout the abdomen without a transition point. This continues as a fluid-filled cecum and ascending colon. There is long segment inflammatory stranding along the sigmoid colon and rectum, with submucosal edema and a few diverticula present. There is also mild inflammatory stranding along the of the ascending colon and splenic flexure to a lesser degree than the rectosigmoid colon. Vascular/Lymphatic: Aortoiliac atherosclerotic calcifications. Infrarenal abdominal aortic aneurysm measuring 3.1 cm, previously 3.1 cm (series 3, image 44). This was remeasured for consistency. Reproductive: No clear abnormality on CT, though the patient reportedly has a history of prostate cancer. Other: Since the prior exam, the patient appears to have lost a significant amount of body fat. No hernia. Musculoskeletal: Prior bilateral hip fixation. There is avascular necrosis of the right femoral head with articular surface  collapse, unchanged from prior exam. Mild bilateral hip osteoarthritis. Multilevel degenerative changes of the spine. No suspicious lytic or blastic lesions. There is a sacral decubitus ulcer, with underlying sclerosis at the sacrococcygeal junction, likely reflecting osteomyelitis. This bony changes new since the prior exam. Generalized muscle atrophy. IMPRESSION: Long segment inflammatory stranding of the rectosigmoid colon and to a lesser degree at the splenic flexure and ascending colon. There are few diverticula present in the sigmoid colon. Findings are favored to represent colitis rather than diverticulitis. No focal/drainable fluid collection. Recommend testing for C. difficile. Fluid-filled loops of small bowel, likely reflecting small-bowel enteritis as well. No evidence of bowel obstruction. Moderate-sized bilateral pleural effusions with adjacent basilar atelectasis. Sacral decubitus ulcer with underlying new sclerosis at the sacrococcygeal junction, compatible with osteomyelitis. Unchanged avascular necrosis of the right femoral head with articular surface collapse. Since the prior abdominopelvic CT in August, patient appears to have lost a significant amount of body fat. Electronically Signed   By: Maurine Simmering M.D.   On: 10/21/2021 13:22   DG CHEST PORT 1 VIEW  Result Date: 11/03/2021 CLINICAL DATA:  Shortness of breath. EXAM: PORTABLE CHEST 1 VIEW COMPARISON:  Chest x-Naven Giambalvo 11/04/2021. CT abdomen and pelvis 10/17/2016. FINDINGS: There are small bilateral pleural effusions. There is band of opacity in the right mid lung. The heart is upper limits of normal in size. There are atherosclerotic calcifications of the aorta. There is no evidence for pneumothorax or acute fracture. IMPRESSION: Small bilateral pleural effusions. Right mid lung atelectasis/airspace disease. Electronically Signed   By: Ronney Asters M.D.   On: 10/29/2021 17:11    Procedures .Critical Care Performed by: Pattricia Boss,  MD Authorized by: Pattricia Boss, MD   Critical care provider statement:    Critical care time (minutes):  80   Critical care end time:  10/07/2021 1:44 PM   Critical care was necessary to treat or prevent imminent or life-threatening deterioration of the following conditions:  Cardiac failure, renal failure and dehydration   Critical care was time spent personally by me on the following activities:  Development of treatment plan with patient or surrogate, discussions with consultants, evaluation of patient's response to treatment, examination of patient, ordering and review of laboratory studies, ordering and review of radiographic studies, ordering and performing treatments and interventions, pulse oximetry, re-evaluation of patient's condition and review of old charts   Medications Ordered in ED Medications  vancomycin (VANCOCIN) capsule 125 mg (125 mg Oral Given 10/18/21 2205)  sodium chloride flush (NS) 0.9 % injection 3 mL (3 mLs Intravenous Given 10/18/21 2207)  saccharomyces boulardii (FLORASTOR) capsule 250  mg (250 mg Oral Given 10/18/21 2204)  apixaban (ELIQUIS) tablet 2.5 mg (2.5 mg Oral Given 10/18/21 2205)  feeding supplement (ENSURE ENLIVE / ENSURE PLUS) liquid 237 mL (237 mLs Oral Not Given 10/18/21 1410)  nutrition supplement (JUVEN) (JUVEN) powder packet 1 packet (1 packet Oral Not Given 10/18/21 1415)  thiamine tablet 100 mg (100 mg Oral Given 10/18/21 0842)  guaiFENesin (MUCINEX) 12 hr tablet 600 mg (600 mg Oral Given 10/18/21 2204)  pantoprazole (PROTONIX) EC tablet 40 mg (40 mg Oral Given 10/18/21 0842)  atorvastatin (LIPITOR) tablet 40 mg (40 mg Oral Given 10/18/21 1826)  latanoprost (XALATAN) 0.005 % ophthalmic solution 1 drop (1 drop Both Eyes Given 10/18/21 2206)  fluticasone furoate-vilanterol (BREO ELLIPTA) 100-25 MCG/ACT 1 puff (1 puff Inhalation Given 10/18/21 0733)  umeclidinium bromide (INCRUSE ELLIPTA) 62.5 MCG/ACT 1 puff (1 puff Inhalation Given 10/18/21 0734)   multivitamin (PROSIGHT) tablet 1 tablet (1 tablet Oral Given 10/18/21 0842)  dextrose 5 %-0.45 % sodium chloride infusion ( Intravenous Infusion Verify 10/19/21 0407)  lactated ringers bolus 1,000 mL (0 mLs Intravenous Stopped 10/23/2021 1034)  potassium chloride 10 mEq in 100 mL IVPB (0 mEq Intravenous Stopped 11/02/2021 1433)  0.9 %  sodium chloride infusion ( Intravenous New Bag/Given 11/03/2021 1044)  lactated ringers bolus 1,000 mL (0 mLs Intravenous Stopped 10/26/2021 1238)  lactated ringers bolus 1,000 mL (0 mLs Intravenous Stopped 10/12/2021 1433)  potassium chloride SA (KLOR-CON) CR tablet 60 mEq (60 mEq Oral Given 10/08/2021 2115)  sodium chloride 0.9 % bolus 1,000 mL (0 mLs Intravenous Stopping Infusion hung by another clincian 10/22/2021 1844)  albumin human 25 % solution 25 g (0 g Intravenous Stopping Infusion hung by another clincian 10/18/21 0035)  magnesium sulfate IVPB 2 g 50 mL (0 g Intravenous Stopping Infusion hung by another clincian 10/20/2021 2358)  albumin human 5 % solution 25 g (0 g Intravenous Stopping Infusion hung by another clincian 10/18/21 0459)  potassium chloride 10 mEq in 100 mL IVPB (0 mEq Intravenous Stopping Infusion hung by another clincian 10/18/21 1041)  magnesium sulfate IVPB 4 g 100 mL (0 g Intravenous Stopping Infusion hung by another clincian 10/18/21 1858)  potassium chloride SA (KLOR-CON) CR tablet 40 mEq (40 mEq Oral Given 10/18/21 1451)  lactated ringers bolus 500 mL (0 mLs Intravenous Stopped 10/18/21 1651)    ED Course  I have reviewed the triage vital signs and the nursing notes.  Pertinent labs & imaging results that were available during my care of the patient were reviewed by me and considered in my medical decision making (see chart for details).  Clinical Course as of 10/19/21 0858  Fri Nov 11, 924  [id number] 85 year old man with history of stroke presents today from nursing home with report that he could be sepsis.  They report he has wounds on his  ankles, sacrum, and has had diarrhea.  He was hypotensive and sent secondary to this. Patient is afebrile here with a temp of 97 degrees.  He has been hypotensive with systolic blood pressures 62I to 90s.  He has received a liter of lactated ringer without significant improvement in his blood pressure is on his second round of LR currently. White count 19,100 Initial lactic acid is normal COVID and flu are negative He is significantly hypokalemic with a potassium of 2.4 and 3 runs have been ordered. Creatinine increased from 3 weeks ago normal at 1.2-2.17 today. Patient looks significantly volume depleted. He has a runny watery stool here.  C. difficile has  been sent. Plan to continue fluid bolus aggressively Correction of electrolyte abnormalities Abdomens appears somewhat tender and CT is ordered, but pending at this time Chest x-Weda Baumgarner reviewed no evidence of acute abnormalities noted  [DR]  1118 C.  Differential antigen positive Patient is going to CT at this time Blood pressure improved to systolics in the 29V after 2 L. [DR]  1311 Blood pressure has decreased again to systolic of 86.  Third liter is being given now. Awaiting CT results [DR]    Clinical Course User Index [DR] Pattricia Boss, MD   MDM Rules/Calculators/A&P                           85 year old male with multiple chronic health problems including recent admission for cellulitis presents today from skilled nursing facility with hypotension and diarrhea.  Here in the ED he is found to be positive for C. difficile.  He has a leukocytosis to 19,000.  He has a new kidney insult with creatinine increased to 2.17 from normal and hypokalemia with potassium of 2.4.  He has received 2.5 L of fluid at this time and blood pressures have continued approximately 90.  His lactic acid is 1.1 although repeat is pending at this time.  He is receiving IV potassium.  CT was obtained due to symptoms who some diffuse abdominal tenderness.  CT  reveals moderate effusions and long segment inflammatory stranding of the rectosigmoid colon and some of the ascending colon without evidence for diverticulitis with colitis favored.  This seems consistent with the C. difficile testing. Patient has MOST form in place with limited care noted. Plan consult to hospitalist as he was discharged within 1 month and will be readmission for further fluids, and treatment for his C. difficile. Final Clinical Impression(s) / ED Diagnoses Final diagnoses:  SOB (shortness of breath)  C. difficile colitis Hypokalemia Volume depletion AKI  Rx / DC Orders ED Discharge Orders     None        Pattricia Boss, MD 10/19/21 [phone]

## 2021-10-17 NOTE — ED Triage Notes (Signed)
Pt BIB EMS from [facility]; called out for possible sepsis; sacral wound present, loose stool x 2 weeks, crackles auscultated; tested for c-diff, no results yet; hypotensive with ems 80/50; 150 ns given pta; 18 ga RFA; 2L at baseline; hx dementia, pt mentating at baseline  90 systolic Capnography 20 A fib 80-100, denies afib hx RR 25-30 Cbg 127

## 2021-10-17 NOTE — Progress Notes (Signed)
Dr. Milderd Meager notified about pt Mg 1.1. New order received for pt see EMAR.

## 2021-10-17 NOTE — H&P (Signed)
History and Physical    Matthew Mahoney ASN:[id number] DOB: July 20, 1936 DOA: 10/26/2021  Referring MD/NP/PA: Tillie Fantasia Rat PCP: [name], MD  Consultants: Nahser - cardiology; Zigmund Daniel - ophthalmology; Risa Grill - urology Patient coming from: [facility] via EMS  Chief Complaint: Diarrhea  I have personally briefly reviewed patient's old medical records in [facility]   HPI: Matthew Mahoney is a 85 y.o. male with medical history significant of A. fib on Eliquis, chronic combined CHF, COPD, hypertension, hyperlipidemia, PVD, and prostate cancer presents from the nursing facility for diarrhea.  At this time history is somewhat limited from the patient due to lethargy he had just recently been hospitalized from 10/12-10/19 treated for right lower extremity cellulitis, A. fib with slow ventricular response, and anemia of unclear cause.  It was evaluated by cardiology and was advised to stop AV nodal blocking agents, but was not thought to need a pacemaker.  Patient underwent EGD found to have Schatzki ring, small hiatal hernia, erosive gastritis with no signs of active bleeding and dilated esophagus for which he it was recommended he could be started on back on Eliquis.  For the last 2 weeks patient reportedly had been having loose stools.  EMS was called due to concern for possible sepsis.  They had tested him for C. Difficile, but results had not come back yet.  He does complain of having some abdominal pain with diarrhea, but denies having any blood in his stools.  Patient has a DO NOT RESUSCITATE form in place, but states that he would be okay with pressors to help elevated blood pressure if needed.  His sister over the phone agrees that she would be okay with this if needed.  ED Course: Patient into the emergency department patient was seen to be afebrile with respirations 15-25, blood pressure as low as 73/52 with improvement after IV fluid boluses with maps maintained.  Labs  significant for WBC 19.1, hemoglobin 10.5, potassium 2.4, BUN 45, creatinine 2.17, and lactic acid 1.1.  Chest x-ray noted stable right basilar atelectasis with small right pleural effusion and minimal left basilar atelectasis.  Influenza and COVID-19 screening was negative.  Blood cultures have been obtained.  C. difficile antigen and toxigenic C. difficile by PCR was positive.  CT scan of the abdomen pelvis significant for inflammatory stranding of the rectosigmoid colon concerning for colitis.  She had been ordered 3 L of lactated Ringer's and 30 mEq of potassium chloride.  TRH called to admit  Review of Systems  Constitutional:  Positive for malaise/fatigue. Negative for fever.  Cardiovascular:  Negative for chest pain and leg swelling.  Gastrointestinal:  Positive for abdominal pain and diarrhea.  Neurological:  Negative for loss of consciousness.   Past Medical History:  Diagnosis Date   Alcohol abuse 01/21/2013   Ascites    HISTORY   CAD in native artery    Choledocholithiasis    Chronic combined systolic and diastolic congestive heart failure ([place]) 05/06/2015   Chronic kidney disease, stage 3b (HCC)    Conduction disorder of the heart 09/25/2013   COPD (chronic obstructive pulmonary disease) (HCC)    CVA (cerebral infarction) 09/19/2013   GERD (gastroesophageal reflux disease) 10/11/2013   GI bleed    a. 03/2013 a/w hemorrhagic shock - EGD on 03/1013, which revealed active bleeding from proximal duodenum, with complete hemostasis following epinephrine injection therapy. At least 2 duodenal ulcers identified.   Hemorrhagic shock ([place]) 03/16/2013   History of colonic diverticulitis  Hyperkalemia    Hyperlipidemia    Hypertension    Hypocalcemia    Ichthyosis    Lymphadenopathy    Mild carotid artery disease (HCC)    Mitral regurgitation    Nonsustained ventricular tachycardia 05/09/2015   Olecranon bursitis    Onychomycosis    PAD (peripheral artery disease) (HCC)    a.  suspected by noninvasive testing -> conservative rx.   Permanent atrial fibrillation ([place]) 09/23/2013   Prostate cancer ([place])    Status post lung surgery 12/07/2001   BENIGHN LESION ON RIGHT LUNG REMOVED   Thrombocytopenia ([place]) 09/25/2013   UNSPECIFIED PERIPHERAL VASCULAR DISEASE 02/18/2011   Qualifier: Diagnosis of  By: Terald Sleeper     Vitamin D deficiency     Past Surgical History:  Procedure Laterality Date   BIOPSY  09/22/2021   Procedure: BIOPSY;  Surgeon: Jackquline Denmark, MD;  Location: [facility] ENDOSCOPY;  Service: Endoscopy;;   COLONOSCOPY     ESOPHAGEAL DILATION  09/22/2021   Procedure: ESOPHAGEAL DILATION;  Surgeon: Jackquline Denmark, MD;  Location: [facility] ENDOSCOPY;  Service: Endoscopy;;   ESOPHAGOGASTRODUODENOSCOPY N/A 03/16/2013   Procedure: ESOPHAGOGASTRODUODENOSCOPY (EGD);  Surgeon: Cleotis Nipper, MD;  Location[facility] ENDOSCOPY;  Service: Endoscopy;  Laterality: N/A;  will probably want to do at bedside in ICU---bed placement pending   ESOPHAGOGASTRODUODENOSCOPY (EGD) WITH PROPOFOL N/A 09/22/2021   Procedure: ESOPHAGOGASTRODUODENOSCOPY (EGD) WITH PROPOFOL;  Surgeon: Jackquline Denmark, MD;  Location: [facility] ENDOSCOPY;  Service: Endoscopy;  Laterality: N/A;   FEMUR IM NAIL Left 01/21/2013   Procedure: INTRAMEDULLARY (IM) NAIL FEMORAL;  Surgeon: Hessie Dibble, MD;  Location: [facility] ORS;  Service: Orthopedics;  Laterality: Left;   HEMORRHOID SURGERY  12/07/1998   HIP PINNING,CANNULATED Right 05/07/2015   Procedure: CANNULATED HIP PINNING;  Surgeon: Phylliss Bob, MD;  Location: [facility] ORS;  Service: Orthopedics;  Laterality: Right;   LOOP RECORDER IMPLANT N/A 09/22/2013   Procedure: LOOP RECORDER IMPLANT;  Surgeon: Coralyn Mark, MD;  Location: [facility] CATH LAB;  Service: Cardiovascular;  Laterality: N/A;   LUNG SURGERY  12/07/2001   BENIGN LESION ON RIGHT LUNG REMOVED     reports that he has been smoking cigarettes. He has a 97.50 pack-year smoking history. He has never used smokeless tobacco. He reports  current alcohol use. He reports that he does not use drugs.  No Active Allergies  Family History  Problem Relation Age of Onset   Hypertension Mother 68   Hypertension Father 39   Hypertension Sister    Hypertension Brother    Hypertension Sister    Hypertension Brother    Hypertension Brother    Hypertension Brother    Hypertension Brother     Prior to Admission medications   Medication Sig Start Date End Date Taking? Authorizing Provider  albuterol (PROVENTIL HFA;VENTOLIN HFA) 108 (90 BASE) MCG/ACT inhaler Inhale 1 puff into the lungs every 6 (six) hours as needed for wheezing or shortness of breath.    [provider]  alendronate (FOSAMAX) 70 MG tablet Take 1 tablet by mouth once a week. 08/23/18   [provider]  amLODipine (NORVASC) 2.5 MG tablet Take 2.5 mg by mouth daily.    [provider]  apixaban (ELIQUIS) 2.5 MG TABS tablet Take 1 tablet (2.5 mg total) by mouth 2 (two) times daily. 09/25/13   Velvet Bathe, MD  atorvastatin (LIPITOR) 40 MG tablet Take 40 mg by mouth every evening.    [provider]  bisacodyl (DULCOLAX) 5 MG EC tablet Take 1 tablet (  5 mg total) by mouth daily as needed for moderate constipation. 09/23/21   Antonieta Pert, MD  Cholecalciferol 25 MCG (1000 UT) tablet Take 1 tablet by mouth daily.    [provider]  docusate sodium (COLACE) 100 MG capsule Take 200 mg by mouth daily as needed for mild constipation.    [provider]  feeding supplement (ENSURE ENLIVE / ENSURE PLUS) LIQD Take 237 mLs by mouth 2 (two) times daily between meals. 09/23/21   Antonieta Pert, MD  Fluticasone-Umeclidin-Vilant 100-62.5-25 MCG/INH AEPB Inhale 1 puff into the lungs daily.    [provider]  folic acid (FOLVITE) 1 MG tablet Take 1 tablet (1 mg total) by mouth daily. 09/24/21   Antonieta Pert, MD  Multiple Vitamins-Minerals (PRESERVISION AREDS PO) Take 1 tablet by mouth 2 (two) times daily.    [provider]   nutrition supplement, JUVEN, (JUVEN) PACK Take 1 packet by mouth 2 (two) times daily between meals. 09/23/21   Antonieta Pert, MD  omega-3 acid ethyl esters (LOVAZA) 1 G capsule Take 1 g by mouth 2 (two) times daily.    [provider]  pantoprazole (PROTONIX) 40 MG tablet Take 40 mg by mouth daily.    [provider]  sacubitril-valsartan (ENTRESTO) 49-51 MG Take 1 tablet by mouth 2 (two) times daily.    [provider]  thiamine 100 MG tablet Take 100 mg by mouth daily.    [provider]  Travoprost, BAK Free, (TRAVATAN) 0.004 % SOLN ophthalmic solution Place 1 drop into both eyes at bedtime.    [provider]    Physical Exam:  Constitutional: Elderly male who appears acutely ill Vitals:   10/08/2021 1145 10/19/2021 1215 10/12/2021 1230 10/14/2021 1305  BP: (!) 97/47 (!) 94/58 (!) 85/56 (!) 86/62  Pulse: 83 80 88 73  Resp: (!) 24 19 19 15   Temp:    (!) 97.1 F (36.2 C)  TempSrc:    Oral  SpO2: 99% 100% 100% 100%   Eyes: PERRL, lids and conjunctivae normal ENMT: Mucous membranes are dry.  Posterior pharynx clear of any exudate or lesions.   Neck: normal, supple, no masses, no thyromegaly Respiratory: Mildly tachypneic with crackles appreciated in the lower lung fields.  Patient is on 2 L of nasal cannula oxygen currently with O2 saturation maintained. Cardiovascular: Irregular irregular.  Swelling noted of the right upper extremity due to infiltrated IV access.  2+ pedal pulses. No carotid bruits.  Abdomen: Tenderness palpation of the lower abdomen. Bowel sounds positive.  Musculoskeletal: no clubbing / cyanosis. No joint deformity upper and lower extremities. Good ROM, no contractures. Normal muscle tone.  Skin: Sacral ulcer appreciated with mild surrounding erythema.  Lower extremity skin breakdown appreciated of the right lower extremity and appears to be healing without significant erythema  Neurologic: CN 2-12 grossly intact.  Appears able to  move all extremities Psychiatric: Lethargic, oriented times person and place.    Labs on Admission: I have personally reviewed following labs and imaging studies  CBC: Recent Labs  Lab 10/16/2021 0845  WBC 19.1*  NEUTROABS 15.7*  HGB 10.5*  HCT 33.0*  MCV 93.8  PLT 329   Basic Metabolic Panel: Recent Labs  Lab 10/11/2021 0845  NA 140  K 2.4*  CL 107  CO2 20*  GLUCOSE 87  BUN 45*  CREATININE 2.17*  CALCIUM 7.8*   GFR: CrCl cannot be calculated (Unknown ideal weight.). Liver Function Tests: Recent Labs  Lab 10/31/2021 0845  AST  10*  ALT 9  ALKPHOS 78  BILITOT 1.0  PROT 5.0*  ALBUMIN 1.8*   No results for input(s): LIPASE, AMYLASE in the last 168 hours. No results for input(s): AMMONIA in the last 168 hours. Coagulation Profile: Recent Labs  Lab 10/23/2021 0845  INR 1.3*   Cardiac Enzymes: No results for input(s): CKTOTAL, CKMB, CKMBINDEX, TROPONINI in the last 168 hours. BNP (last 3 results) No results for input(s): PROBNP in the last 8760 hours. HbA1C: No results for input(s): HGBA1C in the last 72 hours. CBG: No results for input(s): GLUCAP in the last 168 hours. Lipid Profile: No results for input(s): CHOL, HDL, LDLCALC, TRIG, CHOLHDL, LDLDIRECT in the last 72 hours. Thyroid Function Tests: No results for input(s): TSH, T4TOTAL, FREET4, T3FREE, THYROIDAB in the last 72 hours. Anemia Panel: No results for input(s): VITAMINB12, FOLATE, FERRITIN, TIBC, IRON, RETICCTPCT in the last 72 hours. Urine analysis:    Component Value Date/Time   COLORURINE YELLOW 05/06/2015 0038   APPEARANCEUR CLEAR 05/06/2015 0038   LABSPEC 1.012 05/06/2015 0038   PHURINE 5.5 05/06/2015 0038   GLUCOSEU NEGATIVE 05/06/2015 0038   HGBUR NEGATIVE 05/06/2015 0038   BILIRUBINUR NEGATIVE 05/06/2015 0038   KETONESUR NEGATIVE 05/06/2015 0038   PROTEINUR 100 (A) 05/06/2015 0038   UROBILINOGEN 0.2 05/06/2015 0038   NITRITE NEGATIVE 05/06/2015 0038   LEUKOCYTESUR NEGATIVE 05/06/2015  0038   Sepsis Labs: Recent Results (from the past 240 hour(s))  Resp Panel by RT-PCR (Flu A&B, Covid) Nasopharyngeal Swab     Status: None   Collection Time: 10/21/2021  8:45 AM   Specimen: Nasopharyngeal Swab; Nasopharyngeal(NP) swabs in vial transport medium  Result Value Ref Range Status   SARS Coronavirus 2 by RT PCR NEGATIVE NEGATIVE Final    Comment: (NOTE) SARS-CoV-2 target nucleic acids are NOT DETECTED.  The SARS-CoV-2 RNA is generally detectable in upper respiratory specimens during the acute phase of infection. The lowest concentration of SARS-CoV-2 viral copies this assay can detect is 138 copies/mL. A negative result does not preclude SARS-Cov-2 infection and should not be used as the sole basis for treatment or other patient management decisions. A negative result may occur with  improper specimen collection/handling, submission of specimen other than nasopharyngeal swab, presence of viral mutation(s) within the areas targeted by this assay, and inadequate number of viral copies(<138 copies/mL). A negative result must be combined with clinical observations, patient history, and epidemiological information. The expected result is Negative.  Fact Sheet for Patients:  EntrepreneurPulse.com.au  Fact Sheet for Healthcare Providers:  IncredibleEmployment.be  This test is no t yet approved or cleared by the [place] FDA and  has been authorized for detection and/or diagnosis of SARS-CoV-2 by FDA under an Emergency Use Authorization (EUA). This EUA will remain  in effect (meaning this test can be used) for the duration of the COVID-19 declaration under Section 564(b)(1) of the Act, 21 U.S.C.section 360bbb-3(b)(1), unless the authorization is terminated  or revoked sooner.       Influenza A by PCR NEGATIVE NEGATIVE Final   Influenza B by PCR NEGATIVE NEGATIVE Final    Comment: (NOTE) The Xpert Xpress SARS-CoV-2/FLU/RSV plus assay  is intended as an aid in the diagnosis of influenza from Nasopharyngeal swab specimens and should not be used as a sole basis for treatment. Nasal washings and aspirates are unacceptable for Xpert Xpress SARS-CoV-2/FLU/RSV testing.  Fact Sheet for Patients: EntrepreneurPulse.com.au  Fact Sheet for Healthcare Providers: IncredibleEmployment.be  This test is not yet approved or cleared by  the [employer] and has been authorized for detection and/or diagnosis of SARS-CoV-2 by FDA under an Emergency Use Authorization (EUA). This EUA will remain in effect (meaning this test can be used) for the duration of the COVID-19 declaration under Section 564(b)(1) of the Act, 21 U.S.C. section 360bbb-3(b)(1), unless the authorization is terminated or revoked.  Performed at [facility], [place] [address] [place], [place] [address]   C Difficile Quick Screen w PCR reflex     Status: Abnormal   Collection Time: 10/13/2021  8:45 AM   Specimen: STOOL  Result Value Ref Range Status   C Diff antigen POSITIVE (A) NEGATIVE Final   C Diff toxin NEGATIVE NEGATIVE Final   C Diff interpretation Results are indeterminate. See PCR results.  Final    Comment: Performed at [facility], [place] [address] [place], [place] [address]  C. Diff by PCR, Reflexed     Status: Abnormal   Collection Time: 10/14/2021  8:45 AM  Result Value Ref Range Status   Toxigenic C. Difficile by PCR POSITIVE (A) NEGATIVE Final    Comment: Positive for toxigenic C. difficile with little to no toxin production. Only treat if clinical presentation suggests symptomatic illness. Performed at [facility], [place] [address], [place], [place] [address]      Radiological Exams on Admission: CT ABDOMEN PELVIS WO CONTRAST  Result Date: 11/05/2021 CLINICAL DATA:  Diverticulitis suspected a sepsis EXAM: CT ABDOMEN AND PELVIS WITHOUT CONTRAST TECHNIQUE: Multidetector CT imaging of the  abdomen and pelvis was performed following the standard protocol without IV contrast. COMPARISON:  CT abdomen and pelvis 07/22/2021 FINDINGS: Lower chest: Moderate size bilateral pleural effusions with adjacent basilar atelectasis. Coronary artery calcifications. Aortic valve calcifications. Hepatobiliary: No focal liver abnormality is seen. The gallbladder is likely decompressed. Pancreas: Unremarkable. No pancreatic ductal dilatation or surrounding inflammatory changes. Spleen: Normal in size without focal abnormality. Adrenals/Urinary Tract: Adrenal glands are unremarkable. The kidneys appear mildly atrophic. No hydronephrosis or nephrolithiasis. Unchanged bilateral bladder diverticula. Stomach/Bowel: The stomach is within normal limits. There is no evidence of bowel obstruction. The appendix is normal. There are multiple fluid-filled loops of small bowel throughout the abdomen without a transition point. This continues as a fluid-filled cecum and ascending colon. There is long segment inflammatory stranding along the sigmoid colon and rectum, with submucosal edema and a few diverticula present. There is also mild inflammatory stranding along the of the ascending colon and splenic flexure to a lesser degree than the rectosigmoid colon. Vascular/Lymphatic: Aortoiliac atherosclerotic calcifications. Infrarenal abdominal aortic aneurysm measuring 3.1 cm, previously 3.1 cm (series 3, image 44). This was remeasured for consistency. Reproductive: No clear abnormality on CT, though the patient reportedly has a history of prostate cancer. Other: Since the prior exam, the patient appears to have lost a significant amount of body fat. No hernia. Musculoskeletal: Prior bilateral hip fixation. There is avascular necrosis of the right femoral head with articular surface collapse, unchanged from prior exam. Mild bilateral hip osteoarthritis. Multilevel degenerative changes of the spine. No suspicious lytic or blastic lesions.  There is a sacral decubitus ulcer, with underlying sclerosis at the sacrococcygeal junction, likely reflecting osteomyelitis. This bony changes new since the prior exam. Generalized muscle atrophy. IMPRESSION: Long segment inflammatory stranding of the rectosigmoid colon and to a lesser degree at the splenic flexure and ascending colon. There are few diverticula present in the sigmoid colon. Findings are favored to represent colitis rather than diverticulitis. No focal/drainable fluid  collection. Recommend testing for C. difficile. Fluid-filled loops of small bowel, likely reflecting small-bowel enteritis as well. No evidence of bowel obstruction. Moderate-sized bilateral pleural effusions with adjacent basilar atelectasis. Sacral decubitus ulcer with underlying new sclerosis at the sacrococcygeal junction, compatible with osteomyelitis. Unchanged avascular necrosis of the right femoral head with articular surface collapse. Since the prior abdominopelvic CT in August, patient appears to have lost a significant amount of body fat. Electronically Signed   By: Maurine Simmering M.D.   On: 10/27/2021 13:22   DG Chest Port 1 View  Result Date: 10/16/2021 CLINICAL DATA:  Sepsis. EXAM: PORTABLE CHEST 1 VIEW COMPARISON:  September 17, 2021. FINDINGS: Stable cardiomediastinal silhouette. Stable probable right basilar atelectasis with small right pleural effusion. Minimal left basilar subsegmental atelectasis may be present. Bony thorax is unremarkable. IMPRESSION: Stable right basilar atelectasis with small right pleural effusion. Minimal left basilar subsegmental atelectasis is noted. Electronically Signed   By: Marijo Conception M.D.   On: 10/19/2021 08:54    EKG: Independently reviewed.  Atrial fibrillation at 87 bpm with premature ventricular complexes  Assessment/Plan C. difficile colitis: Acute.  Patient presents with reports of diarrhea.  Noted to have several liquid stools while in the ED.  He is just recently been  hospitalized for cellulitis of the right leg discharged on Augmentin to continue till 10/20.  Stools noted to be positive for C. difficile antigen and toxigenic C. difficile by PCR.  CT scan of the abdomen and pelvis significant for concern for colitis. -Admit to a a progressive bed -Follow-up blood cultures -Strict I&O's   -Rectal tube -Vancomycin capsules 4 times daily -Probiotics  Hypotension: Acute.  On admission blood pressures noted to be as low as 73/52 with temporary improvement with IV fluids.  Suspect secondary to diarrhea related to C. difficile colitis.  Patient had received 4 L of IV fluids in the ED with temporary improvement. Home blood pressure medications include Entresto 49-51 mg twice daily and amlodipine 2.5 mg daily.  Patient has a MOST form, but did make note that he was okay with pressors if needed to help maintain blood pressure. -Hold home blood pressure medication regimen -Bolus an additional liter of IV fluids, and then placed on a rate of 100 mL/h  Leukocytosis: Acute.  WBC elevated 19.1.  Lactic acid was initially reassuring at 1.1.  Suspect secondary to above. -Recheck CBC tomorrow morning  Hypokalemia: Acute.  Initial potassium as low as 2.4.  Patient has been ordered 30 mEq of potassium chloride IV. -Give additional 60 mEq of potassium chloride p.o. -Check magnesium level -Continue to monitor and replace as needed  Acute kidney injury: On admission creatinine 2.17 with BUN 45.  Previously creatinine had been 1.26 on 10/19.  The elevated BUN to creatinine ratio suggests prerenal cause of symptoms thought to be related to patient's reports of diarrhea. -Continue IV fluids as noted above -Monitor kidney function daily  Atrial fibrillation on chronic anticoagulation: Patient currently in atrial fibrillation, but rate controlled.  He was noted to have heart rates temporarily in the 40s telemetry. -Continue Eliquis -Follow-up telemetry overnight and consider need  of reconsulting cardiology if warranted  History of CHF: Chronic.  Patient's last EF was noted to have improved to 50-55% per last echo in 04/2015 from 20-25% in 2014.  He does not appear to be grossly fluid overloaded at this time. -Strict intake and output -Daily weights  Normocytic anemia: Hemoglobin 10.5 g/dL on admission, baseline hemoglobin have been previously around  9.  Suspect patient is significantly dehydrated. -Continue to monitor  Severe protein calorie malnutrition: On admission patient's albumin was noted to be low at 1.8. -Add on prealbumin -Give 25 g of albumin IV x1 dose -Continue supplements with meals  Pressure ulcer and right lower extremity wound: Patient noted to have just mild erythema without significant drainage or increased warmth. -Wound care consult  Tobacco abuse -Continue nicotine patch  GERD -Continue Protonix  DNR: PTA.   DVT prophylaxis: Eliquis Code Status: Full Family Communication: sister updated over the phone Disposition Plan: Likely need to be discharged back to nursing facility Consults called: PCCM made aware of the patient due to hypotension.  Will consult with blood pressure did not improved with IV fluids \ admission status: Inpatient, require more than 2 midnight stay due to need of aggressive IV fluid hydration in setting of C. difficile colitis with AKI Norval Morton MD [facility]   If 7PM-7AM, please contact night-coverage   10/22/2021, 1:54 PM

## 2021-10-17 NOTE — Progress Notes (Signed)
Patient arrived to 4E15 from ED.  Telemetry monitor applied and CCMD notified.  Skin assessment and CHG bath completed.

## 2021-10-18 DIAGNOSIS — A0472 Enterocolitis due to Clostridium difficile, not specified as recurrent: Secondary | ICD-10-CM | POA: Diagnosis not present

## 2021-10-18 LAB — CBC
HCT: 23.4 % — ABNORMAL LOW (ref 39.0–52.0)
Hemoglobin: 7.4 g/dL — ABNORMAL LOW (ref 13.0–17.0)
MCH: 29.7 pg (ref 26.0–34.0)
MCHC: 31.6 g/dL (ref 30.0–36.0)
MCV: 94 fL (ref 80.0–100.0)
Platelets: 196 10*3/uL (ref 150–400)
RBC: 2.49 MIL/uL — ABNORMAL LOW (ref 4.22–5.81)
RDW: 14.1 % (ref 11.5–15.5)
WBC: 12.8 10*3/uL — ABNORMAL HIGH (ref 4.0–10.5)
nRBC: 0 % (ref 0.0–0.2)

## 2021-10-18 LAB — GLUCOSE, CAPILLARY
Glucose-Capillary: 61 mg/dL — ABNORMAL LOW (ref 70–99)
Glucose-Capillary: 72 mg/dL (ref 70–99)
Glucose-Capillary: 79 mg/dL (ref 70–99)
Glucose-Capillary: 110 mg/dL — ABNORMAL HIGH (ref 70–99)
Glucose-Capillary: 105 mg/dL — ABNORMAL HIGH (ref 70–99)

## 2021-10-18 LAB — BASIC METABOLIC PANEL
Anion gap: 12 (ref 5–15)
Anion gap: 11 (ref 5–15)
BUN: 42 mg/dL — ABNORMAL HIGH (ref 8–23)
BUN: 41 mg/dL — ABNORMAL HIGH (ref 8–23)
CO2: 14 mmol/L — ABNORMAL LOW (ref 22–32)
CO2: 15 mmol/L — ABNORMAL LOW (ref 22–32)
Calcium: 7.2 mg/dL — ABNORMAL LOW (ref 8.9–10.3)
Calcium: 7.4 mg/dL — ABNORMAL LOW (ref 8.9–10.3)
Chloride: 112 mmol/L — ABNORMAL HIGH (ref 98–111)
Chloride: 114 mmol/L — ABNORMAL HIGH (ref 98–111)
Creatinine, Ser: 2.01 mg/dL — ABNORMAL HIGH (ref 0.61–1.24)
Creatinine, Ser: 2.07 mg/dL — ABNORMAL HIGH (ref 0.61–1.24)
GFR, Estimated: 32 mL/min — ABNORMAL LOW (ref >60)
GFR, Estimated: 31 mL/min — ABNORMAL LOW (ref >60)
Glucose, Bld: 56 mg/dL — ABNORMAL LOW (ref 70–99)
Glucose, Bld: 77 mg/dL (ref 70–99)
Potassium: 3 mmol/L — ABNORMAL LOW (ref 3.5–5.1)
Potassium: 3.4 mmol/L — ABNORMAL LOW (ref 3.5–5.1)
Sodium: 138 mmol/L (ref 135–145)
Sodium: 140 mmol/L (ref 135–145)

## 2021-10-18 LAB — MAGNESIUM: Magnesium: 1.5 mg/dL — ABNORMAL LOW (ref 1.7–2.4)

## 2021-10-18 MED ORDER — POTASSIUM CHLORIDE 10 MEQ/100ML IV SOLN
10.0000 meq | INTRAVENOUS | Status: AC
  Administered 2021-10-18 (×3): 10 meq via INTRAVENOUS
  Filled 2021-10-18 (×3): qty 100

## 2021-10-18 MED ORDER — MAGNESIUM SULFATE 4 GM/100ML IV SOLN
4.0000 g | Freq: Once | INTRAVENOUS | Status: AC
Start: 2021-10-18 — End: 2021-10-18
  Administered 2021-10-18: 4 g via INTRAVENOUS
  Filled 2021-10-18: qty 100

## 2021-10-18 MED ORDER — DEXTROSE-NACL 5-0.45 % IV SOLN
INTRAVENOUS | Status: DC
Start: 2021-10-18 — End: 2021-10-21

## 2021-10-18 MED ORDER — ALBUMIN HUMAN 5 % IV SOLN
25.0000 g | Freq: Once | INTRAVENOUS | Status: AC
  Administered 2021-10-18: 25 g via INTRAVENOUS
  Filled 2021-10-18: qty 500

## 2021-10-18 MED ORDER — LACTATED RINGERS IV BOLUS
500.0000 mL | Freq: Once | INTRAVENOUS | Status: AC
  Administered 2021-10-18: 500 mL via INTRAVENOUS

## 2021-10-18 MED ORDER — DEXTROSE-NACL 10-0.45 % IV SOLN
INTRAVENOUS | Status: DC
Start: 2021-10-18 — End: 2021-10-18
  Filled 2021-10-18: qty 1000

## 2021-10-18 MED ORDER — POTASSIUM CHLORIDE CRYS ER 20 MEQ PO TBCR
40.0000 meq | EXTENDED_RELEASE_TABLET | Freq: Once | ORAL | Status: AC
  Administered 2021-10-18: 40 meq via ORAL
  Filled 2021-10-18: qty 2

## 2021-10-18 NOTE — Progress Notes (Signed)
PROGRESS NOTE  Matthew Mahoney OVZ:[id number] DOB: Dec 30, 1935 DOA: 10/30/2021 PCP: [name], MD  Brief History   The patient is an 85 yr old man who presented to ED via EMS from [facility] due to concerns of sepsis after 2 weeks of loose stools. The patient had been previously admitted 09/17/2021-09/24/2021 for right lower extremity cellulitis. During that stay he was also found to have atrial fibrillation with a slow ventricular response and anemia of unknown cause. AV nodal agents were stopped. Cardiology saw no need for pacemaker. She also underwent an EGD that demonstrated a Schatzki ring, a small hiatal hernia, erosive gastritis with no signs of active bleeding. The esophagus was dilated. His eliquis was restarted. AFter returning to the facility he developed loose stools. On the day of presentation he was found to have a low blood pressure in the 70's. Lactic acid was 1.1. WBC was 19.1. K was 2.4, and magnesium was 1.1.  Stool was positive for C Diff colitis. The patient was given IV fluid resuscitation. He has been started on oral vancomycin. He has been admitted to a telemetry bed.  Consultants  None  Procedures  None  Antibiotics   Anti-infectives (From admission, onward)    Start     Dose/Rate Route Frequency Ordered Stop   10/25/2021 1445  vancomycin (VANCOCIN) capsule 125 mg        125 mg Oral 4 times daily 11/05/2021 1442 10/27/21 1359       Subjective  The patient is resting comfortably. No new complaints.   Objective   Vitals:  Vitals:   10/18/21 0800 10/18/21 1214  BP: (!) 109/56 (!) 99/54  Pulse: 80 80  Resp: (!) 22 20  Temp: (!) 97.4 F (36.3 C) 97.6 F (36.4 C)  SpO2: 97% 100%    Exam:  Constitutional:  The patient is awake, alert, and oriented x 3. No acute distress. Respiratory:  No increased work of breathing. No wheezes, rales, or rhonchi No tactile fremitus Cardiovascular:  Regular rate and rhythm No murmurs, ectopy, or  gallups. No lateral PMI. No thrills. Abdomen:  Abdomen is soft, non-tender, non-distended No hernias, masses, or organomegaly Hyperactive bowel sounds.  Musculoskeletal:  No cyanosis, clubbing, or edema Skin:  No rashes, lesions, ulcers palpation of skin: no induration or nodules Unstagable pressure ulcer on sacrum Neurologic:  CN 2-12 intact Sensation all 4 extremities intact Psychiatric:  Mental status Mood, affect appropriate Orientation to person, place, time  judgment and insight appear intact  I have personally reviewed the following:   Today's Data  Vitals  Lab Data  BMP, CBC  Micro Data  Stool positive for C Diff.  Imaging  CT abdomend and pelvis  Cardiology Data  EKG  Scheduled Meds:  apixaban  2.5 mg Oral BID   atorvastatin  40 mg Oral QPM   feeding supplement  237 mL Oral BID BM   fluticasone furoate-vilanterol  1 puff Inhalation Daily   guaiFENesin  600 mg Oral BID   latanoprost  1 drop Both Eyes QHS   multivitamin  1 tablet Oral Daily   nutrition supplement (JUVEN)  1 packet Oral BID BM   pantoprazole  40 mg Oral Daily   potassium chloride  40 mEq Oral Once   saccharomyces boulardii  250 mg Oral BID   sodium chloride flush  3 mL Intravenous Q12H   thiamine  100 mg Oral Daily   umeclidinium bromide  1 puff Inhalation Daily   vancomycin  125 mg Oral QID   Continuous Infusions:  dextrose 10 % and 0.45 % NaCl 50 mL/hr at 10/18/21 0717   dextrose 5 % and 0.45% NaCl     magnesium sulfate bolus IVPB      Principal Problem:   C. difficile colitis Active Problems:   Tobacco abuse   Pressure injury of skin   Protein-calorie malnutrition, severe   Hypotension   Leukocytosis   Hypokalemia   History of CHF (congestive heart failure)   LOS: 1 day   A & P  C. difficile colitis: Acute.  Patient presents with reports of diarrhea.  Noted to have several liquid stools while in the ED.  He is just recently been hospitalized for cellulitis of the  right leg discharged on Augmentin to continue till 10/20.  Stools noted to be positive for C. difficile antigen and toxigenic C. difficile by PCR.  CT scan of the abdomen and pelvis significant for concern for colitis. -Admit to a a progressive bed -Blood cultures have had no growth -Strict I&O's   -Rectal tube -Vancomycin capsules 4 times daily -Probiotics   Hypotension: Acute.  Improved, but still present. Continue IV fluids. On admission blood pressures noted to be as low as 73/52 with temporary improvement with IV fluids.  Suspect secondary to diarrhea related to C. difficile colitis.  Patient had received 4 L of IV fluids in the ED with temporary improvement. Home blood pressure medications include Entresto 49-51 mg twice daily and amlodipine 2.5 mg daily.  Patient has a MOST form, but did make note that he was okay with pressors if needed to help maintain blood pressure. -Hold home blood pressure medication regimen - Plan to give another bolus of LR.   Leukocytosis: Acute.  WBC elevated 19.1 on presentation. No to 12.8. Lactic acid was initially reassuring at 1.1.  Suspect secondary to above. -Recheck CBC tomorrow morning   Hypokalemia: Acute.  Initial potassium as low as 2.4.  3.4 this morning. Supplement.   Hypomagnesemia: magnesium 1.1 on presentation. He has received 20 g of Mg. Repeat level is 1.5. He will be given 4 g more.   Acute kidney injury: On admission creatinine 2.17 with BUN 45 on admission. Creatinine is down to 2.07 this morning.  Previously creatinine had been 1.26 on 10/19.  The elevated BUN to creatinine ratio suggests prerenal cause of symptoms thought to be related to patient's reports of diarrhea. -Continue IV fluids as noted above -Monitor creatinine, electrolytes, and volume status. Avoid nephrotoxins and hypotension.  Atrial fibrillation on chronic anticoagulation: Patient currently in atrial fibrillation, but rate controlled.  He was noted to have heart rates  temporarily in the 40s telemetry. -Continue Eliquis -Follow-up telemetry overnight and consider need of reconsulting cardiology if warranted   History of CHF: Chronic.  Patient's last EF was noted to have improved to 50-55% per last echo in 04/2015 from 20-25% in 2014.  He does not appear to be grossly fluid overloaded at this time. -Strict intake and output -Daily weights   Normocytic anemia: Hemoglobin 10.5 g/dL on admission, baseline hemoglobin have been previously around 9.  Repeat hemoglobin is 7.4. Monitor and transfuse for hemoglobin less than 7.0. Suspect patient was significantly dehydrated. -Continue to monitor   Severe protein calorie malnutrition: On admission patient's albumin was noted to be low at 1.8. Prealbumin found to be less than 5. Consult nutrition.   Pressure ulcer and right lower extremity wound: Patient noted to have just mild erythema without significant drainage  or increased warmth. -Wound care consult   Tobacco abuse -Continue nicotine patch   GERD -Continue Protonix  I have seen and examined this patient myself. I have spent 34 minutes in his evaluation and care.   DNR: Modified DNR: Patient and family are amenable to pressure support agents if necessary.  DVT prophylaxis: Eliquis Code Status: Modified DNR: Patient and family are amenable to pressure support agents if necessary. Family Communication:  None available Disposition Plan: Likely need to be discharged back to nursing facility   [place], DO [facility] Direct contact: see [email or web address]  7PM-7AM contact night coverage as above 10/18/2021, 2:25 PM  LOS: 1 day

## 2021-10-18 NOTE — Progress Notes (Signed)
Dr. Bridgett Larsson notified about pt's dropping BP. Pt is A/O x3 and easily arousable. New orders received to give pt Albumin infusion see EMAR for details.

## 2021-10-18 NOTE — Progress Notes (Signed)
Pt glucose on am labs 56. Pt agreed to drink milk only. Checked CBG result of 61. Dr. Bridgett Larsson notified about results of CBG and Potassium of 3.0. New orders received to treat pt see EMAR for details.

## 2021-10-19 DIAGNOSIS — A0472 Enterocolitis due to Clostridium difficile, not specified as recurrent: Secondary | ICD-10-CM | POA: Diagnosis not present

## 2021-10-19 LAB — CBC WITH DIFFERENTIAL/PLATELET
Abs Immature Granulocytes: 0.08 10*3/uL — ABNORMAL HIGH (ref 0.00–0.07)
Basophils Absolute: 0 10*3/uL (ref 0.0–0.1)
Basophils Relative: 0 %
Eosinophils Absolute: 0.7 10*3/uL — ABNORMAL HIGH (ref 0.0–0.5)
Eosinophils Relative: 7 %
HCT: 25.6 % — ABNORMAL LOW (ref 39.0–52.0)
Hemoglobin: 8.1 g/dL — ABNORMAL LOW (ref 13.0–17.0)
Immature Granulocytes: 1 %
Lymphocytes Relative: 20 %
Lymphs Abs: 2.2 10*3/uL (ref 0.7–4.0)
MCH: 29.9 pg (ref 26.0–34.0)
MCHC: 31.6 g/dL (ref 30.0–36.0)
MCV: 94.5 fL (ref 80.0–100.0)
Monocytes Absolute: 0.5 10*3/uL (ref 0.1–1.0)
Monocytes Relative: 5 %
Neutro Abs: 7.4 10*3/uL (ref 1.7–7.7)
Neutrophils Relative %: 67 %
Platelets: 205 10*3/uL (ref 150–400)
RBC: 2.71 MIL/uL — ABNORMAL LOW (ref 4.22–5.81)
RDW: 14.4 % (ref 11.5–15.5)
WBC: 10.9 10*3/uL — ABNORMAL HIGH (ref 4.0–10.5)
nRBC: 0 % (ref 0.0–0.2)

## 2021-10-19 LAB — GLUCOSE, CAPILLARY
Glucose-Capillary: 113 mg/dL — ABNORMAL HIGH (ref 70–99)
Glucose-Capillary: 109 mg/dL — ABNORMAL HIGH (ref 70–99)
Glucose-Capillary: 113 mg/dL — ABNORMAL HIGH (ref 70–99)
Glucose-Capillary: 117 mg/dL — ABNORMAL HIGH (ref 70–99)

## 2021-10-19 LAB — BASIC METABOLIC PANEL
Anion gap: 7 (ref 5–15)
BUN: 36 mg/dL — ABNORMAL HIGH (ref 8–23)
CO2: 18 mmol/L — ABNORMAL LOW (ref 22–32)
Calcium: 7.4 mg/dL — ABNORMAL LOW (ref 8.9–10.3)
Chloride: 111 mmol/L (ref 98–111)
Creatinine, Ser: 1.74 mg/dL — ABNORMAL HIGH (ref 0.61–1.24)
GFR, Estimated: 38 mL/min — ABNORMAL LOW (ref >60)
Glucose, Bld: 119 mg/dL — ABNORMAL HIGH (ref 70–99)
Potassium: 3.5 mmol/L (ref 3.5–5.1)
Sodium: 136 mmol/L (ref 135–145)

## 2021-10-19 LAB — MAGNESIUM: Magnesium: 2.1 mg/dL (ref 1.7–2.4)

## 2021-10-19 LAB — PHOSPHORUS: Phosphorus: 2.7 mg/dL (ref 2.5–4.6)

## 2021-10-19 MED ORDER — COLLAGENASE 250 UNIT/GM EX OINT
TOPICAL_OINTMENT | Freq: Every day | CUTANEOUS | Status: DC
  Filled 2021-10-19: qty 30

## 2021-10-19 NOTE — Plan of Care (Signed)
  Problem: Education: Goal: Knowledge of General Education information will improve Description Including pain rating scale, medication(s)/side effects and non-pharmacologic comfort measures Outcome: Progressing   

## 2021-10-19 NOTE — Progress Notes (Signed)
PROGRESS NOTE  Matthew HEGEMAN Mahoney:[id number] DOB: 17-Aug-1936 DOA: 10/29/2021 PCP: [name], MD  Brief History   The patient is an 85 yr old man who presented to ED via EMS from [facility] and rehab due to concerns of sepsis after 2 weeks of loose stools. The patient had been previously admitted 09/17/2021-09/24/2021 for right lower extremity cellulitis. During that stay he was also found to have atrial fibrillation with a slow ventricular response and anemia of unknown cause. AV nodal agents were stopped. Cardiology saw no need for pacemaker. She also underwent an EGD that demonstrated a Schatzki ring, a small hiatal hernia, erosive gastritis with no signs of active bleeding. The esophagus was dilated. His eliquis was restarted. AFter returning to the facility he developed loose stools. On the day of presentation he was found to have a low blood pressure in the 70's. Lactic acid was 1.1. WBC was 19.1. K was 2.4, and magnesium was 1.1.  Stool was positive for C Diff colitis. The patient was given IV fluid resuscitation. He has been started on oral vancomycin. He has been admitted to a telemetry bed.  Consultants  None  Procedures  None  Antibiotics   Anti-infectives (From admission, onward)    Start     Dose/Rate Route Frequency Ordered Stop   10/08/2021 1445  vancomycin (VANCOCIN) capsule 125 mg        125 mg Oral 4 times daily 10/13/2021 1442 10/27/21 1359       Subjective  The patient is resting comfortably. No new complaints.   Objective   Vitals:  Vitals:   10/19/21 0815 10/19/21 0858  BP: 107/62   Pulse: 62   Resp: 20   Temp: (!) 97.3 F (36.3 C)   SpO2: 95% 96%    Exam:  Constitutional:  The patient is awake, alert, and oriented x 3. No acute distress. Respiratory:  No increased work of breathing. No wheezes, rales, or rhonchi No tactile fremitus Cardiovascular:  Regular rate and rhythm No murmurs, ectopy, or gallups. No lateral PMI. No  thrills. Abdomen:  Abdomen is soft, non-tender, non-distended No hernias, masses, or organomegaly Hyperactive bowel sounds.  Musculoskeletal:  No cyanosis, clubbing, or edema Skin:  No rashes, lesions, ulcers palpation of skin: no induration or nodules Unstagable pressure ulcer on sacrum Neurologic:  CN 2-12 intact Sensation all 4 extremities intact Psychiatric:  Mental status Mood, affect appropriate Orientation to person, place, time  judgment and insight appear intact  I have personally reviewed the following:   Today's Data  Vitals  Lab Data  BMP, CBC  Micro Data  Stool positive for C Diff.  Imaging  CT abdomend and pelvis  Cardiology Data  EKG  Scheduled Meds:  apixaban  2.5 mg Oral BID   atorvastatin  40 mg Oral QPM   feeding supplement  237 mL Oral BID BM   fluticasone furoate-vilanterol  1 puff Inhalation Daily   guaiFENesin  600 mg Oral BID   latanoprost  1 drop Both Eyes QHS   multivitamin  1 tablet Oral Daily   nutrition supplement (JUVEN)  1 packet Oral BID BM   pantoprazole  40 mg Oral Daily   saccharomyces boulardii  250 mg Oral BID   sodium chloride flush  3 mL Intravenous Q12H   thiamine  100 mg Oral Daily   umeclidinium bromide  1 puff Inhalation Daily   vancomycin  125 mg Oral QID   Continuous Infusions:  dextrose 5 % and 0.45%  NaCl 75 mL/hr at 10/19/21 0407    Principal Problem:   C. difficile colitis Active Problems:   Tobacco abuse   Pressure injury of skin   Protein-calorie malnutrition, severe   Hypotension   Leukocytosis   Hypokalemia   History of CHF (congestive heart failure)   LOS: 2 days   A & P  C. difficile colitis: Acute.  Patient presents with reports of diarrhea.  Noted to have several liquid stools while in the ED.  He is just recently been hospitalized for cellulitis of the right leg discharged on Augmentin to continue till 10/20.  Stools noted to be positive for C. difficile antigen and toxigenic C. difficile  by PCR.  CT scan of the abdomen and pelvis significant for concern for colitis. -Admit to a a progressive bed -Blood cultures have had no growth -Strict I&O's. Patient is positive 6.5 Liters in terms of fluid balance. -Rectal tube -Vancomycin capsules 4 times daily -Probiotics   Hypotension: Acute. Due to GI losses. Resolved with IV fluids. Wean IV fluids. On admission blood pressures noted to be as low as 73/52 with temporary improvement with IV fluids.  Suspect secondary to diarrhea related to C. difficile colitis.  Patient had received 4 L of IV fluids in the ED with temporary improvement. Home blood pressure medications include Entresto 49-51 mg twice daily and amlodipine 2.5 mg daily. -Hold home blood pressure medication regimen - Plan to give another bolus of LR. Monitor   Leukocytosis: Acute. Resolved. WBC elevated 19.1 on presentation. No to 12.8. Lactic acid was initially reassuring at 1.1.  Suspect secondary to above. -Monitor   Hypokalemia: Acute.  Initial potassium as low as 2.4.  3.5. this morning. Supplement.   Hypomagnesemia: magnesium 1.1 on presentation. He has received 20 g of Mg. Repeat level is 1.5. He will be given 4 g more.   Acute kidney injury: On admission creatinine 2.17 with BUN 45 on admission. Creatinine is down to 2.07 this morning.  Previously creatinine had been 1.26 on 10/19.  The elevated BUN to creatinine ratio suggests prerenal cause of symptoms thought to be related to patient's reports of diarrhea. -Continue IV fluids as noted above -Monitor creatinine, electrolytes, and volume status. Avoid nephrotoxins and hypotension.  Atrial fibrillation on chronic anticoagulation: Patient currently in atrial fibrillation, but rate controlled.  He was noted to have heart rates temporarily in the 40s telemetry. -Continue Eliquis -Rate is controlled.   History of CHF: Chronic.  Patient's last EF was noted to have improved to 50-55% per last echo in 04/2015 from  20-25% in 2014.  He does not appear to be grossly fluid overloaded at this time. -Strict intake and output -Daily weights   Normocytic anemia: Hemoglobin 10.5 g/dL on admission, baseline hemoglobin have been previously around 9.  Repeat hemoglobin is 7.4. Monitor and transfuse for hemoglobin less than 7.0. Suspect patient was significantly dehydrated. -Continue to monitor   Severe protein calorie malnutrition: On admission patient's albumin was noted to be low at 1.8. Prealbumin found to be less than 5. Consult nutrition.   Pressure ulcer and right lower extremity wound: Patient noted to have just mild erythema without significant drainage or increased warmth. -Wound care consult   Tobacco abuse -Continue nicotine patch   GERD -Continue Protonix  I have seen and examined this patient myself. I have spent 32 minutes in his evaluation and care.   DNR: Modified DNR: Patient and family are amenable to pressure support agents if necessary.  DVT  prophylaxis: Eliquis Code Status: Modified DNR: Patient and family are amenable to pressure support agents if necessary. Family Communication:  None available Disposition Plan: Likely need to be discharged back to nursing facility   [place], DO [facility] Direct contact: see [email or web address]  7PM-7AM contact night coverage as above 10/19/2021, 1:16 PM  LOS: 1 day

## 2021-10-19 NOTE — Consult Note (Addendum)
[place] Nurse Consult Note: Reason for Consult:Patient recently in house and is readmitted from a long term care facility, [facility]. Unstageable pressure injury to sacrum, chronic healing wounds on right LE, bilateral heels with skin injury, L>R. Past admission here was 09/17/21-09/24/21. Wound type:Pressure Pressure Injury POA: Yes Measurement:Per Nursing flow sheet. Nursing is requested to measure right LE wounds and Left heel wound today and document on Nursing flow sheet. Wound bed:Per Nursing Flow Sheet and below. Drainage (amount, consistency, odor) small amount from sacral wound, none from heels, small from R LE Periwound: intact, dry Dressing procedure/placement/frequency: Pressure redistribution heel boots and a pressure redistribution chair cushion are provided and will accompany the patient to the disposition setting post discharge.  Turning and repositioning are in pace and topical wound care guidance is provided for the sacral Unstageable pressure injury that is 50% nonviable eschar, 25% yellow slough and 25% red tissue using and enzymatic debriding agent, collagenase (Santyl), daily. A mattress replacement is ordered today for use in house. The right LE wounds are red, moist and with small amount of serous exudate. They are chronic and the treatment plan established by the Provider is one with which I agree: daily cleansing followed by placement of a xeroform gauze topped with a silicone foam.This is to be followed by wrapping the LE with Kerlix roll gauze from just below the toes to just below the knee and secured with tape, then topped with an ACE bandage wrapped in a similar manner.  Foot/feet are to be placed into Prevalon pressure redistribution heel boots. The left heel has a DTPI and pressure redistribution heel boots are provided as mentioned above. The patient continues to smoke and this presents deleterious conditions for tissue repair and regeneration.  A referral to a registered  dietician is recommended and I believe is mentioned in the note written by Dr. Benny Lennert today.  [facility] nursing team will not follow, but will remain available to this patient, the nursing and medical teams.  Please re-consult if needed. Thanks, Maudie Flakes, MSN, RN, [place], Arther Abbott  Pager# [phone]

## 2021-10-20 DIAGNOSIS — A0472 Enterocolitis due to Clostridium difficile, not specified as recurrent: Secondary | ICD-10-CM | POA: Diagnosis not present

## 2021-10-20 LAB — CBC WITH DIFFERENTIAL/PLATELET
Abs Immature Granulocytes: 0.09 10*3/uL — ABNORMAL HIGH (ref 0.00–0.07)
Basophils Absolute: 0.1 10*3/uL (ref 0.0–0.1)
Basophils Relative: 1 %
Eosinophils Absolute: 0.5 10*3/uL (ref 0.0–0.5)
Eosinophils Relative: 5 %
HCT: 27.5 % — ABNORMAL LOW (ref 39.0–52.0)
Hemoglobin: 8.9 g/dL — ABNORMAL LOW (ref 13.0–17.0)
Immature Granulocytes: 1 %
Lymphocytes Relative: 19 %
Lymphs Abs: 1.9 10*3/uL (ref 0.7–4.0)
MCH: 29.8 pg (ref 26.0–34.0)
MCHC: 32.4 g/dL (ref 30.0–36.0)
MCV: 92 fL (ref 80.0–100.0)
Monocytes Absolute: 0.7 10*3/uL (ref 0.1–1.0)
Monocytes Relative: 7 %
Neutro Abs: 6.7 10*3/uL (ref 1.7–7.7)
Neutrophils Relative %: 67 %
Platelets: 242 10*3/uL (ref 150–400)
RBC: 2.99 MIL/uL — ABNORMAL LOW (ref 4.22–5.81)
RDW: 14.2 % (ref 11.5–15.5)
WBC: 10 10*3/uL (ref 4.0–10.5)
nRBC: 0 % (ref 0.0–0.2)

## 2021-10-20 LAB — COMPREHENSIVE METABOLIC PANEL
ALT: 9 U/L (ref 0–44)
AST: 14 U/L — ABNORMAL LOW (ref 15–41)
Albumin: 2.2 g/dL — ABNORMAL LOW (ref 3.5–5.0)
Alkaline Phosphatase: 61 U/L (ref 38–126)
Anion gap: 5 (ref 5–15)
BUN: 33 mg/dL — ABNORMAL HIGH (ref 8–23)
CO2: 19 mmol/L — ABNORMAL LOW (ref 22–32)
Calcium: 7.7 mg/dL — ABNORMAL LOW (ref 8.9–10.3)
Chloride: 114 mmol/L — ABNORMAL HIGH (ref 98–111)
Creatinine, Ser: 1.64 mg/dL — ABNORMAL HIGH (ref 0.61–1.24)
GFR, Estimated: 41 mL/min — ABNORMAL LOW (ref >60)
Glucose, Bld: 103 mg/dL — ABNORMAL HIGH (ref 70–99)
Potassium: 3.3 mmol/L — ABNORMAL LOW (ref 3.5–5.1)
Sodium: 138 mmol/L (ref 135–145)
Total Bilirubin: 0.7 mg/dL (ref 0.3–1.2)
Total Protein: 5 g/dL — ABNORMAL LOW (ref 6.5–8.1)

## 2021-10-20 LAB — GLUCOSE, CAPILLARY
Glucose-Capillary: 101 mg/dL — ABNORMAL HIGH (ref 70–99)
Glucose-Capillary: 111 mg/dL — ABNORMAL HIGH (ref 70–99)
Glucose-Capillary: 126 mg/dL — ABNORMAL HIGH (ref 70–99)

## 2021-10-20 MED ORDER — POTASSIUM CHLORIDE CRYS ER 20 MEQ PO TBCR
40.0000 meq | EXTENDED_RELEASE_TABLET | Freq: Once | ORAL | Status: AC
  Administered 2021-10-20: 40 meq via ORAL
  Filled 2021-10-20: qty 2

## 2021-10-20 MED ORDER — LORAZEPAM 2 MG/ML IJ SOLN
1.0000 mg | INTRAMUSCULAR | Status: DC | PRN
  Administered 2021-10-20: 1 mg via INTRAVENOUS
  Filled 2021-10-20 (×2): qty 1

## 2021-10-20 NOTE — Discharge Instructions (Signed)

## 2021-10-20 NOTE — Progress Notes (Signed)
Pt is appearing quite drowsy after getting Ativan at 16:19 because of agitation and pulling devices per RN day shift reported. He responds to voice at this moment, opens his eyes and falls back to sleep simultaneously. He does not follow commands. His vital signs remain stable. He is unable to take oral medicine at this time due to high risk of aspiration. We will try again later.  Kennyth Lose, RN

## 2021-10-20 NOTE — Progress Notes (Signed)
PROGRESS NOTE  Matthew Mahoney OMV:[id number] DOB: [birth year] DOA: 10/15/2021 PCP: [name], MD  Brief History   The patient is an 85 yr old man who presented to ED via EMS from [facility] due to concerns of sepsis after 2 weeks of loose stools. The patient had been previously admitted 09/17/2021-09/24/2021 for right lower extremity cellulitis. During that stay he was also found to have atrial fibrillation with a slow ventricular response and anemia of unknown cause. AV nodal agents were stopped. Cardiology saw no need for pacemaker. She also underwent an EGD that demonstrated a Schatzki ring, a small hiatal hernia, erosive gastritis with no signs of active bleeding. The esophagus was dilated. His eliquis was restarted. AFter returning to the facility he developed loose stools. On the day of presentation he was found to have a low blood pressure in the 70's. Lactic acid was 1.1. WBC was 19.1. K was 2.4, and magnesium was 1.1.  Stool was positive for C Diff colitis. The patient was given IV fluid resuscitation. He has been started on oral vancomycin. He has been admitted to a telemetry bed.  Consultants  Wound Care  Procedures  None  Antibiotics   Anti-infectives (From admission, onward)    Start     Dose/Rate Route Frequency Ordered Stop   10/25/2021 1445  vancomycin (VANCOCIN) capsule 125 mg        125 mg Oral 4 times daily 10/29/2021 1442 10/27/21 1359       Subjective  The patient is resting comfortably. No new complaints.   Objective   Vitals:  Vitals:   10/20/21 1210 10/20/21 1500  BP: (!) 91/59 124/77  Pulse: 99 71  Resp: 19 19  Temp: (!) 97.3 F (36.3 C) (!) 97.5 F (36.4 C)  SpO2: 93% 94%    Exam:  Constitutional:  The patient is awake, alert, and oriented x 3. No acute distress. Respiratory:  No increased work of breathing. No wheezes, rales, or rhonchi No tactile fremitus Cardiovascular:  Regular rate and rhythm No murmurs, ectopy, or  gallups. No lateral PMI. No thrills. Abdomen:  Abdomen is soft, non-tender, non-distended No hernias, masses, or organomegaly Hyperactive bowel sounds.  Musculoskeletal:  No cyanosis, clubbing, or edema Skin:  No rashes, lesions, ulcers palpation of skin: no induration or nodules Unstagable pressure ulcer on sacrum Neurologic:  CN 2-12 intact Sensation all 4 extremities intact Psychiatric:  Mental status Mood, affect appropriate Orientation to person, place, time  judgment and insight appear intact  I have personally reviewed the following:   Today's Data  Vitals  Lab Data  BMP, CBC  Micro Data  Stool positive for C Diff.  Imaging  CT abdomend and pelvis  Cardiology Data  EKG  Scheduled Meds:  apixaban  2.5 mg Oral BID   atorvastatin  40 mg Oral QPM   collagenase   Topical Daily   feeding supplement  237 mL Oral BID BM   fluticasone furoate-vilanterol  1 puff Inhalation Daily   guaiFENesin  600 mg Oral BID   latanoprost  1 drop Both Eyes QHS   multivitamin  1 tablet Oral Daily   nutrition supplement (JUVEN)  1 packet Oral BID BM   pantoprazole  40 mg Oral Daily   saccharomyces boulardii  250 mg Oral BID   sodium chloride flush  3 mL Intravenous Q12H   thiamine  100 mg Oral Daily   umeclidinium bromide  1 puff Inhalation Daily   vancomycin  125  mg Oral QID   Continuous Infusions:  dextrose 5 % and 0.45% NaCl 75 mL/hr at 10/20/21 1628    Principal Problem:   C. difficile colitis Active Problems:   Tobacco abuse   Pressure injury of skin   Protein-calorie malnutrition, severe   Hypotension   Leukocytosis   Hypokalemia   History of CHF (congestive heart failure)   LOS: 3 days   A & P  C. difficile colitis: Acute.  Patient presents with reports of diarrhea.  Noted to have several liquid stools while in the ED.  He is just recently been hospitalized for cellulitis of the right leg discharged on Augmentin to continue till 10/20.  Stools noted to be  positive for C. difficile antigen and toxigenic C. difficile by PCR.  CT scan of the abdomen and pelvis significant for concern for colitis. -Admit to a a progressive bed -Blood cultures have had no growth -Strict I&O's. Patient is positive 7.5 Liters in terms of fluid balance, although stool volume from rectal tube is not being measured. -Rectal tube still with apparently copious output. Start cholestyramine to reduce volume of output. -Vancomycin capsules 4 times daily -Probiotics   Hypotension: Acute. Resolved. Due to GI losses. Resolved with IV fluids. Wean IV fluids. On admission blood pressures noted to be as low as 73/52 with temporary improvement with IV fluids.  Suspect secondary to diarrhea related to C. difficile colitis.  Patient had received 4 L of IV fluids in the ED with temporary improvement. Home blood pressure medications include Entresto 49-51 mg twice daily and amlodipine 2.5 mg daily. -Hold home blood pressure medication regimen Monitor   Leukocytosis: Acute. Resolved. WBC elevated 19.1 on presentation. No to 12.8. Lactic acid was initially reassuring at 1.1.  Suspect secondary to above. -Monitor   Hypokalemia: Acute. Resolved. Initial potassium as low as 2.4.  3.3. this morning. Supplement. f  Hypomagnesemia: magnesium 1.1 on presentation. He has received 20 g of Mg. Repeat level is 1.5. He will be given 4 g more.   Acute kidney injury: On admission creatinine 2.17 with BUN 45 on admission. Creatinine is down to 1.64 this morning.  Previously creatinine had been 1.26 on 10/19.  The elevated BUN to creatinine ratio suggests prerenal cause of symptoms thought to be related to patient's reports of diarrhea. -Continue IV fluids as noted above -Monitor creatinine, electrolytes, and volume status. Avoid nephrotoxins and hypotension.  Atrial fibrillation on chronic anticoagulation: Patient currently in atrial fibrillation, but rate controlled.  He was noted to have heart rates  temporarily in the 40s telemetry. -Continue Eliquis -Rate is controlled.   History of CHF: Chronic.  Patient's last EF was noted to have improved to 50-55% per last echo in 04/2015 from 20-25% in 2014.  He does not appear to be grossly fluid overloaded at this time. -Strict intake and output -Daily weights   Normocytic anemia: Hemoglobin 10.5 g/dL on admission, baseline hemoglobin have been previously around 9.  Repeat hemoglobin is 7.4. Hemoglobin is 8.9. Monitor and transfuse for hemoglobin less than 7.0. Suspect patient was significantly dehydrated. -Continue to monitor   Severe protein calorie malnutrition: On admission patient's albumin was noted to be low at 1.8. Prealbumin found to be less than 5. Consult nutrition.   Pressure ulcer and right lower extremity wound: Patient noted to have just mild erythema without significant drainage or increased warmth. -Wound care consult   Tobacco abuse -Continue nicotine patch   GERD -Continue Protonix  I have seen and examined  this patient myself. I have spent 34 minutes in his evaluation and care.   DNR: Modified DNR: Patient and family are amenable to pressure support agents if necessary.  DVT prophylaxis: Eliquis Code Status: Modified DNR: Patient and family are amenable to pressure support agents if necessary. Family Communication:  None available Disposition Plan: Likely need to be discharged back to nursing facility  [place], DO [facility] Direct contact: see [email or web address]  7PM-7AM contact night coverage as above 10/20/2021, 17:26 PM  LOS: 1 day

## 2021-10-21 ENCOUNTER — Inpatient Hospital Stay (HOSPITAL_COMMUNITY): Payer: Medicare Other

## 2021-10-21 DIAGNOSIS — J90 Pleural effusion, not elsewhere classified: Secondary | ICD-10-CM

## 2021-10-21 DIAGNOSIS — L8990 Pressure ulcer of unspecified site, unspecified stage: Secondary | ICD-10-CM | POA: Diagnosis not present

## 2021-10-21 DIAGNOSIS — J9601 Acute respiratory failure with hypoxia: Secondary | ICD-10-CM

## 2021-10-21 DIAGNOSIS — Z8679 Personal history of other diseases of the circulatory system: Secondary | ICD-10-CM | POA: Diagnosis not present

## 2021-10-21 DIAGNOSIS — Z7189 Other specified counseling: Secondary | ICD-10-CM

## 2021-10-21 DIAGNOSIS — A0472 Enterocolitis due to Clostridium difficile, not specified as recurrent: Principal | ICD-10-CM

## 2021-10-21 DIAGNOSIS — Z515 Encounter for palliative care: Secondary | ICD-10-CM

## 2021-10-21 LAB — BASIC METABOLIC PANEL
Anion gap: 5 (ref 5–15)
BUN: 37 mg/dL — ABNORMAL HIGH (ref 8–23)
CO2: 20 mmol/L — ABNORMAL LOW (ref 22–32)
Calcium: 7.9 mg/dL — ABNORMAL LOW (ref 8.9–10.3)
Chloride: 111 mmol/L (ref 98–111)
Creatinine, Ser: 1.45 mg/dL — ABNORMAL HIGH (ref 0.61–1.24)
GFR, Estimated: 48 mL/min — ABNORMAL LOW (ref >60)
Glucose, Bld: 110 mg/dL — ABNORMAL HIGH (ref 70–99)
Potassium: 4.1 mmol/L (ref 3.5–5.1)
Sodium: 136 mmol/L (ref 135–145)

## 2021-10-21 LAB — CBC WITH DIFFERENTIAL/PLATELET
Abs Immature Granulocytes: 0.12 10*3/uL — ABNORMAL HIGH (ref 0.00–0.07)
Basophils Absolute: 0.1 10*3/uL (ref 0.0–0.1)
Basophils Relative: 0 %
Eosinophils Absolute: 0.1 10*3/uL (ref 0.0–0.5)
Eosinophils Relative: 1 %
HCT: 31.4 % — ABNORMAL LOW (ref 39.0–52.0)
Hemoglobin: 9.7 g/dL — ABNORMAL LOW (ref 13.0–17.0)
Immature Granulocytes: 1 %
Lymphocytes Relative: 9 %
Lymphs Abs: 1.3 10*3/uL (ref 0.7–4.0)
MCH: 29.2 pg (ref 26.0–34.0)
MCHC: 30.9 g/dL (ref 30.0–36.0)
MCV: 94.6 fL (ref 80.0–100.0)
Monocytes Absolute: 0.7 10*3/uL (ref 0.1–1.0)
Monocytes Relative: 6 %
Neutro Abs: 11.2 10*3/uL — ABNORMAL HIGH (ref 1.7–7.7)
Neutrophils Relative %: 83 %
Platelets: 278 10*3/uL (ref 150–400)
RBC: 3.32 MIL/uL — ABNORMAL LOW (ref 4.22–5.81)
RDW: 14.4 % (ref 11.5–15.5)
WBC: 13.5 10*3/uL — ABNORMAL HIGH (ref 4.0–10.5)
nRBC: 0 % (ref 0.0–0.2)

## 2021-10-21 LAB — BLOOD GAS, ARTERIAL
Acid-base deficit: 8.2 mmol/L — ABNORMAL HIGH (ref 0.0–2.0)
Bicarbonate: 17.6 mmol/L — ABNORMAL LOW (ref 20.0–28.0)
Drawn by: 34560
FIO2: 55
O2 Saturation: 92.8 %
Patient temperature: 36.5
pCO2 arterial: 40 mmHg (ref 32.0–48.0)
pH, Arterial: 7.264 — ABNORMAL LOW (ref 7.350–7.450)
pO2, Arterial: 70.8 mmHg — ABNORMAL LOW (ref 83.0–108.0)

## 2021-10-21 LAB — PROCALCITONIN: Procalcitonin: 0.13 ng/mL

## 2021-10-21 MED ORDER — FUROSEMIDE 10 MG/ML IJ SOLN
20.0000 mg | Freq: Once | INTRAMUSCULAR | Status: AC
  Administered 2021-10-21: 20 mg via INTRAVENOUS
  Filled 2021-10-21: qty 2

## 2021-10-21 MED ORDER — FUROSEMIDE 10 MG/ML IJ SOLN
40.0000 mg | Freq: Two times a day (BID) | INTRAMUSCULAR | Status: DC
Start: 2021-10-21 — End: 2021-10-22
  Administered 2021-10-21: 40 mg via INTRAVENOUS
  Filled 2021-10-21 (×2): qty 4

## 2021-10-21 NOTE — Consult Note (Signed)
NAME:  Matthew Mahoney, MRN:  [id number], DOB:  11/09/1936, LOS: 4 ADMISSION DATE:  10/28/2021, CONSULTATION DATE: 08/21/2021 REFERRING MD: [facility], CHIEF COMPLAINT: Bilateral pleural effusions  History of Present Illness:  85 year old nursing home patient with unstageable sacral wounds plethora of health issues that are well documented below who presented with a new diagnosis of C. difficile colitis he has extensive cardiac history including coronary artery disease mitral regurgitation nonsustained ventricular tachycardia.  On admission he was hypotensive and treated with fluids.  He developed large bilateral pleural effusions almost overnight of note he has an albumin of 1.8.  Diuresis been attempted with 20 mg of Lasix.  Interventional radiology did not want to do thoracentesis due to his being unstable.  Pulmonary critical care called to the bedside for for possible thoracentesis.  We have stopped his Eliquis at this time.  We will attempt diuresis as tolerated.  And plan on doing thoracentesis 10/22/2021 if still indicated.  Pertinent  Medical History   Past Medical History:  Diagnosis Date   Alcohol abuse 01/21/2013   Ascites    HISTORY   CAD in native artery    Choledocholithiasis    Chronic combined systolic and diastolic congestive heart failure ([place]) 05/06/2015   Chronic kidney disease, stage 3b (HCC)    Conduction disorder of the heart 09/25/2013   COPD (chronic obstructive pulmonary disease) (HCC)    CVA (cerebral infarction) 09/19/2013   GERD (gastroesophageal reflux disease) 10/11/2013   GI bleed    a. 03/2013 a/w hemorrhagic shock - EGD on 03/1013, which revealed active bleeding from proximal duodenum, with complete hemostasis following epinephrine injection therapy. At least 2 duodenal ulcers identified.   Hemorrhagic shock ([place]) 03/16/2013   History of colonic diverticulitis    Hyperkalemia    Hyperlipidemia    Hypertension    Hypocalcemia    Ichthyosis    Lymphadenopathy     Mild carotid artery disease (HCC)    Mitral regurgitation    Nonsustained ventricular tachycardia 05/09/2015   Olecranon bursitis    Onychomycosis    PAD (peripheral artery disease) (HCC)    a. suspected by noninvasive testing -> conservative rx.   Permanent atrial fibrillation ([place]) 09/23/2013   Prostate cancer ([place])    Status post lung surgery 12/07/2001   BENIGHN LESION ON RIGHT LUNG REMOVED   Thrombocytopenia ([place]) 09/25/2013   UNSPECIFIED PERIPHERAL VASCULAR DISEASE 02/18/2011   Qualifier: Diagnosis of  By: Terald Sleeper     Vitamin D deficiency      Significant Hospital Events: Including procedures, antibiotic start and stop dates in addition to other pertinent events   10/21/2021 pulmonary consult  Interim History / Subjective:  Frail 85 year old DNR with bilateral effusions that are new after volume resuscitation.  Objective   Blood pressure (!) 107/59, pulse 73, temperature 97.6 F (36.4 C), temperature source Axillary, resp. rate (!) 23, weight 74.1 kg, SpO2 94 %.    FiO2 (%):  [55 %] 55 %   Intake/Output Summary (Last 24 hours) at 10/21/2021 1130 Last data filed at 10/21/2021 0600 Gross per 24 hour  Intake 1393.6 ml  Output 700 ml  Net 693.6 ml   Filed Weights   10/19/21 0413 10/20/21 0619 10/21/21 0441  Weight: 75.1 kg 74 kg 74.1 kg    Examination: General: Frail confused male who is in hand restraints i.e. mittens and on BiPAP HENT: Positive JVD one half up, noninvasive mechanical ventilatory support mask is in place Lungs: Coarse rhonchi bilaterally Cardiovascular: Heart sounds  are distant Abdomen: Diffuse tenderness and swelling Extremities: 3+ edema in all extremities Neuro: Poorly responsive  right  right  left  Resolved Hospital Problem list     Assessment & Plan:  Failure to thrive in a patient with multiple comorbidities and a continued decline over the last few nights.  He is a nursing home patient noted for low albumin and  unstageable sacral wounds. Consider palliative care consult and to transition care from curative to comfort.  Bilateral pleural effusions in the setting of albumin 1.8 with IV fluid infusion for hypotension now receiving Lasix with a creatinine of 1.45. Assessment for thoracentesis Consider being more aggressive with diuresis Consider palliative care consult Stopped Eliquis If thoracentesis indicated we will perform 10/22/2021 after Eliquis has been discontinued  Positive C. Difficile Per primary  Renal insufficiency Lab Results  Component Value Date   CREATININE 1.45 (H) 10/21/2021   CREATININE 1.64 (H) 10/20/2021   CREATININE 1.74 (H) 10/19/2021   CREATININE 1.89 (H) 02/27/2016   CREATININE 1.60 (H) 10/24/2015   CREATININE 2.20 (H) 09/23/2015   Consider pushing diuresis as his creatinine is actually improved  Unstageable pressure injury to sacrum along with wounds to right and bilateral lower extremities Wound ostomy care is signed off  Best Practice (right click and "Reselect all SmartList Selections" daily)   Diet/type: NPO DVT prophylaxis: DOAC GI prophylaxis: PPI Lines: N/A Foley:  N/A Code Status:  DNR Last date of multidisciplinary goals of care discussion [tbd]  Labs   CBC: Recent Labs  Lab 10/23/2021 0845 10/18/21 0223 10/19/21 0240 10/20/21 0225 10/21/21 0922  WBC 19.1* 12.8* 10.9* 10.0 13.5*  NEUTROABS 15.7*  --  7.4 6.7 11.2*  HGB 10.5* 7.4* 8.1* 8.9* 9.7*  HCT 33.0* 23.4* 25.6* 27.5* 31.4*  MCV 93.8 94.0 94.5 92.0 94.6  PLT 246 196 205 242 633    Basic Metabolic Panel: Recent Labs  Lab 11/01/2021 1653 10/18/21 0223 10/18/21 0919 10/19/21 0240 10/20/21 0225 10/21/21 0922  NA  --  138 140 136 138 136  K  --  3.0* 3.4* 3.5 3.3* 4.1  CL  --  112* 114* 111 114* 111  CO2  --  14* 15* 18* 19* 20*  GLUCOSE  --  56* 77 119* 103* 110*  BUN  --  42* 41* 36* 33* 37*  CREATININE  --  2.01* 2.07* 1.74* 1.64* 1.45*  CALCIUM  --  7.2* 7.4* 7.4* 7.7*  7.9*  MG 1.1*  --  1.5* 2.1  --   --   PHOS  --   --   --  2.7  --   --    GFR: Estimated Creatinine Clearance: 40.5 mL/min (A) (by C-G formula based on SCr of 1.45 mg/dL (H)). Recent Labs  Lab 10/29/2021 0845 10/18/21 0223 10/19/21 0240 10/20/21 0225 10/21/21 0922  PROCALCITON  --   --   --   --  0.13  WBC 19.1* 12.8* 10.9* 10.0 13.5*  LATICACIDVEN 1.1  --   --   --   --     Liver Function Tests: Recent Labs  Lab 10/14/2021 0845 10/20/21 0225  AST 10* 14*  ALT 9 9  ALKPHOS 78 61  BILITOT 1.0 0.7  PROT 5.0* 5.0*  ALBUMIN 1.8* 2.2*   No results for input(s): LIPASE, AMYLASE in the last 168 hours. No results for input(s): AMMONIA in the last 168 hours.  ABG    Component Value Date/Time   PHART 7.264 (L) 10/21/2021 0406  PCO2ART 40.0 10/21/2021 0406   PO2ART 70.8 (L) 10/21/2021 0406   HCO3 17.6 (L) 10/21/2021 0406   TCO2 23 09/19/2013 1230   ACIDBASEDEF 8.2 (H) 10/21/2021 0406   O2SAT 92.8 10/21/2021 0406     Coagulation Profile: Recent Labs  Lab 10/14/2021 0845  INR 1.3*    Cardiac Enzymes: No results for input(s): CKTOTAL, CKMB, CKMBINDEX, TROPONINI in the last 168 hours.  HbA1C: Hgb A1c MFr Bld  Date/Time Value Ref Range Status  09/20/2013 05:00 AM 5.0 <5.7 % Final    Comment:    (NOTE)                                                                       According to the ADA Clinical Practice Recommendations for 2011, when HbA1c is used as a screening test:  >=6.5%   Diagnostic of Diabetes Mellitus           (if abnormal result is confirmed) 5.7-6.4%   Increased risk of developing Diabetes Mellitus References:Diagnosis and Classification of Diabetes Mellitus,Diabetes Care,2011,34(Suppl 1):S62-S69 and Standards of Medical Care in         Diabetes - 2011,Diabetes YQIH,4742,59 (Suppl 1):S11-S61.  09/19/2013 07:35 PM 5.2 <5.7 % Final    Comment:    (NOTE)                                                                       According to the ADA  Clinical Practice Recommendations for 2011, when HbA1c is used as a screening test:  >=6.5%   Diagnostic of Diabetes Mellitus           (if abnormal result is confirmed) 5.7-6.4%   Increased risk of developing Diabetes Mellitus References:Diagnosis and Classification of Diabetes Mellitus,Diabetes DGLO,7564,33(IRJJO 1):S62-S69 and Standards of Medical Care in         Diabetes - 2011,Diabetes ACZY,6063,01 (Suppl 1):S11-S61.    CBG: Recent Labs  Lab 10/19/21 1317 10/19/21 1721 10/20/21 0820 10/20/21 1208 10/20/21 1622  GLUCAP 113* 117* 101* 111* 126*    Review of Systems:   na  Past Medical History:  He,  has a past medical history of Alcohol abuse (01/21/2013), Ascites, CAD in native artery, Choledocholithiasis, Chronic combined systolic and diastolic congestive heart failure ([place]) (05/06/2015), Chronic kidney disease, stage 3b ([place]), Conduction disorder of the heart (09/25/2013), COPD (chronic obstructive pulmonary disease) ([place]), CVA (cerebral infarction) (09/19/2013), GERD (gastroesophageal reflux disease) (10/11/2013), GI bleed, Hemorrhagic shock ([place]) (03/16/2013), History of colonic diverticulitis, Hyperkalemia, Hyperlipidemia, Hypertension, Hypocalcemia, Ichthyosis, Lymphadenopathy, Mild carotid artery disease ([place]), Mitral regurgitation, Nonsustained ventricular tachycardia (05/09/2015), Olecranon bursitis, Onychomycosis, PAD (peripheral artery disease) ([place]), Permanent atrial fibrillation ([place]) (09/23/2013), Prostate cancer ([place]), Status post lung surgery (12/07/2001), Thrombocytopenia ([place]) (09/25/2013), UNSPECIFIED PERIPHERAL VASCULAR DISEASE (02/18/2011), and Vitamin D deficiency.   Surgical History:   Past Surgical History:  Procedure Laterality Date   BIOPSY  09/22/2021   Procedure: BIOPSY;  Surgeon: Jackquline Denmark, MD;  Location: [facility] ENDOSCOPY;  Service: Endoscopy;;   COLONOSCOPY  ESOPHAGEAL DILATION  09/22/2021   Procedure: ESOPHAGEAL DILATION;  Surgeon: Jackquline Denmark, MD;  Location[facility] ENDOSCOPY;  Service: Endoscopy;;   ESOPHAGOGASTRODUODENOSCOPY N/A 03/16/2013   Procedure: ESOPHAGOGASTRODUODENOSCOPY (EGD);  Surgeon: Cleotis Nipper, MD;  Location: [facility] ENDOSCOPY;  Service: Endoscopy;  Laterality: N/A;  will probably want to do at bedside in ICU---bed placement pending   ESOPHAGOGASTRODUODENOSCOPY (EGD) WITH PROPOFOL N/A 09/22/2021   Procedure: ESOPHAGOGASTRODUODENOSCOPY (EGD) WITH PROPOFOL;  Surgeon: Jackquline Denmark, MD;  Location[facility] ENDOSCOPY;  Service: Endoscopy;  Laterality: N/A;   FEMUR IM NAIL Left 01/21/2013   Procedure: INTRAMEDULLARY (IM) NAIL FEMORAL;  Surgeon: Hessie Dibble, MD;  Location: [facility] ORS;  Service: Orthopedics;  Laterality: Left;   HEMORRHOID SURGERY  12/07/1998   HIP PINNING,CANNULATED Right 05/07/2015   Procedure: CANNULATED HIP PINNING;  Surgeon: Phylliss Bob, MD;  Location: [facility] ORS;  Service: Orthopedics;  Laterality: Right;   LOOP RECORDER IMPLANT N/A 09/22/2013   Procedure: LOOP RECORDER IMPLANT;  Surgeon: Coralyn Mark, MD;  Location: [facility] CATH LAB;  Service: Cardiovascular;  Laterality: N/A;   LUNG SURGERY  12/07/2001   BENIGN LESION ON RIGHT LUNG REMOVED     Social History:   reports that he has been smoking cigarettes. He has a 97.50 pack-year smoking history. He has never used smokeless tobacco. He reports current alcohol use. He reports that he does not use drugs.   Family History:  His family history includes Hypertension in his brother, brother, brother, brother, brother, sister, and sister; Hypertension (age of onset: 35) in his father; Hypertension (age of onset: 41) in his mother.   Allergies No Active Allergies   Home Medications  Prior to Admission medications   Medication Sig Start Date End Date Taking? Authorizing Provider  albuterol (PROVENTIL HFA;VENTOLIN HFA) 108 (90 BASE) MCG/ACT inhaler Inhale 1 puff into the lungs every 6 (six) hours as needed for wheezing or shortness of breath.   Yes [provider]  alendronate (FOSAMAX) 70 MG tablet Take 1 tablet by mouth once a week. Mondays 08/23/18  Yes [provider]  amLODipine (NORVASC) 2.5 MG tablet Take 2.5 mg by mouth daily.   Yes [provider]  apixaban (ELIQUIS) 2.5 MG TABS tablet Take 1 tablet (2.5 mg total) by mouth 2 (two) times daily. 09/25/13  Yes Velvet Bathe, MD  atorvastatin (LIPITOR) 40 MG tablet Take 40 mg by mouth every evening.   Yes [provider]  bisacodyl (DULCOLAX) 5 MG EC tablet Take 1 tablet (5 mg total) by mouth daily as needed for moderate constipation. 09/23/21  Yes Antonieta Pert, MD  Cholecalciferol 25 MCG (1000 UT) tablet Take 1 tablet by mouth daily.   Yes [provider]  docusate sodium (COLACE) 100 MG capsule Take 200 mg by mouth daily as needed for mild constipation.   Yes [provider]  feeding supplement (ENSURE ENLIVE / ENSURE PLUS) LIQD Take 237 mLs by mouth 2 (two) times daily between meals. 09/23/21  Yes Antonieta Pert, MD  Fluticasone-Umeclidin-Vilant 100-62.5-25 MCG/INH AEPB Inhale 1 puff into the lungs daily.   Yes [provider]  folic acid (FOLVITE) 1 MG tablet Take 1 tablet (1 mg total) by mouth daily. 09/24/21  Yes Antonieta Pert, MD  guaiFENesin (MUCINEX) 600 MG 12 hr tablet Take 600 mg by mouth 2 (two) times daily.   Yes [provider]  Multiple Vitamins-Minerals (PRESERVISION AREDS PO) Take 1 tablet by mouth 2 (two) times daily.   Yes [provider]  nicotine (  NICODERM CQ - DOSED IN MG/24 HOURS) 14 mg/24hr patch Place 14 mg onto the skin daily.   Yes [provider]  nutrition supplement, JUVEN, (JUVEN) PACK Take 1 packet by mouth 2 (two) times daily between meals. 09/23/21  Yes Antonieta Pert, MD  nystatin cream (MYCOSTATIN) Apply 1 application topically 3 (three) times daily.   Yes [provider]  omega-3 acid ethyl esters (LOVAZA) 1 G capsule Take 1 g by mouth 2 (two) times daily.   Yes [provider]  pantoprazole (PROTONIX) 40 MG tablet Take 40 mg by mouth daily.   Yes [provider]  saccharomyces boulardii (FLORASTOR) 250 MG capsule Take 250 mg by mouth 2 (two) times daily.   Yes [provider]  sacubitril-valsartan (ENTRESTO) 49-51 MG Take 1 tablet by mouth 2 (two) times daily.   Yes [provider]  thiamine 100 MG tablet Take 100 mg by mouth daily.   Yes [provider]  Travoprost, BAK Free, (TRAVATAN) 0.004 % SOLN ophthalmic solution Place 1 drop into both eyes at bedtime.   Yes [provider]  triamcinolone ointment (KENALOG) 0.1 % Apply 1 application topically 3 (three) times daily.   Yes [provider]  ZINC OXIDE EX Apply 1 application topically in the morning, at noon, and at bedtime.   Yes [provider]     Critical care time:     Gaylyn Lambert ACNP Acute Care Nurse Practitioner [place] Please consult Amion 10/21/2021, 11:30 AM

## 2021-10-21 NOTE — Progress Notes (Signed)
PROGRESS NOTE  Matthew Mahoney VZC:[id number] DOB: 1935-12-26 DOA: 10/21/2021 PCP: [name], MD  Brief History   The patient is an 85 yr old man who presented to ED via EMS from [facility] due to concerns of sepsis after 2 weeks of loose stools. The patient had been previously admitted 09/17/2021-09/24/2021 for right lower extremity cellulitis. During that stay he was also found to have atrial fibrillation with a slow ventricular response and anemia of unknown cause. AV nodal agents were stopped. Cardiology saw no need for pacemaker. She also underwent an EGD that demonstrated a Schatzki ring, a small hiatal hernia, erosive gastritis with no signs of active bleeding. The esophagus was dilated. His eliquis was restarted. AFter returning to the facility he developed loose stools. On the day of presentation he was found to have a low blood pressure in the 70's. Lactic acid was 1.1. WBC was 19.1. K was 2.4, and magnesium was 1.1.  Stool was positive for C Diff colitis. The patient was given IV fluid resuscitation. He has been started on oral vancomycin. He has been admitted to a telemetry bed.  On the evening of 10/20/2021 the patient desaturated and required BIPAP to maintain saturations in the 90's.  Consultants  Wound Care PCCM IR  Procedures  None  Antibiotics   Anti-infectives (From admission, onward)    Start     Dose/Rate Route Frequency Ordered Stop   10/14/2021 1445  vancomycin (VANCOCIN) capsule 125 mg        125 mg Oral 4 times daily 10/09/2021 1442 10/27/21 1359       Subjective  The patient is resting comfortably. No new complaints.   Objective   Vitals:  Vitals:   10/21/21 1218 10/21/21 1228  BP:  (!) 110/96  Pulse:  79  Resp:  (!) 31  Temp:  97.6 F (36.4 C)  SpO2: 98% 98%    Exam:  Constitutional:  The patient is on BIPAP.  Respiratory:  No increased work of breathing. No wheezes, rales, or rhonchi No tactile  fremitus Cardiovascular:  Regular rate and rhythm No murmurs, ectopy, or gallups. No lateral PMI. No thrills. Abdomen:  Abdomen is soft, non-tender, non-distended No hernias, masses, or organomegaly Hyperactive bowel sounds.  Musculoskeletal:  No cyanosis, clubbing, or edema Skin:  No rashes, lesions, ulcers palpation of skin: no induration or nodules Unstagable pressure ulcer on sacrum Neurologic:  CN 2-12 intact Sensation all 4 extremities intact Psychiatric:  Mental status Mood, affect appropriate Orientation to person, place, time  judgment and insight appear intact  I have personally reviewed the following:   Today's Data  Vitals  Lab Data  BMP, CBC, procalcitonin  Micro Data  Stool positive for C Diff.  Imaging  CT abdomend and pelvis CXR  Cardiology Data  EKG  Scheduled Meds:  atorvastatin  40 mg Oral QPM   collagenase   Topical Daily   feeding supplement  237 mL Oral BID BM   fluticasone furoate-vilanterol  1 puff Inhalation Daily   furosemide  40 mg Intravenous BID   guaiFENesin  600 mg Oral BID   latanoprost  1 drop Both Eyes QHS   multivitamin  1 tablet Oral Daily   nutrition supplement (JUVEN)  1 packet Oral BID BM   pantoprazole  40 mg Oral Daily   saccharomyces boulardii  250 mg Oral BID   sodium chloride flush  3 mL Intravenous Q12H   thiamine  100 mg Oral Daily  umeclidinium bromide  1 puff Inhalation Daily   vancomycin  125 mg Oral QID   Principal Problem:   C. difficile colitis Active Problems:   Tobacco abuse   Pressure injury of skin   Protein-calorie malnutrition, severe   Hypotension   Leukocytosis   Hypokalemia   History of CHF (congestive heart failure)   Bilateral pleural effusion   LOS: 4 days   A & P  C. difficile colitis: Acute.  Patient presents with reports of diarrhea.  Noted to have several liquid stools while in the ED.  He is just recently been hospitalized for cellulitis of the right leg discharged on  Augmentin to continue till 10/20.  Stools noted to be positive for C. difficile antigen and toxigenic C. difficile by PCR.  CT scan of the abdomen and pelvis significant for concern for colitis. -Admit to a a progressive bed -Blood cultures have had no growth -Strict I&O's. Patient is positive 7.5 Liters in terms of fluid balance, although stool volume from rectal tube is not being measured. -Rectal tube has had no output overnight.  -Vancomycin capsules 4 times daily -Probiotics   Hypotension: Acute. Resolved. Due to GI losses. Resolved with IV fluids. Wean IV fluids. On admission blood pressures noted to be as low as 73/52 with temporary improvement with IV fluids.  Suspect secondary to diarrhea related to C. difficile colitis.  Patient had received 4 L of IV fluids in the ED with temporary improvement. Home blood pressure medications include Entresto 49-51 mg twice daily and amlodipine 2.5 mg daily. -Hold home blood pressure medication regimen Monitor  Acute hypoxic respiratory failure: Overnight the patient had increased oxygen requirements and required BIPAP to maintain SaO2. CXR demonstrates bilateral large pleural effusions. IR is unable to perform thoracentesis due to the patient's status. Pulmonology was consulted to perform thoracentesis. Ultrasound was performed and demonstrated blood in the fluid. Eliquis will be held and the patient will receive lasix. Plan is for thoracentesis tomorrow. There was concern for pneumonia. Procalcitonin is 0.13.    Leukocytosis: Acute. Resolved. WBC elevated 19.1 on presentation. Now to 13.9. Lactic acid was initially reassuring at 1.1.  Suspect secondary to above. -Monitor   Hypokalemia: Acute. Resolved. Initial potassium as low as 2.4.  3.3. this morning. Supplement. f  Hypomagnesemia: magnesium 1.1 on presentation. He has received 20 g of Mg. Repeat level is 1.5. He will be given 4 g more.   Acute kidney injury: On admission creatinine 2.17 with BUN  45 on admission. Creatinine is down to 1.49 this morning.  Previously creatinine had been 1.26 on 10/19.  The elevated BUN to creatinine ratio suggests prerenal cause of symptoms thought to be related to patient's reports of diarrhea. -Continue IV fluids as noted above -Monitor creatinine, electrolytes, and volume status. Avoid nephrotoxins and hypotension.  Atrial fibrillation on chronic anticoagulation: Patient currently in atrial fibrillation, but rate controlled.  He was noted to have heart rates temporarily in the 40s telemetry. -Continue Eliquis -Rate is controlled.   History of CHF: Chronic.  Patient's last EF was noted to have improved to 50-55% per last echo in 04/2015 from 20-25% in 2014.  He does not appear to be grossly fluid overloaded at this time. -Strict intake and output -Daily weights   Normocytic anemia: Hemoglobin 10.5 g/dL on admission, baseline hemoglobin have been previously around 9.  Repeat hemoglobin is 7.4. Hemoglobin is 8.9. Monitor and transfuse for hemoglobin less than 7.0. Suspect patient was significantly dehydrated. -Continue to monitor  Severe protein calorie malnutrition: On admission patient's albumin was noted to be low at 1.8. Prealbumin found to be less than 5. Consult nutrition.   Pressure ulcer and right lower extremity wound: Patient noted to have just mild erythema without significant drainage or increased warmth. -Wound care consult   Tobacco abuse -Continue nicotine patch   GERD -Continue Protonix  I have seen and examined this patient myself. I have spent 36 minutes in his evaluation and care.   DNR: Modified DNR: Patient and family are amenable to pressure support agents if necessary.  DVT prophylaxis: Eliquis Code Status: Modified DNR: Patient and family are amenable to pressure support agents if necessary. Family Communication:  None available Disposition Plan: Likely need to be discharged back to nursing facility  [place],  DO [facility] Direct contact: see [email or web address]  7PM-7AM contact night coverage as above 10/21/2021, 15:02 PM  LOS: 1 day

## 2021-10-21 NOTE — Progress Notes (Signed)
RT note- Patient more awake but very weak, attempted to remove Bipap and place on 6L, sp02 91%. BBS very coarse and rhonchi. Sputum and coughing moderate amount thick tan secretions. Spoke with RN concerning patient wearing mittens and not able to manage secretions and worry for aspiration. Mode of Bipap changed to AVAPS with hope for target VT. Continue to monitor.

## 2021-10-21 NOTE — Consult Note (Signed)
Consultation Note Date: 10/21/2021   Patient Name: Matthew Mahoney  DOB: 03-31-1936  MRN: [id number]  Age / Sex: 85 y.o., male  PCP: [name], MD Referring Physician: Karie Kirks, DO  Reason for Consultation: Establishing goals of care "failure to thrive"  HPI/Patient Profile: 85 y.o. male  with multiple medical issues including chronic combined heart failure, CKD stage 3b, COPD, CAD, and MVR who presented to the emergency department from SNF on 10/26/2021 with diarrhea.  Per nursing home staff, patient had been having loose stools for the last 2 weeks and they called EMS due to concern for possible sepsis. He was found to be In the ED, CT abdomen showed inflammatory stranding of the rectosigmoid colon concerning for colitis. He was found positive for C.difficile. He was initially hypotensive and treated with IV fluids. He subsequently developed large bilateral pleural effusions.  On the evening of 11/14 he developed acute respiratory failure requiring BiPAP.   Clinical Assessment and Goals of Care: I have reviewed medical records including EPIC notes, labs and imaging, and went to see patient at bedside. On my assessment, his breathing appears labored. He is on 6L nasal cannula. His eyes are open and he can follow simple commands. He is oriented to self, but his speech is mumbled and very difficult to understand.   I spoke with his sister Matthew Mahoney to discuss diagnosis, prognosis, GOC, EOL wishes, disposition, and options. I introduced Palliative Medicine as specialized medical care for people living with serious illness. It focuses on providing relief from the symptoms and stress of a serious illness.   We discussed a brief life review of the patient. He served in [employer] for several years. 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 He was married twice - he is divorced from  his first wife and his second wife died in 02-Mar-2005 from lung cancer. He does not have children. His next of kin are his siblings - 3 brothers and 2 sisters. Matthew Mahoney has been the closest to him and most involved in this care.   Patient was living independently in his home in [place] until he was hospitalized 09/17/21. He was discharged to SNF on 09/24/21. Discussed that his functional and nutritional status has significantly declined since then. This is evidenced by his low albumin of 1.8 and presence of an unstageable pressure ulcer to his sacrum.  We discussed his current illness and what it means in the larger context of his ongoing co-morbidities.  Natural disease trajectory of chronic illness and debility was discussed. Explained that when he was hospitalized in October for cellulitis, he was treated with antibiotics which has subsequently resulted in c-diff infection. Discussed that the infection has affected multiple organ systems and he is in acute respiratory failure.   The difference between full scope medical intervention and comfort care was considered.  I introduced the concept of a comfort path, emphasizing that this involves de-escalating and stopping full scope medical interventions, allowing a natural course to occur.  Discussed that the goal is comfort and dignity rather than cure/prolonging life. Encouraged Matthew Mahoney to consider at what point they would want to stop full scope medical interventions, keeping in mind the concept of quality of life.   Matthew Mahoney states that her brother's "life has deteriorated in the last few years". Created space and opportunity for patient and family to express thoughts and feelings regarding patient's current medical situation. Emotional support provided. Matthew Mahoney states that she and her siblings have already discussed their brother's decline and have been considering transition to comfort. She requests the opportunity to speak with them this evening prior to making any final  decisions.  I gently reminded Matthew Mahoney that in the meantime patient is very fragile and at high risk for decompensation. Matthew Mahoney verbalizes understanding and is willing to touch base with me in the morning regarding next steps.    Primary decision maker: Patient's siblings. There is no documented HCPOA. Sister Matthew Mahoney is the family spokesperson.     SUMMARY OF RECOMMENDATIONS   Continue current care Sister is considering transition to comfort care, but needs to discuss with her other siblings first Recommend proceeding with thoracentesis to relieve symptom burden PMT will follow-up with family tomorrow   Code Status/Advance Care Planning: DNR  Prognosis:  Very poor  Discharge Planning: To Be Determined      Primary Diagnoses: Present on Admission:  C. difficile colitis  Hypotension  Leukocytosis  Tobacco abuse  Pressure injury of skin  Protein-calorie malnutrition, severe  Hypokalemia   I have reviewed the medical record, interviewed the patient and family, and examined the patient. The following aspects are pertinent.  Past Medical History:  Diagnosis Date   Alcohol abuse 01/21/2013   Ascites    HISTORY   CAD in native artery    Choledocholithiasis    Chronic combined systolic and diastolic congestive heart failure ([place]) 05/06/2015   Chronic kidney disease, stage 3b (HCC)    Conduction disorder of the heart 09/25/2013   COPD (chronic obstructive pulmonary disease) (HCC)    CVA (cerebral infarction) 09/19/2013   GERD (gastroesophageal reflux disease) 10/11/2013   GI bleed    a. 03/2013 a/w hemorrhagic shock - EGD on 03/1013, which revealed active bleeding from proximal duodenum, with complete hemostasis following epinephrine injection therapy. At least 2 duodenal ulcers identified.   Hemorrhagic shock ([place]) 03/16/2013   History of colonic diverticulitis    Hyperkalemia    Hyperlipidemia    Hypertension    Hypocalcemia    Ichthyosis    Lymphadenopathy    Mild carotid  artery disease (HCC)    Mitral regurgitation    Nonsustained ventricular tachycardia 05/09/2015   Olecranon bursitis    Onychomycosis    PAD (peripheral artery disease) (HCC)    a. suspected by noninvasive testing -> conservative rx.   Permanent atrial fibrillation ([place]) 09/23/2013   Prostate cancer ([place])    Status post lung surgery 12/07/2001   BENIGHN LESION ON RIGHT LUNG REMOVED   Thrombocytopenia ([place]) 09/25/2013   UNSPECIFIED PERIPHERAL VASCULAR DISEASE 02/18/2011   Qualifier: Diagnosis of  By: Terald Sleeper     Vitamin D deficiency    Scheduled Meds:  apixaban  2.5 mg Oral BID   atorvastatin  40 mg Oral QPM   collagenase   Topical Daily   feeding supplement  237 mL Oral BID BM   fluticasone furoate-vilanterol  1 puff Inhalation Daily   guaiFENesin  600 mg Oral BID   latanoprost  1 drop Both Eyes QHS  multivitamin  1 tablet Oral Daily   nutrition supplement (JUVEN)  1 packet Oral BID BM   pantoprazole  40 mg Oral Daily   saccharomyces boulardii  250 mg Oral BID   sodium chloride flush  3 mL Intravenous Q12H   thiamine  100 mg Oral Daily   umeclidinium bromide  1 puff Inhalation Daily   vancomycin  125 mg Oral QID   Continuous Infusions: PRN Meds:.LORazepam   No Active Allergies Review of Systems  Unable to perform ROS: Other   Physical Exam Vitals reviewed.  Constitutional:      Appearance: He is ill-appearing.  Cardiovascular:     Rate and Rhythm: Normal rate.  Pulmonary:     Effort: Tachypnea present.  Neurological:     Mental Status: He is alert.     Motor: Weakness present.     Comments: Oriented to self    Vital Signs: BP (!) 110/96 (BP Location: Left Arm)   Pulse 79   Temp 97.6 F (36.4 C) (Oral)   Resp (!) 31   Wt 74.1 kg   SpO2 98%   BMI 22.16 kg/m  Pain Scale: Faces   Pain Score: Asleep   SpO2: SpO2: 98 % O2 Device:SpO2: 98 % O2 Flow Rate: .O2 Flow Rate (L/min): 6 L/min     Palliative Assessment/Data: PPS 20%     Time  In: 1315 Time Out: 1427 Time Total: 72 minutes Greater than 50%  of this time was spent counseling and coordinating care related to the above assessment and plan.  Signed by: Lavena Bullion, NP   Please contact Palliative Medicine Team phone at [phone] for questions and concerns.  For individual provider: See Shea Evans

## 2021-10-21 NOTE — Progress Notes (Addendum)
Initial Nutrition Assessment  DOCUMENTATION CODES:   Severe malnutrition in context of chronic illness  INTERVENTION:  - Encourage PO intake  - Continue Ensure Enlive po BID, each supplement provides 350 kcal and 20 grams of protein  - Continue 1 packet Juven BID, each packet provides 95 calories, 2.5 grams of protein (collagen), and 9.8 grams of carbohydrate (3 grams sugar); also contains 7 grams of L-arginine and L-glutamine, 300 mg vitamin C, 15 mg vitamin E, 1.2 mcg vitamin B-12, 9.5 mg zinc, 200 mg calcium, and 1.5 g  Calcium Beta-hydroxy-Beta-methylbutyrate to support wound healing  - Continue MVI with minerals daily  - Pending palliative consult, recommend Cortrak placement if within [place] DIAGNOSIS:   Severe Malnutrition related to chronic illness (CHF and COPD) as evidenced by severe fat depletion, severe muscle depletion.  GOAL:   Patient will meet greater than or equal to 90% of their needs  MONITOR:   PO intake, Supplement acceptance, Labs, Weight trends  REASON FOR ASSESSMENT:   Consult Assessment of nutrition requirement/status  ASSESSMENT:   Pt admitted from nursing facility with diarrhea x2 weeks secondary to c. diff. Previous admission 10/12-10/19 pt found to have Schatzki ring, hiatal hernia, and erosive gastritis. PMH includes afib, CHF, COPD, HTN, hyperlipidemia, PVD, and prostate cancer.  Noted tentative plans for thoracentesis tomorrow d/t pleural effusion and compressive atelectasis.   Pt was drowsy during visit. Unable to get detailed history of PO intake and weight trends since last admission. Pt did nod to having a good appetite and taking the Ensure and Juven. Limited documentation of meal completions however, the 2 meals documented reflect 0-5% of meal completion. Spoke with RN, states pt has not been eating or drinking d/t drowsiness and disorientation. If within [place] discussion with palliative, recommend Cortrak placement.  Limited  weight history documented. Noted during last admission 1 month ago, pt reported weighing 160 lbs. His current weight is documented as 163 lbs. Will continue to follow up with pt and monitor weight trends.  Medications: collagenase, lasix, MVI, protonix, probiotic, thiamine, vancomycin  Labs: BUN 37, Cr 1.45, AST 14 UOP: 740mL x12 hours I/O's: +8257mL since admission   NUTRITION - FOCUSED PHYSICAL EXAM:  Flowsheet Row Most Recent Value  Orbital Region Severe depletion  Upper Arm Region Severe depletion  Thoracic and Lumbar Region Severe depletion  Buccal Region Moderate depletion  Temple Region Moderate depletion  Clavicle Bone Region Severe depletion  Clavicle and Acromion Bone Region Severe depletion  Scapular Bone Region Moderate depletion  Dorsal Hand Moderate depletion  Patellar Region Severe depletion  Anterior Thigh Region Severe depletion  Posterior Calf Region Unable to assess  [pt with compression boots]  Edema (RD Assessment) Mild  Hair Reviewed  Eyes Reviewed  Mouth Other (Comment)  [brownish coating on tongue,  wears denture]  Skin Reviewed  Nails Reviewed       Diet Order:   Diet Order             Diet regular Room service appropriate? Yes with Assist; Fluid consistency: Thin  Diet effective now                   EDUCATION NEEDS:   Not appropriate for education at this time  Skin:  Skin Assessment: Skin Integrity Issues: Skin Integrity Issues:: Stage II, Other (Comment) Stage II: sacrum Other: red L heel; purple-ish red R heel  Last BM:  10/20/21 type 7 via rectal tube  Height:   Ht Readings from Last  1 Encounters:  09/17/21 6' (1.829 m)    Weight:   Wt Readings from Last 1 Encounters:  10/21/21 74.1 kg   BMI:  Body mass index is 22.16 kg/m.  Estimated Nutritional Needs:   Kcal:  1900-2100  Protein:  95-105g  Fluid:  >1.9L  Clayborne Dana, RDN, LDN Clinical Nutrition

## 2021-10-21 NOTE — Progress Notes (Addendum)
  X-cover Note: Received call from bedside RN regarding worsening hypoxic respiratory failure. Apparently pt received dose of ativan in the late afternoon on 10-20-2021 due to agitation.  Per RN, pt has been sleepy since the start of her shift at 7 pm on 10-20-2021.  RA pulse ox 75%. Placed on 4L. RT has had to steadily increase O2 up to 14 L/min.  Abg ordered and resulted. ABG    Component Value Date/Time   PHART 7.264 (L) 10/21/2021 0406   PCO2ART 40.0 10/21/2021 0406   PO2ART 70.8 (L) 10/21/2021 0406   HCO3 17.6 (L) 10/21/2021 0406   TCO2 23 09/19/2013 1230   ACIDBASEDEF 8.2 (H) 10/21/2021 0406   O2SAT 92.8 10/21/2021 0406   CXR shows large pleural effusion and compressive atelectasis. Pt has had moderate bilateral pleural effusion on CT 10/18/2021.    Will stop IVF. Give small dose of IV lasix 20 mg due to recent AKI.  Pt will be placed on bipap temporarily until he can get thoracentesis(right side and possibly left side) this AM by IR  Kristopher Oppenheim, DO [facility]

## 2021-10-21 NOTE — Progress Notes (Addendum)
SPO2 is dropping to 75 on room air. Pt is showing signs of sleep apnea. He is snoring and very drowsy breathing through his mouth, then waking up with choking and coughing.  We at the beginning put him on NCL titrated from 4-7LPM. RT, Christy Sartorius was notified. RT was at bedside. Venturi face mask with 14 LPM at Fio2 55% was given. Pt's SPO2 was up to 92-93%. He opened his eyes and responded to commands, but disoriented x 4, continue to be very drowsy. He is hemodynamically stable. His lungs has diminished breath sound lower bilaterally. Dr. Bridgett Larsson ,the on-call provider made aware. Order received for ABG stat and chest x-ray. We will continue to monitor.  Kennyth Lose, RN

## 2021-10-21 NOTE — Progress Notes (Signed)
BIPAP order by DR. Chen. RT explained patients mental status and placing him on BIPAP to MD. RT placed patient on BIPAP per MD order.

## 2021-10-21 NOTE — Progress Notes (Signed)
Request to IR for thoracentesis - per RN patient unable to travel due to BiPAP.   Unfortunately IR APPs do not have privileges to perform procedures on the floor at this time, as such RN was instructed to consult CCM for thoracentesis. If patient becomes able to travel to IR we are happy to proceed with thoracentesis.  Candiss Norse, PA-C

## 2021-10-21 NOTE — Progress Notes (Signed)
IR contacted me regarding thoracentesis, since the patient is on BIPAP, he can not go down due to instability, Critical care needs to be consulted, MD notified of this info.   Chrisandra Carota, RN 10/21/2021 10:28 AM

## 2021-10-21 NOTE — Procedures (Signed)
Chest ultrasound was done at bedside, showing bilateral large pleural effusion.  With sedimentation/hematocrit sign was positive.  Patient is on Eliquis, will start Lasix today, will try to perform thoracentesis tomorrow/Thursday.

## 2021-10-21 NOTE — Care Management Important Message (Signed)
Important Message  Patient Details  Name: Matthew Mahoney MRN: [id number] Date of Birth: 1936-08-16   Medicare Important Message Given:  Yes     Shelda Altes 10/21/2021, 11:46 AM

## 2021-10-21 NOTE — Progress Notes (Signed)
RT note- Patient taken off Bipap and now is on 6Lmin [place], tolerating well.

## 2021-10-22 ENCOUNTER — Ambulatory Visit: Payer: Medicare Other | Admitting: Cardiovascular Disease

## 2021-10-22 ENCOUNTER — Inpatient Hospital Stay (HOSPITAL_COMMUNITY): Payer: Medicare Other

## 2021-10-22 DIAGNOSIS — J9601 Acute respiratory failure with hypoxia: Secondary | ICD-10-CM | POA: Diagnosis not present

## 2021-10-22 DIAGNOSIS — A0472 Enterocolitis due to Clostridium difficile, not specified as recurrent: Secondary | ICD-10-CM | POA: Diagnosis not present

## 2021-10-22 DIAGNOSIS — R0609 Other forms of dyspnea: Secondary | ICD-10-CM | POA: Diagnosis not present

## 2021-10-22 DIAGNOSIS — E876 Hypokalemia: Secondary | ICD-10-CM | POA: Diagnosis not present

## 2021-10-22 DIAGNOSIS — Z8679 Personal history of other diseases of the circulatory system: Secondary | ICD-10-CM | POA: Diagnosis not present

## 2021-10-22 DIAGNOSIS — J90 Pleural effusion, not elsewhere classified: Secondary | ICD-10-CM | POA: Diagnosis not present

## 2021-10-22 DIAGNOSIS — Z515 Encounter for palliative care: Secondary | ICD-10-CM | POA: Diagnosis not present

## 2021-10-22 LAB — ECHOCARDIOGRAM COMPLETE
AR max vel: 2.42 cm2
AV Area VTI: 1.87 cm2
AV Area mean vel: 2.25 cm2
AV Mean grad: 2 mmHg
AV Peak grad: 5 mmHg
Ao pk vel: 1.12 m/s
Area-P 1/2: 4.41 cm2
S' Lateral: 2.8 cm
Weight: 2666.68 oz

## 2021-10-22 LAB — CULTURE, BLOOD (ROUTINE X 2)
Culture: NO GROWTH
Culture: NO GROWTH
Special Requests: ADEQUATE

## 2021-10-22 LAB — PROCALCITONIN: Procalcitonin: 0.64 ng/mL

## 2021-10-22 MED ORDER — IPRATROPIUM-ALBUTEROL 0.5-2.5 (3) MG/3ML IN SOLN
3.0000 mL | Freq: Four times a day (QID) | RESPIRATORY_TRACT | Status: DC
  Administered 2021-10-22 – 2021-10-23 (×5): 3 mL via RESPIRATORY_TRACT
  Filled 2021-10-22 (×4): qty 3

## 2021-10-22 MED ORDER — METRONIDAZOLE 500 MG/100ML IV SOLN: 500.0000 mg | Freq: Two times a day (BID) | INTRAVENOUS | Status: DC

## 2021-10-22 MED ORDER — METRONIDAZOLE 500 MG PO TABS
500.0000 mg | ORAL_TABLET | Freq: Two times a day (BID) | ORAL | Status: DC
  Administered 2021-10-22 – 2021-10-23 (×3): 500 mg via ORAL
  Filled 2021-10-22 (×3): qty 1

## 2021-10-22 MED ORDER — METRONIDAZOLE 500 MG/100ML IV SOLN
500.0000 mg | Freq: Two times a day (BID) | INTRAVENOUS | Status: DC
Start: 2021-10-22 — End: 2021-10-22

## 2021-10-22 MED ORDER — ALBUMIN HUMAN 25 % IV SOLN
25.0000 g | Freq: Four times a day (QID) | INTRAVENOUS | Status: AC
  Administered 2021-10-22 – 2021-10-23 (×4): 25 g via INTRAVENOUS
  Filled 2021-10-22 (×4): qty 100

## 2021-10-22 NOTE — Progress Notes (Signed)
Patient experienced 17 runs of Vtach. Current pulse 58-60, BP 98/72, Resp 24, Oxygen 98% on 7L [place]. Notified MD, waiting reponse.  Daymon Larsen, RN

## 2021-10-22 NOTE — Progress Notes (Signed)
PROGRESS NOTE    Matthew Mahoney  RWE:[id number] DOB: 1936-03-31 DOA: 10/08/2021 PCP: [name], MD    Brief Narrative:  [name] was admitted to the hospital with the working diagnosis of acute C diff colitis, complicated with hypovolemic shock.   85 year old male past medical history for atrial fibrillation, heart failure, COPD, hypertension, dyslipidemia, peripheral vascular disease and prostate cancer who presented with diarrhea.  He had a recent hospitalization 10/12-10/19/2022 for right lower extremity cellulitis.  His hospitalization was complicated by atrial fibrillation with rapid ventricular response and worsening anemia.  She was diagnosed with erosive gastritis.  He reported 2 weeks of diarrhea, associated with abdominal pain.  Because of his rapid worsening condition EMS was called and patient was brought to the hospital.  On his initial physical examination his blood pressure was 97/47, heart rate 83, respirate 24, temperature 97.1, oxygen saturation 99%.  Patient was lethargic, oriented only times person and place, his lungs had rales bilaterally, heart S1-S2, irregularly irregular, abdomen tender to palpation, no lower extremity edema.  Positive sacral ulcer with surrounding erythema.  Sodium 140, potassium 2.4, chloride 107, bicarb 20, glucose 87, BUN 47, creatinine 2.17, AST 10, ALT 9, white cell count 19.1, hemoglobin 10.5, hematocrit 33.0, platelets 246. SARS COVID-19 negative.  C. difficile was positive antigen, positive.   Chest radiograph with bibasilar atelectasis.  EKG 87 bpm, left axis deviation, QTC 501, right bundle branch block, atrial fibrillation rhythm, positive PVCs, no significant ST segment or T wave changes.  Low voltage.  Patient received aggressive fluid resuscitation, antibiotic therapy with oral vancomycin.  Patient developed acute hypoxic respiratory failure, follow-up chest radiograph showed worsening pleural effusions more right than  left. He required non invasive mechanical ventilation.  Improved oxygenation with diuresis.   Assessment & Plan:   Principal Problem:   C. difficile colitis Active Problems:   Tobacco abuse   Pressure injury of skin   Protein-calorie malnutrition, severe   Hypotension   Leukocytosis   Hypokalemia   History of CHF (congestive heart failure)   Bilateral pleural effusion   C diff diarrhea and colitis, complicated with hypovolemic shock.  Blood pressure continue to be low systolic 83 to 619 mmHg, patient is lethargic and somnolent.  HR 45 to 70 bpm.  Wbc is 13,5  Continue medical therapy with oral vancomycin.  Discontinue IV metronidazole  Continue close hemodynamic monitoring, holding now on IV fluids to prevent third spacing and pulmonary edema.  Patient received albumin yesterday.   2. Acute hypoxemic respiratory failure due to volume overload pulmonary edema, acute on chronic systolic heart failure decompensation, left pleural effusion.  His work of breathing and oxygenation have improved, today's oxymetry is 98 to 100% on 8 L/min per .   Continue close oxymetry monitoring. Documented urine output 1000 ml over last 24 hrs Will hold on IV diuresis today to prevent hypotension.   Check echocardiogram, last echo from 2016 patient had a preserved LV systolic function.   3. Aki on CKD stage 3a. hypokalemia. Renal function with serum cr at 1,45 with K at 4,1 and serum bicarbonate at 20. Continue close follow up renal function and electrolytes.  Hold on furosemide for today.   4. Severe protein calorie malnutrition. Stage 2 pressure ulcer at the sacrum. Continue with nutritional supplementation. Patient is very weak and deconditioned.   5. GERD. Continue with pantoprazole  6, dyslipidemia. Continue with statin therapy.   Patient continue to be at high risk for worsening hypotension  Status is: Inpatient  Remains inpatient appropriate because: blood pressure  monitoring        DVT prophylaxis:  Enoxaparin   Code Status:   DNR   Family Communication:   No family at the bedside      Nutrition Status: Nutrition Problem: Severe Malnutrition Etiology: chronic illness (CHF and COPD) Signs/Symptoms: severe fat depletion, severe muscle depletion Interventions: Ensure Enlive (each supplement provides 350kcal and 20 grams of protein), Juven, MVI     Skin Documentation: Pressure Injury 09/17/21 Buttocks Medial;Upper;Lower Stage 2 -  Partial thickness loss of dermis presenting as a shallow open injury with a red, pink wound bed without slough. redness (Active)  09/17/21 1859  Location: [facility]  Location Orientation: Medial;Upper;Lower  Staging: Stage 2 -  Partial thickness loss of dermis presenting as a shallow open injury with a red, pink wound bed without slough.  Wound Description (Comments): redness  Present on Admission: Yes     Pressure Injury 10/09/2021 Sacrum Medial Stage 2 -  Partial thickness loss of dermis presenting as a shallow open injury with a red, pink wound bed without slough. (Active)  10/13/2021 1600  Location: Sacrum  Location Orientation: Medial  Staging: Stage 2 -  Partial thickness loss of dermis presenting as a shallow open injury with a red, pink wound bed without slough.  Wound Description (Comments):   Present on Admission: Yes     Pressure Injury 10/20/2021 Heel Left purplish red (Active)  11/05/2021 2130  Location: Heel  Location Orientation: Left  Staging:   Wound Description (Comments): purplish red  Present on Admission: Yes     Pressure Injury 10/18/2021 Heel Right red (Active)  10/12/2021 2130  Location: Heel  Location Orientation: Right  Staging:   Wound Description (Comments): red  Present on Admission:      Consultants:  Pulmonary   Subjective: Patient is very weak and deconditioned, no nausea or vomiting, no dyspnea or chest pain, has been taking po medications.   Objective: Vitals:    10/22/21 0740 10/22/21 0812 10/22/21 0832 10/22/21 0959  BP:  98/72  97/81  Pulse: 62 (!) 52    Resp: 17 (!) 24  18  Temp:      TempSrc:      SpO2: 100% 98% 100% 100%  Weight:        Intake/Output Summary (Last 24 hours) at 10/22/2021 1020 Last data filed at 10/22/2021 0500 Gross per 24 hour  Intake 75 ml  Output 1001 ml  Net -926 ml   Filed Weights   10/20/21 0619 10/21/21 0441 10/22/21 0413  Weight: 74 kg 74.1 kg 75.6 kg    Examination:   General: deconditioned and ill looking appearing  Neurology: Awake and alert, non focal  E ENT: no pallor, no icterus, oral mucosa moist Cardiovascular: No JVD. S1-S2 present, rhythmic, no gallops, rubs, or murmurs. No lower extremity edema. Pulmonary: positive breath sounds bilaterally,with no wheezing, or rhonchi, scattered rales on anterior auscultation . Gastrointestinal. Abdomen soft and non tender Skin. No rashes Musculoskeletal: no joint deformities     Data Reviewed: I have personally reviewed following labs and imaging studies  CBC: Recent Labs  Lab 10/16/2021 0845 10/18/21 0223 10/19/21 0240 10/20/21 0225 10/21/21 0922  WBC 19.1* 12.8* 10.9* 10.0 13.5*  NEUTROABS 15.7*  --  7.4 6.7 11.2*  HGB 10.5* 7.4* 8.1* 8.9* 9.7*  HCT 33.0* 23.4* 25.6* 27.5* 31.4*  MCV 93.8 94.0 94.5 92.0 94.6  PLT 246 196 205 242 278   Basic  Metabolic Panel: Recent Labs  Lab 10/26/2021 1653 10/18/21 0223 10/18/21 0919 10/19/21 0240 10/20/21 0225 10/21/21 0922  NA  --  138 140 136 138 136  K  --  3.0* 3.4* 3.5 3.3* 4.1  CL  --  112* 114* 111 114* 111  CO2  --  14* 15* 18* 19* 20*  GLUCOSE  --  56* 77 119* 103* 110*  BUN  --  42* 41* 36* 33* 37*  CREATININE  --  2.01* 2.07* 1.74* 1.64* 1.45*  CALCIUM  --  7.2* 7.4* 7.4* 7.7* 7.9*  MG 1.1*  --  1.5* 2.1  --   --   PHOS  --   --   --  2.7  --   --    GFR: Estimated Creatinine Clearance: 41.3 mL/min (A) (by C-G formula based on SCr of 1.45 mg/dL (H)). Liver Function Tests: Recent  Labs  Lab 11/01/2021 0845 10/20/21 0225  AST 10* 14*  ALT 9 9  ALKPHOS 78 61  BILITOT 1.0 0.7  PROT 5.0* 5.0*  ALBUMIN 1.8* 2.2*   No results for input(s): LIPASE, AMYLASE in the last 168 hours. No results for input(s): AMMONIA in the last 168 hours. Coagulation Profile: Recent Labs  Lab 10/31/2021 0845  INR 1.3*   Cardiac Enzymes: No results for input(s): CKTOTAL, CKMB, CKMBINDEX, TROPONINI in the last 168 hours. BNP (last 3 results) No results for input(s): PROBNP in the last 8760 hours. HbA1C: No results for input(s): HGBA1C in the last 72 hours. CBG: Recent Labs  Lab 10/19/21 1317 10/19/21 1721 10/20/21 0820 10/20/21 1208 10/20/21 1622  GLUCAP 113* 117* 101* 111* 126*   Lipid Profile: No results for input(s): CHOL, HDL, LDLCALC, TRIG, CHOLHDL, LDLDIRECT in the last 72 hours. Thyroid Function Tests: No results for input(s): TSH, T4TOTAL, FREET4, T3FREE, THYROIDAB in the last 72 hours. Anemia Panel: No results for input(s): VITAMINB12, FOLATE, FERRITIN, TIBC, IRON, RETICCTPCT in the last 72 hours.    Radiology Studies: I have reviewed all of the imaging during this hospital visit personally     Scheduled Meds:  atorvastatin  40 mg Oral QPM   collagenase   Topical Daily   feeding supplement  237 mL Oral BID BM   fluticasone furoate-vilanterol  1 puff Inhalation Daily   furosemide  40 mg Intravenous BID   guaiFENesin  600 mg Oral BID   latanoprost  1 drop Both Eyes QHS   multivitamin  1 tablet Oral Daily   nutrition supplement (JUVEN)  1 packet Oral BID BM   pantoprazole  40 mg Oral Daily   saccharomyces boulardii  250 mg Oral BID   sodium chloride flush  3 mL Intravenous Q12H   thiamine  100 mg Oral Daily   umeclidinium bromide  1 puff Inhalation Daily   vancomycin  125 mg Oral QID   Continuous Infusions:   LOS: 5 days        Athleen Feltner Gerome Apley, MD

## 2021-10-22 NOTE — Plan of Care (Signed)
  Problem: Clinical Measurements: Goal: Ability to maintain clinical measurements within normal limits will improve Outcome: Progressing Goal: Will remain free from infection Outcome: Progressing Goal: Respiratory complications will improve Outcome: Progressing Goal: Cardiovascular complication will be avoided Outcome: Progressing   

## 2021-10-22 NOTE — Progress Notes (Signed)
Patients sisters are here to visit. Palliative asked to be notified when family arrives. Notified palliative via secure chat, amion and called. No response.  Daymon Larsen, RN

## 2021-10-22 NOTE — Progress Notes (Addendum)
NAME:  Matthew Mahoney, MRN:  [id number], DOB:  11/05/1936, LOS: 5 ADMISSION DATE:  10/29/2021, CONSULTATION DATE: 08/21/2021 REFERRING MD: [facility], CHIEF COMPLAINT: Bilateral pleural effusions  History of Present Illness:  85 year old nursing home patient with unstageable sacral wounds plethora of health issues that are well documented below who presented with a new diagnosis of C. difficile colitis he has extensive cardiac history including coronary artery disease mitral regurgitation nonsustained ventricular tachycardia.  On admission he was hypotensive and treated with fluids.  He developed large bilateral pleural effusions almost overnight on 11/14 along with increasing O2 needs and placed on bipap.  Interventional radiology did not want to do thoracentesis due to his being unstable.  Pulmonary critical care called to the bedside for for possible thoracentesis.  We have stopped his Eliquis at this time.  We will attempt diuresis as tolerated.  And plan on doing thoracentesis in coming days if still indicated.  Pertinent  Medical History   Past Medical History:  Diagnosis Date   Alcohol abuse 01/21/2013   Ascites    HISTORY   CAD in native artery    Choledocholithiasis    Chronic combined systolic and diastolic congestive heart failure ([place]) 05/06/2015   Chronic kidney disease, stage 3b (HCC)    Conduction disorder of the heart 09/25/2013   COPD (chronic obstructive pulmonary disease) (HCC)    CVA (cerebral infarction) 09/19/2013   GERD (gastroesophageal reflux disease) 10/11/2013   GI bleed    a. 03/2013 a/w hemorrhagic shock - EGD on 03/1013, which revealed active bleeding from proximal duodenum, with complete hemostasis following epinephrine injection therapy. At least 2 duodenal ulcers identified.   Hemorrhagic shock ([place]) 03/16/2013   History of colonic diverticulitis    Hyperkalemia    Hyperlipidemia    Hypertension    Hypocalcemia    Ichthyosis    Lymphadenopathy    Mild  carotid artery disease (HCC)    Mitral regurgitation    Nonsustained ventricular tachycardia 05/09/2015   Olecranon bursitis    Onychomycosis    PAD (peripheral artery disease) (HCC)    a. suspected by noninvasive testing -> conservative rx.   Permanent atrial fibrillation ([place]) 09/23/2013   Prostate cancer ([place])    Status post lung surgery 12/07/2001   BENIGHN LESION ON RIGHT LUNG REMOVED   Thrombocytopenia ([place]) 09/25/2013   UNSPECIFIED PERIPHERAL VASCULAR DISEASE 02/18/2011   Qualifier: Diagnosis of  By: Terald Sleeper     Vitamin D deficiency      Significant Hospital Events: Including procedures, antibiotic start and stop dates in addition to other pertinent events   10/21/2021 pulmonary consult, bedside US showed moderate to large pleural effusions.  Interim History / Subjective:   No acute events overnight. Palliative care was in contact with patient's family yesterday.   Patient does report shortness of breath today but is feeling somewhat better.  Objective   Blood pressure 107/60, pulse 70, temperature (!) 97.4 F (36.3 C), temperature source Oral, resp. rate (!) 22, weight 75.6 kg, SpO2 98 %.        Intake/Output Summary (Last 24 hours) at 10/22/2021 1233 Last data filed at 10/22/2021 0500 Gross per 24 hour  Intake 75 ml  Output 1001 ml  Net -926 ml   Filed Weights   10/20/21 0619 10/21/21 0441 10/22/21 0413  Weight: 74 kg 74.1 kg 75.6 kg    Examination: General: elderly male, frail appearing HENT: [place]/AT, poor dentition, moist mucous membranes Lungs: course breath sounds, diminished air movement. No  wheezing. Cardiovascular: rrr, no murmurs Abdomen: Diffuse mild tenderness, bowel sounds active, soft Extremities: warm, edema of lower extremities Neuro: alert, difficult to understand his speech, moving extremities    right  right  left  Resolved Hospital Problem list     Assessment & Plan:  Acute Hypoxemic Respiratory  Failure COPD Aspiration Pneumonia of Left Lung Bilateral Pleural Effusions, L > R Failure to Thrive Severe protein Calorie Malnutrition Hypoalbuminemic  Unstageable Sacral Wounds C. Diff Colitis Acute Kidney Injury Atrial Fibrillation  Impression: Patient appears to have aspirated into the left lung between 11/11 and 11/15 based on chest imaging which led to his increase in oxygen requirements and respiratory distress requiring bipap therapy on 11/14 to 11/15. He has been weaned down to 2L O2 today. The bilateral pleural effusions do not appear to be contributing significantly to his oxygenation needs at this time.  Plan: - Will start albumin 25g q6hrs for 4 doses - Continue lasix 40mg  BID - Will avoid thoracentesis at this time but continue to hold eliquis if he should end up needing thoracentesis.  - Would recommend cortrak placement for initiation of tubefeeds for nutrition purposes if family wishes to pursue aggressive medical treatments.  - I have added flagyl IV for aspiration pneumonia coverage which will also aid in his c. Diff colitis treatment.  - Stopped inhaler therapy, added duonebs q6hrs - Start flutter valve therapy and chest PT via RT for secretion clearance of the left lung  Rest of care management per primary team.   PCCM will continue to follow.    Best Practice (right click and "Reselect all SmartList Selections" daily)   Diet/type: NPO DVT prophylaxis: DOAC GI prophylaxis: PPI Lines: N/A Foley:  N/A Code Status:  DNR Last date of multidisciplinary goals of care discussion [tbd]  Labs   CBC: Recent Labs  Lab 10/08/2021 0845 10/18/21 0223 10/19/21 0240 10/20/21 0225 10/21/21 0922  WBC 19.1* 12.8* 10.9* 10.0 13.5*  NEUTROABS 15.7*  --  7.4 6.7 11.2*  HGB 10.5* 7.4* 8.1* 8.9* 9.7*  HCT 33.0* 23.4* 25.6* 27.5* 31.4*  MCV 93.8 94.0 94.5 92.0 94.6  PLT 246 196 205 242 297    Basic Metabolic Panel: Recent Labs  Lab 10/16/2021 1653 10/18/21 0223  10/18/21 0919 10/19/21 0240 10/20/21 0225 10/21/21 0922  NA  --  138 140 136 138 136  K  --  3.0* 3.4* 3.5 3.3* 4.1  CL  --  112* 114* 111 114* 111  CO2  --  14* 15* 18* 19* 20*  GLUCOSE  --  56* 77 119* 103* 110*  BUN  --  42* 41* 36* 33* 37*  CREATININE  --  2.01* 2.07* 1.74* 1.64* 1.45*  CALCIUM  --  7.2* 7.4* 7.4* 7.7* 7.9*  MG 1.1*  --  1.5* 2.1  --   --   PHOS  --   --   --  2.7  --   --    GFR: Estimated Creatinine Clearance: 41.3 mL/min (A) (by C-G formula based on SCr of 1.45 mg/dL (H)). Recent Labs  Lab 10/27/2021 0845 10/18/21 0223 10/19/21 0240 10/20/21 0225 10/21/21 0922 10/22/21 0130  PROCALCITON  --   --   --   --  0.13 0.64  WBC 19.1* 12.8* 10.9* 10.0 13.5*  --   LATICACIDVEN 1.1  --   --   --   --   --     Liver Function Tests: Recent Labs  Lab 11/04/2021 0845 10/20/21 0225  AST 10* 14*  ALT 9 9  ALKPHOS 78 61  BILITOT 1.0 0.7  PROT 5.0* 5.0*  ALBUMIN 1.8* 2.2*   No results for input(s): LIPASE, AMYLASE in the last 168 hours. No results for input(s): AMMONIA in the last 168 hours.  ABG    Component Value Date/Time   PHART 7.264 (L) 10/21/2021 0406   PCO2ART 40.0 10/21/2021 0406   PO2ART 70.8 (L) 10/21/2021 0406   HCO3 17.6 (L) 10/21/2021 0406   TCO2 23 09/19/2013 1230   ACIDBASEDEF 8.2 (H) 10/21/2021 0406   O2SAT 92.8 10/21/2021 0406     Coagulation Profile: Recent Labs  Lab 10/27/2021 0845  INR 1.3*    Cardiac Enzymes: No results for input(s): CKTOTAL, CKMB, CKMBINDEX, TROPONINI in the last 168 hours.  HbA1C: Hgb A1c MFr Bld  Date/Time Value Ref Range Status  09/20/2013 05:00 AM 5.0 <5.7 % Final    Comment:    (NOTE)                                                                       According to the ADA Clinical Practice Recommendations for 2011, when HbA1c is used as a screening test:  >=6.5%   Diagnostic of Diabetes Mellitus           (if abnormal result is confirmed) 5.7-6.4%   Increased risk of developing Diabetes  [place] References:Diagnosis and Classification of Diabetes Mellitus,Diabetes Care,2011,34(Suppl 1):S62-S69 and Standards of Medical Care in         Diabetes - 2011,Diabetes WIOX,7353,29 (Suppl 1):S11-S61.  09/19/2013 07:35 PM 5.2 <5.7 % Final    Comment:    (NOTE)                                                                       According to the ADA Clinical Practice Recommendations for 2011, when HbA1c is used as a screening test:  >=6.5%   Diagnostic of Diabetes Mellitus           (if abnormal result is confirmed) 5.7-6.4%   Increased risk of developing Diabetes Mellitus References:Diagnosis and Classification of Diabetes Mellitus,Diabetes JMEQ,6834,19(QQIWL 1):S62-S69 and Standards of Medical Care in         Diabetes - 2011,Diabetes NLGX,2119,41 (Suppl 1):S11-S61.    CBG: Recent Labs  Lab 10/19/21 1317 10/19/21 1721 10/20/21 0820 10/20/21 1208 10/20/21 1622  GLUCAP 113* 117* 101* 111* 126*    Critical care time:     Freda Jackson, MD [facility] Office: [phone]   See Amion for personal pager PCCM on call pager [phone] until 7pm. Please call Elink 7p-7a. [phone]

## 2021-10-22 NOTE — Progress Notes (Signed)
Daily Progress Note   Patient Name: Matthew Mahoney       Date: 10/22/2021 DOB: 1936-09-20  Age: 85 y.o. MRN#: [id number] Attending Physician: Tawni Millers Primary Care Physician: [name], MD Admit Date: 10/28/2021  Reason for Consultation/Follow-up: Establishing goals of care  11:52 - I spoke with patient's sister by phone. I made her aware that this morning patient had a 17 beat run of V-tach this morning, which is a very serious and life-threatening cardiac arrhythmia. I also made her aware that his oxygen requirements have increased. [name] states that she and her sister are coming to the hospital today. I offer to meet her at bedside when she arrives.   14:25 - Notified by RN (via secure chat) that both sisters were at bedside. Also that oxygen had been decreased to 2L. Unfortunately, I was in other family meetings and by the time I saw the message it was after 4pm and family had already left. RN  Also reports that she attempted to feed him spaghetti (that she chopped up) and he began coughing after only 3 bites.   17:30 - I spoke with [name] again by phone. She feels that her brother "looked better" than when he was admitted. She reports he was able to speak with them today. I acknowledged that there may be some improvement, and that he may not be imminently at EOL. However, he is still on a path of decline and we need to discuss the best way to support him moving forward. [name] states that she and her sister are returning to tomorrow and is agreeable to meet in-person then.     Length of Stay: 5   Physical Exam Vitals reviewed.  Constitutional:      General: He is not in acute distress.    Appearance: He is ill-appearing.  Pulmonary:     Effort: Pulmonary effort is  normal. Tachypnea present.  Neurological:     Mental Status: He is alert.     Motor: Weakness present.  Psychiatric:        Cognition and Memory: Cognition is impaired.            Vital Signs: BP 107/60 (BP Location: Left Arm)   Pulse 70   Temp (!) 97.4 F (36.3 C) (Oral)   Resp (!) 22  Wt 75.6 kg   SpO2 98%   BMI 22.60 kg/m  SpO2: SpO2: 98 % O2 Device: O2 Device: Nasal Cannula     LBM: Last BM Date: 10/22/21 Baseline Weight: Weight: 70.9 kg Most recent weight: Weight: 75.6 kg       Palliative Assessment/Data: PPS 20%      Palliative Care Assessment & Plan   HPI/Patient Profile: 85 y.o. male  with multiple medical issues including chronic combined heart failure, CKD stage 3b, COPD, CAD, and MVR who presented to the emergency department from SNF on 11/05/2021 with diarrhea.  Per nursing home staff, patient had been having loose stools for the last 2 weeks and they called EMS due to concern for possible sepsis. He was found to be In the ED, CT abdomen showed inflammatory stranding of the rectosigmoid colon concerning for colitis. He was found positive for C.difficile. He was initially hypotensive and treated with IV fluids. He subsequently developed large bilateral pleural effusions.  On the evening of 11/14 he developed acute respiratory failure requiring BiPAP.   Assessment: - C. Difficile colitis, acute - hypotension - acute hypoxic respiratory failure - AKI - history of CHF - severe protein calorie malnutrition  Recommendations/Plan: Continue current care SLP evaluation - patient is likely aspirating I will meet with sisters tomorrow at bedside at 2 pm  Goals of Care and Additional Recommendations: Limitations on Scope of Treatment: Full Scope Treatment  Code Status: DNR/DNI   Prognosis:  Poor overall  Discharge Planning: To Be Determined    Thank you for allowing the Palliative Medicine Team to assist in the care of this patient.   Total Time  25 minutes Prolonged Time Billed  no       Greater than 50%  of this time was spent counseling and coordinating care related to the above assessment and plan.  Lavena Bullion, NP  Please contact Palliative Medicine Team phone at [phone] for questions and concerns.

## 2021-10-22 NOTE — Progress Notes (Addendum)
Pt is more alert and cooperative tonight, but disoriented x 3. While Pt is comfortably sleeping. BP 79/61-97/56 mmHg. HR 60s - 70s.  RR 22-27, SPO2 98-100% on 7 LPM of O2 NCL.  Dr. Bridgett Larsson is notified. No new order at this moment. We will continue to monitor.  Kennyth Lose, RN

## 2021-10-23 ENCOUNTER — Other Ambulatory Visit (HOSPITAL_COMMUNITY): Payer: Self-pay

## 2021-10-23 DIAGNOSIS — J9601 Acute respiratory failure with hypoxia: Secondary | ICD-10-CM | POA: Diagnosis not present

## 2021-10-23 DIAGNOSIS — E43 Unspecified severe protein-calorie malnutrition: Secondary | ICD-10-CM | POA: Diagnosis not present

## 2021-10-23 DIAGNOSIS — E876 Hypokalemia: Secondary | ICD-10-CM | POA: Diagnosis not present

## 2021-10-23 DIAGNOSIS — Z8679 Personal history of other diseases of the circulatory system: Secondary | ICD-10-CM | POA: Diagnosis not present

## 2021-10-23 DIAGNOSIS — A0472 Enterocolitis due to Clostridium difficile, not specified as recurrent: Secondary | ICD-10-CM | POA: Diagnosis not present

## 2021-10-23 DIAGNOSIS — J90 Pleural effusion, not elsewhere classified: Secondary | ICD-10-CM | POA: Diagnosis not present

## 2021-10-23 LAB — BASIC METABOLIC PANEL
Anion gap: 9 (ref 5–15)
BUN: 44 mg/dL — ABNORMAL HIGH (ref 8–23)
CO2: 19 mmol/L — ABNORMAL LOW (ref 22–32)
Calcium: 8.4 mg/dL — ABNORMAL LOW (ref 8.9–10.3)
Chloride: 110 mmol/L (ref 98–111)
Creatinine, Ser: 1.41 mg/dL — ABNORMAL HIGH (ref 0.61–1.24)
GFR, Estimated: 49 mL/min — ABNORMAL LOW (ref >60)
Glucose, Bld: 108 mg/dL — ABNORMAL HIGH (ref 70–99)
Potassium: 4.1 mmol/L (ref 3.5–5.1)
Sodium: 138 mmol/L (ref 135–145)

## 2021-10-23 LAB — CBC
HCT: 24.4 % — ABNORMAL LOW (ref 39.0–52.0)
Hemoglobin: 7.8 g/dL — ABNORMAL LOW (ref 13.0–17.0)
MCH: 29.7 pg (ref 26.0–34.0)
MCHC: 32 g/dL (ref 30.0–36.0)
MCV: 92.8 fL (ref 80.0–100.0)
Platelets: 198 10*3/uL (ref 150–400)
RBC: 2.63 MIL/uL — ABNORMAL LOW (ref 4.22–5.81)
RDW: 14.6 % (ref 11.5–15.5)
WBC: 11.5 10*3/uL — ABNORMAL HIGH (ref 4.0–10.5)
nRBC: 0 % (ref 0.0–0.2)

## 2021-10-23 MED ORDER — LORAZEPAM 2 MG/ML IJ SOLN: 1.0000 mg | INTRAMUSCULAR | Status: DC | PRN

## 2021-10-23 MED ORDER — MORPHINE SULFATE (PF) 2 MG/ML IV SOLN: 1.0000 mg | INTRAVENOUS | Status: DC | PRN

## 2021-10-23 MED ORDER — HALOPERIDOL LACTATE 2 MG/ML PO CONC
0.5000 mg | ORAL | Status: DC | PRN
  Filled 2021-10-23: qty 0.3

## 2021-10-23 MED ORDER — ACETAMINOPHEN 325 MG PO TABS: 650.0000 mg | ORAL_TABLET | Freq: Four times a day (QID) | ORAL | Status: DC | PRN

## 2021-10-23 MED ORDER — HALOPERIDOL 0.5 MG PO TABS
0.5000 mg | ORAL_TABLET | ORAL | Status: DC | PRN
  Filled 2021-10-23: qty 1

## 2021-10-23 MED ORDER — GLYCOPYRROLATE 0.2 MG/ML IJ SOLN
0.2000 mg | INTRAMUSCULAR | Status: DC | PRN
  Administered 2021-10-23 – 2021-10-24 (×3): 0.2 mg via INTRAVENOUS
  Filled 2021-10-23 (×3): qty 1

## 2021-10-23 MED ORDER — HALOPERIDOL LACTATE 5 MG/ML IJ SOLN: 0.5000 mg | INTRAMUSCULAR | Status: DC | PRN

## 2021-10-23 MED ORDER — LORAZEPAM 1 MG PO TABS: 1.0000 mg | ORAL_TABLET | ORAL | Status: DC | PRN

## 2021-10-23 MED ORDER — BIOTENE DRY MOUTH MT LIQD: 15.0000 mL | OROMUCOSAL | Status: DC | PRN

## 2021-10-23 MED ORDER — POLYVINYL ALCOHOL 1.4 % OP SOLN
1.0000 [drp] | Freq: Four times a day (QID) | OPHTHALMIC | Status: DC | PRN
  Filled 2021-10-23: qty 15

## 2021-10-23 MED ORDER — ONDANSETRON 4 MG PO TBDP: 4.0000 mg | ORAL_TABLET | Freq: Four times a day (QID) | ORAL | Status: DC | PRN

## 2021-10-23 MED ORDER — MORPHINE SULFATE (CONCENTRATE) 10 MG/0.5ML PO SOLN
5.0000 mg | Freq: Four times a day (QID) | ORAL | Status: DC
  Administered 2021-10-23 – 2021-10-24 (×4): 5 mg via ORAL
  Filled 2021-10-23 (×4): qty 0.5

## 2021-10-23 MED ORDER — GLYCOPYRROLATE 1 MG PO TABS
1.0000 mg | ORAL_TABLET | ORAL | Status: DC | PRN
  Filled 2021-10-23: qty 1

## 2021-10-23 MED ORDER — LORAZEPAM 2 MG/ML PO CONC: 1.0000 mg | ORAL | Status: DC | PRN

## 2021-10-23 MED ORDER — ONDANSETRON HCL 4 MG/2ML IJ SOLN: 4.0000 mg | Freq: Four times a day (QID) | INTRAMUSCULAR | Status: DC | PRN

## 2021-10-23 MED ORDER — IPRATROPIUM-ALBUTEROL 0.5-2.5 (3) MG/3ML IN SOLN: 3.0000 mL | Freq: Four times a day (QID) | RESPIRATORY_TRACT | Status: DC | PRN

## 2021-10-23 MED ORDER — GLYCOPYRROLATE 0.2 MG/ML IJ SOLN: 0.2000 mg | INTRAMUSCULAR | Status: DC | PRN

## 2021-10-23 MED ORDER — ACETAMINOPHEN 650 MG RE SUPP: 650.0000 mg | Freq: Four times a day (QID) | RECTAL | Status: DC | PRN

## 2021-10-23 NOTE — Progress Notes (Signed)
NAME:  Matthew Mahoney, MRN:  [id number], DOB:  1936/06/01, LOS: 6 ADMISSION DATE:  10/30/2021, CONSULTATION DATE: 08/21/2021 REFERRING MD: [facility], CHIEF COMPLAINT: Bilateral pleural effusions  History of Present Illness:  85 year old nursing home patient with unstageable sacral wounds plethora of health issues that are well documented below who presented with a new diagnosis of C. difficile colitis he has extensive cardiac history including coronary artery disease mitral regurgitation nonsustained ventricular tachycardia.  On admission he was hypotensive and treated with fluids.  He developed large bilateral pleural effusions almost overnight on 11/14 along with increasing O2 needs and placed on bipap.  Interventional radiology did not want to do thoracentesis due to his being unstable.  Pulmonary critical care called to the bedside for for possible thoracentesis.  We have stopped his Eliquis at this time.  We will attempt diuresis as tolerated.  And plan on doing thoracentesis in coming days if still indicated.  Pertinent  Medical History   Past Medical History:  Diagnosis Date   Alcohol abuse 01/21/2013   Ascites    HISTORY   CAD in native artery    Choledocholithiasis    Chronic combined systolic and diastolic congestive heart failure ([place]) 05/06/2015   Chronic kidney disease, stage 3b (HCC)    Conduction disorder of the heart 09/25/2013   COPD (chronic obstructive pulmonary disease) (HCC)    CVA (cerebral infarction) 09/19/2013   GERD (gastroesophageal reflux disease) 10/11/2013   GI bleed    a. 03/2013 a/w hemorrhagic shock - EGD on 03/1013, which revealed active bleeding from proximal duodenum, with complete hemostasis following epinephrine injection therapy. At least 2 duodenal ulcers identified.   Hemorrhagic shock ([place]) 03/16/2013   History of colonic diverticulitis    Hyperkalemia    Hyperlipidemia    Hypertension    Hypocalcemia    Ichthyosis    Lymphadenopathy    Mild  carotid artery disease (HCC)    Mitral regurgitation    Nonsustained ventricular tachycardia 05/09/2015   Olecranon bursitis    Onychomycosis    PAD (peripheral artery disease) (HCC)    a. suspected by noninvasive testing -> conservative rx.   Permanent atrial fibrillation ([place]) 09/23/2013   Prostate cancer ([place])    Status post lung surgery 12/07/2001   BENIGHN LESION ON RIGHT LUNG REMOVED   Thrombocytopenia ([place]) 09/25/2013   UNSPECIFIED PERIPHERAL VASCULAR DISEASE 02/18/2011   Qualifier: Diagnosis of  By: Terald Sleeper     Vitamin D deficiency      Significant Hospital Events: Including procedures, antibiotic start and stop dates in addition to other pertinent events   10/21/2021 pulmonary consult, bedside US showed moderate to large pleural effusions.  Interim History / Subjective:   No acute events overnight. Palliative care remains in contact with patient's family.   Patient has been weaned to room air this morning. He is without complaints and is wanting to get out of bed.  Objective   Blood pressure (!) 115/51, pulse (!) 37, temperature 97.7 F (36.5 C), temperature source Oral, resp. rate (!) 23, weight 76.9 kg, SpO2 93 %.        Intake/Output Summary (Last 24 hours) at 10/23/2021 0804 Last data filed at 10/23/2021 0334 Gross per 24 hour  Intake 76.35 ml  Output 950 ml  Net -873.65 ml   Filed Weights   10/21/21 0441 10/22/21 0413 10/23/21 0333  Weight: 74.1 kg 75.6 kg 76.9 kg    Examination: General: elderly male, chronically ill appearing HENT: [place]/AT, poor dentition, moist  mucous membranes Lungs: diminished breath sounds bilaterally. No wheezing. Cardiovascular: rrr, no murmurs Abdomen: soft, non-tender, bowel sounds active Extremities: warm, no edema Neuro: alert, speech is more clear today, moving extremities  Resolved Hospital Problem list     Assessment & Plan:  Acute Hypoxemic Respiratory Failure COPD Aspiration Pneumonia of Left  Lung Bilateral Pleural Effusions, L > R Failure to Thrive Severe protein Calorie Malnutrition Hypoalbuminemic  Unstageable Sacral Wounds C. Diff Colitis Acute Kidney Injury Atrial Fibrillation  Impression: Patient appears to have aspirated into the left lung between 11/11 and 11/15 based on chest imaging which led to his increase in oxygen requirements and respiratory distress requiring bipap therapy on 11/14 to 11/15. He has been weaned off supplemental O2 today. The bilateral pleural effusions do not appear to be contributing significantly to his oxygenation needs at this time.  Plan: - Diuresis PRN - No role for thoracentesis at this time. Can resume anticoagulation for atrial fibrillation if needed, would recommend lovenox if able incase future need for thoracentesis. He can be restarted on eliquis upon discharge. - Would recommend cortrak placement for initiation of tubefeeds for nutrition purposes if family wishes to pursue aggressive medical treatments.  - Speech evaluation pending for concern of aspiration. - Continue flagyl for aspiration pneumonia coverage which will also aid in his c. Diff colitis treatment. Recommend 7 day course for pneumonia. - Continue duonebs q6hrs - Continue flutter valve therapy and chest PT via RT for secretion clearance of the left lung  Rest of care management per primary team.   PCCM will sign off. Call with any questions.  Labs   CBC: Recent Labs  Lab 10/18/2021 0845 10/18/21 0223 10/19/21 0240 10/20/21 0225 10/21/21 0922 10/23/21 0059  WBC 19.1* 12.8* 10.9* 10.0 13.5* 11.5*  NEUTROABS 15.7*  --  7.4 6.7 11.2*  --   HGB 10.5* 7.4* 8.1* 8.9* 9.7* 7.8*  HCT 33.0* 23.4* 25.6* 27.5* 31.4* 24.4*  MCV 93.8 94.0 94.5 92.0 94.6 92.8  PLT 246 196 205 242 278 209    Basic Metabolic Panel: Recent Labs  Lab 10/18/2021 1653 10/18/21 0223 10/18/21 0919 10/19/21 0240 10/20/21 0225 10/21/21 0922 10/23/21 0059  NA  --    < > 140 136 138 136 138   K  --    < > 3.4* 3.5 3.3* 4.1 4.1  CL  --    < > 114* 111 114* 111 110  CO2  --    < > 15* 18* 19* 20* 19*  GLUCOSE  --    < > 77 119* 103* 110* 108*  BUN  --    < > 41* 36* 33* 37* 44*  CREATININE  --    < > 2.07* 1.74* 1.64* 1.45* 1.41*  CALCIUM  --    < > 7.4* 7.4* 7.7* 7.9* 8.4*  MG 1.1*  --  1.5* 2.1  --   --   --   PHOS  --   --   --  2.7  --   --   --    < > = values in this interval not displayed.   GFR: Estimated Creatinine Clearance: 43.2 mL/min (A) (by C-G formula based on SCr of 1.41 mg/dL (H)). Recent Labs  Lab 10/14/2021 0845 10/18/21 0223 10/19/21 0240 10/20/21 0225 10/21/21 0922 10/22/21 0130 10/23/21 0059  PROCALCITON  --   --   --   --  0.13 0.64  --   WBC 19.1*   < > 10.9* 10.0 13.5*  --  11.5*  LATICACIDVEN 1.1  --   --   --   --   --   --    < > = values in this interval not displayed.    Liver Function Tests: Recent Labs  Lab 10/07/2021 0845 10/20/21 0225  AST 10* 14*  ALT 9 9  ALKPHOS 78 61  BILITOT 1.0 0.7  PROT 5.0* 5.0*  ALBUMIN 1.8* 2.2*   No results for input(s): LIPASE, AMYLASE in the last 168 hours. No results for input(s): AMMONIA in the last 168 hours.  ABG    Component Value Date/Time   PHART 7.264 (L) 10/21/2021 0406   PCO2ART 40.0 10/21/2021 0406   PO2ART 70.8 (L) 10/21/2021 0406   HCO3 17.6 (L) 10/21/2021 0406   TCO2 23 09/19/2013 1230   ACIDBASEDEF 8.2 (H) 10/21/2021 0406   O2SAT 92.8 10/21/2021 0406     Coagulation Profile: Recent Labs  Lab 10/23/2021 0845  INR 1.3*    Cardiac Enzymes: No results for input(s): CKTOTAL, CKMB, CKMBINDEX, TROPONINI in the last 168 hours.  HbA1C: Hgb A1c MFr Bld  Date/Time Value Ref Range Status  09/20/2013 05:00 AM 5.0 <5.7 % Final    Comment:    (NOTE)                                                                       According to the ADA Clinical Practice Recommendations for 2011, when HbA1c is used as a screening test:  >=6.5%   Diagnostic of Diabetes Mellitus            (if abnormal result is confirmed) 5.7-6.4%   Increased risk of developing Diabetes Mellitus References:Diagnosis and Classification of Diabetes Mellitus,Diabetes Care,2011,34(Suppl 1):S62-S69 and Standards of Medical Care in         Diabetes - 2011,Diabetes WIOM,3559,74 (Suppl 1):S11-S61.  09/19/2013 07:35 PM 5.2 <5.7 % Final    Comment:    (NOTE)                                                                       According to the ADA Clinical Practice Recommendations for 2011, when HbA1c is used as a screening test:  >=6.5%   Diagnostic of Diabetes Mellitus           (if abnormal result is confirmed) 5.7-6.4%   Increased risk of developing Diabetes Mellitus References:Diagnosis and Classification of Diabetes Mellitus,Diabetes BULA,4536,46(OEHOZ 1):S62-S69 and Standards of Medical Care in         Diabetes - 2011,Diabetes YYQM,2500,37 (Suppl 1):S11-S61.    CBG: Recent Labs  Lab 10/19/21 1317 10/19/21 1721 10/20/21 0820 10/20/21 1208 10/20/21 1622  GLUCAP 113* 117* 101* 111* 126*    Critical care time:     Freda Jackson, MD [facility] Office: [phone]   See Amion for personal pager PCCM on call pager [phone] until 7pm. Please call Elink 7p-7a. [phone]

## 2021-10-23 NOTE — Progress Notes (Signed)
PROGRESS NOTE    Matthew Mahoney  ZOX:[id number] DOB: [birth year] DOA: 10/10/2021 PCP: [name], MD    Brief Narrative:  [name] was admitted to the hospital with the working diagnosis of acute C diff colitis, complicated with hypovolemic shock.    85 year old male past medical history for atrial fibrillation, heart failure, COPD, hypertension, dyslipidemia, peripheral vascular disease and prostate cancer who presented with diarrhea.  He had a recent hospitalization 10/12-10/19/2022 for right lower extremity cellulitis.  His hospitalization was complicated by atrial fibrillation with rapid ventricular response and worsening anemia.  She was diagnosed with erosive gastritis.  He reported 2 weeks of diarrhea, associated with abdominal pain.  Because of his rapid worsening condition EMS was called and patient was brought to the hospital.  On his initial physical examination his blood pressure was 97/47, heart rate 83, respirate 24, temperature 97.1, oxygen saturation 99%.  Patient was lethargic, oriented only times person and place, his lungs had rales bilaterally, heart S1-S2, irregularly irregular, abdomen tender to palpation, no lower extremity edema.  Positive sacral ulcer with surrounding erythema.   Sodium 140, potassium 2.4, chloride 107, bicarb 20, glucose 87, BUN 47, creatinine 2.17, AST 10, ALT 9, white cell count 19.1, hemoglobin 10.5, hematocrit 33.0, platelets 246. SARS COVID-19 negative.   C. difficile was positive antigen, positive.    Chest radiograph with bibasilar atelectasis.   EKG 87 bpm, left axis deviation, QTC 501, right bundle branch block, atrial fibrillation rhythm, positive PVCs, no significant ST segment or T wave changes.  Low voltage.   Patient received aggressive fluid resuscitation, antibiotic therapy with oral vancomycin.   Patient developed acute hypoxic respiratory failure, follow-up chest radiograph showed worsening pleural effusions more right  than left. He required non invasive mechanical ventilation.   Improved oxygenation with diuresis.   Patient continue to be very weak and deconditioned, poor oral intake, positive confusion.   Assessment & Plan:   Principal Problem:   C. difficile colitis Active Problems:   Tobacco abuse   Pressure injury of skin   Protein-calorie malnutrition, severe   Hypotension   Leukocytosis   Hypokalemia   History of CHF (congestive heart failure)   Bilateral pleural effusion   C diff diarrhea and colitis, complicated with hypovolemic shock.  His blood pressure has improved now 113 to 811 mmHg systolic.    Plan to continue with oral vancomycin for 14 days total.  Ok to discontinue telemetry and change level of care to med surg.    2. Acute hypoxemic respiratory failure due to volume overload pulmonary edema, acute on chronic diastolic heart failure decompensation, left pleural effusion.  Pulmonary hypertension (ruled out systolic heart failure).  Improvement in oxygenation, today is 92% on room air and has no signs of dyspnea.  Continue with diuresis as needed.   Echocardiogram with preserved LV systolic function EF 91%, with reduced RV systolic function, moderately enlarged cavity, increased pulmonary artery systolic pressure 47,8 mmHg.  Severe dilatation of bilateral atriums.    3. Aki on CKD stage 3a. hypokalemia.  Blood pressure is more stable today, his renal function has a serum cr of 1,41 with K at 4,1 and serum bicarbonate at 19. Plan to continue close follow up on renal function and electrolytes.  Avoid hypotension and nephrotoxic medications. Diuresis as needed.    4. Severe protein calorie malnutrition. Stage 2 pressure ulcer at the sacrum (present on admission), unable to stage pressure injury coccyx (not present on admission) On Nutritional supplementation. Patient  is very weak and deconditioned.    5. GERD. On pantoprazole   6. Dyslipidemia. On statin therapy.    Status is: Inpatient  Remains inpatient appropriate because: hemodynamic monitoring   DVT prophylaxis: Enoxaparin   Code Status:    DNR   Family Communication:   No family at the bedside      Nutrition Status: Nutrition Problem: Severe Malnutrition Etiology: chronic illness (CHF and COPD) Signs/Symptoms: severe fat depletion, severe muscle depletion Interventions: Ensure Enlive (each supplement provides 350kcal and 20 grams of protein), Juven, MVI     Skin Documentation: Pressure Injury 09/17/21 Buttocks Medial;Upper;Lower Stage 2 -  Partial thickness loss of dermis presenting as a shallow open injury with a red, pink wound bed without slough. redness (Active)  09/17/21 1859  Location: [facility]  Location Orientation: Medial;Upper;Lower  Staging: Stage 2 -  Partial thickness loss of dermis presenting as a shallow open injury with a red, pink wound bed without slough.  Wound Description (Comments): redness  Present on Admission: Yes     Pressure Injury 10/12/2021 Sacrum Medial Stage 2 -  Partial thickness loss of dermis presenting as a shallow open injury with a red, pink wound bed without slough. (Active)  10/19/2021 1600  Location: Sacrum  Location Orientation: Medial  Staging: Stage 2 -  Partial thickness loss of dermis presenting as a shallow open injury with a red, pink wound bed without slough.  Wound Description (Comments):   Present on Admission: Yes     Pressure Injury 10/26/2021 Heel Left purplish red (Active)  10/16/2021 2130  Location: Heel  Location Orientation: Left  Staging:   Wound Description (Comments): purplish red  Present on Admission: Yes     Pressure Injury 10/15/2021 Heel Right red (Active)  10/19/2021 2130  Location: Heel  Location Orientation: Right  Staging:   Wound Description (Comments): red  Present on Admission:      Pressure Injury 10/22/21 Coccyx Medial Unstageable - Full thickness tissue loss in which the base of the injury is covered by  slough (yellow, tan, gray, green or brown) and/or eschar (tan, brown or black) in the wound bed. (Active)  10/22/21 1000  Location: Coccyx  Location Orientation: Medial  Staging: Unstageable - Full thickness tissue loss in which the base of the injury is covered by slough (yellow, tan, gray, green or brown) and/or eschar (tan, brown or black) in the wound bed.  Wound Description (Comments):   Present on Admission: No    Antimicrobials:  Oral vancomycin     Subjective: Patient with no nausea or vomiting, poor oral intake, continue to be very weak and deconditioned, has been confused but not agitated, has mittens in place.   Objective: Vitals:   10/23/21 0600 10/23/21 0756 10/23/21 0835 10/23/21 1130  BP:   (!) 130/55 123/75  Pulse: (!) 37  72 71  Resp: (!) 23  20 18   Temp:   97.6 F (36.4 C) (!) 97.5 F (36.4 C)  TempSrc:   Oral Oral  SpO2: 91% 93% 93% 92%  Weight:        Intake/Output Summary (Last 24 hours) at 10/23/2021 1431 Last data filed at 10/23/2021 0900 Gross per 24 hour  Intake 96.35 ml  Output 950 ml  Net -853.65 ml   Filed Weights   10/21/21 0441 10/22/21 0413 10/23/21 0333  Weight: 74.1 kg 75.6 kg 76.9 kg    Examination:   General: Not in pain or dyspnea  Neurology: Awake and alert, non focal  E  ENT: mild pallor, no icterus, oral mucosa moist Cardiovascular: No JVD. S1-S2 present, rhythmic, no gallops, rubs, or murmurs.  Trace lower extremity edema. Pulmonary: positive breath sounds bilaterally, with no wheezing, rhonchi or rales. Mild decreased breath sounds at bases.  Gastrointestinal. Abdomen soft and non tender Skin. No rashes Musculoskeletal: no joint deformities     Data Reviewed: I have personally reviewed following labs and imaging studies  CBC: Recent Labs  Lab 10/25/2021 0845 10/18/21 0223 10/19/21 0240 10/20/21 0225 10/21/21 0922 10/23/21 0059  WBC 19.1* 12.8* 10.9* 10.0 13.5* 11.5*  NEUTROABS 15.7*  --  7.4 6.7 11.2*  --   HGB  10.5* 7.4* 8.1* 8.9* 9.7* 7.8*  HCT 33.0* 23.4* 25.6* 27.5* 31.4* 24.4*  MCV 93.8 94.0 94.5 92.0 94.6 92.8  PLT 246 196 205 242 278 782   Basic Metabolic Panel: Recent Labs  Lab 10/15/2021 1653 10/18/21 0223 10/18/21 0919 10/19/21 0240 10/20/21 0225 10/21/21 0922 10/23/21 0059  NA  --    < > 140 136 138 136 138  K  --    < > 3.4* 3.5 3.3* 4.1 4.1  CL  --    < > 114* 111 114* 111 110  CO2  --    < > 15* 18* 19* 20* 19*  GLUCOSE  --    < > 77 119* 103* 110* 108*  BUN  --    < > 41* 36* 33* 37* 44*  CREATININE  --    < > 2.07* 1.74* 1.64* 1.45* 1.41*  CALCIUM  --    < > 7.4* 7.4* 7.7* 7.9* 8.4*  MG 1.1*  --  1.5* 2.1  --   --   --   PHOS  --   --   --  2.7  --   --   --    < > = values in this interval not displayed.   GFR: Estimated Creatinine Clearance: 43.2 mL/min (A) (by C-G formula based on SCr of 1.41 mg/dL (H)). Liver Function Tests: Recent Labs  Lab 10/19/2021 0845 10/20/21 0225  AST 10* 14*  ALT 9 9  ALKPHOS 78 61  BILITOT 1.0 0.7  PROT 5.0* 5.0*  ALBUMIN 1.8* 2.2*   No results for input(s): LIPASE, AMYLASE in the last 168 hours. No results for input(s): AMMONIA in the last 168 hours. Coagulation Profile: Recent Labs  Lab 10/22/2021 0845  INR 1.3*   Cardiac Enzymes: No results for input(s): CKTOTAL, CKMB, CKMBINDEX, TROPONINI in the last 168 hours. BNP (last 3 results) No results for input(s): PROBNP in the last 8760 hours. HbA1C: No results for input(s): HGBA1C in the last 72 hours. CBG: Recent Labs  Lab 10/19/21 1317 10/19/21 1721 10/20/21 0820 10/20/21 1208 10/20/21 1622  GLUCAP 113* 117* 101* 111* 126*   Lipid Profile: No results for input(s): CHOL, HDL, LDLCALC, TRIG, CHOLHDL, LDLDIRECT in the last 72 hours. Thyroid Function Tests: No results for input(s): TSH, T4TOTAL, FREET4, T3FREE, THYROIDAB in the last 72 hours. Anemia Panel: No results for input(s): VITAMINB12, FOLATE, FERRITIN, TIBC, IRON, RETICCTPCT in the last 72  hours.    Radiology Studies: I have reviewed all of the imaging during this hospital visit personally     Scheduled Meds:  atorvastatin  40 mg Oral QPM   collagenase   Topical Daily   feeding supplement  237 mL Oral BID BM   guaiFENesin  600 mg Oral BID   ipratropium-albuterol  3 mL Nebulization Q6H   latanoprost  1 drop  Both Eyes QHS   metroNIDAZOLE  500 mg Oral Q12H   multivitamin  1 tablet Oral Daily   nutrition supplement (JUVEN)  1 packet Oral BID BM   pantoprazole  40 mg Oral Daily   saccharomyces boulardii  250 mg Oral BID   sodium chloride flush  3 mL Intravenous Q12H   thiamine  100 mg Oral Daily   vancomycin  125 mg Oral QID   Continuous Infusions:   LOS: 6 days        Verlinda Slotnick Gerome Apley, MD

## 2021-10-23 NOTE — Progress Notes (Signed)
Daily Progress Note   Patient Name: Matthew Mahoney       Date: 10/23/2021 DOB: 01-Feb-1936  Age: 85 y.o. MRN#: [id number] Attending Physician: Tawni Millers Primary Care Physician: [name], MD Admit Date: 10/26/2021   HPI/Patient Profile: 85 y.o. male  with multiple medical issues including chronic combined heart failure, CKD stage 3b, COPD, CAD, and MVR who presented to the emergency department from SNF on 11/03/2021 with diarrhea.  Per nursing home staff, patient had been having loose stools for the last 2 weeks and they called EMS due to concern for possible sepsis. He was found to be In the ED, CT abdomen showed inflammatory stranding of the rectosigmoid colon concerning for colitis. He was found positive for C.difficile. He was initially hypotensive. He has received aggressive fluid resuscitation and antibiotics with oral vancomycin.  subsequently developed large bilateral pleural effusions.  11/14 - developed acute respiratory failure requiring BiPAP 11/15 - CT chest showed complete consolidation and atelectasis involving the left lower lobe and inferior left upper lobe. Findings are worrisome for postobstructive pneumonia. 11/17 - SLP evaluation reveals that patient has dysphagia and moderate risk for aspiration    Subjective: I met with patient's sisters [name] and Tamela Oddi in the 4E waiting area. We reviewed patient's current medical condition and hospital course in great detail. Discussed that although he had improved since admission, he is still ultimately failing to thrive and on a path of decline  Discussed that he did not appear to be doing well in rehab, as evidence by development of a pressure ulcer and his low albumin of 1.8 on admission.   The difference  between full scope medical intervention and comfort care was considered. Reviewed the concept of a comfort path with family, emphasizing that this path involves de-escalating and stopping full scope medical interventions, allowing a natural course to occur. Discussed that the goal is comfort and dignity rather than cure/prolonging life.   Discussed transitioning to comfort care while in the hospital, and what that would look like--keeping him clean and dry, no labs, no artificial hydration or feeding, no antibiotics, minimizing of medications, comfort feeds, medication for pain and dyspnea. Patient's sisters are completely in favor of transition to full comfort care. They verbalizes that "he is tired" and that his current medical condition would not be  consistent with good quality of life.   Education provided on the natural trajectory at EOL. Patient's sisters are hopeful that he can transfer to a hospice facility for EOL care - they stare preference for facility in [facility].    Length of Stay: 6        Vital Signs: BP 123/75 (BP Location: Left Arm)   Pulse 71   Temp (!) 97.5 F (36.4 C) (Oral)   Resp 18   Wt 76.9 kg   SpO2 92%   BMI 22.99 kg/m  SpO2: SpO2: 92 % O2 Device: O2 Device: Room Air O2 Flow Rate: O2 Flow Rate (L/min): 2 L/min       Palliative Assessment/Data: PPS 20%      Palliative Care Assessment & Plan    Assessment: - C. Difficile colitis, acute - hypotension - acute hypoxic respiratory failure - AKI - history of CHF - severe protein calorie malnutrition  Recommendations/Plan: Full comfort measures initiated Family requests transfer to hospice facility in [facility] if eligible- TOC consult placed; TOC and hospice liaison notifed Added orders for symptom management at EOL as well as discontinued orders that were not focused on comfort PRN medications are available for symptom management to ensure EOL comfort PMT will continue to follow   Symptom  Management:  Scheduled Morphine CONCENTRATE 10 mg/0.60m oral solution 5 mg SL every 6 hours PRN Morphine 1 mg IV every 2 hours for breakthrough pain or dyspnea Lorazepam (ATIVAN) prn for anxiety Haloperidol (HALDOL) prn for agitation  Glycopyrrolate (ROBINUL) for excessive secretions Ondansetron (ZOFRAN) prn for nausea Polyvinyl alcohol (LIQUIFILM TEARS) prn for dry eyes Antiseptic oral rinse (BIOTENE) prn for dry mouth  Code Status: DNR/DNI  Prognosis:  He is high risk for acute decline due to aspiration, less than 2 weeks would not be surprising  Discharge Planning: To Be Determined   Thank you for allowing the Palliative Medicine Team to assist in the care of this patient.   Total Time 65 minutes Prolonged Time Billed  yes       Greater than 50%  of this time was spent counseling and coordinating care related to the above assessment and plan.  JLavena Bullion NP  Please contact Palliative Medicine Team phone at [phone] questions and concerns.

## 2021-10-23 NOTE — TOC Benefit Eligibility Note (Signed)
Patient Teacher, English as a foreign language completed.    The patient is currently admitted and upon discharge could be taking Vancomycin 125 mg Capsules.  The current 5 day co-pay is, $25.60.   The patient is currently admitted and upon discharge could be taking Vancomycin 50mg /ml Liquid.  Non Formulary  The patient is insured through [place], [place] Patient Advocate Specialist [place] Patient Advocate Team Direct Number: [phone]  Fax: [phone]

## 2021-10-23 NOTE — Evaluation (Signed)
Clinical/Bedside Swallow Evaluation Patient Details  Name: Matthew Mahoney MRN: [id number] Date of Birth: 14-Nov-1936  Today's Date: 10/23/2021 Time: SLP Start Time (ACUTE ONLY): 0630 SLP Stop Time (ACUTE ONLY): 1601 SLP Time Calculation (min) (ACUTE ONLY): 15 min  Past Medical History:  Past Medical History:  Diagnosis Date   Alcohol abuse 01/21/2013   Ascites    HISTORY   CAD in native artery    Choledocholithiasis    Chronic combined systolic and diastolic congestive heart failure ([place]) 05/06/2015   Chronic kidney disease, stage 3b ([place])    Conduction disorder of the heart 09/25/2013   COPD (chronic obstructive pulmonary disease) (HCC)    CVA (cerebral infarction) 09/19/2013   GERD (gastroesophageal reflux disease) 10/11/2013   GI bleed    a. 03/2013 a/w hemorrhagic shock - EGD on 03/1013, which revealed active bleeding from proximal duodenum, with complete hemostasis following epinephrine injection therapy. At least 2 duodenal ulcers identified.   Hemorrhagic shock ([place]) 03/16/2013   History of colonic diverticulitis    Hyperkalemia    Hyperlipidemia    Hypertension    Hypocalcemia    Ichthyosis    Lymphadenopathy    Mild carotid artery disease (HCC)    Mitral regurgitation    Nonsustained ventricular tachycardia 05/09/2015   Olecranon bursitis    Onychomycosis    PAD (peripheral artery disease) (HCC)    a. suspected by noninvasive testing -> conservative rx.   Permanent atrial fibrillation ([place]) 09/23/2013   Prostate cancer ([place])    Status post lung surgery 12/07/2001   BENIGHN LESION ON RIGHT LUNG REMOVED   Thrombocytopenia ([place]) 09/25/2013   UNSPECIFIED PERIPHERAL VASCULAR DISEASE 02/18/2011   Qualifier: Diagnosis of  By: Terald Sleeper     Vitamin D deficiency    Past Surgical History:  Past Surgical History:  Procedure Laterality Date   BIOPSY  09/22/2021   Procedure: BIOPSY;  Surgeon: Jackquline Denmark, MD;  Location: [facility] ENDOSCOPY;  Service: Endoscopy;;    COLONOSCOPY     ESOPHAGEAL DILATION  09/22/2021   Procedure: ESOPHAGEAL DILATION;  Surgeon: Jackquline Denmark, MD;  Location[facility] ENDOSCOPY;  Service: Endoscopy;;   ESOPHAGOGASTRODUODENOSCOPY N/A 03/16/2013   Procedure: ESOPHAGOGASTRODUODENOSCOPY (EGD);  Surgeon: Cleotis Nipper, MD;  Location: [facility] ENDOSCOPY;  Service: Endoscopy;  Laterality: N/A;  will probably want to do at bedside in ICU---bed placement pending   ESOPHAGOGASTRODUODENOSCOPY (EGD) WITH PROPOFOL N/A 09/22/2021   Procedure: ESOPHAGOGASTRODUODENOSCOPY (EGD) WITH PROPOFOL;  Surgeon: Jackquline Denmark, MD;  Location: [facility] ENDOSCOPY;  Service: Endoscopy;  Laterality: N/A;   FEMUR IM NAIL Left 01/21/2013   Procedure: INTRAMEDULLARY (IM) NAIL FEMORAL;  Surgeon: Hessie Dibble, MD;  Location: [facility] ORS;  Service: Orthopedics;  Laterality: Left;   HEMORRHOID SURGERY  12/07/1998   HIP PINNING,CANNULATED Right 05/07/2015   Procedure: CANNULATED HIP PINNING;  Surgeon: Phylliss Bob, MD;  Location: [facility] ORS;  Service: Orthopedics;  Laterality: Right;   LOOP RECORDER IMPLANT N/A 09/22/2013   Procedure: LOOP RECORDER IMPLANT;  Surgeon: Coralyn Mark, MD;  Location: [place] CATH LAB;  Service: Cardiovascular;  Laterality: N/A;   LUNG SURGERY  12/07/2001   BENIGN LESION ON RIGHT LUNG REMOVED   HPI:  85 year old nursing home patient with unstageable sacral wounds who presented with a new diagnosis of C. difficile colitis he has extensive cardiac history including coronary artery disease mitral regurgitation nonsustained ventricular tachycardia.  On admission he was hypotensive and treated with fluids.  He developed large bilateral pleural effusions almost overnight on 11/14 along with increasing  O2 needs and placed on bipap. Also concern for aspiration events and RN noticing coughing with meals.  Pt has a history of Schiatzkis ring dilatated in 09/2021.  Pt suffered a stroke in 2014 and was on thickened liquids and purees at that time. Chin tuck and slow rate recommended,  which pt struggled to follow.    Assessment / Plan / Recommendation  Clinical Impression  Brief assessment due to pt resistant to most PO and to upright positioning. Pt with multiple swallows and belching with sips of water and puree. RN and NT have noticed coughing with meals and pills. Dentures not in place and pt refuses placement. Pt has a history of Schiatzki's ring with dilatation just last month. Suspect pt having ongoing difficulty with esopahgeal transit of PO given report. Pt may need purees diet but he is likely to have poor nutrition and risk of aspiration. At this time there is ongoing discussion regarding goals of care. Pt is clearly uncomfortable. Will defer any diet modification until sisters, who were at bedside during eval, have discussed further. SLP Visit Diagnosis: Dysphagia, unspecified (R13.10)    Aspiration Risk  Moderate aspiration risk;Risk for inadequate nutrition/hydration    Diet Recommendation Other (Comment) (comfort feeding)   Liquid Administration via: Cup;Straw Medication Administration: Crushed with puree Supervision: Full supervision/cueing for compensatory strategies Compensations: Follow solids with liquid Postural Changes: Seated upright at 90 degrees    Other  Recommendations Oral Care Recommendations: Oral care BID    Recommendations for follow up therapy are one component of a multi-disciplinary discharge planning process, led by the attending physician.  Recommendations may be updated based on patient status, additional functional criteria and insurance authorization.  Follow up Recommendations Other (comment)      Assistance Recommended at Discharge    Functional Status Assessment    Frequency and Duration min 1 x/week  2 weeks       Prognosis        Swallow Study   General HPI: 85 year old nursing home patient with unstageable sacral wounds who presented with a new diagnosis of C. difficile colitis he has extensive cardiac history  including coronary artery disease mitral regurgitation nonsustained ventricular tachycardia.  On admission he was hypotensive and treated with fluids.  He developed large bilateral pleural effusions almost overnight on 11/14 along with increasing O2 needs and placed on bipap. Also concern for aspiration events and RN noticing coughing with meals.  Pt has a history of Schiatzkis ring dilatated in 09/2021.  Pt suffered a stroke in 2014 and was on thickened liquids and purees at that time. Chin tuck and slow rate recommended, which pt struggled to follow. Type of Study: Bedside Swallow Evaluation Previous Swallow Assessment: see HPI Diet Prior to this Study: Regular;Thin liquids Temperature Spikes Noted: No Respiratory Status: Nasal cannula History of Recent Intubation: No Behavior/Cognition: Alert;Uncooperative Oral Cavity Assessment: Dry Oral Care Completed by SLP: No Oral Cavity - Dentition: Edentulous;Other (Comment) (denstures at bedside) Self-Feeding Abilities: Total assist Patient Positioning: Upright in bed Baseline Vocal Quality: Breathy Volitional Cough: Cognitively unable to elicit Volitional Swallow: Unable to elicit    Oral/Motor/Sensory Function Overall Oral Motor/Sensory Function: Within functional limits   Ice Chips     Thin Liquid Thin Liquid: Impaired Presentation: Straw Pharyngeal  Phase Impairments: Multiple swallows (belching)    Nectar Thick Nectar Thick Liquid: Not tested   Honey Thick Honey Thick Liquid: Not tested   Puree Puree: Impaired Pharyngeal Phase Impairments: Multiple swallows;Other (comments) (beliching)   Solid  Solid: Not tested      Lynann Beaver 10/23/2021,2:43 PM

## 2021-10-24 DIAGNOSIS — Z66 Do not resuscitate: Secondary | ICD-10-CM

## 2021-10-24 DIAGNOSIS — E876 Hypokalemia: Secondary | ICD-10-CM | POA: Diagnosis not present

## 2021-10-24 DIAGNOSIS — I959 Hypotension, unspecified: Secondary | ICD-10-CM | POA: Diagnosis not present

## 2021-10-24 DIAGNOSIS — A0472 Enterocolitis due to Clostridium difficile, not specified as recurrent: Secondary | ICD-10-CM | POA: Diagnosis not present

## 2021-10-24 DIAGNOSIS — R0602 Shortness of breath: Secondary | ICD-10-CM

## 2021-10-24 DIAGNOSIS — J9601 Acute respiratory failure with hypoxia: Secondary | ICD-10-CM | POA: Diagnosis not present

## 2021-10-24 DIAGNOSIS — J90 Pleural effusion, not elsewhere classified: Secondary | ICD-10-CM | POA: Diagnosis not present

## 2021-10-24 DIAGNOSIS — Z8679 Personal history of other diseases of the circulatory system: Secondary | ICD-10-CM | POA: Diagnosis not present

## 2021-10-24 DIAGNOSIS — Z789 Other specified health status: Secondary | ICD-10-CM

## 2021-11-06 NOTE — Discharge Summary (Signed)
Physician Discharge Summary  ANDRAY ASSEFA WNI:[id number] DOB: Jan 20, 1936 DOA: 10/27/2021  PCP: [name], MD  Admit date: 10/21/2021 Discharge date: 2021-11-04  Admitted From: SNF  Disposition: residential hospice   Recommendations for Outpatient Follow-up and new medication changes:  Follow up hospice team.   I spoke over the phone with the patient's sisters about patient's  condition, plan of care, prognosis and all questions were addressed.   Home Health: na   Equipment/Devices: na    Discharge Condition: stable  CODE STATUS: DNR   Diet recommendation:  soft as tolerated.   Brief/Interim Summary: Mr. Cooprider was admitted to the hospital with the working diagnosis of acute C diff colitis, complicated with hypovolemic shock.    85 year old male past medical history for atrial fibrillation, heart failure, COPD, hypertension, dyslipidemia, peripheral vascular disease and prostate cancer who presented with diarrhea.  He had a recent hospitalization 10/12-10/19/2022 for right lower extremity cellulitis.  His hospitalization was complicated by atrial fibrillation with rapid ventricular response and worsening anemia.  He was diagnosed with erosive gastritis.  He reported 2 weeks of diarrhea, associated with abdominal pain.  Because of his rapid worsening condition EMS was called and patient was brought to the hospital.  On his initial physical examination his blood pressure was 97/47, heart rate 83, respiratory rate 24, temperature 97.1, oxygen saturation 99%.  Patient was lethargic, oriented only times person and place, his lungs had rales bilaterally, heart S1-S2, irregularly irregular, abdomen tender to palpation, no lower extremity edema.  Positive sacral ulcer with surrounding erythema.   Sodium 140, potassium 2.4, chloride 107, bicarb 20, glucose 87, BUN 47, creatinine 2.17, AST 10, ALT 9, white cell count 19.1, hemoglobin 10.5, hematocrit 33.0, platelets 246. SARS COVID-19  negative.   C. difficile was positive antigen, positive.    Chest radiograph with bibasilar atelectasis.   EKG 87 bpm, left axis deviation, QTC 501, right bundle branch block, atrial fibrillation rhythm, positive PVCs, no significant ST segment or T wave changes.  Low voltage.   Patient received aggressive fluid resuscitation, and antibiotic therapy with oral vancomycin.   Patient developed acute hypoxic respiratory failure, follow-up chest radiograph showed worsening pleural effusions more right than left. He required non invasive mechanical ventilation and received diuresis, then transitioned to nasal cannula.   Eventually now on room air.     Patient continue to be very weak and deconditioned, poor oral intake, positive confusion.  Considering his poor prognosis, the family decided to transition to comfort care. Patient is being transfer to residential hospice.   C diff colitis and diarrhea, complicated with hypovolemic shock.  Patient was admitted to the progressive care unit, received aggressive volume resuscitation with intravenous fluids. Antibiotic therapy with oral vancomycin.  He regained hemodynamic stability.  He has remained very weak and deconditioned, poor oral intake, confusion, requiring mittens bilaterally. He has advanced age, multiple medical problems, poor prognosis, decision was made to transfer patient residential hospice to continue palliative care.  2.  Acute hypoxemic respiratory failure due to volume overload, pulmonary edema, acute on chronic diastolic heart failure compensation, bilateral pleural effusions. Pulmonary hypertension, (ruled out acute systolic heart failure)/right bundle branch block. Patient required noninvasive mechanical ventilation and diuresis. His volume status and oxygenation improved. It was considered thoracentesis, but considering his response with diuresis decision was made to hold on procedure.  Currently patient is oxygen well, no  signs of severe dyspnea. Further work-up with echocardiography showed preserved LV systolic function.  Ejection fraction 55%, reduced  RV systolic function, moderately enlarged RV cavity, increased pulm artery systolic pressure 16.1 mmHg.  Severe dilatation of the right and left atrium.  He has recovered systolic heart failure now is more diastolic.   3.  Acute kidney injury on chronic kidney disease stage IIIa, hypokalemia. Patient received supportive medical therapy, including intravenous fluids and posteriorly diuresis. At discharge his creatinine was 1.41, potassium 4.1 and bicarb 19. Very poor oral intake.  4.  Severe protein calorie malnutrition, stage II pressure ulcer to sacrum, present on admission, unable to stage II pressure injury to coccyx, not present on admission. Continue nutritional supplements as tolerated, skin care per protocol.  5.  GERD.  Patient received pantoprazole.  6.  Dyslipidemia no further treatment  Discharge Diagnoses:  Principal Problem:   C. difficile colitis Active Problems:   Tobacco abuse   Pressure injury of skin   Protein-calorie malnutrition, severe   Hypotension   Leukocytosis   Hypokalemia   History of CHF (congestive heart failure)   Bilateral pleural effusion    Discharge Instructions   Allergies as of 2021/11/03   No Active Allergies      Medication List     STOP taking these medications    albuterol 108 (90 Base) MCG/ACT inhaler Commonly known as: VENTOLIN HFA   alendronate 70 MG tablet Commonly known as: FOSAMAX   amLODipine 2.5 MG tablet Commonly known as: NORVASC   apixaban 2.5 MG Tabs tablet Commonly known as: Eliquis   atorvastatin 40 MG tablet Commonly known as: LIPITOR   bisacodyl 5 MG EC tablet Commonly known as: DULCOLAX   Cholecalciferol 25 MCG (1000 UT) tablet   docusate sodium 100 MG capsule Commonly known as: COLACE   Entresto 49-51 MG Generic drug: sacubitril-valsartan   feeding  supplement Liqd   Fluticasone-Umeclidin-Vilant 100-62.5-25 MCG/INH Aepb   folic acid 1 MG tablet Commonly known as: FOLVITE   guaiFENesin 600 MG 12 hr tablet Commonly known as: MUCINEX   nicotine 14 mg/24hr patch Commonly known as: NICODERM CQ - dosed in mg/24 hours   nutrition supplement (JUVEN) Pack   nystatin cream Commonly known as: MYCOSTATIN   omega-3 acid ethyl esters 1 g capsule Commonly known as: LOVAZA   pantoprazole 40 MG tablet Commonly known as: PROTONIX   PRESERVISION AREDS PO   saccharomyces boulardii 250 MG capsule Commonly known as: FLORASTOR   thiamine 100 MG tablet   Travoprost (BAK Free) 0.004 % Soln ophthalmic solution Commonly known as: TRAVATAN   triamcinolone ointment 0.1 % Commonly known as: KENALOG   ZINC OXIDE EX        No Active Allergies  Consultations: Pulmonary  Palliative care    Procedures/Studies: CT ABDOMEN PELVIS WO CONTRAST  Result Date: 10/08/2021 CLINICAL DATA:  Diverticulitis suspected a sepsis EXAM: CT ABDOMEN AND PELVIS WITHOUT CONTRAST TECHNIQUE: Multidetector CT imaging of the abdomen and pelvis was performed following the standard protocol without IV contrast. COMPARISON:  CT abdomen and pelvis 07/22/2021 FINDINGS: Lower chest: Moderate size bilateral pleural effusions with adjacent basilar atelectasis. Coronary artery calcifications. Aortic valve calcifications. Hepatobiliary: No focal liver abnormality is seen. The gallbladder is likely decompressed. Pancreas: Unremarkable. No pancreatic ductal dilatation or surrounding inflammatory changes. Spleen: Normal in size without focal abnormality. Adrenals/Urinary Tract: Adrenal glands are unremarkable. The kidneys appear mildly atrophic. No hydronephrosis or nephrolithiasis. Unchanged bilateral bladder diverticula. Stomach/Bowel: The stomach is within normal limits. There is no evidence of bowel obstruction. The appendix is normal. There are multiple fluid-filled loops of  small bowel  throughout the abdomen without a transition point. This continues as a fluid-filled cecum and ascending colon. There is long segment inflammatory stranding along the sigmoid colon and rectum, with submucosal edema and a few diverticula present. There is also mild inflammatory stranding along the of the ascending colon and splenic flexure to a lesser degree than the rectosigmoid colon. Vascular/Lymphatic: Aortoiliac atherosclerotic calcifications. Infrarenal abdominal aortic aneurysm measuring 3.1 cm, previously 3.1 cm (series 3, image 44). This was remeasured for consistency. Reproductive: No clear abnormality on CT, though the patient reportedly has a history of prostate cancer. Other: Since the prior exam, the patient appears to have lost a significant amount of body fat. No hernia. Musculoskeletal: Prior bilateral hip fixation. There is avascular necrosis of the right femoral head with articular surface collapse, unchanged from prior exam. Mild bilateral hip osteoarthritis. Multilevel degenerative changes of the spine. No suspicious lytic or blastic lesions. There is a sacral decubitus ulcer, with underlying sclerosis at the sacrococcygeal junction, likely reflecting osteomyelitis. This bony changes new since the prior exam. Generalized muscle atrophy. IMPRESSION: Long segment inflammatory stranding of the rectosigmoid colon and to a lesser degree at the splenic flexure and ascending colon. There are few diverticula present in the sigmoid colon. Findings are favored to represent colitis rather than diverticulitis. No focal/drainable fluid collection. Recommend testing for C. difficile. Fluid-filled loops of small bowel, likely reflecting small-bowel enteritis as well. No evidence of bowel obstruction. Moderate-sized bilateral pleural effusions with adjacent basilar atelectasis. Sacral decubitus ulcer with underlying new sclerosis at the sacrococcygeal junction, compatible with osteomyelitis. Unchanged  avascular necrosis of the right femoral head with articular surface collapse. Since the prior abdominopelvic CT in August, patient appears to have lost a significant amount of body fat. Electronically Signed   By: Maurine Simmering M.D.   On: 10/12/2021 13:22   CT CHEST WO CONTRAST  Result Date: 10/21/2021 CLINICAL DATA:  Respiratory failure.  Sepsis. EXAM: CT CHEST WITHOUT CONTRAST TECHNIQUE: Multidetector CT imaging of the chest was performed following the standard protocol without IV contrast. COMPARISON:  Chest x-ray 10/21/2021. CT chest 08/23/2018. Fall to the mid this special FINDINGS: Cardiovascular: The aorta is diffusely ectatic. The descending thoracic aorta is diffusely mildly dilated measuring up to 3.2 cm. The heart is moderately enlarged. There is no pericardial effusion. There are atherosclerotic calcifications of the coronary arteries and aorta. Mediastinum/Nodes: There are prominent, but nonenlarged mediastinal lymph nodes diffusely. The visualized thyroid gland is within normal limits. Esophagus is unremarkable. Lungs/Pleura: There are small/moderate sized bilateral pleural effusions. Bilateral pleural calcifications are again seen compatible with prior asbestos exposure. There is no pneumothorax visualized. Two there is consolidation and atelectasis of the entire left lower lobe. Air bronchograms are present. There is consolidation in the inferior left upper lobe. Additionally there is focal consolidation in the left upper lobe with slightly masslike appearance at the apex measuring 4.6 x 5.6 cm. There is cutoff of the left mainstem bronchus. Right-sided bronchi appear patent. Trachea appears patent. There is minimal compressive atelectasis in the right lower lobe and linear atelectasis or scarring in the right middle lobe. Upper Abdomen: No acute abnormality. Musculoskeletal: No chest wall mass or suspicious bone lesions identified. Mild body wall edema. IMPRESSION: 1. Cutoff of the left mainstem  bronchus which may be secondary to mass or secretions. 2. Complete consolidation and atelectasis involving the left lower lobe and inferior left upper lobe. Findings are worrisome for postobstructive pneumonia. 3. Additional focal airspace disease in the left lung apex appears  slightly masslike and may represent pneumonia or neoplasm. 4. Small to moderate-sized bilateral pleural effusions. 5. Cardiomegaly. 6. Diffusely ectatic aorta.  Aortic Atherosclerosis (ICD10-I70.0). 7.   Body wall edema. Electronically Signed   By: Ronney Asters M.D.   On: 10/21/2021 18:06   DG CHEST PORT 1 VIEW  Result Date: 10/21/2021 CLINICAL DATA:  85 year old male respiratory failure.  Hypoxia. EXAM: PORTABLE CHEST 1 VIEW COMPARISON:  11/05/2021 and earlier. CT Abdomen and Pelvis 10/21/2021. FINDINGS: Portable AP semi upright view at 0404 hours. Mildly rotated to the left. Similar lung volumes but increased veiling opacity in the mid and lower left lung since the prior exam. Moderate veiling opacity in the right lower lung not significantly changed. Stable cardiac size and mediastinal contours. Visualized tracheal air column is within normal limits. No pneumothorax. Right upper lobe pulmonary vascularity appears normal, arguing against acute edema. No definite air bronchograms. No acute osseous abnormality identified. IMPRESSION: Increased veiling and confluent opacity in the lungs left > right, suspicious for progressive pleural effusion and atelectasis or consolidation. Pneumonia not excluded. No strong evidence of pulmonary edema. Electronically Signed   By: Genevie Ann M.D.   On: 10/21/2021 04:39   DG CHEST PORT 1 VIEW  Result Date: 10/22/2021 CLINICAL DATA:  Shortness of breath. EXAM: PORTABLE CHEST 1 VIEW COMPARISON:  Chest x-ray 10/19/2021. CT abdomen and pelvis 10/17/2016. FINDINGS: There are small bilateral pleural effusions. There is band of opacity in the right mid lung. The heart is upper limits of normal in size. There  are atherosclerotic calcifications of the aorta. There is no evidence for pneumothorax or acute fracture. IMPRESSION: Small bilateral pleural effusions. Right mid lung atelectasis/airspace disease. Electronically Signed   By: Ronney Asters M.D.   On: 10/19/2021 17:11   DG Chest Port 1 View  Result Date: 11/05/2021 CLINICAL DATA:  Sepsis. EXAM: PORTABLE CHEST 1 VIEW COMPARISON:  September 17, 2021. FINDINGS: Stable cardiomediastinal silhouette. Stable probable right basilar atelectasis with small right pleural effusion. Minimal left basilar subsegmental atelectasis may be present. Bony thorax is unremarkable. IMPRESSION: Stable right basilar atelectasis with small right pleural effusion. Minimal left basilar subsegmental atelectasis is noted. Electronically Signed   By: Marijo Conception M.D.   On: 10/31/2021 08:54   ECHOCARDIOGRAM COMPLETE  Result Date: 10/22/2021    ECHOCARDIOGRAM REPORT   Patient Name:   TORRI LANGSTON Date of Exam: 10/22/2021 Medical Rec #:  [id number]       Height:       72.0 in Accession #:    [id number]      Weight:       166.7 lb Date of Birth:  09/19/1936       BSA:          1.972 m Patient Age:    19 years        BP:           109/63 mmHg Patient Gender: M               HR:           51 bpm. Exam Location:  Inpatient Procedure: 2D Echo, Cardiac Doppler and Color Doppler Indications:    Dyspnea R06.00  History:        Patient has prior history of Echocardiogram examinations.  Sonographer:    Merrie Roof RDCS Referring Phys: [id number] [place]  1. Left ventricular ejection fraction, by estimation, is 55%. The left ventricle has normal function. The left ventricle has  no regional wall motion abnormalities. Left ventricular diastolic parameters are indeterminate.  2. Mildly D-shaped interventricular septum suggestive of RV pressure/volume overload. Right ventricular systolic function is moderately reduced. The right ventricular size is moderately enlarged. There is  moderately elevated pulmonary artery systolic pressure. The estimated right ventricular systolic pressure is 32.2 mmHg.  3. The mitral valve is abnormal with mild bileaflet prolapse. Mild mitral valve regurgitation. No evidence of mitral stenosis.  4. The aortic valve is tricuspid. Aortic valve regurgitation is not visualized. Aortic valve sclerosis/calcification is present, without any evidence of aortic stenosis.  5. Left atrial size was severely dilated.  6. Right atrial size was severely dilated.  7. The inferior vena cava is dilated in size with >50% respiratory variability, suggesting right atrial pressure of 8 mmHg.  8. Left pleural effusion. FINDINGS  Left Ventricle: Left ventricular ejection fraction, by estimation, is 55%. The left ventricle has normal function. The left ventricle has no regional wall motion abnormalities. The left ventricular internal cavity size was normal in size. There is no left ventricular hypertrophy. Left ventricular diastolic parameters are indeterminate. Right Ventricle: Mildly D-shaped interventricular septum suggestive of RV pressure/volume overload. The right ventricular size is moderately enlarged. No increase in right ventricular wall thickness. Right ventricular systolic function is moderately reduced. There is moderately elevated pulmonary artery systolic pressure. The tricuspid regurgitant velocity is 3.05 m/s, and with an assumed right atrial pressure of 8 mmHg, the estimated right ventricular systolic pressure is 02.5 mmHg. Left Atrium: Left atrial size was severely dilated. Right Atrium: Right atrial size was severely dilated. Pericardium: Left pleural effusion. Trivial pericardial effusion is present. Mitral Valve: The mitral valve is abnormal. Mild mitral valve regurgitation. No evidence of mitral valve stenosis. Tricuspid Valve: The tricuspid valve is normal in structure. Tricuspid valve regurgitation is trivial. Aortic Valve: The aortic valve is tricuspid. Aortic  valve regurgitation is not visualized. Aortic valve sclerosis/calcification is present, without any evidence of aortic stenosis. Aortic valve mean gradient measures 2.0 mmHg. Aortic valve peak gradient measures 5.0 mmHg. Aortic valve area, by VTI measures 1.87 cm. Pulmonic Valve: The pulmonic valve was normal in structure. Pulmonic valve regurgitation is not visualized. Aorta: The aortic root is normal in size and structure. Venous: The inferior vena cava is dilated in size with greater than 50% respiratory variability, suggesting right atrial pressure of 8 mmHg. IAS/Shunts: No atrial level shunt detected by color flow Doppler.  LEFT VENTRICLE PLAX 2D LVIDd:         4.40 cm   Diastology LVIDs:         2.80 cm   LV e' lateral:   12.50 cm/s LV PW:         1.10 cm   LV E/e' lateral: 8.5 LV IVS:        1.00 cm LVOT diam:     2.20 cm LV SV:         47 LV SV Index:   24 LVOT Area:     3.80 cm  RIGHT VENTRICLE          IVC RV Basal diam:  4.50 cm  IVC diam: 2.25 cm RV Mid diam:    4.40 cm TAPSE (M-mode): 1.8 cm LEFT ATRIUM            Index        RIGHT ATRIUM           Index LA diam:      3.60 cm  1.83 cm/m  RA Area:     30.50 cm LA Vol (A4C): 127.6 ml 64.72 ml/m  RA Volume:   103.00 ml 52.24 ml/m  AORTIC VALVE AV Area (Vmax):    2.42 cm AV Area (Vmean):   2.25 cm AV Area (VTI):     1.87 cm AV Vmax:           112.00 cm/s AV Vmean:          71.300 cm/s AV VTI:            0.252 m AV Peak Grad:      5.0 mmHg AV Mean Grad:      2.0 mmHg LVOT Vmax:         71.30 cm/s LVOT Vmean:        42.200 cm/s LVOT VTI:          0.124 m LVOT/AV VTI ratio: 0.49  AORTA Ao Root diam: 3.50 cm Ao Asc diam:  3.60 cm MITRAL VALVE                TRICUSPID VALVE MV Area (PHT): 4.41 cm     TR Peak grad:   37.2 mmHg MV Decel Time: 172 msec     TR Vmax:        305.00 cm/s MV E velocity: 106.00 cm/s                             SHUNTS                             Systemic VTI:  0.12 m                             Systemic Diam: 2.20 cm Dalton  McleanMD Electronically signed by Franki Monte Signature Date/Time: 10/22/2021/5:49:25 PM    Final       Subjective: Patient is somnolent, but easy to arouse, answers simple questions.   Discharge Exam: Vitals:   10/23/21 1948 Oct 30, 2021 0718  BP: 128/70 131/76  Pulse: 70 71  Resp: 19 20  Temp: 97.6 F (36.4 C) 97.7 F (36.5 C)  SpO2: 93% 94%   Vitals:   10/23/21 0835 10/23/21 1130 10/23/21 1948 10/30/21 0718  BP: (!) 130/55 123/75 128/70 131/76  Pulse: 72 71 70 71  Resp: 20 18 19 20   Temp: 97.6 F (36.4 C) (!) 97.5 F (36.4 C) 97.6 F (36.4 C) 97.7 F (36.5 C)  TempSrc: Oral Oral Oral Oral  SpO2: 93% 92% 93% 94%  Weight:        General: Not in pain or dyspnea  Neurology: somnolent but easy to arouse  E ENT: positive pallor, no icterus, oral mucosa moist Cardiovascular: No JVD. S1-S2 present, rhythmic, no gallops, rubs, or murmurs. Trace lower extremity edema. Pulmonary: positive breath sounds bilaterally, decreased breath sounds at bases and dependent zones, no wheezing, proximal airway rhonchi Gastrointestinal. Abdomen soft and non tender Skin. No rashes Musculoskeletal: no joint deformities   The results of significant diagnostics from this hospitalization (including imaging, microbiology, ancillary and laboratory) are listed below for reference.     Microbiology: Recent Results (from the past 240 hour(s))  Blood Culture (routine x 2)     Status: None   Collection Time: 10/21/2021  8:45 AM   Specimen: BLOOD  Result Value Ref Range Status   Specimen Description BLOOD RIGHT  ANTECUBITAL  Final   Special Requests   Final    BOTTLES DRAWN AEROBIC AND ANAEROBIC Blood Culture results may not be optimal due to an inadequate volume of blood received in culture bottles   Culture   Final    NO GROWTH 5 DAYS Performed at [facility], [place] [address] [place], [place] [address]    Report Status 10/22/2021 FINAL  Final  Resp Panel by RT-PCR (Flu A&B, Covid)  Nasopharyngeal Swab     Status: None   Collection Time: 10/12/2021  8:45 AM   Specimen: Nasopharyngeal Swab; Nasopharyngeal(NP) swabs in vial transport medium  Result Value Ref Range Status   SARS Coronavirus 2 by RT PCR NEGATIVE NEGATIVE Final    Comment: (NOTE) SARS-CoV-2 target nucleic acids are NOT DETECTED.  The SARS-CoV-2 RNA is generally detectable in upper respiratory specimens during the acute phase of infection. The lowest concentration of SARS-CoV-2 viral copies this assay can detect is 138 copies/mL. A negative result does not preclude SARS-Cov-2 infection and should not be used as the sole basis for treatment or other patient management decisions. A negative result may occur with  improper specimen collection/handling, submission of specimen other than nasopharyngeal swab, presence of viral mutation(s) within the areas targeted by this assay, and inadequate number of viral copies(<138 copies/mL). A negative result must be combined with clinical observations, patient history, and epidemiological information. The expected result is Negative.  Fact Sheet for Patients:  EntrepreneurPulse.com.au  Fact Sheet for Healthcare Providers:  IncredibleEmployment.be  This test is no t yet approved or cleared by the [place] FDA and  has been authorized for detection and/or diagnosis of SARS-CoV-2 by FDA under an Emergency Use Authorization (EUA). This EUA will remain  in effect (meaning this test can be used) for the duration of the COVID-19 declaration under Section 564(b)(1) of the Act, 21 U.S.C.section 360bbb-3(b)(1), unless the authorization is terminated  or revoked sooner.       Influenza A by PCR NEGATIVE NEGATIVE Final   Influenza B by PCR NEGATIVE NEGATIVE Final    Comment: (NOTE) The Xpert Xpress SARS-CoV-2/FLU/RSV plus assay is intended as an aid in the diagnosis of influenza from Nasopharyngeal swab specimens and should not be  used as a sole basis for treatment. Nasal washings and aspirates are unacceptable for Xpert Xpress SARS-CoV-2/FLU/RSV testing.  Fact Sheet for Patients: EntrepreneurPulse.com.au  Fact Sheet for Healthcare Providers: IncredibleEmployment.be  This test is not yet approved or cleared by the [place] FDA and has been authorized for detection and/or diagnosis of SARS-CoV-2 by FDA under an Emergency Use Authorization (EUA). This EUA will remain in effect (meaning this test can be used) for the duration of the COVID-19 declaration under Section 564(b)(1) of the Act, 21 U.S.C. section 360bbb-3(b)(1), unless the authorization is terminated or revoked.  Performed at [facility], [place] [address], [place], [place] [address]   C Difficile Quick Screen w PCR reflex     Status: Abnormal   Collection Time: 10/30/2021  8:45 AM   Specimen: STOOL  Result Value Ref Range Status   C Diff antigen POSITIVE (A) NEGATIVE Final   C Diff toxin NEGATIVE NEGATIVE Final   C Diff interpretation Results are indeterminate. See PCR results.  Final    Comment: Performed at [facility], [place] [address] [place], [place] [address]  C. Diff by PCR, Reflexed     Status: Abnormal   Collection Time: 10/31/2021  8:45 AM  Result Value  Ref Range Status   Toxigenic C. Difficile by PCR POSITIVE (A) NEGATIVE Final    Comment: Positive for toxigenic C. difficile with little to no toxin production. Only treat if clinical presentation suggests symptomatic illness. Performed at [facility], [place] [address] [place], [place] [address]   Blood Culture (routine x 2)     Status: None   Collection Time: 10/08/2021  9:14 AM   Specimen: BLOOD  Result Value Ref Range Status   Specimen Description BLOOD LEFT ANTECUBITAL  Final   Special Requests   Final    BOTTLES DRAWN AEROBIC ONLY Blood Culture adequate volume   Culture   Final    NO GROWTH 5 DAYS Performed at [facility], [place] [address], [place], [place] [address]    Report Status 10/22/2021 FINAL  Final     Labs: BNP (last 3 results) Recent Labs    09/17/21 1149  BNP 157.2*   Basic Metabolic Panel: Recent Labs  Lab 10/26/2021 1653 10/18/21 0223 10/18/21 0919 10/19/21 0240 10/20/21 0225 10/21/21 0922 10/23/21 0059  NA  --    < > 140 136 138 136 138  K  --    < > 3.4* 3.5 3.3* 4.1 4.1  CL  --    < > 114* 111 114* 111 110  CO2  --    < > 15* 18* 19* 20* 19*  GLUCOSE  --    < > 77 119* 103* 110* 108*  BUN  --    < > 41* 36* 33* 37* 44*  CREATININE  --    < > 2.07* 1.74* 1.64* 1.45* 1.41*  CALCIUM  --    < > 7.4* 7.4* 7.7* 7.9* 8.4*  MG 1.1*  --  1.5* 2.1  --   --   --   PHOS  --   --   --  2.7  --   --   --    < > = values in this interval not displayed.   Liver Function Tests: Recent Labs  Lab 10/20/21 0225  AST 14*  ALT 9  ALKPHOS 61  BILITOT 0.7  PROT 5.0*  ALBUMIN 2.2*   No results for input(s): LIPASE, AMYLASE in the last 168 hours. No results for input(s): AMMONIA in the last 168 hours. CBC: Recent Labs  Lab 10/18/21 0223 10/19/21 0240 10/20/21 0225 10/21/21 0922 10/23/21 0059  WBC 12.8* 10.9* 10.0 13.5* 11.5*  NEUTROABS  --  7.4 6.7 11.2*  --   HGB 7.4* 8.1* 8.9* 9.7* 7.8*  HCT 23.4* 25.6* 27.5* 31.4* 24.4*  MCV 94.0 94.5 92.0 94.6 92.8  PLT 196 205 242 278 198   Cardiac Enzymes: No results for input(s): CKTOTAL, CKMB, CKMBINDEX, TROPONINI in the last 168 hours. BNP: Invalid input(s): POCBNP CBG: Recent Labs  Lab 10/19/21 1317 10/19/21 1721 10/20/21 0820 10/20/21 1208 10/20/21 1622  GLUCAP 113* 117* 101* 111* 126*   D-Dimer No results for input(s): DDIMER in the last 72 hours. Hgb A1c No results for input(s): HGBA1C in the last 72 hours. Lipid Profile No results for input(s): CHOL, HDL, LDLCALC, TRIG, CHOLHDL, LDLDIRECT in the last 72 hours. Thyroid function studies No results for input(s): TSH, T4TOTAL, T3FREE, THYROIDAB in the last 72  hours.  Invalid input(s): FREET3 Anemia work up No results for input(s): VITAMINB12, FOLATE, FERRITIN, TIBC, IRON, RETICCTPCT in the last 72 hours. Urinalysis    Component Value Date/Time   COLORURINE YELLOW 05/06/2015 0038   APPEARANCEUR  CLEAR 05/06/2015 0038   LABSPEC 1.012 05/06/2015 0038   PHURINE 5.5 05/06/2015 0038   GLUCOSEU NEGATIVE 05/06/2015 0038   HGBUR NEGATIVE 05/06/2015 0038   BILIRUBINUR NEGATIVE 05/06/2015 0038   KETONESUR NEGATIVE 05/06/2015 0038   PROTEINUR 100 (A) 05/06/2015 0038   UROBILINOGEN 0.2 05/06/2015 0038   NITRITE NEGATIVE 05/06/2015 0038   LEUKOCYTESUR NEGATIVE 05/06/2015 0038   Sepsis Labs Invalid input(s): PROCALCITONIN,  WBC,  LACTICIDVEN Microbiology Recent Results (from the past 240 hour(s))  Blood Culture (routine x 2)     Status: None   Collection Time: 10/20/2021  8:45 AM   Specimen: BLOOD  Result Value Ref Range Status   Specimen Description BLOOD RIGHT ANTECUBITAL  Final   Special Requests   Final    BOTTLES DRAWN AEROBIC AND ANAEROBIC Blood Culture results may not be optimal due to an inadequate volume of blood received in culture bottles   Culture   Final    NO GROWTH 5 DAYS Performed at [facility], [place] [address], [place], [place] [address]    Report Status 10/22/2021 FINAL  Final  Resp Panel by RT-PCR (Flu A&B, Covid) Nasopharyngeal Swab     Status: None   Collection Time: 10/16/2021  8:45 AM   Specimen: Nasopharyngeal Swab; Nasopharyngeal(NP) swabs in vial transport medium  Result Value Ref Range Status   SARS Coronavirus 2 by RT PCR NEGATIVE NEGATIVE Final    Comment: (NOTE) SARS-CoV-2 target nucleic acids are NOT DETECTED.  The SARS-CoV-2 RNA is generally detectable in upper respiratory specimens during the acute phase of infection. The lowest concentration of SARS-CoV-2 viral copies this assay can detect is 138 copies/mL. A negative result does not preclude SARS-Cov-2 infection and should not be used as the sole  basis for treatment or other patient management decisions. A negative result may occur with  improper specimen collection/handling, submission of specimen other than nasopharyngeal swab, presence of viral mutation(s) within the areas targeted by this assay, and inadequate number of viral copies(<138 copies/mL). A negative result must be combined with clinical observations, patient history, and epidemiological information. The expected result is Negative.  Fact Sheet for Patients:  EntrepreneurPulse.com.au  Fact Sheet for Healthcare Providers:  IncredibleEmployment.be  This test is no t yet approved or cleared by the [place] FDA and  has been authorized for detection and/or diagnosis of SARS-CoV-2 by FDA under an Emergency Use Authorization (EUA). This EUA will remain  in effect (meaning this test can be used) for the duration of the COVID-19 declaration under Section 564(b)(1) of the Act, 21 U.S.C.section 360bbb-3(b)(1), unless the authorization is terminated  or revoked sooner.       Influenza A by PCR NEGATIVE NEGATIVE Final   Influenza B by PCR NEGATIVE NEGATIVE Final    Comment: (NOTE) The Xpert Xpress SARS-CoV-2/FLU/RSV plus assay is intended as an aid in the diagnosis of influenza from Nasopharyngeal swab specimens and should not be used as a sole basis for treatment. Nasal washings and aspirates are unacceptable for Xpert Xpress SARS-CoV-2/FLU/RSV testing.  Fact Sheet for Patients: EntrepreneurPulse.com.au  Fact Sheet for Healthcare Providers: IncredibleEmployment.be  This test is not yet approved or cleared by the [place] FDA and has been authorized for detection and/or diagnosis of SARS-CoV-2 by FDA under an Emergency Use Authorization (EUA). This EUA will remain in effect (meaning this test can be used) for the duration of the COVID-19 declaration under Section 564(b)(1) of the Act,  21 U.S.C. section 360bbb-3(b)(1), unless the authorization is terminated  or revoked.  Performed at [facility], [place] [address] [place], [place] [address]   C Difficile Quick Screen w PCR reflex     Status: Abnormal   Collection Time: 11/04/2021  8:45 AM   Specimen: STOOL  Result Value Ref Range Status   C Diff antigen POSITIVE (A) NEGATIVE Final   C Diff toxin NEGATIVE NEGATIVE Final   C Diff interpretation Results are indeterminate. See PCR results.  Final    Comment: Performed at [facility], [place] [address], [place], [place] [address]  C. Diff by PCR, Reflexed     Status: Abnormal   Collection Time: 10/12/2021  8:45 AM  Result Value Ref Range Status   Toxigenic C. Difficile by PCR POSITIVE (A) NEGATIVE Final    Comment: Positive for toxigenic C. difficile with little to no toxin production. Only treat if clinical presentation suggests symptomatic illness. Performed at [facility], [place] [address] [place], [place] [address]   Blood Culture (routine x 2)     Status: None   Collection Time: 10/31/2021  9:14 AM   Specimen: BLOOD  Result Value Ref Range Status   Specimen Description BLOOD LEFT ANTECUBITAL  Final   Special Requests   Final    BOTTLES DRAWN AEROBIC ONLY Blood Culture adequate volume   Culture   Final    NO GROWTH 5 DAYS Performed at [facility], [place] [address], [place], [place] [address]    Report Status 10/22/2021 FINAL  Final     Time coordinating discharge: 45 minutes  SIGNED:   Tawni Millers, MD  [facility] 2021-11-04, 11:35 AM

## 2021-11-06 NOTE — Progress Notes (Signed)
Report given to hospice facility. Will continue to monitor . Waiting for PTAR   Phoebe Sharps, RN

## 2021-11-06 NOTE — Care Management Important Message (Signed)
Important Message  Patient Details  Name: Matthew Mahoney MRN: [id number] Date of Birth: 1936-03-09   Medicare Important Message Given:  Yes     Shelda Altes Oct 27, 2021, 11:06 AM

## 2021-11-06 NOTE — Progress Notes (Signed)
   2021-11-07 1332  Attending [place]  Attending Physician Notified Y  Attending Physician (First and Last Name) Gerome Apley  Post Mortem Checklist  Date of Death 11/07/2021  Time of Death 57  Pronounced By Matthew Mahoney  Next of kin notified Yes  Name of next of kin notified of death Glynis Smiles Person's Relationship to Patient Ralene Bathe Person's Phone Number [phone]/ [phone]  Contact Person's address [address] [place] [address]  Was the patient a No Code Blue or a Limited Code Blue? Yes  Did the patient die unattended? Yes  Patient restrained? Not applicable  Height 5\' 8"  (1.727 m)  Weight 76.9 kg  Body preparation complete Y  [employer] (previously known as [employer])  Notification Date 2021/11/07  Notification Time 1332  [employer] Number [id number]  Is patient a potential donor? N  Autopsy  Autopsy requested by N/A  Patient and [place] Returned  Patient is satisfied that all belongings have been returned? Not applicable  Dermatherapy linen/gowns NOT sent with patient or transporter Not applicable  Notifications  Patient Placement notified that Post Mortem checklist is complete Yes  Patient Placement notified body transferred Transported to [place]  Is this a medical examiner's case? [place] home name/address/phone # [facility] [place] [phone]  Planned location of pickup [place]

## 2021-11-06 NOTE — Progress Notes (Signed)
Pt expired @ 1318. MD notified and attempted to call family member but no answer. Will continue to try informing pt      Phoebe Sharps, RN

## 2021-11-06 NOTE — Progress Notes (Signed)
Pt family notified and on there way.  Albin Felling Dnasia Gauna

## 2021-11-06 NOTE — Progress Notes (Addendum)
Daily Progress Note   Patient Name: Matthew Mahoney       Date: 17-Nov-2021 DOB: 1936/07/25  Age: 85 y.o. MRN#: [id number] Attending Physician: Tawni Millers Primary Care Physician: [name], MD Admit Date: 10/29/2021  Reason for Consultation/Follow-up: Establishing goals of care, failure to thrive  Subjective: Chart review performed. Received report from primary RN - no acute concerns.   Went to visit patient at bedside - no family/visitors present. Patient was lying in bed awake, alert, and able to participate in minimal conversation with simple yes/no and head nods. No signs or non-verbal gestures of pain or discomfort noted. No respiratory distress or increased work of breathing; secretions were noted. He denies pain. RN attempted to give small amount of water with ice on spoon - he did show signs of aspiration with significant weak coughing.   Informed by [facility] patient was accepted to [facility].  Length of Stay: 7  Current Medications: Scheduled Meds:  . collagenase   Topical Daily  . latanoprost  1 drop Both Eyes QHS  . morphine CONCENTRATE  5 mg Oral Q6H  . pantoprazole  40 mg Oral Daily  . saccharomyces boulardii  250 mg Oral BID  . sodium chloride flush  3 mL Intravenous Q12H    Continuous Infusions:   PRN Meds: acetaminophen **OR** acetaminophen, antiseptic oral rinse, glycopyrrolate **OR** glycopyrrolate **OR** glycopyrrolate, haloperidol **OR** haloperidol **OR** haloperidol lactate, ipratropium-albuterol, LORazepam **OR** LORazepam **OR** LORazepam, morphine injection, ondansetron **OR** ondansetron (ZOFRAN) IV, polyvinyl alcohol  Physical Exam Vitals and nursing note reviewed.  Constitutional:      General: He is not in acute  distress.    Appearance: He is ill-appearing.  Pulmonary:     Effort: No respiratory distress.  Skin:    General: Skin is warm and dry.  Neurological:     Mental Status: He is alert.     Motor: Weakness present.  Psychiatric:        Cognition and Memory: Cognition is impaired.            Vital Signs: BP 131/76 (BP Location: Left Arm)   Pulse 71   Temp 97.7 F (36.5 C) (Oral)   Resp 20   Wt 76.9 kg   SpO2 94%   BMI 22.99 kg/m  SpO2: SpO2: 94 %  O2 Device: O2 Device: Room Air O2 Flow Rate: O2 Flow Rate (L/min): 0 L/min  Intake/output summary:  Intake/Output Summary (Last 24 hours) at Oct 25, 2021 1240 Last data filed at 10/23/2021 1954 Gross per 24 hour  Intake --  Output 300 ml  Net -300 ml   LBM: Last BM Date: 10/22/21 Baseline Weight: Weight: 70.9 kg Most recent weight: Weight: 76.9 kg       Palliative Assessment/Data: PPS 10-20%      Patient Active Problem List   Diagnosis Date Noted  . Bilateral pleural effusion 10/21/2021  . C. difficile colitis 10/12/2021  . Hypotension 10/18/2021  . Leukocytosis 10/13/2021  . Hypokalemia 10/15/2021  . History of CHF (congestive heart failure) 10/10/2021  . Pressure injury of skin 09/18/2021  . Protein-calorie malnutrition, severe 09/18/2021  . Ambulatory dysfunction 09/17/2021  . Symptomatic bradycardia 09/17/2021  . Abrasion of lower leg with infection 09/17/2021  . Peripheral arterial disease ([place]) 01/20/2017  . Nonsustained ventricular tachycardia 05/09/2015  . Closed fracture of femur, neck ([place]) 05/06/2015  . Chronic combined systolic and diastolic congestive heart failure ([place]) 05/06/2015  . GERD (gastroesophageal reflux disease) 10/11/2013  . CAD (coronary artery disease) 10/11/2013  . Atrial flutter ([place]) 09/25/2013  . Conduction disorder of the heart 09/25/2013  . Thrombocytopenia ([place]) 09/25/2013  . Atrial fibrillation ([place]) 09/23/2013  . Secondary cardiomyopathy, unspecified 09/23/2013  . CVA  (cerebral infarction) 09/19/2013  . CKD (chronic kidney disease), stage III ([place]) 09/19/2013  . Alcohol abuse 01/21/2013  . Tobacco abuse 01/21/2013  . Hyponatremia 01/21/2013  . Hypertension   . COPD (chronic obstructive pulmonary disease) ([place])   . Choledocholithiasis   . Coronary arteriosclerosis   . Hyperlipidemia   . Ichthyosis   . Mitral regurgitation   . Onychomycosis   . Vitamin D deficiency   . Aortic stenosis   . Prostate cancer ([place])   . Ascites   . History of colonic diverticulitis   . Peripheral vascular disease ([place]) 02/18/2011  . OTHER SYMPTOMS INVOLVING CARDIOVASCULAR SYSTEM 02/18/2011    Palliative Care Assessment & Plan   Patient Profile: 85 y.o. male  with multiple medical issues including chronic combined heart failure, CKD stage 3b, COPD, CAD, and MVR who presented to the emergency department from SNF on 10/07/2021 with diarrhea.  Per nursing home staff, patient had been having loose stools for the last 2 weeks and they called EMS due to concern for possible sepsis. He was found to be In the ED, CT abdomen showed inflammatory stranding of the rectosigmoid colon concerning for colitis. He was found positive for C.difficile. He was initially hypotensive. He has received aggressive fluid resuscitation and antibiotics with oral vancomycin.  subsequently developed large bilateral pleural effusions.  11/14 - developed acute respiratory failure requiring BiPAP 11/15 - CT chest showed complete consolidation and atelectasis involving the left lower lobe and inferior left upper lobe. Findings are worrisome for postobstructive pneumonia. 11/17 - SLP evaluation reveals that patient has dysphagia and moderate risk for aspiration 11/18 - accepted to [facility] [place], plan for transfer today  Assessment: C. Difficile colitis, acute Hypotension Acute hypoxic respiratory failure AKI History of CHF Severe protein calorie malnutrition Terminal  care  Recommendations/Plan: Continue full comfort measures Continue DNR/DNI as previously documented - durable DNR form completed and placed in shadow chart. Copy was made and will be scanned into Vynca/ACP tab Transfer to [place] today   Goals of Care and Additional Recommendations: Limitations on Scope of Treatment:  Full Comfort Care  Code Status:    Code Status Orders  (From admission, onward)           Start     Ordered   10/23/21 1715  Do not attempt resuscitation (DNR)  Continuous       Question Answer Comment  In the event of cardiac or respiratory ARREST Do not call a "code blue"   In the event of cardiac or respiratory ARREST Do not perform Intubation, CPR, defibrillation or ACLS   In the event of cardiac or respiratory ARREST Use medication by any route, position, wound care, and other measures to relive pain and suffering. May use oxygen, suction and manual treatment of airway obstruction as needed for comfort.      10/23/21 1715           Code Status History     Date Active Date Inactive Code Status Order ID Comments User Context   11/03/2021 1603 10/23/2021 1715 DNR [id number]  Norval Morton, MD Inpatient   10/16/2021 1511 11/03/2021 1603 Full Code [id number]  Norval Morton, MD ED   09/17/2021 1650 09/24/2021 1705 Full Code [id number]  Karmen Bongo, MD ED   05/07/2015 2056 05/09/2015 2238 Full Code [id number]  Phylliss Bob, MD Inpatient   05/06/2015 0353 05/07/2015 2056 Full Code [id number]  Lavina Hamman, MD Inpatient   09/19/2013 1748 09/26/2013 2012 Full Code [id number]  Oswald Hillock, MD Inpatient   03/16/2013 1451 03/20/2013 2242 Full Code [id number]  Donita Brooks, NP ED   01/21/2013 0143 01/24/2013 1906 Full Code [id number]  Derrill Kay, MD Inpatient       Prognosis:  < 2 weeks  Discharge Planning: Hospice facility  Care plan was discussed with primary RN, patient  Thank you for allowing the Palliative Medicine Team to  assist in the care of this patient.   Total Time 15 minutes Prolonged Time Billed  no       Greater than 50%  of this time was spent counseling and coordinating care related to the above assessment and plan.  Lin Landsman, NP  Please contact Palliative Medicine Team phone at [phone] for questions and concerns.

## 2021-11-06 NOTE — Progress Notes (Signed)
SLP Cancellation Note  Patient Details Name: Matthew Mahoney MRN: [id number] DOB: Sep 10, 1936   Cancelled treatment:       Reason Eval/Treat Not Completed: Other (comment). Given transition to comfort measures, will sign off at this time.    Matthew Mahoney, Matthew Mahoney 2021-11-09, 9:19 AM

## 2021-11-06 NOTE — TOC Transition Note (Signed)
Transition of Care [facility]) - CM/SW Discharge Note   Patient Details  Name: Matthew Mahoney MRN: [id number] Date of Birth: July 27, 1936  [facility] of [facility]) CM/SW Contact:  Vinie Sill, LCSW Phone Number: 11-17-2021, 12:21 PM   Clinical Narrative:     Patient will Discharge to: [facility]  Discharge Date: 11-17-2021 Family Notified: Sister, LVM Transport By: Corey Harold   Per MD patient is ready for discharge. RN, patient, and facility notified of discharge. Discharge Summary sent to facility. RN given number for [phone]. Ambulance transport requested for patient.   Clinical Social Worker signing off.  [name], MSW, LCSW Clinical Social Worker     Final next level of care: [place] Barriers to Discharge: Barriers Resolved   Patient Goals and CMS Choice        Discharge Placement                       Discharge Plan and Services                                     Social Determinants of Health (SDOH) Interventions     Readmission Risk Interventions No flowsheet data found.

## 2021-11-06 DEATH — deceased

## 2021-12-23 ENCOUNTER — Ambulatory Visit: Payer: Medicare Other | Admitting: Cardiovascular Disease

## 2021-12-29 ENCOUNTER — Ambulatory Visit: Payer: Medicare Other | Admitting: Cardiovascular Disease
